# Patient Record
Sex: Male | Born: 1950 | Race: White | Hispanic: No | Marital: Single | Smoking: Former smoker
Health system: Southern US, Community
[De-identification: ages and names within clinical notes are randomized; demographics above are authoritative.]

## PROBLEM LIST (undated history)

## (undated) DIAGNOSIS — I96 Gangrene, not elsewhere classified: Secondary | ICD-10-CM

## (undated) DIAGNOSIS — E119 Type 2 diabetes mellitus without complications: Secondary | ICD-10-CM

## (undated) HISTORY — PX: KNEE SURGERY: SHX244

---

## 2000-03-08 ENCOUNTER — Emergency Department (HOSPITAL_COMMUNITY): Admission: EM | Admit: 2000-03-08 | Discharge: 2000-03-08 | Payer: Self-pay | Admitting: *Deleted

## 2015-01-23 ENCOUNTER — Emergency Department (HOSPITAL_COMMUNITY): Payer: PPO

## 2015-01-23 ENCOUNTER — Inpatient Hospital Stay (HOSPITAL_COMMUNITY): Payer: PPO

## 2015-01-23 ENCOUNTER — Inpatient Hospital Stay (HOSPITAL_COMMUNITY)
Admission: EM | Admit: 2015-01-23 | Discharge: 2015-02-15 | DRG: 216 | Disposition: A | Discharge status: Skilled Nursing Facility | Payer: PPO | Attending: Cardiothoracic Surgery | Admitting: Cardiothoracic Surgery

## 2015-01-23 ENCOUNTER — Encounter (HOSPITAL_COMMUNITY): Payer: Self-pay | Admitting: Pulmonary Disease

## 2015-01-23 DIAGNOSIS — J9589 Other postprocedural complications and disorders of respiratory system, not elsewhere classified: Secondary | ICD-10-CM | POA: Diagnosis not present

## 2015-01-23 DIAGNOSIS — E1165 Type 2 diabetes mellitus with hyperglycemia: Secondary | ICD-10-CM | POA: Diagnosis present

## 2015-01-23 DIAGNOSIS — I110 Hypertensive heart disease with heart failure: Secondary | ICD-10-CM | POA: Diagnosis present

## 2015-01-23 DIAGNOSIS — E119 Type 2 diabetes mellitus without complications: Secondary | ICD-10-CM | POA: Diagnosis not present

## 2015-01-23 DIAGNOSIS — R0602 Shortness of breath: Secondary | ICD-10-CM | POA: Diagnosis not present

## 2015-01-23 DIAGNOSIS — J9601 Acute respiratory failure with hypoxia: Secondary | ICD-10-CM | POA: Diagnosis present

## 2015-01-23 DIAGNOSIS — I255 Ischemic cardiomyopathy: Secondary | ICD-10-CM | POA: Diagnosis present

## 2015-01-23 DIAGNOSIS — Z951 Presence of aortocoronary bypass graft: Secondary | ICD-10-CM

## 2015-01-23 DIAGNOSIS — Z9119 Patient's noncompliance with other medical treatment and regimen: Secondary | ICD-10-CM | POA: Diagnosis not present

## 2015-01-23 DIAGNOSIS — Z87891 Personal history of nicotine dependence: Secondary | ICD-10-CM

## 2015-01-23 DIAGNOSIS — I509 Heart failure, unspecified: Secondary | ICD-10-CM

## 2015-01-23 DIAGNOSIS — I2510 Atherosclerotic heart disease of native coronary artery without angina pectoris: Secondary | ICD-10-CM | POA: Diagnosis present

## 2015-01-23 DIAGNOSIS — E872 Acidosis: Secondary | ICD-10-CM | POA: Diagnosis present

## 2015-01-23 DIAGNOSIS — R278 Other lack of coordination: Secondary | ICD-10-CM | POA: Diagnosis not present

## 2015-01-23 DIAGNOSIS — I130 Hypertensive heart and chronic kidney disease with heart failure and stage 1 through stage 4 chronic kidney disease, or unspecified chronic kidney disease: Principal | ICD-10-CM | POA: Diagnosis present

## 2015-01-23 DIAGNOSIS — E876 Hypokalemia: Secondary | ICD-10-CM | POA: Diagnosis present

## 2015-01-23 DIAGNOSIS — R339 Retention of urine, unspecified: Secondary | ICD-10-CM | POA: Diagnosis present

## 2015-01-23 DIAGNOSIS — R2681 Unsteadiness on feet: Secondary | ICD-10-CM | POA: Diagnosis not present

## 2015-01-23 DIAGNOSIS — J9602 Acute respiratory failure with hypercapnia: Secondary | ICD-10-CM | POA: Diagnosis not present

## 2015-01-23 DIAGNOSIS — J9811 Atelectasis: Secondary | ICD-10-CM | POA: Diagnosis not present

## 2015-01-23 DIAGNOSIS — I213 ST elevation (STEMI) myocardial infarction of unspecified site: Secondary | ICD-10-CM | POA: Diagnosis not present

## 2015-01-23 DIAGNOSIS — L97519 Non-pressure chronic ulcer of other part of right foot with unspecified severity: Secondary | ICD-10-CM | POA: Diagnosis present

## 2015-01-23 DIAGNOSIS — I5023 Acute on chronic systolic (congestive) heart failure: Secondary | ICD-10-CM | POA: Diagnosis present

## 2015-01-23 DIAGNOSIS — E1121 Type 2 diabetes mellitus with diabetic nephropathy: Secondary | ICD-10-CM | POA: Diagnosis present

## 2015-01-23 DIAGNOSIS — I214 Non-ST elevation (NSTEMI) myocardial infarction: Secondary | ICD-10-CM | POA: Diagnosis present

## 2015-01-23 DIAGNOSIS — E785 Hyperlipidemia, unspecified: Secondary | ICD-10-CM | POA: Diagnosis present

## 2015-01-23 DIAGNOSIS — E1122 Type 2 diabetes mellitus with diabetic chronic kidney disease: Secondary | ICD-10-CM | POA: Diagnosis present

## 2015-01-23 DIAGNOSIS — I10 Essential (primary) hypertension: Secondary | ICD-10-CM

## 2015-01-23 DIAGNOSIS — J810 Acute pulmonary edema: Secondary | ICD-10-CM

## 2015-01-23 DIAGNOSIS — R52 Pain, unspecified: Secondary | ICD-10-CM

## 2015-01-23 DIAGNOSIS — J9600 Acute respiratory failure, unspecified whether with hypoxia or hypercapnia: Secondary | ICD-10-CM | POA: Diagnosis not present

## 2015-01-23 DIAGNOSIS — I513 Intracardiac thrombosis, not elsewhere classified: Secondary | ICD-10-CM | POA: Diagnosis present

## 2015-01-23 DIAGNOSIS — M6281 Muscle weakness (generalized): Secondary | ICD-10-CM | POA: Diagnosis not present

## 2015-01-23 DIAGNOSIS — E11621 Type 2 diabetes mellitus with foot ulcer: Secondary | ICD-10-CM | POA: Diagnosis present

## 2015-01-23 DIAGNOSIS — E0821 Diabetes mellitus due to underlying condition with diabetic nephropathy: Secondary | ICD-10-CM | POA: Diagnosis not present

## 2015-01-23 DIAGNOSIS — I739 Peripheral vascular disease, unspecified: Secondary | ICD-10-CM

## 2015-01-23 DIAGNOSIS — N181 Chronic kidney disease, stage 1: Secondary | ICD-10-CM

## 2015-01-23 DIAGNOSIS — D62 Acute posthemorrhagic anemia: Secondary | ICD-10-CM | POA: Diagnosis not present

## 2015-01-23 DIAGNOSIS — N189 Chronic kidney disease, unspecified: Secondary | ICD-10-CM | POA: Diagnosis present

## 2015-01-23 DIAGNOSIS — I080 Rheumatic disorders of both mitral and aortic valves: Secondary | ICD-10-CM | POA: Diagnosis not present

## 2015-01-23 DIAGNOSIS — E1142 Type 2 diabetes mellitus with diabetic polyneuropathy: Secondary | ICD-10-CM | POA: Diagnosis present

## 2015-01-23 DIAGNOSIS — I472 Ventricular tachycardia: Secondary | ICD-10-CM | POA: Diagnosis not present

## 2015-01-23 DIAGNOSIS — I25110 Atherosclerotic heart disease of native coronary artery with unstable angina pectoris: Secondary | ICD-10-CM | POA: Diagnosis not present

## 2015-01-23 DIAGNOSIS — M79661 Pain in right lower leg: Secondary | ICD-10-CM | POA: Diagnosis not present

## 2015-01-23 DIAGNOSIS — I4901 Ventricular fibrillation: Secondary | ICD-10-CM | POA: Diagnosis not present

## 2015-01-23 DIAGNOSIS — E11622 Type 2 diabetes mellitus with other skin ulcer: Secondary | ICD-10-CM | POA: Diagnosis present

## 2015-01-23 DIAGNOSIS — I5030 Unspecified diastolic (congestive) heart failure: Secondary | ICD-10-CM | POA: Diagnosis not present

## 2015-01-23 DIAGNOSIS — L97529 Non-pressure chronic ulcer of other part of left foot with unspecified severity: Secondary | ICD-10-CM | POA: Diagnosis present

## 2015-01-23 DIAGNOSIS — Z48812 Encounter for surgical aftercare following surgery on the circulatory system: Secondary | ICD-10-CM | POA: Diagnosis not present

## 2015-01-23 DIAGNOSIS — I209 Angina pectoris, unspecified: Secondary | ICD-10-CM | POA: Diagnosis not present

## 2015-01-23 DIAGNOSIS — I5021 Acute systolic (congestive) heart failure: Secondary | ICD-10-CM | POA: Diagnosis present

## 2015-01-23 DIAGNOSIS — R079 Chest pain, unspecified: Secondary | ICD-10-CM

## 2015-01-23 DIAGNOSIS — I519 Heart disease, unspecified: Secondary | ICD-10-CM | POA: Diagnosis not present

## 2015-01-23 DIAGNOSIS — L0390 Cellulitis, unspecified: Secondary | ICD-10-CM | POA: Diagnosis not present

## 2015-01-23 HISTORY — DX: Type 2 diabetes mellitus without complications: E11.9

## 2015-01-23 HISTORY — DX: Gangrene, not elsewhere classified: I96

## 2015-01-23 LAB — URINALYSIS, ROUTINE W REFLEX MICROSCOPIC
BILIRUBIN URINE: NEGATIVE
HGB URINE DIPSTICK: NEGATIVE
KETONES UR: 15 mg/dL — AB
Leukocytes, UA: NEGATIVE
Nitrite: NEGATIVE
PROTEIN: NEGATIVE mg/dL
Specific Gravity, Urine: 1.03 (ref 1.005–1.030)
pH: 5 (ref 5.0–8.0)

## 2015-01-23 LAB — BASIC METABOLIC PANEL
Anion gap: 14 (ref 5–15)
BUN: 14 mg/dL (ref 6–20)
CO2: 20 mmol/L — ABNORMAL LOW (ref 22–32)
CREATININE: 1.35 mg/dL — AB (ref 0.61–1.24)
Calcium: 8.9 mg/dL (ref 8.9–10.3)
Chloride: 106 mmol/L (ref 101–111)
GFR calc Af Amer: >60 mL/min (ref >60)
GFR, EST NON AFRICAN AMERICAN: 53 mL/min — AB (ref >60)
GLUCOSE: 349 mg/dL — AB (ref 65–99)
POTASSIUM: 4.1 mmol/L (ref 3.5–5.1)
SODIUM: 140 mmol/L (ref 135–145)

## 2015-01-23 LAB — CBC WITH DIFFERENTIAL/PLATELET
BASOS PCT: 1 %
Basophils Absolute: 0.1 10*3/uL (ref 0.0–0.1)
EOS ABS: 0.2 10*3/uL (ref 0.0–0.7)
EOS PCT: 1 %
HCT: 45.1 % (ref 39.0–52.0)
HEMOGLOBIN: 15.4 g/dL (ref 13.0–17.0)
Lymphocytes Relative: 15 %
Lymphs Abs: 2 10*3/uL (ref 0.7–4.0)
MCH: 28.2 pg (ref 26.0–34.0)
MCHC: 34.1 g/dL (ref 30.0–36.0)
MCV: 82.4 fL (ref 78.0–100.0)
Monocytes Absolute: 0.5 10*3/uL (ref 0.1–1.0)
Monocytes Relative: 4 %
NEUTROS PCT: 79 %
Neutro Abs: 10.7 10*3/uL — ABNORMAL HIGH (ref 1.7–7.7)
PLATELETS: 254 10*3/uL (ref 150–400)
RBC: 5.47 MIL/uL (ref 4.22–5.81)
RDW: 13.4 % (ref 11.5–15.5)
WBC: 13.4 10*3/uL — AB (ref 4.0–10.5)

## 2015-01-23 LAB — URINE MICROSCOPIC-ADD ON

## 2015-01-23 LAB — HEPARIN LEVEL (UNFRACTIONATED)
HEPARIN UNFRACTIONATED: 0.79 [IU]/mL — AB (ref 0.30–0.70)
Heparin Unfractionated: 0.62 IU/mL (ref 0.30–0.70)

## 2015-01-23 LAB — MRSA PCR SCREENING: MRSA BY PCR: NEGATIVE

## 2015-01-23 LAB — COMPREHENSIVE METABOLIC PANEL WITH GFR
ALT: 18 U/L (ref 17–63)
AST: 21 U/L (ref 15–41)
Albumin: 3.9 g/dL (ref 3.5–5.0)
Alkaline Phosphatase: 72 U/L (ref 38–126)
Anion gap: 16 — ABNORMAL HIGH (ref 5–15)
BUN: 12 mg/dL (ref 6–20)
CO2: 19 mmol/L — ABNORMAL LOW (ref 22–32)
Calcium: 9.4 mg/dL (ref 8.9–10.3)
Chloride: 105 mmol/L (ref 101–111)
Creatinine, Ser: 1.38 mg/dL — ABNORMAL HIGH (ref 0.61–1.24)
GFR calc Af Amer: 60 mL/min — ABNORMAL LOW
GFR calc non Af Amer: 52 mL/min — ABNORMAL LOW
Glucose, Bld: 428 mg/dL — ABNORMAL HIGH (ref 65–99)
Potassium: 3.5 mmol/L (ref 3.5–5.1)
Sodium: 140 mmol/L (ref 135–145)
Total Bilirubin: 0.8 mg/dL (ref 0.3–1.2)
Total Protein: 7 g/dL (ref 6.5–8.1)

## 2015-01-23 LAB — CBC
HCT: 37.9 % — ABNORMAL LOW (ref 39.0–52.0)
HEMOGLOBIN: 13.3 g/dL (ref 13.0–17.0)
MCH: 28.3 pg (ref 26.0–34.0)
MCHC: 35.1 g/dL (ref 30.0–36.0)
MCV: 80.6 fL (ref 78.0–100.0)
PLATELETS: 234 10*3/uL (ref 150–400)
RBC: 4.7 MIL/uL (ref 4.22–5.81)
RDW: 13.2 % (ref 11.5–15.5)
WBC: 12.5 10*3/uL — AB (ref 4.0–10.5)

## 2015-01-23 LAB — TROPONIN I
Troponin I: 10.85 ng/mL (ref <0.031)
Troponin I: 28.64 ng/mL (ref <0.031)

## 2015-01-23 LAB — I-STAT CG4 LACTIC ACID, ED: LACTIC ACID, VENOUS: 5.34 mmol/L — AB (ref 0.5–2.0)

## 2015-01-23 LAB — I-STAT ARTERIAL BLOOD GAS, ED
ACID-BASE DEFICIT: 5 mmol/L — AB (ref 0.0–2.0)
Bicarbonate: 19.7 mEq/L — ABNORMAL LOW (ref 20.0–24.0)
O2 SAT: 100 %
PCO2 ART: 35.9 mmHg (ref 35.0–45.0)
PH ART: 7.348 — AB (ref 7.350–7.450)
PO2 ART: 304 mmHg — AB (ref 80.0–100.0)
Patient temperature: 98.6
TCO2: 21 mmol/L (ref 0–100)

## 2015-01-23 LAB — CREATININE, SERUM
CREATININE: 1.38 mg/dL — AB (ref 0.61–1.24)
GFR, EST AFRICAN AMERICAN: 60 mL/min — AB (ref >60)
GFR, EST NON AFRICAN AMERICAN: 52 mL/min — AB (ref >60)

## 2015-01-23 LAB — LACTIC ACID, PLASMA: LACTIC ACID, VENOUS: 3.1 mmol/L — AB (ref 0.5–2.0)

## 2015-01-23 LAB — GLUCOSE, CAPILLARY
GLUCOSE-CAPILLARY: 309 mg/dL — AB (ref 65–99)
GLUCOSE-CAPILLARY: 203 mg/dL — AB (ref 65–99)

## 2015-01-23 LAB — CBG MONITORING, ED
GLUCOSE-CAPILLARY: 393 mg/dL — AB (ref 65–99)
GLUCOSE-CAPILLARY: 347 mg/dL — AB (ref 65–99)

## 2015-01-23 LAB — I-STAT TROPONIN, ED: TROPONIN I, POC: 0.25 ng/mL — AB (ref 0.00–0.08)

## 2015-01-23 LAB — BRAIN NATRIURETIC PEPTIDE: B NATRIURETIC PEPTIDE 5: 395.7 pg/mL — AB (ref 0.0–100.0)

## 2015-01-23 LAB — TSH: TSH: 1.311 u[IU]/mL (ref 0.350–4.500)

## 2015-01-23 MED ORDER — SODIUM CHLORIDE 0.9 % IV SOLN: 250.0000 mL | INTRAVENOUS | Status: DC | PRN

## 2015-01-23 MED ORDER — ONDANSETRON HCL 4 MG/2ML IJ SOLN
4.0000 mg | Freq: Four times a day (QID) | INTRAMUSCULAR | Status: DC | PRN
  Filled 2015-01-23: qty 2

## 2015-01-23 MED ORDER — SODIUM CHLORIDE 0.9 % IV BOLUS (SEPSIS)
1000.0000 mL | Freq: Once | INTRAVENOUS | Status: AC
  Administered 2015-01-23: 1000 mL via INTRAVENOUS

## 2015-01-23 MED ORDER — NITROGLYCERIN IN D5W 200-5 MCG/ML-% IV SOLN
0.0000 ug/min | Freq: Once | INTRAVENOUS | Status: DC
Start: 2015-01-23 — End: 2015-01-23

## 2015-01-23 MED ORDER — IOHEXOL 350 MG/ML SOLN: 80.0000 mL | Freq: Once | INTRAVENOUS | Status: DC | PRN

## 2015-01-23 MED ORDER — DEXTROSE-NACL 5-0.45 % IV SOLN: INTRAVENOUS | Status: DC

## 2015-01-23 MED ORDER — ALBUTEROL SULFATE (2.5 MG/3ML) 0.083% IN NEBU
INHALATION_SOLUTION | RESPIRATORY_TRACT | Status: AC
  Administered 2015-01-23: 09:00:00
  Filled 2015-01-23: qty 6

## 2015-01-23 MED ORDER — ASPIRIN 81 MG PO CHEW: 324.0000 mg | CHEWABLE_TABLET | Freq: Once | ORAL | Status: DC

## 2015-01-23 MED ORDER — ASPIRIN 300 MG RE SUPP
300.0000 mg | Freq: Once | RECTAL | Status: DC
  Filled 2015-01-23: qty 1

## 2015-01-23 MED ORDER — METOPROLOL TARTRATE 25 MG PO TABS
25.0000 mg | ORAL_TABLET | Freq: Two times a day (BID) | ORAL | Status: DC
  Administered 2015-01-23 – 2015-01-27 (×9): 25 mg via ORAL
  Filled 2015-01-23 (×9): qty 1

## 2015-01-23 MED ORDER — HYDRALAZINE HCL 20 MG/ML IJ SOLN: 10.0000 mg | INTRAMUSCULAR | Status: DC | PRN

## 2015-01-23 MED ORDER — ASPIRIN 81 MG PO CHEW
324.0000 mg | CHEWABLE_TABLET | Freq: Every day | ORAL | Status: DC
  Administered 2015-01-24: 324 mg via ORAL
  Filled 2015-01-23: qty 4

## 2015-01-23 MED ORDER — FUROSEMIDE 10 MG/ML IJ SOLN
60.0000 mg | Freq: Four times a day (QID) | INTRAMUSCULAR | Status: AC
  Administered 2015-01-23 – 2015-01-24 (×2): 60 mg via INTRAVENOUS
  Filled 2015-01-23 (×2): qty 6

## 2015-01-23 MED ORDER — SODIUM CHLORIDE 0.9 % IV BOLUS (SEPSIS): 1000.0000 mL | Freq: Once | INTRAVENOUS | Status: DC

## 2015-01-23 MED ORDER — INSULIN ASPART 100 UNIT/ML ~~LOC~~ SOLN
10.0000 [IU] | Freq: Once | SUBCUTANEOUS | Status: AC
  Administered 2015-01-23: 10 [IU] via INTRAVENOUS
  Filled 2015-01-23: qty 1

## 2015-01-23 MED ORDER — NITROGLYCERIN 2 % TD OINT
0.5000 [in_us] | TOPICAL_OINTMENT | Freq: Once | TRANSDERMAL | Status: AC
  Administered 2015-01-23: 0.5 [in_us] via TOPICAL
  Filled 2015-01-23: qty 1

## 2015-01-23 MED ORDER — ACETAMINOPHEN 325 MG PO TABS: 650.0000 mg | ORAL_TABLET | ORAL | Status: DC | PRN

## 2015-01-23 MED ORDER — SODIUM CHLORIDE 0.9 % IV SOLN
INTRAVENOUS | Status: DC
  Filled 2015-01-23: qty 2.5

## 2015-01-23 MED ORDER — SODIUM CHLORIDE 0.9 % IV BOLUS (SEPSIS)
500.0000 mL | Freq: Once | INTRAVENOUS | Status: AC
  Administered 2015-01-23: 500 mL via INTRAVENOUS

## 2015-01-23 MED ORDER — HEPARIN BOLUS VIA INFUSION
5000.0000 [IU] | Freq: Once | INTRAVENOUS | Status: AC
  Administered 2015-01-23: 5000 [IU] via INTRAVENOUS
  Filled 2015-01-23: qty 5000

## 2015-01-23 MED ORDER — CETYLPYRIDINIUM CHLORIDE 0.05 % MT LIQD
7.0000 mL | Freq: Two times a day (BID) | OROMUCOSAL | Status: DC
  Administered 2015-01-24 – 2015-01-31 (×11): 7 mL via OROMUCOSAL

## 2015-01-23 MED ORDER — VANCOMYCIN HCL 10 G IV SOLR
2000.0000 mg | Freq: Once | INTRAVENOUS | Status: DC
  Administered 2015-01-23: 2000 mg via INTRAVENOUS
  Filled 2015-01-23: qty 2000

## 2015-01-23 MED ORDER — HEPARIN (PORCINE) IN NACL 100-0.45 UNIT/ML-% IJ SOLN
1550.0000 [IU]/h | INTRAMUSCULAR | Status: DC
  Administered 2015-01-23: 1500 [IU]/h via INTRAVENOUS
  Administered 2015-01-23 – 2015-01-24 (×2): 1350 [IU]/h via INTRAVENOUS
  Filled 2015-01-23 (×3): qty 250

## 2015-01-23 MED ORDER — ASPIRIN 300 MG RE SUPP: 300.0000 mg | Freq: Every day | RECTAL | Status: DC

## 2015-01-23 MED ORDER — FUROSEMIDE 10 MG/ML IJ SOLN
60.0000 mg | Freq: Once | INTRAMUSCULAR | Status: AC
  Administered 2015-01-23: 60 mg via INTRAVENOUS
  Filled 2015-01-23: qty 6

## 2015-01-23 MED ORDER — PIPERACILLIN-TAZOBACTAM 3.375 G IVPB 30 MIN
3.3750 g | Freq: Once | INTRAVENOUS | Status: AC
  Administered 2015-01-23: 3.375 g via INTRAVENOUS
  Filled 2015-01-23: qty 50

## 2015-01-23 MED ORDER — INSULIN ASPART 100 UNIT/ML ~~LOC~~ SOLN
0.0000 [IU] | SUBCUTANEOUS | Status: DC
  Administered 2015-01-23: 5 [IU] via SUBCUTANEOUS
  Administered 2015-01-23: 11 [IU] via SUBCUTANEOUS
  Administered 2015-01-24: 2 [IU] via SUBCUTANEOUS
  Administered 2015-01-24 (×4): 3 [IU] via SUBCUTANEOUS
  Administered 2015-01-24: 8 [IU] via SUBCUTANEOUS
  Administered 2015-01-25: 3 [IU] via SUBCUTANEOUS
  Administered 2015-01-25: 5 [IU] via SUBCUTANEOUS
  Administered 2015-01-25 – 2015-01-26 (×4): 2 [IU] via SUBCUTANEOUS
  Administered 2015-01-26: 5 [IU] via SUBCUTANEOUS
  Administered 2015-01-26 (×2): 3 [IU] via SUBCUTANEOUS
  Administered 2015-01-26: 5 [IU] via SUBCUTANEOUS
  Administered 2015-01-27 – 2015-01-28 (×6): 3 [IU] via SUBCUTANEOUS
  Administered 2015-01-29: 8 [IU] via SUBCUTANEOUS
  Administered 2015-01-29 (×3): 3 [IU] via SUBCUTANEOUS
  Administered 2015-01-30: 5 [IU] via SUBCUTANEOUS
  Administered 2015-01-30: 2 [IU] via SUBCUTANEOUS
  Administered 2015-01-30: 3 [IU] via SUBCUTANEOUS
  Administered 2015-01-30: 2 [IU] via SUBCUTANEOUS
  Administered 2015-01-30 – 2015-01-31 (×2): 3 [IU] via SUBCUTANEOUS
  Administered 2015-01-31: 5 [IU] via SUBCUTANEOUS
  Administered 2015-01-31 (×2): 2 [IU] via SUBCUTANEOUS
  Administered 2015-01-31: 3 [IU] via SUBCUTANEOUS

## 2015-01-23 MED ORDER — ENOXAPARIN SODIUM 40 MG/0.4ML ~~LOC~~ SOLN
40.0000 mg | SUBCUTANEOUS | Status: DC
Start: 2015-01-23 — End: 2015-01-23

## 2015-01-23 MED ORDER — ATORVASTATIN CALCIUM 80 MG PO TABS
80.0000 mg | ORAL_TABLET | Freq: Every day | ORAL | Status: DC
  Administered 2015-01-24 – 2015-02-14 (×20): 80 mg via ORAL
  Filled 2015-01-23 (×21): qty 1

## 2015-01-23 NOTE — Progress Notes (Signed)
  Echocardiogram 2D Echocardiogram has been performed.  Bradley Benitez, Bradley Benitez R 01/23/2015, 2:00 PM

## 2015-01-23 NOTE — Progress Notes (Addendum)
ANTICOAGULATION CONSULT NOTE - Follow Up Consult  Pharmacy Consult for Heparin Indication: rule out PE  Allergies  Allergen Reactions  . Codeine Other (See Comments)    intolerance    Patient Measurements: Height: 6\' 1"  (185.4 cm) Weight: 227 lb 11.8 oz (103.3 kg) IBW/kg (Calculated) : 79.9 Heparin Dosing Weight: ~100kg  Vital Signs: Temp: 97.4 F (36.3 C) (12/28 1300) Temp Source: Oral (12/28 1300) BP: 123/76 mmHg (12/28 1500) Pulse Rate: 113 (12/28 1500)  Labs:  Recent Labs  01/23/15 0842 01/23/15 1410 01/23/15 1545  HGB 15.4 13.3  --   HCT 45.1 37.9*  --   PLT 254 234  --   HEPARINUNFRC  --   --  0.62  CREATININE 1.38* 1.38*  --   TROPONINI  --  10.85*  --     Estimated Creatinine Clearance: 66.5 mL/min (by C-G formula based on Cr of 1.38).   Medications:  Heparin @ 1500 units/hr  Assessment: 64yom started on IV heparin earlier today for possible PE. Initial heparin level is therapeutic at 0.62. No bleeding reported.  Goal of Therapy:  Heparin level 0.3-0.7 units/ml Monitor platelets by anticoagulation protocol: Yes   Plan:  1) Continue heparin at 1500 units/hr 2) Check 6 hour heparin level to confirm  [name], Arria Naim Sue 01/23/2015,4:43 PM   Addendum: Confirmatory heparin level is above goal at 0.79. Decrease heparin to 1350 units/hr and follow up heparin level in 6 hours.  [name], Leelah Hanna Sue 01/23/2015, 10:30 PM

## 2015-01-23 NOTE — Progress Notes (Signed)
RT transported pt to 2H04 from ED. No complications. Vital signs stable. Patient tolerated well. Report to RT given. RN at bedside. RT will continue to monitor.

## 2015-01-23 NOTE — ED Notes (Signed)
Resp distress- x2 two weeks worsening this morning 4am. EMS reports pt lungs diminished. O2 90% on 4L [place]. Pt profusely diaphoretic. Pt resp status worseded- CPAP= did not tolerate. Placed pt on nonrebreather. Denies CP. HR 130, unable to obtain BP, resp 30. 85% on nonrebreather

## 2015-01-23 NOTE — Progress Notes (Signed)
Utilization Review Completed.Kamalani Mastro T12/28/2016  

## 2015-01-23 NOTE — ED Provider Notes (Signed)
CSN: [id number]     Arrival date & time 01/23/15  0831 History   First MD Initiated Contact with Patient 01/23/15 [phone]     Chief Complaint  Patient presents with  . Shortness of Breath     (Consider location/radiation/quality/duration/timing/severity/associated sxs/prior Treatment) Patient is a 64 y.o. male presenting with shortness of breath. The history is provided by the patient.  Shortness of Breath Severity:  Severe Onset quality:  Gradual Duration:  2 weeks Timing:  Constant Progression:  Worsening Chronicity:  New Context: activity   Relieved by:  Nothing Worsened by:  Nothing tried Ineffective treatments:  None tried Associated symptoms: cough   Associated symptoms: no chest pain and no fever   Associated symptoms comment:  Leg swelling Risk factors comment:  No prior healthcare recently   Past Medical History  Diagnosis Date  . DM2 (diabetes mellitus, type 2) (HCC)    No past surgical history on file. No family history on file. Social History  Substance Use Topics  . Smoking status: Not on file  . Smokeless tobacco: Not on file  . Alcohol Use: Not on file    Review of Systems  Constitutional: Negative for fever.  Respiratory: Positive for cough and shortness of breath.   Cardiovascular: Negative for chest pain.  All other systems reviewed and are negative.     Allergies  Codeine  Home Medications   Prior to Admission medications   Medication Sig Start Date End Date Taking? Authorizing Provider  aspirin 325 MG tablet Take 325 mg by mouth every 6 (six) hours as needed for mild pain.   Yes Historical Provider, MD   BP 158/98 mmHg  Pulse 121  Temp(Src) 97.7 F (36.5 C) (Rectal)  Resp 22  Wt 234 lb 6.4 oz (106.323 kg)  SpO2 100% Physical Exam  Constitutional: He is oriented to person, place, and time. He appears well-developed. He appears toxic. He appears distressed. Face mask in place.  HENT:  Head: Normocephalic and atraumatic.  Eyes:  Conjunctivae are normal.  Neck: Neck supple. No tracheal deviation present.  Cardiovascular: Regular rhythm and normal heart sounds.  Tachycardia present.   4+ b/l LE pitting edema  Pulmonary/Chest: Accessory muscle usage present. Tachypnea noted. He is in respiratory distress. He has rales.  Abdominal: Soft. He exhibits no distension.  Neurological: He is alert and oriented to person, place, and time.  Skin: Skin is warm. He is diaphoretic.  Psychiatric: He has a normal mood and affect.    ED Course  Procedures (including critical care time)  CRITICAL CARE Performed by: Lyndal Pulley Total critical care time: 30 minutes Critical care time was exclusive of separately billable procedures and treating other patients. Critical care was necessary to treat or prevent imminent or life-threatening deterioration. Critical care was time spent personally by me on the following activities: development of treatment plan with patient and/or surrogate as well as nursing, discussions with consultants, evaluation of patient's response to treatment, examination of patient, obtaining history from patient or surrogate, ordering and performing treatments and interventions, ordering and review of laboratory studies, ordering and review of radiographic studies, pulse oximetry and re-evaluation of patient's condition.  Emergency Focused Ultrasound Exam Limited Ultrasound of Lower Extremity for DVT  Performed and interpreted by Dr. Clydene Pugh Indication: Leg swelling Transverse views of bilaterl lower extremities are obtained in real time for the purposes of evaluation for deep venous thrombosis.  Findings: collapsible proximal femoral vein,  collapsible popliteal vein bilaterally Interpretation: no deep venous thrombosis Images  archived electronically.  CPT Code:   [id number]  Emergency Focused Ultrasound Exam Limited Ultrasound of the Heart and Pericardium  Performed and interpreted by Dr. Clydene PughKnott Indication:  shortness of breath Multiple views of the heart, pericardium, and IVC are obtained with a multi frequency probe.  Findings: decreased contractility, small anechoic fluid, no IVC collapse Interpretation: depressed ejection fraction, small pericardial effusion, elevated CVP Images archived electronically.  CPT Code: [id number]  Labs Review Labs Reviewed  CBC WITH DIFFERENTIAL/PLATELET - Abnormal; Notable for the following:    WBC 13.4 (*)    Neutro Abs 10.7 (*)    All other components within normal limits  BRAIN NATRIURETIC PEPTIDE - Abnormal; Notable for the following:    B Natriuretic Peptide 395.7 (*)    All other components within normal limits  COMPREHENSIVE METABOLIC PANEL - Abnormal; Notable for the following:    CO2 19 (*)    Glucose, Bld 428 (*)    Creatinine, Ser 1.38 (*)    GFR calc non Af Amer 52 (*)    GFR calc Af Amer 60 (*)    Anion gap 16 (*)    All other components within normal limits  URINALYSIS, ROUTINE W REFLEX MICROSCOPIC (NOT AT [facility]) - Abnormal; Notable for the following:    Glucose, UA >1000 (*)    Ketones, ur 15 (*)    All other components within normal limits  URINE MICROSCOPIC-ADD ON - Abnormal; Notable for the following:    Squamous Epithelial / LPF 0-5 (*)    Bacteria, UA RARE (*)    Casts HYALINE CASTS (*)    All other components within normal limits  I-STAT TROPOININ, ED - Abnormal; Notable for the following:    Troponin i, poc 0.25 (*)    All other components within normal limits  I-STAT ARTERIAL BLOOD GAS, ED - Abnormal; Notable for the following:    pH, Arterial 7.348 (*)    pO2, Arterial 304.0 (*)    Bicarbonate 19.7 (*)    Acid-base deficit 5.0 (*)    All other components within normal limits  I-STAT CG4 LACTIC ACID, ED - Abnormal; Notable for the following:    Lactic Acid, Venous 5.34 (*)    All other components within normal limits  CBG MONITORING, ED - Abnormal; Notable for the following:    Glucose-Capillary 393 (*)    All other  components within normal limits  URINE CULTURE  CULTURE, BLOOD (ROUTINE X 2)  CULTURE, BLOOD (ROUTINE X 2)  HEPARIN LEVEL (UNFRACTIONATED)    Imaging Review Dg Chest Port 1 View  01/23/2015  CLINICAL DATA:  Congestive heart failure EXAM: PORTABLE CHEST 1 VIEW COMPARISON:  None. FINDINGS: There is generalized interstitial edema with patchy alveolar edema in the bases. Heart is enlarged with pulmonary venous hypertension. No adenopathy. IMPRESSION: Evidence of congestive heart failure with edema most pronounced in the lung bases. Electronically Signed   By: Bretta BangWilliam  Woodruff III M.D.   On: 01/23/2015 09:34   I have personally reviewed and evaluated these images and lab results as part of my medical decision-making.   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 He was hypoxemic on arrival of EMS. Would not tolerate BiPAP, came in profusely diaphoretic and laboring to breathe on nonrebreather mask. Placed on BiPAP here with improvement. Patient did not have refractory hypotension after being placed on BiPAP, persistent tachycardia and shortness of breath with improvement of his work of breathing with mechanical assistance. Patient is acidotic, has elevated lactic acid, elevated BNP and troponin. No acute ischemic findings on EKG. Placed on aspirin and heparin for possible ACS. Patient does  have bilateral lower extremity swelling and 4+ edema concerning for congestive heart failure versus thrombotic event. Considered pulmonary embolus and CT scan ordered for pulmonary embolus evaluation. Patient does have hyperglycemia, ketones in urine, anion gap, metabolic acidosis, cannot rule out mild DKA so started on an insulin infusion to help slowly correct glucose.  BNP is elevated and chest x-ray demonstrates bibasilar interstitial edema that is concerning for atypical infection versus congestive heart failure. Provided empiric antibiotics with elevation of white blood cell count and tachycardia. Discussed case with hospitalist for stepdown admission with patient requiring ongoing BiPAP and they requested critical care consultation. Critical care saw patient in the emergency department and will admit for further workup and monitoring.   Lyndal Pulley, MD 01/23/15 [phone]

## 2015-01-23 NOTE — ED Notes (Signed)
CTA canceled per verbal order CCMD
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
## 2015-01-23 NOTE — Progress Notes (Signed)
RT contacted MD concerning Bipap leak due to pt's beard. MD requested to try high flow nasal cannula.

## 2015-01-23 NOTE — H&P (Addendum)
PULMONARY / CRITICAL CARE MEDICINE   Name: Bradley PilaDavid M Benitez MRN: [id number] DOB: November 23, 1950    ADMISSION DATE:  01/23/2015 CONSULTATION DATE:  01/23/2015  REFERRING MD:  Clydene PughKnott  CHIEF COMPLAINT:  Dyspnea  HISTORY OF PRESENT ILLNESS:   64 y/o male with diabetes but currently does not go to the doctor or take any medications [facility] on 01/23/2015 complaining of shortness of breath. He states that for the last 2 weeks she's been having increasing dyspnea on exertion and orthopnea. He notes chronic leg swelling which she does not think has changed significantly in the last few weeks. 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 He currently takes no medications. He said that he had a cough the morning of 01/23/2015 which was productive of a milky appearing mucus. He denies fevers, chills, or sick contacts. He does note having some chest tightness in the last 2 weeks as well which was associated with his shortness of breath. In the emergency department he was noted to be profoundly hypoxemic, requiring BiPAP for respiratory assistance, he had an elevated lactic acid. He was given antibiotics for presumed sepsis.  PAST MEDICAL HISTORY :  He  has a past medical history of DM2 (diabetes mellitus, type 2) (HCC).  PAST SURGICAL HISTORY: He  has no past surgical history on file.  Allergies  Allergen Reactions  . Codeine Other (See Comments)    intolerance    No current facility-administered medications on file prior to encounter.   No current outpatient prescriptions on file prior to encounter.    FAMILY HISTORY:  His has no family status information on file.   SOCIAL HISTORY: He  reports that he quit smoking about 21 years ago. His smoking use included Cigarettes. He has a 60 pack-year smoking history. He does not have any smokeless tobacco history on file. He reports that he uses  illicit drugs. He reports that he does not drink alcohol.  REVIEW OF SYSTEMS:   Gen: Denies fever, chills, weight change, fatigue, night sweats HEENT: Denies blurred vision, double vision, hearing loss, tinnitus, sinus congestion, rhinorrhea, sore throat, neck stiffness, dysphagia PULM: per HPI CV: per HPI GI: Denies abdominal pain, nausea, vomiting, diarrhea, hematochezia, melena, constipation, change in bowel habits GU: Denies dysuria, hematuria, polyuria, oliguria, urethral discharge Endocrine: Denies hot or cold intolerance, polyuria, polyphagia or appetite change Derm: Denies rash, dry skin, scaling or peeling skin change Heme: Denies easy bruising, bleeding, bleeding gums Neuro: Denies headache, numbness, weakness, slurred speech, loss of memory or consciousness   SUBJECTIVE:  As above  VITAL SIGNS: BP 158/98 mmHg  Pulse 121  Temp(Src) 97.7 F (36.5 C) (Rectal)  Resp 22  Wt 106.323 kg (234 lb 6.4 oz)  SpO2 100%  HEMODYNAMICS:    VENTILATOR SETTINGS: Vent Mode:  [-] BIPAP;PCV FiO2 (%):  [40 %] 40 % Set Rate:  [10 bmp] 10 bmp PEEP:  [5 cmH20] 5 cmH20  INTAKE / OUTPUT:    PHYSICAL EXAMINATION: General:  Chronically ill-appearing, mild respiratory distress Neuro:  Awake alert, oriented 4, moves all 4 extremities HEENT:  Normocephalic atraumatic, oropharynx clear, extraocular movements intact Cardiovascular:  Tachycardic, regular rate and rhythm, S3 noted, JVD noted Lungs:  Rales in bases, increased respiratory effort, no wheezing Abdomen:  Bowel sounds positive, soft, nontender Musculoskeletal:  Normal bulk and tone Skin:  Pitting edema to the knees, cool to touch, mildly cyanotic in the  toes, chronic ulcerations noted in both feet  LABS:  BMET  Recent Labs Lab 01/23/15 0842  NA 140  K 3.5  CL 105  CO2 19*  BUN 12  CREATININE 1.38*  GLUCOSE 428*    Electrolytes  Recent Labs Lab 01/23/15 0842  CALCIUM 9.4    CBC  Recent Labs Lab  01/23/15 0842  WBC 13.4*  HGB 15.4  HCT 45.1  PLT 254    Coag's No results for input(s): APTT, INR in the last 168 hours.  Sepsis Markers  Recent Labs Lab 01/23/15 0919  LATICACIDVEN 5.34*    ABG  Recent Labs Lab 01/23/15 0905  PHART 7.348*  PCO2ART 35.9  PO2ART 304.0*    Liver Enzymes  Recent Labs Lab 01/23/15 0842  AST 21  ALT 18  ALKPHOS 72  BILITOT 0.8  ALBUMIN 3.9    Cardiac Enzymes No results for input(s): TROPONINI, PROBNP in the last 168 hours.  Glucose  Recent Labs Lab 01/23/15 1106  GLUCAP 393*    Imaging Dg Chest Port 1 View  01/23/2015  CLINICAL DATA:  Congestive heart failure EXAM: PORTABLE CHEST 1 VIEW COMPARISON:  None. FINDINGS: There is generalized interstitial edema with patchy alveolar edema in the bases. Heart is enlarged with pulmonary venous hypertension. No adenopathy. IMPRESSION: Evidence of congestive heart failure with edema most pronounced in the lung bases. Electronically Signed   By: Bretta Bang III M.D.   On: 01/23/2015 09:34     STUDIES:  01/23/2015 echocardiogram  CULTURES: 01/23/2015 blood culture 01/23/2015 urine culture   ANTIBIOTICS: 01/23/2015 vancomycin 1 01/23/2015 Zosyn 1  SIGNIFICANT EVENTS: 01/23/2015 admission to [facility], BiPAP, cardiology consult  LINES/TUBES: 01/23/2015 BiPAP  DISCUSSION: This is a 64 year old male with a past medical history significant for diabetes mellitus he takes no medications and does not go to the doctor's office who presents with acute hypoxemic respiratory failure in the setting of significant leg swelling, progressive dyspnea, and orthopnea. He had an elevated lactic acid in the emergency department which is likely representative of poor perfusion in the setting of congestive heart failure. The most likely explanation for this picture is congestive heart failure, less likely would be the consideration of an occult infection. At this time I see no  indication for antibiotics. He also has profound hyperglycemia which is uncontrolled secondary to noncompliance with medical therapy.  ASSESSMENT / PLAN:  PULMONARY A: Acute hypoxemic respiratory failure secondary to acute pulmonary edema in the setting of very likely congestive heart failure P:   BiPAP as needed Monitor O2 saturations, keep above 90% Diuresis now  CARDIOVASCULAR A:  Acute congestive heart failure, systolic function is unknown Hypertension, currently untreated Sinus tachycardia Chest pain > non-specific T wave changes on EKG, he is high risk for ischemia given smoking history, age, and diabetes P:  Aspirin now Continue heparin gtt for now Serial troponin, stat echocardiogram Telemetry monitoring Serial EKG Cardiology consult nail Lasix now When necessary hydralazine to maintain systolic blood pressure less than 160 Nitropaste to chest now, consider nitroglycerin drip  RENAL A:   Presumed chronic kidney disease, uncertain baseline P:   Monitor BMET and UOP Replace electrolytes as needed   GASTROINTESTINAL A:   No acute issues P:   Nothing by mouth while on BiPAP  HEMATOLOGIC A:   No acute issues  P:  Monitor for bleeding  INFECTIOUS A:   Given antibiotics in the emergency department, no clear infection based on my history and physical exam P:  Hold further antibiotics Monitor for fever  ENDOCRINE A:   Hyperglycemia, poorly controlled   P:   Check hemoglobin A1c 10 units IV insulin now Sliding scale insulin for now Check TSH  NEUROLOGIC A:   Neuropathic pain secondary to poorly controlled diabetes P:   Consider wound care consult for foot ulcerations    FAMILY  - Updates: Family (niece, sister in law) updated in ER   My cc time 45 minutes   Heber Odessa, MD [facility] Pager: [phone] Cell: [phone] After 3pm or if no response, call [phone]   01/23/2015, 12:02 PM

## 2015-01-23 NOTE — ED Notes (Signed)
Pt on Bipap- tolerating well

## 2015-01-23 NOTE — Progress Notes (Signed)
ANTICOAGULATION CONSULT NOTE - Initial Consult  Pharmacy Consult for heparin Indication: rule out PE  Allergies  Allergen Reactions  . Codeine     Patient Measurements: Patient unable to report weight, estimated to be ~100 kg Height estimated to be ~6'4"  Vital Signs: BP: 152/100 mmHg (12/28 0903) Pulse Rate: 126 (12/28 0903)  Labs:  Recent Labs  01/23/15 0842  HGB 15.4  HCT 45.1  PLT 254  CREATININE 1.38*    CrCl cannot be calculated (Unknown ideal weight.).   Medical History: No past medical history on file.  Assessment: [name] presents to ED in respiratory distress. Patient unable to report weight, estimated to be ~ 100 kg. Pharmacy consulted to begin heparin for possible pulmonary embolism. Patient reports no medications prior to admission.  CT pending. Patient currently on BiPAP. Will use lower end of dosing range due to patient's unconfirmed weight. Will follow heparin levels closely.  Goal of Therapy:  Heparin level 0.3-0.7 units/ml Monitor platelets by anticoagulation protocol: Yes   Plan:  Give 5000 units bolus x 1 Start heparin infusion at 1500 units/hr Check anti-Xa level in 6 hours and daily while on heparin Continue to monitor H&H and platelets  Bradley Benitez, PharmD. PGY-1 Pharmacy Resident Pager: [phone] 01/23/2015,10:11 AM

## 2015-01-24 DIAGNOSIS — J9601 Acute respiratory failure with hypoxia: Secondary | ICD-10-CM

## 2015-01-24 DIAGNOSIS — I214 Non-ST elevation (NSTEMI) myocardial infarction: Secondary | ICD-10-CM

## 2015-01-24 DIAGNOSIS — I110 Hypertensive heart disease with heart failure: Secondary | ICD-10-CM

## 2015-01-24 DIAGNOSIS — I5021 Acute systolic (congestive) heart failure: Secondary | ICD-10-CM

## 2015-01-24 DIAGNOSIS — I213 ST elevation (STEMI) myocardial infarction of unspecified site: Secondary | ICD-10-CM

## 2015-01-24 LAB — CBC
HEMATOCRIT: 36.6 % — AB (ref 39.0–52.0)
HEMOGLOBIN: 13.1 g/dL (ref 13.0–17.0)
MCH: 29 pg (ref 26.0–34.0)
MCHC: 35.8 g/dL (ref 30.0–36.0)
MCV: 81.2 fL (ref 78.0–100.0)
Platelets: 240 10*3/uL (ref 150–400)
RBC: 4.51 MIL/uL (ref 4.22–5.81)
RDW: 13.4 % (ref 11.5–15.5)
WBC: 13.9 10*3/uL — AB (ref 4.0–10.5)

## 2015-01-24 LAB — BASIC METABOLIC PANEL
ANION GAP: 10 (ref 5–15)
BUN: 15 mg/dL (ref 6–20)
CO2: 24 mmol/L (ref 22–32)
Calcium: 8.5 mg/dL — ABNORMAL LOW (ref 8.9–10.3)
Chloride: 107 mmol/L (ref 101–111)
Creatinine, Ser: 1.3 mg/dL — ABNORMAL HIGH (ref 0.61–1.24)
GFR calc Af Amer: >60 mL/min (ref >60)
GFR calc non Af Amer: 56 mL/min — ABNORMAL LOW (ref >60)
GLUCOSE: 159 mg/dL — AB (ref 65–99)
POTASSIUM: 3.3 mmol/L — AB (ref 3.5–5.1)
Sodium: 141 mmol/L (ref 135–145)

## 2015-01-24 LAB — GLUCOSE, CAPILLARY
GLUCOSE-CAPILLARY: 131 mg/dL — AB (ref 65–99)
GLUCOSE-CAPILLARY: 162 mg/dL — AB (ref 65–99)
GLUCOSE-CAPILLARY: 186 mg/dL — AB (ref 65–99)
GLUCOSE-CAPILLARY: 263 mg/dL — AB (ref 65–99)
Glucose-Capillary: 171 mg/dL — ABNORMAL HIGH (ref 65–99)
Glucose-Capillary: 160 mg/dL — ABNORMAL HIGH (ref 65–99)

## 2015-01-24 LAB — URINE CULTURE: CULTURE: NO GROWTH

## 2015-01-24 LAB — PHOSPHORUS: Phosphorus: 3.9 mg/dL (ref 2.5–4.6)

## 2015-01-24 LAB — HEPARIN LEVEL (UNFRACTIONATED): HEPARIN UNFRACTIONATED: 0.56 [IU]/mL (ref 0.30–0.70)

## 2015-01-24 LAB — LIPID PANEL
CHOLESTEROL: 157 mg/dL (ref 0–200)
HDL: 34 mg/dL — AB (ref >40)
LDL CALC: 110 mg/dL — AB (ref 0–99)
TRIGLYCERIDES: 66 mg/dL (ref <150)
Total CHOL/HDL Ratio: 4.6 RATIO
VLDL: 13 mg/dL (ref 0–40)

## 2015-01-24 LAB — HEMOGLOBIN A1C
Hgb A1c MFr Bld: 11.7 % — ABNORMAL HIGH (ref 4.8–5.6)
MEAN PLASMA GLUCOSE: 289 mg/dL

## 2015-01-24 LAB — TROPONIN I: Troponin I: 26.2 ng/mL (ref <0.031)

## 2015-01-24 LAB — BRAIN NATRIURETIC PEPTIDE: B Natriuretic Peptide: 909.6 pg/mL — ABNORMAL HIGH (ref 0.0–100.0)

## 2015-01-24 LAB — MAGNESIUM: Magnesium: 1.7 mg/dL (ref 1.7–2.4)

## 2015-01-24 MED ORDER — NITROGLYCERIN IN D5W 200-5 MCG/ML-% IV SOLN
2.0000 ug/min | INTRAVENOUS | Status: DC
  Administered 2015-01-24 – 2015-01-28 (×2): 5 ug/min via INTRAVENOUS
  Filled 2015-01-24 (×2): qty 250

## 2015-01-24 MED ORDER — TRAZODONE HCL 50 MG PO TABS
100.0000 mg | ORAL_TABLET | Freq: Every evening | ORAL | Status: DC | PRN
  Administered 2015-01-28 – 2015-01-31 (×2): 100 mg via ORAL
  Filled 2015-01-24 (×2): qty 2

## 2015-01-24 MED ORDER — LIVING WELL WITH DIABETES BOOK
Freq: Once | Status: AC
  Administered 2015-01-24: 15:00:00
  Filled 2015-01-24: qty 1

## 2015-01-24 MED ORDER — INSULIN GLARGINE 100 UNIT/ML ~~LOC~~ SOLN
10.0000 [IU] | Freq: Every day | SUBCUTANEOUS | Status: DC
  Administered 2015-01-24: 10 [IU] via SUBCUTANEOUS
  Filled 2015-01-24 (×2): qty 0.1

## 2015-01-24 MED ORDER — FUROSEMIDE 10 MG/ML IJ SOLN
40.0000 mg | Freq: Four times a day (QID) | INTRAMUSCULAR | Status: AC
  Administered 2015-01-24 (×2): 40 mg via INTRAVENOUS
  Filled 2015-01-24 (×2): qty 4

## 2015-01-24 MED ORDER — POTASSIUM CHLORIDE CRYS ER 20 MEQ PO TBCR
40.0000 meq | EXTENDED_RELEASE_TABLET | Freq: Once | ORAL | Status: AC
  Administered 2015-01-24: 40 meq via ORAL
  Filled 2015-01-24: qty 2

## 2015-01-24 NOTE — Progress Notes (Addendum)
Inpatient Diabetes Program Recommendations  AACE/ADA: New Consensus Statement on Inpatient Glycemic Control (2015)  Target Ranges:  Prepandial:   less than 140 mg/dL      Peak postprandial:   less than 180 mg/dL (1-2 hours)      Critically ill patients:  140 - 180 mg/dL   Review of Glycemic Control  Diabetes history: DM2 Outpatient Diabetes medications: None Current orders for Inpatient glycemic control: Lantus 10 units QD, Novolog moderate Q4H  Results for Oliver PilaOLDHAM, Cayleb M (MRN [id number]) as of 01/24/2015 13:20  Ref. Range 01/23/2015 14:10  Hemoglobin A1C Latest Ref Range: 4.8-5.6 % 11.7 (H)   Results for Oliver PilaOLDHAM, Abdou M (MRN [id number]) as of 01/24/2015 13:20  Ref. Range 01/23/2015 17:17 01/23/2015 20:04 01/23/2015 23:44 01/24/2015 08:32 01/24/2015 12:36  Glucose-Capillary Latest Ref Range: 65-99 mg/dL 295309 (H) 284203 (H) 132131 (H) 162 (H) 186 (H)   Inpatient Diabetes Program Recommendations:  Insulin - Basal: Increase Lantus 15 units QD Correction (SSI): Novolog moderate Q4H HgbA1C: 11.7% - uncontrolled   Will order Living Well With Diabetes book and diabetes videos on pt ed channel.  Will follow. Thank you. Ailene Ardshonda Daisey Caloca, RD, LDN, CDE Inpatient Diabetes Coordinator [phone]  Addendum: Spoke with pt regarding his diabetes diagnosis. Has not been to a doctor in 30 years. States "I figured I had diabetes since everyone in my family has it." Discussed HgbA1C results and importance of keeping blood sugars in control. Discussed insulin at home and diet. Encouraged pt to view diabetes videos on pt ed channel. Will f/u in am.

## 2015-01-24 NOTE — Progress Notes (Signed)
MD paged regarding trend of Troponins. Patient reports no chest pain. Will continue to monitor.

## 2015-01-24 NOTE — Progress Notes (Signed)
ANTICOAGULATION CONSULT NOTE - Follow Up Consult  Pharmacy Consult for heparin Indication: rule out PE  Allergies  Allergen Reactions  . Codeine Other (See Comments)    intolerance    Patient Measurements: Height: 6\' 1"  (185.4 cm) Weight: 221 lb 1.9 oz (100.3 kg) IBW/kg (Calculated) : 79.9 Heparin Dosing Weight: 100 kg  Vital Signs: Temp: 97.8 F (36.6 C) (12/29 0409) Temp Source: Oral (12/29 0409) BP: 108/70 mmHg (12/29 0759) Pulse Rate: 96 (12/29 0759)  Labs:  Recent Labs  01/23/15 0842 01/23/15 1410 01/23/15 1545 01/23/15 1720 01/23/15 1917 01/23/15 2200 01/24/15 0027  HGB 15.4 13.3  --   --   --   --  13.1  HCT 45.1 37.9*  --   --   --   --  36.6*  PLT 254 234  --   --   --   --  240  HEPARINUNFRC  --   --  0.62  --   --  0.79* 0.56  CREATININE 1.38* 1.38*  --  1.35*  --   --  1.30*  TROPONINI  --  10.85*  --   --  28.64*  --  26.20*    Estimated Creatinine Clearance: 69.7 mL/min (by C-G formula based on Cr of 1.3).   Medications:  Heparin @ 1350 units/hr  Assessment:  64 y/o w/ PMH of DM on 01/23/2015 started on IV heparin for possible PE. Initially therapeutic on 1500 units/hr but became supra-therapeutic. Dose has been decreased to 1350 units/hr -HL 0.56, hgb 13.1, plts 240  Goal of Therapy:  Heparin level 0.3-0.7 units/ml Monitor platelets by anticoagulation protocol: Yes   Plan:  Continue heparin gtt at 1350 units/hr Daily HL, CBC Monitor for S&S of bleed  Sandi CarneNick Chrishawn Kring, PharmD Pharmacy Resident Pager: [phone] 01/24/2015,8:20 AM

## 2015-01-24 NOTE — Progress Notes (Signed)
DAILY PROGRESS NOTE  Subjective:  Didn't sleep well overnight. No chest pain. Troponin increased to 28.6 overnight and is trending down. BNP is elevated at 909. Creatinine has improved with diuresis to 1.3. A1C is elevated at 11.7. Diuresed almost 3L overnight.  Objective:  Temp:  [97.4 F (36.3 C)-98.6 F (37 C)] 98.6 F (37 C) (12/29 0800) Pulse Rate:  [92-125] 103 (12/29 0800) Resp:  [12-36] 36 (12/29 0800) BP: (83-158)/(59-116) 115/99 mmHg (12/29 0800) SpO2:  [93 %-100 %] 100 % (12/29 0800) FiO2 (%):  [40 %] 40 % (12/29 0800) Weight:  [221 lb 1.9 oz (100.3 kg)-234 lb 6.4 oz (106.323 kg)] 221 lb 1.9 oz (100.3 kg) (12/29 0409) Weight change:   Intake/Output from previous day: 12/28 0701 - 12/29 0700 In: 1845.7 [I.V.:1845.7] Out: 4775 [Urine:4775]  Intake/Output from this shift: Total I/O In: 13.5 [I.V.:13.5] Out: -   Medications: Current Facility-Administered Medications  Medication Dose Route Frequency Provider Last Rate Last Dose  . 0.9 %  sodium chloride infusion  250 mL Intravenous PRN Juanito Doom, MD      . acetaminophen (TYLENOL) tablet 650 mg  650 mg Oral Q4H PRN Juanito Doom, MD      . antiseptic oral rinse (CPC / CETYLPYRIDINIUM CHLORIDE 0.05%) solution 7 mL  7 mL Mouth Rinse BID Juanito Doom, MD   7 mL at 01/23/15 2238  . aspirin chewable tablet 324 mg  324 mg Oral Daily Juanito Doom, MD   Stopped at 01/23/15 1315   Or  . aspirin suppository 300 mg  300 mg Rectal Daily Juanito Doom, MD      . atorvastatin (LIPITOR) tablet 80 mg  80 mg Oral q1800 Erma Heritage, [place]   80 mg at 01/23/15 1710  . furosemide (LASIX) injection 40 mg  40 mg Intravenous Q6H Juanito Doom, MD      . heparin ADULT infusion 100 units/mL (25000 units/250 mL)  1,350 Units/hr Intravenous Continuous Otilio Miu, RPH 13.5 mL/hr at 01/24/15 0800 1,350 Units/hr at 01/24/15 0800  . hydrALAZINE (APRESOLINE) injection 10-40 mg  10-40 mg Intravenous Q4H PRN  Juanito Doom, MD      . insulin aspart (novoLOG) injection 0-15 Units  0-15 Units Subcutaneous 6 times per day Juanito Doom, MD   3 Units at 01/24/15 0447  . insulin glargine (LANTUS) injection 10 Units  10 Units Subcutaneous Daily Juanito Doom, MD      . iohexol (OMNIPAQUE) 350 MG/ML injection 80 mL  80 mL Intravenous Once PRN Leo Grosser, MD      . metoprolol tartrate (LOPRESSOR) tablet 25 mg  25 mg Oral BID Erma Heritage, [place]   25 mg at 01/23/15 2229  . ondansetron (ZOFRAN) injection 4 mg  4 mg Intravenous Q6H PRN Juanito Doom, MD      . traZODone (DESYREL) tablet 100 mg  100 mg Oral QHS PRN Juanito Doom, MD        Physical Exam: General appearance: alert and no distress Lungs: rales RLL Heart: regular rate and rhythm, S1, S2 normal, no murmur, click, rub or gallop Abdomen: soft, non-tender; bowel sounds normal; no masses,  no organomegaly Extremities: edema 1+ pitting edema Pulses: 2+ and symmetric Neurologic: Mental status: Alert, oriented, thought content appropriate  Lab Results: Results for orders placed or performed during the hospital encounter of 01/23/15 (from the past 48 hour(s))  CBC with Differential     Status: Abnormal  Collection Time: 01/23/15  8:42 AM  Result Value Ref Range   WBC 13.4 (H) 4.0 - 10.5 K/uL   RBC 5.47 4.22 - 5.81 MIL/uL   Hemoglobin 15.4 13.0 - 17.0 g/dL   HCT 45.1 39.0 - 52.0 %   MCV 82.4 78.0 - 100.0 fL   MCH 28.2 26.0 - 34.0 pg   MCHC 34.1 30.0 - 36.0 g/dL   RDW 13.4 11.5 - 15.5 %   Platelets 254 150 - 400 K/uL   Neutrophils Relative % 79 %   Neutro Abs 10.7 (H) 1.7 - 7.7 K/uL   Lymphocytes Relative 15 %   Lymphs Abs 2.0 0.7 - 4.0 K/uL   Monocytes Relative 4 %   Monocytes Absolute 0.5 0.1 - 1.0 K/uL   Eosinophils Relative 1 %   Eosinophils Absolute 0.2 0.0 - 0.7 K/uL   Basophils Relative 1 %   Basophils Absolute 0.1 0.0 - 0.1 K/uL  Brain natriuretic peptide     Status: Abnormal   Collection Time: 01/23/15   8:42 AM  Result Value Ref Range   B Natriuretic Peptide 395.7 (H) 0.0 - 100.0 pg/mL  Comprehensive metabolic panel     Status: Abnormal   Collection Time: 01/23/15  8:42 AM  Result Value Ref Range   Sodium 140 135 - 145 mmol/L   Potassium 3.5 3.5 - 5.1 mmol/L   Chloride 105 101 - 111 mmol/L   CO2 19 (L) 22 - 32 mmol/L   Glucose, Bld 428 (H) 65 - 99 mg/dL   BUN 12 6 - 20 mg/dL   Creatinine, Ser 1.38 (H) 0.61 - 1.24 mg/dL   Calcium 9.4 8.9 - 10.3 mg/dL   Total Protein 7.0 6.5 - 8.1 g/dL   Albumin 3.9 3.5 - 5.0 g/dL   AST 21 15 - 41 U/L   ALT 18 17 - 63 U/L   Alkaline Phosphatase 72 38 - 126 U/L   Total Bilirubin 0.8 0.3 - 1.2 mg/dL   GFR calc non Af Amer 52 (L) >60 mL/min   GFR calc Af Amer 60 (L) >60 mL/min    Comment: (NOTE) The eGFR has been calculated using the CKD EPI equation. This calculation has not been validated in all clinical situations. eGFR's persistently <60 mL/min signify possible Chronic Kidney Disease.    Anion gap 16 (H) 5 - 15  I-Stat arterial blood gas, ED     Status: Abnormal   Collection Time: 01/23/15  9:05 AM  Result Value Ref Range   pH, Arterial 7.348 (L) 7.350 - 7.450   pCO2 arterial 35.9 35.0 - 45.0 mmHg   pO2, Arterial 304.0 (H) 80.0 - 100.0 mmHg   Bicarbonate 19.7 (L) 20.0 - 24.0 mEq/L   TCO2 21 0 - 100 mmol/L   O2 Saturation 100.0 %   Acid-base deficit 5.0 (H) 0.0 - 2.0 mmol/L   Patient temperature 98.6 F    Collection site RADIAL, ALLEN'S TEST ACCEPTABLE    Drawn by RT    Sample type ARTERIAL   I-stat troponin, ED     Status: Abnormal   Collection Time: 01/23/15  9:17 AM  Result Value Ref Range   Troponin i, poc 0.25 (HH) 0.00 - 0.08 ng/mL   Comment NOTIFIED PHYSICIAN    Comment 3            Comment: Due to the release kinetics of cTnI, a negative result within the first hours of the onset of symptoms does not rule out myocardial infarction with  certainty. If myocardial infarction is still suspected, repeat the test at appropriate  intervals.   I-Stat CG4 Lactic Acid, ED     Status: Abnormal   Collection Time: 01/23/15  9:19 AM  Result Value Ref Range   Lactic Acid, Venous 5.34 (HH) 0.5 - 2.0 mmol/L   Comment NOTIFIED PHYSICIAN   Urinalysis, Routine w reflex microscopic (not at [facility])     Status: Abnormal   Collection Time: 01/23/15 10:14 AM  Result Value Ref Range   Color, Urine YELLOW YELLOW   APPearance CLEAR CLEAR   Specific Gravity, Urine 1.030 1.005 - 1.030   pH 5.0 5.0 - 8.0   Glucose, UA >1000 (A) NEGATIVE mg/dL   Hgb urine dipstick NEGATIVE NEGATIVE   Bilirubin Urine NEGATIVE NEGATIVE   Ketones, ur 15 (A) NEGATIVE mg/dL   Protein, ur NEGATIVE NEGATIVE mg/dL   Nitrite NEGATIVE NEGATIVE   Leukocytes, UA NEGATIVE NEGATIVE  Urine culture     Status: None   Collection Time: 01/23/15 10:14 AM  Result Value Ref Range   Specimen Description URINE, CATHETERIZED    Special Requests NONE    Culture NO GROWTH 1 DAY    Report Status 01/24/2015 FINAL   Urine microscopic-add on     Status: Abnormal   Collection Time: 01/23/15 10:14 AM  Result Value Ref Range   Squamous Epithelial / LPF 0-5 (A) NONE SEEN   WBC, UA 0-5 0 - 5 WBC/hpf   RBC / HPF 0-5 0 - 5 RBC/hpf   Bacteria, UA RARE (A) NONE SEEN   Casts HYALINE CASTS (A) NEGATIVE   Urine-Other AMORPHOUS URATES/PHOSPHATES   CBG monitoring, ED     Status: Abnormal   Collection Time: 01/23/15 11:06 AM  Result Value Ref Range   Glucose-Capillary 393 (H) 65 - 99 mg/dL  CBG monitoring, ED     Status: Abnormal   Collection Time: 01/23/15 12:16 PM  Result Value Ref Range   Glucose-Capillary 347 (H) 65 - 99 mg/dL  MRSA PCR Screening     Status: None   Collection Time: 01/23/15 12:55 PM  Result Value Ref Range   MRSA by PCR NEGATIVE NEGATIVE    Comment:        The GeneXpert MRSA Assay (FDA approved for NASAL specimens only), is one component of a comprehensive MRSA colonization surveillance program. It is not intended to diagnose MRSA infection nor to guide  or monitor treatment for MRSA infections.   CBC     Status: Abnormal   Collection Time: 01/23/15  2:10 PM  Result Value Ref Range   WBC 12.5 (H) 4.0 - 10.5 K/uL   RBC 4.70 4.22 - 5.81 MIL/uL   Hemoglobin 13.3 13.0 - 17.0 g/dL   HCT 37.9 (L) 39.0 - 52.0 %   MCV 80.6 78.0 - 100.0 fL   MCH 28.3 26.0 - 34.0 pg   MCHC 35.1 30.0 - 36.0 g/dL   RDW 13.2 11.5 - 15.5 %   Platelets 234 150 - 400 K/uL  Creatinine, serum     Status: Abnormal   Collection Time: 01/23/15  2:10 PM  Result Value Ref Range   Creatinine, Ser 1.38 (H) 0.61 - 1.24 mg/dL   GFR calc non Af Amer 52 (L) >60 mL/min   GFR calc Af Amer 60 (L) >60 mL/min    Comment: (NOTE) The eGFR has been calculated using the CKD EPI equation. This calculation has not been validated in all clinical situations. eGFR's persistently <60 mL/min signify possible Chronic  Kidney Disease.   Lactic acid, plasma     Status: Abnormal   Collection Time: 01/23/15  2:10 PM  Result Value Ref Range   Lactic Acid, Venous 3.1 (HH) 0.5 - 2.0 mmol/L    Comment: CRITICAL RESULT CALLED TO, READ BACK BY AND VERIFIED WITH: ALEXANDER,K RN @ 1503 01/23/15 LEONARD,A   Troponin I     Status: Abnormal   Collection Time: 01/23/15  2:10 PM  Result Value Ref Range   Troponin I 10.85 (HH) <0.031 ng/mL    Comment:        POSSIBLE MYOCARDIAL ISCHEMIA. SERIAL TESTING RECOMMENDED. CRITICAL RESULT CALLED TO, READ BACK BY AND VERIFIED WITH: ALEXANDER,K RN @ 5732 01/23/15 LEONARD,A   Hemoglobin A1c     Status: Abnormal   Collection Time: 01/23/15  2:10 PM  Result Value Ref Range   Hgb A1c MFr Bld 11.7 (H) 4.8 - 5.6 %    Comment: (NOTE)         Pre-diabetes: 5.7 - 6.4         Diabetes: >6.4         Glycemic control for adults with diabetes: <7.0    Mean Plasma Glucose 289 mg/dL    Comment: (NOTE) Performed At: [facility] [address] [place], [place] [id number] Lindon Romp MD CB:[id number]   TSH     Status: None   Collection Time:  01/23/15  2:10 PM  Result Value Ref Range   TSH 1.311 0.350 - 4.500 uIU/mL  Heparin level (unfractionated)     Status: None   Collection Time: 01/23/15  3:45 PM  Result Value Ref Range   Heparin Unfractionated 0.62 0.30 - 0.70 IU/mL    Comment:        IF HEPARIN RESULTS ARE BELOW EXPECTED VALUES, AND PATIENT DOSAGE HAS BEEN CONFIRMED, SUGGEST FOLLOW UP TESTING OF ANTITHROMBIN III LEVELS.   Glucose, capillary     Status: Abnormal   Collection Time: 01/23/15  5:17 PM  Result Value Ref Range   Glucose-Capillary 309 (H) 65 - 99 mg/dL  Basic metabolic panel     Status: Abnormal   Collection Time: 01/23/15  5:20 PM  Result Value Ref Range   Sodium 140 135 - 145 mmol/L   Potassium 4.1 3.5 - 5.1 mmol/L   Chloride 106 101 - 111 mmol/L   CO2 20 (L) 22 - 32 mmol/L   Glucose, Bld 349 (H) 65 - 99 mg/dL   BUN 14 6 - 20 mg/dL   Creatinine, Ser 1.35 (H) 0.61 - 1.24 mg/dL   Calcium 8.9 8.9 - 10.3 mg/dL   GFR calc non Af Amer 53 (L) >60 mL/min   GFR calc Af Amer >60 >60 mL/min    Comment: (NOTE) The eGFR has been calculated using the CKD EPI equation. This calculation has not been validated in all clinical situations. eGFR's persistently <60 mL/min signify possible Chronic Kidney Disease.    Anion gap 14 5 - 15  Troponin I     Status: Abnormal   Collection Time: 01/23/15  7:17 PM  Result Value Ref Range   Troponin I 28.64 (HH) <0.031 ng/mL    Comment:        POSSIBLE MYOCARDIAL ISCHEMIA. SERIAL TESTING RECOMMENDED. CRITICAL VALUE NOTED.  VALUE IS CONSISTENT WITH PREVIOUSLY REPORTED AND CALLED VALUE.   Glucose, capillary     Status: Abnormal   Collection Time: 01/23/15  8:04 PM  Result Value Ref Range   Glucose-Capillary 203 (H) 65 - 99  mg/dL   Comment 1 Capillary Specimen   Heparin level (unfractionated)     Status: Abnormal   Collection Time: 01/23/15 10:00 PM  Result Value Ref Range   Heparin Unfractionated 0.79 (H) 0.30 - 0.70 IU/mL    Comment:        IF HEPARIN RESULTS ARE  BELOW EXPECTED VALUES, AND PATIENT DOSAGE HAS BEEN CONFIRMED, SUGGEST FOLLOW UP TESTING OF ANTITHROMBIN III LEVELS.   Glucose, capillary     Status: Abnormal   Collection Time: 01/23/15 11:44 PM  Result Value Ref Range   Glucose-Capillary 131 (H) 65 - 99 mg/dL   Comment 1 Capillary Specimen   Troponin I     Status: Abnormal   Collection Time: 01/24/15 12:27 AM  Result Value Ref Range   Troponin I 26.20 (HH) <0.031 ng/mL    Comment:        POSSIBLE MYOCARDIAL ISCHEMIA. SERIAL TESTING RECOMMENDED. CRITICAL VALUE NOTED.  VALUE IS CONSISTENT WITH PREVIOUSLY REPORTED AND CALLED VALUE.   Heparin level (unfractionated)     Status: None   Collection Time: 01/24/15 12:27 AM  Result Value Ref Range   Heparin Unfractionated 0.56 0.30 - 0.70 IU/mL    Comment:        IF HEPARIN RESULTS ARE BELOW EXPECTED VALUES, AND PATIENT DOSAGE HAS BEEN CONFIRMED, SUGGEST FOLLOW UP TESTING OF ANTITHROMBIN III LEVELS.   CBC     Status: Abnormal   Collection Time: 01/24/15 12:27 AM  Result Value Ref Range   WBC 13.9 (H) 4.0 - 10.5 K/uL   RBC 4.51 4.22 - 5.81 MIL/uL   Hemoglobin 13.1 13.0 - 17.0 g/dL   HCT 36.6 (L) 39.0 - 52.0 %   MCV 81.2 78.0 - 100.0 fL   MCH 29.0 26.0 - 34.0 pg   MCHC 35.8 30.0 - 36.0 g/dL   RDW 13.4 11.5 - 15.5 %   Platelets 240 150 - 400 K/uL  Brain natriuretic peptide     Status: Abnormal   Collection Time: 01/24/15 12:27 AM  Result Value Ref Range   B Natriuretic Peptide 909.6 (H) 0.0 - 100.0 pg/mL  Basic metabolic panel     Status: Abnormal   Collection Time: 01/24/15 12:27 AM  Result Value Ref Range   Sodium 141 135 - 145 mmol/L   Potassium 3.3 (L) 3.5 - 5.1 mmol/L    Comment: DELTA CHECK NOTED   Chloride 107 101 - 111 mmol/L   CO2 24 22 - 32 mmol/L   Glucose, Bld 159 (H) 65 - 99 mg/dL   BUN 15 6 - 20 mg/dL   Creatinine, Ser 1.30 (H) 0.61 - 1.24 mg/dL   Calcium 8.5 (L) 8.9 - 10.3 mg/dL   GFR calc non Af Amer 56 (L) >60 mL/min   GFR calc Af Amer >60 >60 mL/min      Comment: (NOTE) The eGFR has been calculated using the CKD EPI equation. This calculation has not been validated in all clinical situations. eGFR's persistently <60 mL/min signify possible Chronic Kidney Disease.    Anion gap 10 5 - 15  Magnesium     Status: None   Collection Time: 01/24/15 12:27 AM  Result Value Ref Range   Magnesium 1.7 1.7 - 2.4 mg/dL  Phosphorus     Status: None   Collection Time: 01/24/15 12:27 AM  Result Value Ref Range   Phosphorus 3.9 2.5 - 4.6 mg/dL  Lipid panel     Status: Abnormal   Collection Time: 01/24/15 12:27 AM  Result  Value Ref Range   Cholesterol 157 0 - 200 mg/dL   Triglycerides 66 <150 mg/dL   HDL 34 (L) >40 mg/dL   Total CHOL/HDL Ratio 4.6 RATIO   VLDL 13 0 - 40 mg/dL   LDL Cholesterol 110 (H) 0 - 99 mg/dL    Comment:        Total Cholesterol/HDL:CHD Risk Coronary Heart Disease Risk Table                     Men   Women  1/2 Average Risk   3.4   3.3  Average Risk       5.0   4.4  2 X Average Risk   9.6   7.1  3 X Average Risk  23.4   11.0        Use the calculated Patient Ratio above and the CHD Risk Table to determine the patient's CHD Risk.        ATP III CLASSIFICATION (LDL):  <100     mg/dL   Optimal  100-129  mg/dL   Near or Above                    Optimal  130-159  mg/dL   Borderline  160-189  mg/dL   High  >190     mg/dL   Very High   Glucose, capillary     Status: Abnormal   Collection Time: 01/24/15  8:32 AM  Result Value Ref Range   Glucose-Capillary 162 (H) 65 - 99 mg/dL   Comment 1 Capillary Specimen     Imaging: Dg Chest Port 1 View  01/23/2015  CLINICAL DATA:  Congestive heart failure EXAM: PORTABLE CHEST 1 VIEW COMPARISON:  None. FINDINGS: There is generalized interstitial edema with patchy alveolar edema in the bases. Heart is enlarged with pulmonary venous hypertension. No adenopathy. IMPRESSION: Evidence of congestive heart failure with edema most pronounced in the lung bases. Electronically Signed    By: Lowella Grip III M.D.   On: 01/23/2015 09:34    Assessment:  1. Principal Problem: 2.   Acute systolic congestive heart failure, NYHA class 4 ([place]) 3. Active Problems: 4.   Acute respiratory failure with hypoxemia (HCC) 5.   Hypertensive heart disease with congestive heart failure ([place]) 6.   Poorly controlled type 2 diabetes mellitus with peripheral neuropathy ([place]) 7.   Diabetic nephropathy associated with diabetes mellitus due to underlying condition ([place]) 8.   NSTEMI (non-ST elevated myocardial infarction) ([place]) 9.   Mural thrombus of cardiac apex (HCC) 10.   Dyslipidemia 11.   Plan:  1. Troponin rose additionally overnight, but is trending down. Despite symptoms for 2 weeks prior to admission, it appears he had acute ischemia just prior to admission. He currently denies chest pain. Will replace nitro patch with a nitro gtts. Continue IV heparin. Diurese further today. Plan for [facility] tomorrow with possible PCI, however, concern for multivessel CAD.  Time Spent Directly with Patient:  30 minutes  Length of Stay:  LOS: 1 day   Pixie Casino, MD, [facility] Attending Cardiologist [place] 01/24/2015, 9:06 AM

## 2015-01-24 NOTE — Progress Notes (Signed)
PULMONARY / CRITICAL CARE MEDICINE   Name: Bradley Benitez MRN: [id number] DOB: 02/26/1950    ADMISSION DATE:  01/23/2015 CONSULTATION DATE:  01/23/2015  REFERRING MD:  Bradley Benitez  CHIEF COMPLAINT:  Dyspnea  Brief description:  Bradley Benitez was admitted on 12/28 with a CHF exacerbation after not going to the doctor for years.  He smoked 3 ppd for 20 years, quit 20 years ago, has 2 brothers who died of CHF.   SUBJECTIVE:  Didn't sleep Troponin went up yesterday  VITAL SIGNS: BP 115/99 mmHg  Pulse 103  Temp(Src) 98.6 F (37 C) (Oral)  Resp 36  Ht 6\' 1"  (1.854 m)  Wt 100.3 kg (221 lb 1.9 oz)  BMI 29.18 kg/m2  SpO2 100%  HEMODYNAMICS:    VENTILATOR SETTINGS: Vent Mode:  [-] BIPAP;PCV FiO2 (%):  [40 %] 40 % Set Rate:  [10 bmp] 10 bmp PEEP:  [5 cmH20] 5 cmH20  INTAKE / OUTPUT: I/O last 3 completed shifts: In: 1845.7 [I.V.:1845.7] Out: 4775 [Urine:4775]  PHYSICAL EXAMINATION: General:  Chronically ill-appearing, no distress Neuro:  Awake alert, oriented 4, moves all 4 extremities HEENT:  Normocephalic atraumatic, oropharynx clear, extraocular movements intact Cardiovascular:  Tachycardic, regular rate and rhythm, S3 noted, JVD noted Lungs:  Improved respiratory effort, some rales Abdomen:  Bowel sounds positive, soft, nontender Musculoskeletal:  Normal bulk and tone Skin:  Pitting edema to the knees, normal cap refill today  LABS:  BMET  Recent Labs Lab 01/23/15 0842 01/23/15 1410 01/23/15 1720 01/24/15 0027  NA 140  --  140 141  K 3.5  --  4.1 3.3*  CL 105  --  106 107  CO2 19*  --  20* 24  BUN 12  --  14 15  CREATININE 1.38* 1.38* 1.35* 1.30*  GLUCOSE 428*  --  349* 159*    Electrolytes  Recent Labs Lab 01/23/15 0842 01/23/15 1720 01/24/15 0027  CALCIUM 9.4 8.9 8.5*  MG  --   --  1.7  PHOS  --   --  3.9    CBC  Recent Labs Lab 01/23/15 0842 01/23/15 1410 01/24/15 0027  WBC 13.4* 12.5* 13.9*  HGB 15.4 13.3 13.1  HCT 45.1 37.9* 36.6*   PLT 254 234 240    Coag's No results for input(s): APTT, INR in the last 168 hours.  Sepsis Markers  Recent Labs Lab 01/23/15 0919 01/23/15 1410  LATICACIDVEN 5.34* 3.1*    ABG  Recent Labs Lab 01/23/15 0905  PHART 7.348*  PCO2ART 35.9  PO2ART 304.0*    Liver Enzymes  Recent Labs Lab 01/23/15 0842  AST 21  ALT 18  ALKPHOS 72  BILITOT 0.8  ALBUMIN 3.9    Cardiac Enzymes  Recent Labs Lab 01/23/15 1410 01/23/15 1917 01/24/15 0027  TROPONINI 10.85* 28.64* 26.20*    Glucose  Recent Labs Lab 01/23/15 1106 01/23/15 1216 01/23/15 1717 01/23/15 2004 01/23/15 2344 01/24/15 0832  GLUCAP 393* 347* 309* 203* 131* 162*    Imaging Dg Chest Port 1 View  01/23/2015  CLINICAL DATA:  Congestive heart failure EXAM: PORTABLE CHEST 1 VIEW COMPARISON:  None. FINDINGS: There is generalized interstitial edema with patchy alveolar edema in the bases. Heart is enlarged with pulmonary venous hypertension. No adenopathy. IMPRESSION: Evidence of congestive heart failure with edema most pronounced in the lung bases. Electronically Signed   By: Bretta BangWilliam  Woodruff III M.D.   On: 01/23/2015 09:34     STUDIES:  01/23/2015 echocardiogram > LV 20-25%, anterior wall motion  abnormality with clot  CULTURES: 01/23/2015 blood culture > 01/23/2015 urine culture  >  ANTIBIOTICS: 01/23/2015 vancomycin 1 01/23/2015 Zosyn 1  SIGNIFICANT EVENTS: 01/23/2015 admission to [facility], BiPAP, cardiology consult  LINES/TUBES: 01/23/2015 BiPAP   DISCUSSION: 64 y/o male admitted 12/28 with acute systolic CHF in the setting of likely severe coronary artery disease.  Improved respiratory effort after diuresis last night, suspect he has at least LAD disease based on EKG.  Troponin elevation shows signs of myocardial infarct.  ASSESSMENT / PLAN:  PULMONARY A: Acute hypoxemic respiratory failure secondary to acute pulmonary edema in the setting of very likely congestive  heart failure > improving oxygenation with diuresis P:   Change high flow to regular nasal cannula Monitor O2 saturations, keep above 90% Diuresis again today  CARDIOVASCULAR A:  Acute systolic congestive heart failure Coronary artery disease Age indeterminate myocardial infarct > troponin climbing overnight Hypertension, improved, diastolic still elevated Sinus tachycardia > improved with metoprolol P:  Aspirin Continue heparin gtt for now LHC per cardiology Continue Metoprolol Consider adding hydralazine as can't do ACE right now with Cr 1.3 Telemetry monitoring  RENAL A:   Presumed chronic kidney disease, uncertain baseline P:   Monitor BMET and UOP Replace electrolytes as needed  GASTROINTESTINAL A:   No acute issues P:   NPO for potential heart cath  HEMATOLOGIC A:   No acute issues  P:  Monitor for bleeding  INFECTIOUS A:   No evidence of infection P:   Monitor for fever  ENDOCRINE A:   Hyperglycemia, poorly controlled   P:   F/U hemoglobin A1c SSI Add glargine 10 units today, will likely need more   NEUROLOGIC A:   Neuropathic pain secondary to poorly controlled diabetes P:   Consider wound care consult for foot ulcerations    FAMILY  - Updates: Family (niece, sister in law) updated at bedside today 12/29   This patient remains critically ill due to acute pulmonary edema and what appears to be active ischemia overnight and requires continued ICU monitoring.  My cc time today is 37 minutes  Cardiology to assume care, PCCM available as needed   Heber Ferrelview, MD [place] P[facility]M Pager: [phone] Cell: [phone] After 3pm or if no response, call [phone]   01/24/2015, 8:42 AM

## 2015-01-24 NOTE — Progress Notes (Signed)
eLink Physician-Brief Progress Note Patient Name: Bradley PilaDavid M Benitez DOB: Sep 10, 1950 MRN: [id number]   Date of Service  01/24/2015  HPI/Events of Note  Hypokalemia  eICU Interventions  Potassium replaced     Intervention Category Intermediate Interventions: Electrolyte abnormality - evaluation and management  Bradley Benitez 01/24/2015, 2:47 AM

## 2015-01-25 ENCOUNTER — Other Ambulatory Visit: Payer: Self-pay | Admitting: *Deleted

## 2015-01-25 ENCOUNTER — Encounter (HOSPITAL_COMMUNITY): Payer: Self-pay | Admitting: Cardiovascular Disease

## 2015-01-25 ENCOUNTER — Encounter (HOSPITAL_COMMUNITY): Payer: Self-pay

## 2015-01-25 ENCOUNTER — Encounter (HOSPITAL_COMMUNITY)
Admission: EM | Disposition: A | Discharge status: Skilled Nursing Facility | Payer: PPO | Source: Home / Self Care | Attending: Cardiothoracic Surgery

## 2015-01-25 ENCOUNTER — Inpatient Hospital Stay (HOSPITAL_COMMUNITY): Payer: PPO

## 2015-01-25 DIAGNOSIS — I2510 Atherosclerotic heart disease of native coronary artery without angina pectoris: Secondary | ICD-10-CM

## 2015-01-25 DIAGNOSIS — I5021 Acute systolic (congestive) heart failure: Secondary | ICD-10-CM

## 2015-01-25 HISTORY — PX: CARDIAC CATHETERIZATION: SHX172

## 2015-01-25 LAB — POCT I-STAT 3, ART BLOOD GAS (G3+)
Bicarbonate: 24 mEq/L (ref 20.0–24.0)
O2 SAT: 95 %
PO2 ART: 77 mmHg — AB (ref 80.0–100.0)
TCO2: 25 mmol/L (ref 0–100)
pCO2 arterial: 37.4 mmHg (ref 35.0–45.0)
pH, Arterial: 7.415 (ref 7.350–7.450)

## 2015-01-25 LAB — CBC WITH DIFFERENTIAL/PLATELET
BASOS ABS: 0 10*3/uL (ref 0.0–0.1)
BASOS PCT: 1 %
EOS PCT: 2 %
Eosinophils Absolute: 0.2 10*3/uL (ref 0.0–0.7)
HEMATOCRIT: 37.2 % — AB (ref 39.0–52.0)
Hemoglobin: 12.6 g/dL — ABNORMAL LOW (ref 13.0–17.0)
Lymphocytes Relative: 18 %
Lymphs Abs: 1.5 10*3/uL (ref 0.7–4.0)
MCH: 28.2 pg (ref 26.0–34.0)
MCHC: 33.9 g/dL (ref 30.0–36.0)
MCV: 83.2 fL (ref 78.0–100.0)
MONO ABS: 0.8 10*3/uL (ref 0.1–1.0)
Monocytes Relative: 10 %
NEUTROS ABS: 6 10*3/uL (ref 1.7–7.7)
Neutrophils Relative %: 69 %
PLATELETS: 204 10*3/uL (ref 150–400)
RBC: 4.47 MIL/uL (ref 4.22–5.81)
RDW: 13.6 % (ref 11.5–15.5)
WBC: 8.5 10*3/uL (ref 4.0–10.5)

## 2015-01-25 LAB — GLUCOSE, CAPILLARY
GLUCOSE-CAPILLARY: 134 mg/dL — AB (ref 65–99)
GLUCOSE-CAPILLARY: 136 mg/dL — AB (ref 65–99)
Glucose-Capillary: 111 mg/dL — ABNORMAL HIGH (ref 65–99)
Glucose-Capillary: 136 mg/dL — ABNORMAL HIGH (ref 65–99)
Glucose-Capillary: 200 mg/dL — ABNORMAL HIGH (ref 65–99)
Glucose-Capillary: 211 mg/dL — ABNORMAL HIGH (ref 65–99)

## 2015-01-25 LAB — POCT I-STAT 3, VENOUS BLOOD GAS (G3P V)
ACID-BASE EXCESS: 1 mmol/L (ref 0.0–2.0)
BICARBONATE: 25.6 meq/L — AB (ref 20.0–24.0)
O2 SAT: 65 %
TCO2: 27 mmol/L (ref 0–100)
pCO2, Ven: 40.8 mmHg — ABNORMAL LOW (ref 45.0–50.0)
pH, Ven: 7.405 — ABNORMAL HIGH (ref 7.250–7.300)
pO2, Ven: 34 mmHg (ref 30.0–45.0)

## 2015-01-25 LAB — BASIC METABOLIC PANEL
ANION GAP: 12 (ref 5–15)
BUN: 15 mg/dL (ref 6–20)
CHLORIDE: 107 mmol/L (ref 101–111)
CO2: 24 mmol/L (ref 22–32)
Calcium: 8.5 mg/dL — ABNORMAL LOW (ref 8.9–10.3)
Creatinine, Ser: 1.21 mg/dL (ref 0.61–1.24)
GFR calc Af Amer: >60 mL/min (ref >60)
GFR calc non Af Amer: >60 mL/min (ref >60)
GLUCOSE: 135 mg/dL — AB (ref 65–99)
POTASSIUM: 3.7 mmol/L (ref 3.5–5.1)
Sodium: 143 mmol/L (ref 135–145)

## 2015-01-25 LAB — HEPARIN LEVEL (UNFRACTIONATED)
Heparin Unfractionated: 0.2 IU/mL — ABNORMAL LOW (ref 0.30–0.70)
Heparin Unfractionated: 0.23 IU/mL — ABNORMAL LOW (ref 0.30–0.70)

## 2015-01-25 LAB — SURGICAL PCR SCREEN
MRSA, PCR: NEGATIVE
Staphylococcus aureus: NEGATIVE

## 2015-01-25 LAB — PROTIME-INR
INR: 1.1 (ref 0.00–1.49)
PROTHROMBIN TIME: 14.4 s (ref 11.6–15.2)

## 2015-01-25 SURGERY — RIGHT/LEFT HEART CATH AND CORONARY ANGIOGRAPHY

## 2015-01-25 MED ORDER — LIDOCAINE HCL (PF) 1 % IJ SOLN
INTRAMUSCULAR | Status: AC
  Filled 2015-01-25: qty 30

## 2015-01-25 MED ORDER — HEPARIN SODIUM (PORCINE) 1000 UNIT/ML IJ SOLN
INTRAMUSCULAR | Status: AC
  Filled 2015-01-25: qty 1

## 2015-01-25 MED ORDER — SODIUM CHLORIDE 0.9 % IJ SOLN: 3.0000 mL | INTRAMUSCULAR | Status: DC | PRN

## 2015-01-25 MED ORDER — SODIUM CHLORIDE 0.9 % IJ SOLN
3.0000 mL | Freq: Two times a day (BID) | INTRAMUSCULAR | Status: DC
  Administered 2015-01-25 – 2015-01-31 (×12): 3 mL via INTRAVENOUS

## 2015-01-25 MED ORDER — SODIUM CHLORIDE 0.9 % IJ SOLN
3.0000 mL | Freq: Two times a day (BID) | INTRAMUSCULAR | Status: DC
  Administered 2015-01-25: 3 mL via INTRAVENOUS

## 2015-01-25 MED ORDER — OXYMETAZOLINE HCL 0.05 % NA SOLN
1.0000 | Freq: Two times a day (BID) | NASAL | Status: DC
  Filled 2015-01-25: qty 15

## 2015-01-25 MED ORDER — MIDAZOLAM HCL 2 MG/2ML IJ SOLN
INTRAMUSCULAR | Status: DC | PRN
  Administered 2015-01-25 (×2): 1 mg via INTRAVENOUS

## 2015-01-25 MED ORDER — SODIUM CHLORIDE 0.9 % IV SOLN: 250.0000 mL | INTRAVENOUS | Status: DC | PRN

## 2015-01-25 MED ORDER — ASPIRIN 300 MG RE SUPP: 300.0000 mg | Freq: Every day | RECTAL | Status: DC

## 2015-01-25 MED ORDER — SODIUM CHLORIDE 0.9 % IV SOLN: INTRAVENOUS | Status: DC

## 2015-01-25 MED ORDER — HEPARIN SODIUM (PORCINE) 1000 UNIT/ML IJ SOLN
INTRAMUSCULAR | Status: DC | PRN
  Administered 2015-01-25: 5000 [IU] via INTRAVENOUS

## 2015-01-25 MED ORDER — ASPIRIN 81 MG PO CHEW
324.0000 mg | CHEWABLE_TABLET | Freq: Every day | ORAL | Status: DC
  Administered 2015-01-26 – 2015-01-29 (×4): 324 mg via ORAL
  Filled 2015-01-25 (×4): qty 4

## 2015-01-25 MED ORDER — MIDAZOLAM HCL 2 MG/2ML IJ SOLN
INTRAMUSCULAR | Status: AC
  Filled 2015-01-25: qty 2

## 2015-01-25 MED ORDER — FENTANYL CITRATE (PF) 100 MCG/2ML IJ SOLN
INTRAMUSCULAR | Status: DC | PRN
  Administered 2015-01-25 (×2): 25 ug via INTRAVENOUS

## 2015-01-25 MED ORDER — HEPARIN (PORCINE) IN NACL 100-0.45 UNIT/ML-% IJ SOLN
1550.0000 [IU]/h | INTRAMUSCULAR | Status: DC
  Administered 2015-01-25: 1550 [IU]/h via INTRAVENOUS
  Filled 2015-01-25: qty 250

## 2015-01-25 MED ORDER — BUDESONIDE-FORMOTEROL FUMARATE 160-4.5 MCG/ACT IN AERO
2.0000 | INHALATION_SPRAY | Freq: Two times a day (BID) | RESPIRATORY_TRACT | Status: DC
  Administered 2015-01-25: 2 via RESPIRATORY_TRACT
  Filled 2015-01-25: qty 6

## 2015-01-25 MED ORDER — VERAPAMIL HCL 2.5 MG/ML IV SOLN
INTRAVENOUS | Status: AC
  Filled 2015-01-25: qty 2

## 2015-01-25 MED ORDER — SODIUM CHLORIDE 0.9 % IJ SOLN
3.0000 mL | Freq: Two times a day (BID) | INTRAMUSCULAR | Status: DC
Start: 2015-01-25 — End: 2015-02-01
  Administered 2015-01-25 – 2015-01-31 (×6): 3 mL via INTRAVENOUS

## 2015-01-25 MED ORDER — HEPARIN (PORCINE) IN NACL 2-0.9 UNIT/ML-% IJ SOLN
INTRAMUSCULAR | Status: AC
  Filled 2015-01-25: qty 1000

## 2015-01-25 MED ORDER — FENTANYL CITRATE (PF) 100 MCG/2ML IJ SOLN
INTRAMUSCULAR | Status: AC
  Filled 2015-01-25: qty 2

## 2015-01-25 MED ORDER — SODIUM CHLORIDE 0.9 % IV SOLN
INTRAVENOUS | Status: DC
  Administered 2015-01-25: 09:00:00 via INTRAVENOUS

## 2015-01-25 MED ORDER — FUROSEMIDE 10 MG/ML IJ SOLN
40.0000 mg | Freq: Two times a day (BID) | INTRAMUSCULAR | Status: AC
  Administered 2015-01-26 (×2): 40 mg via INTRAVENOUS
  Filled 2015-01-25 (×2): qty 4

## 2015-01-25 MED ORDER — INSULIN GLARGINE 100 UNIT/ML ~~LOC~~ SOLN
15.0000 [IU] | Freq: Every day | SUBCUTANEOUS | Status: DC
  Administered 2015-01-25 – 2015-01-31 (×7): 15 [IU] via SUBCUTANEOUS
  Filled 2015-01-25 (×8): qty 0.15

## 2015-01-25 MED ORDER — VERAPAMIL HCL 2.5 MG/ML IV SOLN
INTRAVENOUS | Status: DC | PRN
  Administered 2015-01-25: 10:00:00 via INTRA_ARTERIAL

## 2015-01-25 MED ORDER — ASPIRIN 81 MG PO CHEW: 324.0000 mg | CHEWABLE_TABLET | Freq: Once | ORAL | Status: DC

## 2015-01-25 MED ORDER — HEPARIN (PORCINE) IN NACL 100-0.45 UNIT/ML-% IJ SOLN
1750.0000 [IU]/h | INTRAMUSCULAR | Status: DC
  Administered 2015-01-25: 1550 [IU]/h via INTRAVENOUS
  Administered 2015-01-26: 1750 [IU]/h via INTRAVENOUS
  Filled 2015-01-25: qty 250

## 2015-01-25 SURGICAL SUPPLY — 14 items
CATH BALLN WEDGE 5F 110CM (CATHETERS) ×2 IMPLANT
CATH INFINITI 5 FR JL3.5 (CATHETERS) ×3 IMPLANT
CATH INFINITI 5FR ANG PIGTAIL (CATHETERS) ×3 IMPLANT
CATH INFINITI JR4 5F (CATHETERS) ×3 IMPLANT
DEVICE RAD COMP TR BAND LRG (VASCULAR PRODUCTS) ×3 IMPLANT
GLIDESHEATH SLEND SS 6F .021 (SHEATH) ×3 IMPLANT
GUIDEWIRE .025 260CM (WIRE) ×2 IMPLANT
KIT HEART LEFT (KITS) ×3 IMPLANT
KIT HEART RIGHT NAMIC (KITS) ×3 IMPLANT
PACK CARDIAC CATHETERIZATION (CUSTOM PROCEDURE TRAY) ×3 IMPLANT
SHEATH FAST CATH BRACH 5F 5CM (SHEATH) ×2 IMPLANT
TRANSDUCER W/STOPCOCK (MISCELLANEOUS) ×4 IMPLANT
TUBING CIL FLEX 10 FLL-RA (TUBING) ×3 IMPLANT
WIRE SAFE-T 1.5MM-J .035X260CM (WIRE) ×3 IMPLANT

## 2015-01-25 NOTE — H&P (View-Only) (Signed)
DAILY PROGRESS NOTE  Subjective:  No events overnight. BG's remain mildly elevated. Diuresed an additional 1.6L negative - now out a total of 4.6L. Plan for [facility] today. Creatinine further improved to 1.21.  Objective:  Temp:  [97.5 F (36.4 C)-98.7 F (37.1 C)] 97.7 F (36.5 C) (12/30 0400) Pulse Rate:  [83-110] 95 (12/30 0753) Resp:  [16-28] 18 (12/30 0753) BP: (102-150)/(54-119) 150/90 mmHg (12/30 0700) SpO2:  [91 %-100 %] 99 % (12/30 0753) FiO2 (%):  [30 %-55 %] 55 % (12/30 0753) Weight:  [226 lb 3.2 oz (102.604 kg)] 226 lb 3.2 oz (102.604 kg) (12/30 0600) Weight change: -8 lb 3.2 oz (-3.719 kg)  Intake/Output from previous day: 12/29 0701 - 12/30 0700 In: 716.5 [P.O.:360; I.V.:356.5] Out: 2400 [Urine:2400]  Intake/Output from this shift:    Medications: Current Facility-Administered Medications  Medication Dose Route Frequency Provider Last Rate Last Dose  . 0.9 %  sodium chloride infusion  250 mL Intravenous PRN Juanito Doom, MD      . 0.9 %  sodium chloride infusion  250 mL Intravenous PRN Pixie Casino, MD      . 0.9 %  sodium chloride infusion   Intravenous Continuous Leonie Man, MD      . acetaminophen (TYLENOL) tablet 650 mg  650 mg Oral Q4H PRN Juanito Doom, MD      . antiseptic oral rinse (CPC / CETYLPYRIDINIUM CHLORIDE 0.05%) solution 7 mL  7 mL Mouth Rinse BID Juanito Doom, MD   7 mL at 01/24/15 1000  . aspirin chewable tablet 324 mg  324 mg Oral Once Leonie Man, MD      . Derrill Memo ON 01/26/2015] aspirin chewable tablet 324 mg  324 mg Oral Daily Juanito Doom, MD       Or  . Derrill Memo ON 01/26/2015] aspirin suppository 300 mg  300 mg Rectal Daily Juanito Doom, MD      . atorvastatin (LIPITOR) tablet 80 mg  80 mg Oral q1800 Erma Heritage, PA   80 mg at 01/24/15 1752  . heparin ADULT infusion 100 units/mL (25000 units/250 mL)  1,550 Units/hr Intravenous Continuous Franky Macho, RPH 15.5 mL/hr at 01/25/15 9794 1,550  Units/hr at 01/25/15 8016  . hydrALAZINE (APRESOLINE) injection 10-40 mg  10-40 mg Intravenous Q4H PRN Juanito Doom, MD      . insulin aspart (novoLOG) injection 0-15 Units  0-15 Units Subcutaneous 6 times per day Juanito Doom, MD   2 Units at 01/25/15 [phone]  . insulin glargine (LANTUS) injection 10 Units  10 Units Subcutaneous Daily Juanito Doom, MD   10 Units at 01/24/15 [phone]  . iohexol (OMNIPAQUE) 350 MG/ML injection 80 mL  80 mL Intravenous Once PRN Leo Grosser, MD      . metoprolol tartrate (LOPRESSOR) tablet 25 mg  25 mg Oral BID Erma Heritage, [place]   25 mg at 01/24/15 2226  . nitroGLYCERIN 50 mg in dextrose 5 % 250 mL (0.2 mg/mL) infusion  2-200 mcg/min Intravenous Titrated Pixie Casino, MD 1.5 mL/hr at 01/24/15 1900 5 mcg/min at 01/24/15 1900  . ondansetron (ZOFRAN) injection 4 mg  4 mg Intravenous Q6H PRN Juanito Doom, MD      . sodium chloride 0.9 % injection 3 mL  3 mL Intravenous Q12H Pixie Casino, MD      . sodium chloride 0.9 % injection 3 mL  3 mL Intravenous PRN Pixie Casino,  MD      . traZODone (DESYREL) tablet 100 mg  100 mg Oral QHS PRN Juanito Doom, MD        Physical Exam: General appearance: alert and no distress Lungs: rales RLL Heart: regular rate and rhythm, S1, S2 normal, no murmur, click, rub or gallop Abdomen: soft, non-tender; bowel sounds normal; no masses,  no organomegaly Extremities: edema 1+ pitting edema Pulses: 2+ and symmetric Neurologic: Mental status: Alert, oriented, thought content appropriate  Lab Results: Results for orders placed or performed during the hospital encounter of 01/23/15 (from the past 48 hour(s))  CBC with Differential     Status: Abnormal   Collection Time: 01/23/15  8:42 AM  Result Value Ref Range   WBC 13.4 (H) 4.0 - 10.5 K/uL   RBC 5.47 4.22 - 5.81 MIL/uL   Hemoglobin 15.4 13.0 - 17.0 g/dL   HCT 45.1 39.0 - 52.0 %   MCV 82.4 78.0 - 100.0 fL   MCH 28.2 26.0 - 34.0 pg   MCHC 34.1 30.0 -  36.0 g/dL   RDW 13.4 11.5 - 15.5 %   Platelets 254 150 - 400 K/uL   Neutrophils Relative % 79 %   Neutro Abs 10.7 (H) 1.7 - 7.7 K/uL   Lymphocytes Relative 15 %   Lymphs Abs 2.0 0.7 - 4.0 K/uL   Monocytes Relative 4 %   Monocytes Absolute 0.5 0.1 - 1.0 K/uL   Eosinophils Relative 1 %   Eosinophils Absolute 0.2 0.0 - 0.7 K/uL   Basophils Relative 1 %   Basophils Absolute 0.1 0.0 - 0.1 K/uL  Brain natriuretic peptide     Status: Abnormal   Collection Time: 01/23/15  8:42 AM  Result Value Ref Range   B Natriuretic Peptide 395.7 (H) 0.0 - 100.0 pg/mL  Comprehensive metabolic panel     Status: Abnormal   Collection Time: 01/23/15  8:42 AM  Result Value Ref Range   Sodium 140 135 - 145 mmol/L   Potassium 3.5 3.5 - 5.1 mmol/L   Chloride 105 101 - 111 mmol/L   CO2 19 (L) 22 - 32 mmol/L   Glucose, Bld 428 (H) 65 - 99 mg/dL   BUN 12 6 - 20 mg/dL   Creatinine, Ser 1.38 (H) 0.61 - 1.24 mg/dL   Calcium 9.4 8.9 - 10.3 mg/dL   Total Protein 7.0 6.5 - 8.1 g/dL   Albumin 3.9 3.5 - 5.0 g/dL   AST 21 15 - 41 U/L   ALT 18 17 - 63 U/L   Alkaline Phosphatase 72 38 - 126 U/L   Total Bilirubin 0.8 0.3 - 1.2 mg/dL   GFR calc non Af Amer 52 (L) >60 mL/min   GFR calc Af Amer 60 (L) >60 mL/min    Comment: (NOTE) The eGFR has been calculated using the CKD EPI equation. This calculation has not been validated in all clinical situations. eGFR's persistently <60 mL/min signify possible Chronic Kidney Disease.    Anion gap 16 (H) 5 - 15  I-Stat arterial blood gas, ED     Status: Abnormal   Collection Time: 01/23/15  9:05 AM  Result Value Ref Range   pH, Arterial 7.348 (L) 7.350 - 7.450   pCO2 arterial 35.9 35.0 - 45.0 mmHg   pO2, Arterial 304.0 (H) 80.0 - 100.0 mmHg   Bicarbonate 19.7 (L) 20.0 - 24.0 mEq/L   TCO2 21 0 - 100 mmol/L   O2 Saturation 100.0 %   Acid-base deficit 5.0 (H)  0.0 - 2.0 mmol/L   Patient temperature 98.6 F    Collection site RADIAL, ALLEN'S TEST ACCEPTABLE    Drawn by RT      Sample type ARTERIAL   I-stat troponin, ED     Status: Abnormal   Collection Time: 01/23/15  9:17 AM  Result Value Ref Range   Troponin i, poc 0.25 (HH) 0.00 - 0.08 ng/mL   Comment NOTIFIED PHYSICIAN    Comment 3            Comment: Due to the release kinetics of cTnI, a negative result within the first hours of the onset of symptoms does not rule out myocardial infarction with certainty. If myocardial infarction is still suspected, repeat the test at appropriate intervals.   I-Stat CG4 Lactic Acid, ED     Status: Abnormal   Collection Time: 01/23/15  9:19 AM  Result Value Ref Range   Lactic Acid, Venous 5.34 (HH) 0.5 - 2.0 mmol/L   Comment NOTIFIED PHYSICIAN   Blood culture (routine x 2)     Status: None (Preliminary result)   Collection Time: 01/23/15  9:40 AM  Result Value Ref Range   Specimen Description BLOOD LEFT ANTECUBITAL    Special Requests      BOTTLES DRAWN AEROBIC AND ANAEROBIC 10CCS BLUE 5CC RED   Culture NO GROWTH 1 DAY    Report Status PENDING   Blood culture (routine x 2)     Status: None (Preliminary result)   Collection Time: 01/23/15  9:48 AM  Result Value Ref Range   Specimen Description BLOOD LEFT HAND    Special Requests BOTTLES DRAWN AEROBIC ONLY 10CCS    Culture NO GROWTH 1 DAY    Report Status PENDING   Urinalysis, Routine w reflex microscopic (not at [facility])     Status: Abnormal   Collection Time: 01/23/15 10:14 AM  Result Value Ref Range   Color, Urine YELLOW YELLOW   APPearance CLEAR CLEAR   Specific Gravity, Urine 1.030 1.005 - 1.030   pH 5.0 5.0 - 8.0   Glucose, UA >1000 (A) NEGATIVE mg/dL   Hgb urine dipstick NEGATIVE NEGATIVE   Bilirubin Urine NEGATIVE NEGATIVE   Ketones, ur 15 (A) NEGATIVE mg/dL   Protein, ur NEGATIVE NEGATIVE mg/dL   Nitrite NEGATIVE NEGATIVE   Leukocytes, UA NEGATIVE NEGATIVE  Urine culture     Status: None   Collection Time: 01/23/15 10:14 AM  Result Value Ref Range   Specimen Description URINE, CATHETERIZED     Special Requests NONE    Culture NO GROWTH 1 DAY    Report Status 01/24/2015 FINAL   Urine microscopic-add on     Status: Abnormal   Collection Time: 01/23/15 10:14 AM  Result Value Ref Range   Squamous Epithelial / LPF 0-5 (A) NONE SEEN   WBC, UA 0-5 0 - 5 WBC/hpf   RBC / HPF 0-5 0 - 5 RBC/hpf   Bacteria, UA RARE (A) NONE SEEN   Casts HYALINE CASTS (A) NEGATIVE   Urine-Other AMORPHOUS URATES/PHOSPHATES   CBG monitoring, ED     Status: Abnormal   Collection Time: 01/23/15 11:06 AM  Result Value Ref Range   Glucose-Capillary 393 (H) 65 - 99 mg/dL  CBG monitoring, ED     Status: Abnormal   Collection Time: 01/23/15 12:16 PM  Result Value Ref Range   Glucose-Capillary 347 (H) 65 - 99 mg/dL  MRSA PCR Screening     Status: None   Collection Time: 01/23/15 12:55 PM  Result Value Ref Range   MRSA by PCR NEGATIVE NEGATIVE    Comment:        The GeneXpert MRSA Assay (FDA approved for NASAL specimens only), is one component of a comprehensive MRSA colonization surveillance program. It is not intended to diagnose MRSA infection nor to guide or monitor treatment for MRSA infections.   CBC     Status: Abnormal   Collection Time: 01/23/15  2:10 PM  Result Value Ref Range   WBC 12.5 (H) 4.0 - 10.5 K/uL   RBC 4.70 4.22 - 5.81 MIL/uL   Hemoglobin 13.3 13.0 - 17.0 g/dL   HCT 37.9 (L) 39.0 - 52.0 %   MCV 80.6 78.0 - 100.0 fL   MCH 28.3 26.0 - 34.0 pg   MCHC 35.1 30.0 - 36.0 g/dL   RDW 13.2 11.5 - 15.5 %   Platelets 234 150 - 400 K/uL  Creatinine, serum     Status: Abnormal   Collection Time: 01/23/15  2:10 PM  Result Value Ref Range   Creatinine, Ser 1.38 (H) 0.61 - 1.24 mg/dL   GFR calc non Af Amer 52 (L) >60 mL/min   GFR calc Af Amer 60 (L) >60 mL/min    Comment: (NOTE) The eGFR has been calculated using the CKD EPI equation. This calculation has not been validated in all clinical situations. eGFR's persistently <60 mL/min signify possible Chronic Kidney Disease.   Lactic  acid, plasma     Status: Abnormal   Collection Time: 01/23/15  2:10 PM  Result Value Ref Range   Lactic Acid, Venous 3.1 (HH) 0.5 - 2.0 mmol/L    Comment: CRITICAL RESULT CALLED TO, READ BACK BY AND VERIFIED WITH: ALEXANDER,K RN @ 1503 01/23/15 LEONARD,A   Troponin I     Status: Abnormal   Collection Time: 01/23/15  2:10 PM  Result Value Ref Range   Troponin I 10.85 (HH) <0.031 ng/mL    Comment:        POSSIBLE MYOCARDIAL ISCHEMIA. SERIAL TESTING RECOMMENDED. CRITICAL RESULT CALLED TO, READ BACK BY AND VERIFIED WITH: ALEXANDER,K RN @ 1093 01/23/15 LEONARD,A   Hemoglobin A1c     Status: Abnormal   Collection Time: 01/23/15  2:10 PM  Result Value Ref Range   Hgb A1c MFr Bld 11.7 (H) 4.8 - 5.6 %    Comment: (NOTE)         Pre-diabetes: 5.7 - 6.4         Diabetes: >6.4         Glycemic control for adults with diabetes: <7.0    Mean Plasma Glucose 289 mg/dL    Comment: (NOTE) Performed At: [facility] [address] [place], [place] [id number] Lindon Romp MD UR:[id number]   TSH     Status: None   Collection Time: 01/23/15  2:10 PM  Result Value Ref Range   TSH 1.311 0.350 - 4.500 uIU/mL  Heparin level (unfractionated)     Status: None   Collection Time: 01/23/15  3:45 PM  Result Value Ref Range   Heparin Unfractionated 0.62 0.30 - 0.70 IU/mL    Comment:        IF HEPARIN RESULTS ARE BELOW EXPECTED VALUES, AND PATIENT DOSAGE HAS BEEN CONFIRMED, SUGGEST FOLLOW UP TESTING OF ANTITHROMBIN III LEVELS.   Glucose, capillary     Status: Abnormal   Collection Time: 01/23/15  5:17 PM  Result Value Ref Range   Glucose-Capillary 309 (H) 65 - 99 mg/dL  Basic metabolic panel  Status: Abnormal   Collection Time: 01/23/15  5:20 PM  Result Value Ref Range   Sodium 140 135 - 145 mmol/L   Potassium 4.1 3.5 - 5.1 mmol/L   Chloride 106 101 - 111 mmol/L   CO2 20 (L) 22 - 32 mmol/L   Glucose, Bld 349 (H) 65 - 99 mg/dL   BUN 14 6 - 20 mg/dL   Creatinine, Ser 1.35 (H)  0.61 - 1.24 mg/dL   Calcium 8.9 8.9 - 10.3 mg/dL   GFR calc non Af Amer 53 (L) >60 mL/min   GFR calc Af Amer >60 >60 mL/min    Comment: (NOTE) The eGFR has been calculated using the CKD EPI equation. This calculation has not been validated in all clinical situations. eGFR's persistently <60 mL/min signify possible Chronic Kidney Disease.    Anion gap 14 5 - 15  Troponin I     Status: Abnormal   Collection Time: 01/23/15  7:17 PM  Result Value Ref Range   Troponin I 28.64 (HH) <0.031 ng/mL    Comment:        POSSIBLE MYOCARDIAL ISCHEMIA. SERIAL TESTING RECOMMENDED. CRITICAL VALUE NOTED.  VALUE IS CONSISTENT WITH PREVIOUSLY REPORTED AND CALLED VALUE.   Glucose, capillary     Status: Abnormal   Collection Time: 01/23/15  8:04 PM  Result Value Ref Range   Glucose-Capillary 203 (H) 65 - 99 mg/dL   Comment 1 Capillary Specimen   Heparin level (unfractionated)     Status: Abnormal   Collection Time: 01/23/15 10:00 PM  Result Value Ref Range   Heparin Unfractionated 0.79 (H) 0.30 - 0.70 IU/mL    Comment:        IF HEPARIN RESULTS ARE BELOW EXPECTED VALUES, AND PATIENT DOSAGE HAS BEEN CONFIRMED, SUGGEST FOLLOW UP TESTING OF ANTITHROMBIN III LEVELS.   Glucose, capillary     Status: Abnormal   Collection Time: 01/23/15 11:44 PM  Result Value Ref Range   Glucose-Capillary 131 (H) 65 - 99 mg/dL   Comment 1 Capillary Specimen   Troponin I     Status: Abnormal   Collection Time: 01/24/15 12:27 AM  Result Value Ref Range   Troponin I 26.20 (HH) <0.031 ng/mL    Comment:        POSSIBLE MYOCARDIAL ISCHEMIA. SERIAL TESTING RECOMMENDED. CRITICAL VALUE NOTED.  VALUE IS CONSISTENT WITH PREVIOUSLY REPORTED AND CALLED VALUE.   Heparin level (unfractionated)     Status: None   Collection Time: 01/24/15 12:27 AM  Result Value Ref Range   Heparin Unfractionated 0.56 0.30 - 0.70 IU/mL    Comment:        IF HEPARIN RESULTS ARE BELOW EXPECTED VALUES, AND PATIENT DOSAGE HAS BEEN  CONFIRMED, SUGGEST FOLLOW UP TESTING OF ANTITHROMBIN III LEVELS.   CBC     Status: Abnormal   Collection Time: 01/24/15 12:27 AM  Result Value Ref Range   WBC 13.9 (H) 4.0 - 10.5 K/uL   RBC 4.51 4.22 - 5.81 MIL/uL   Hemoglobin 13.1 13.0 - 17.0 g/dL   HCT 36.6 (L) 39.0 - 52.0 %   MCV 81.2 78.0 - 100.0 fL   MCH 29.0 26.0 - 34.0 pg   MCHC 35.8 30.0 - 36.0 g/dL   RDW 13.4 11.5 - 15.5 %   Platelets 240 150 - 400 K/uL  Brain natriuretic peptide     Status: Abnormal   Collection Time: 01/24/15 12:27 AM  Result Value Ref Range   B Natriuretic Peptide 909.6 (H) 0.0 - 100.0  pg/mL  Basic metabolic panel     Status: Abnormal   Collection Time: 01/24/15 12:27 AM  Result Value Ref Range   Sodium 141 135 - 145 mmol/L   Potassium 3.3 (L) 3.5 - 5.1 mmol/L    Comment: DELTA CHECK NOTED   Chloride 107 101 - 111 mmol/L   CO2 24 22 - 32 mmol/L   Glucose, Bld 159 (H) 65 - 99 mg/dL   BUN 15 6 - 20 mg/dL   Creatinine, Ser 1.30 (H) 0.61 - 1.24 mg/dL   Calcium 8.5 (L) 8.9 - 10.3 mg/dL   GFR calc non Af Amer 56 (L) >60 mL/min   GFR calc Af Amer >60 >60 mL/min    Comment: (NOTE) The eGFR has been calculated using the CKD EPI equation. This calculation has not been validated in all clinical situations. eGFR's persistently <60 mL/min signify possible Chronic Kidney Disease.    Anion gap 10 5 - 15  Magnesium     Status: None   Collection Time: 01/24/15 12:27 AM  Result Value Ref Range   Magnesium 1.7 1.7 - 2.4 mg/dL  Phosphorus     Status: None   Collection Time: 01/24/15 12:27 AM  Result Value Ref Range   Phosphorus 3.9 2.5 - 4.6 mg/dL  Lipid panel     Status: Abnormal   Collection Time: 01/24/15 12:27 AM  Result Value Ref Range   Cholesterol 157 0 - 200 mg/dL   Triglycerides 66 <150 mg/dL   HDL 34 (L) >40 mg/dL   Total CHOL/HDL Ratio 4.6 RATIO   VLDL 13 0 - 40 mg/dL   LDL Cholesterol 110 (H) 0 - 99 mg/dL    Comment:        Total Cholesterol/HDL:CHD Risk Coronary Heart Disease Risk  Table                     Men   Women  1/2 Average Risk   3.4   3.3  Average Risk       5.0   4.4  2 X Average Risk   9.6   7.1  3 X Average Risk  23.4   11.0        Use the calculated Patient Ratio above and the CHD Risk Table to determine the patient's CHD Risk.        ATP III CLASSIFICATION (LDL):  <100     mg/dL   Optimal  100-129  mg/dL   Near or Above                    Optimal  130-159  mg/dL   Borderline  160-189  mg/dL   High  >190     mg/dL   Very High   Glucose, capillary     Status: Abnormal   Collection Time: 01/24/15  4:44 AM  Result Value Ref Range   Glucose-Capillary 171 (H) 65 - 99 mg/dL   Comment 1 Capillary Specimen   Glucose, capillary     Status: Abnormal   Collection Time: 01/24/15  8:32 AM  Result Value Ref Range   Glucose-Capillary 162 (H) 65 - 99 mg/dL   Comment 1 Capillary Specimen   Glucose, capillary     Status: Abnormal   Collection Time: 01/24/15 12:36 PM  Result Value Ref Range   Glucose-Capillary 186 (H) 65 - 99 mg/dL   Comment 1 Capillary Specimen   Glucose, capillary     Status: Abnormal   Collection Time:  01/24/15  4:40 PM  Result Value Ref Range   Glucose-Capillary 263 (H) 65 - 99 mg/dL   Comment 1 Capillary Specimen   Glucose, capillary     Status: Abnormal   Collection Time: 01/24/15  7:28 PM  Result Value Ref Range   Glucose-Capillary 160 (H) 65 - 99 mg/dL   Comment 1 Capillary Specimen   Glucose, capillary     Status: Abnormal   Collection Time: 01/25/15 12:19 AM  Result Value Ref Range   Glucose-Capillary 136 (H) 65 - 99 mg/dL   Comment 1 Capillary Specimen   Glucose, capillary     Status: Abnormal   Collection Time: 01/25/15  3:30 AM  Result Value Ref Range   Glucose-Capillary 134 (H) 65 - 99 mg/dL   Comment 1 Capillary Specimen   Heparin level (unfractionated)     Status: Abnormal   Collection Time: 01/25/15  4:05 AM  Result Value Ref Range   Heparin Unfractionated 0.20 (L) 0.30 - 0.70 IU/mL    Comment:        IF  HEPARIN RESULTS ARE BELOW EXPECTED VALUES, AND PATIENT DOSAGE HAS BEEN CONFIRMED, SUGGEST FOLLOW UP TESTING OF ANTITHROMBIN III LEVELS.   Basic metabolic panel     Status: Abnormal   Collection Time: 01/25/15  4:05 AM  Result Value Ref Range   Sodium 143 135 - 145 mmol/L   Potassium 3.7 3.5 - 5.1 mmol/L   Chloride 107 101 - 111 mmol/L   CO2 24 22 - 32 mmol/L   Glucose, Bld 135 (H) 65 - 99 mg/dL   BUN 15 6 - 20 mg/dL   Creatinine, Ser 1.21 0.61 - 1.24 mg/dL   Calcium 8.5 (L) 8.9 - 10.3 mg/dL   GFR calc non Af Amer >60 >60 mL/min   GFR calc Af Amer >60 >60 mL/min    Comment: (NOTE) The eGFR has been calculated using the CKD EPI equation. This calculation has not been validated in all clinical situations. eGFR's persistently <60 mL/min signify possible Chronic Kidney Disease.    Anion gap 12 5 - 15  CBC with Differential/Platelet     Status: Abnormal   Collection Time: 01/25/15  4:05 AM  Result Value Ref Range   WBC 8.5 4.0 - 10.5 K/uL   RBC 4.47 4.22 - 5.81 MIL/uL   Hemoglobin 12.6 (L) 13.0 - 17.0 g/dL   HCT 37.2 (L) 39.0 - 52.0 %   MCV 83.2 78.0 - 100.0 fL   MCH 28.2 26.0 - 34.0 pg   MCHC 33.9 30.0 - 36.0 g/dL   RDW 13.6 11.5 - 15.5 %   Platelets 204 150 - 400 K/uL   Neutrophils Relative % 69 %   Neutro Abs 6.0 1.7 - 7.7 K/uL   Lymphocytes Relative 18 %   Lymphs Abs 1.5 0.7 - 4.0 K/uL   Monocytes Relative 10 %   Monocytes Absolute 0.8 0.1 - 1.0 K/uL   Eosinophils Relative 2 %   Eosinophils Absolute 0.2 0.0 - 0.7 K/uL   Basophils Relative 1 %   Basophils Absolute 0.0 0.0 - 0.1 K/uL  Protime-INR     Status: None   Collection Time: 01/25/15  4:05 AM  Result Value Ref Range   Prothrombin Time 14.4 11.6 - 15.2 seconds   INR 1.10 0.00 - 1.49    Imaging: Dg Chest Port 1 View  01/23/2015  CLINICAL DATA:  Congestive heart failure EXAM: PORTABLE CHEST 1 VIEW COMPARISON:  None. FINDINGS: There is generalized interstitial edema  with patchy alveolar edema in the bases.  Heart is enlarged with pulmonary venous hypertension. No adenopathy. IMPRESSION: Evidence of congestive heart failure with edema most pronounced in the lung bases. Electronically Signed   By: Lowella Grip III M.D.   On: 01/23/2015 09:34    Assessment:  Principal Problem:   Acute systolic congestive heart failure, NYHA class 4 (HCC) Active Problems:   Acute respiratory failure with hypoxemia (HCC)   Hypertensive heart disease with congestive heart failure ([place])   Poorly controlled type 2 diabetes mellitus with peripheral neuropathy ([place])   Diabetic nephropathy associated with diabetes mellitus due to underlying condition (HCC)   NSTEMI (non-ST elevated myocardial infarction) ([place])   Mural thrombus of cardiac apex (HCC)   Dyslipidemia   Plan:  1. Plan for cardiac catheterization today. Increase lantus to 15U QHS per diabetic team recommendations - fasting BG was 135 today, which is much improved. Consider adding metformin prior to discharge. Lasix now discontinued. Additional diuresis based on RHC findings. He has an apical mural thrombus - would advise against crossing the aortic valve with LHC today.  Time Spent Directly with Patient:  15 minutes  Length of Stay:  LOS: 2 days   Pixie Casino, MD, [facility] Attending Cardiologist [place] 01/25/2015, 8:30 AM

## 2015-01-25 NOTE — Progress Notes (Signed)
PULMONARY / CRITICAL CARE MEDICINE   Name: Bradley Benitez MRN: [id number] DOB: [birth year]    ADMISSION DATE:  01/23/2015 CONSULTATION DATE:  01/23/2015  REFERRING MD:  Clydene PughKnott  CHIEF COMPLAINT:  Dyspnea  Brief description:  [name] was admitted on 12/28 with a CHF exacerbation after not going to the doctor for years.  He smoked 3 ppd for 20 years, quit 20 years ago, has 2 brothers who died of CHF.   SUBJECTIVE:  Diuresing  Oxygenation improving Going to cath lab now  VITAL SIGNS: BP 150/90 mmHg  Pulse 95  Temp(Src) 97.7 F (36.5 C) (Oral)  Resp 18  Ht 6\' 1"  (1.854 m)  Wt 102.604 kg (226 lb 3.2 oz)  BMI 29.85 kg/m2  SpO2 99%  HEMODYNAMICS:    VENTILATOR SETTINGS: Vent Mode:  [-]  FiO2 (%):  [30 %-55 %] 55 %  INTAKE / OUTPUT: I/O last 3 completed shifts: In: 883.9 [P.O.:360; I.V.:523.9] Out: 4675 [Urine:4675]  PHYSICAL EXAMINATION: General:  Chronically ill-appearing, no distress Neuro:  Awake alert, oriented 4, moves all 4 extremities HEENT:  Normocephalic atraumatic, oropharynx clear, extraocular movements intact Cardiovascular:  Tachycardic, regular rate and rhythm Lungs:  CTA on my exam, normal effort Abdomen:  Bowel sounds positive, soft, nontender Musculoskeletal:  Normal bulk and tone Skin:  Pitting edema improved, normal cap refill today, chronic shallow wounds feet bilaterally  LABS:  BMET  Recent Labs Lab 01/23/15 1720 01/24/15 0027 01/25/15 0405  NA 140 141 143  K 4.1 3.3* 3.7  CL 106 107 107  CO2 20* 24 24  BUN 14 15 15   CREATININE 1.35* 1.30* 1.21  GLUCOSE 349* 159* 135*    Electrolytes  Recent Labs Lab 01/23/15 1720 01/24/15 0027 01/25/15 0405  CALCIUM 8.9 8.5* 8.5*  MG  --  1.7  --   PHOS  --  3.9  --     CBC  Recent Labs Lab 01/23/15 1410 01/24/15 0027 01/25/15 0405  WBC 12.5* 13.9* 8.5  HGB 13.3 13.1 12.6*  HCT 37.9* 36.6* 37.2*  PLT 234 240 204    Coag's  Recent Labs Lab 01/25/15 0405  INR 1.10     Sepsis Markers  Recent Labs Lab 01/23/15 0919 01/23/15 1410  LATICACIDVEN 5.34* 3.1*    ABG  Recent Labs Lab 01/23/15 0905  PHART 7.348*  PCO2ART 35.9  PO2ART 304.0*    Liver Enzymes  Recent Labs Lab 01/23/15 0842  AST 21  ALT 18  ALKPHOS 72  BILITOT 0.8  ALBUMIN 3.9    Cardiac Enzymes  Recent Labs Lab 01/23/15 1410 01/23/15 1917 01/24/15 0027  TROPONINI 10.85* 28.64* 26.20*    Glucose  Recent Labs Lab 01/24/15 0832 01/24/15 1236 01/24/15 1640 01/24/15 1928 01/25/15 0019 01/25/15 0330  GLUCAP 162* 186* 263* 160* 136* 134*    Imaging No results found.   STUDIES:  01/23/2015 echocardiogram > LV 20-25%, anterior wall motion abnormality with clot  CULTURES: 01/23/2015 blood culture > 01/23/2015 urine culture  >  ANTIBIOTICS: 01/23/2015 vancomycin 1 01/23/2015 Zosyn 1  SIGNIFICANT EVENTS: [facility], cardiology consult  LINES/TUBES: 01/23/2015 BiPAP   DISCUSSION: 64 y/o male admitted 12/28 with acute systolic CHF in the setting of likely severe coronary artery disease.  Improved respiratory effort after diuresis last night, suspect he has at least LAD disease based on EKG.  Troponin elevation shows signs of myocardial infarct.  Oxygenation improving.  ASSESSMENT / PLAN:  PULMONARY A: Acute hypoxemic respiratory failure secondary to  acute pulmonary edema > improving P:   Maintain on venturi through cath, then back to nasal cannula Monitor O2 saturations, keep above 90%   CARDIOVASCULAR A:  Acute systolic congestive heart failure Coronary artery disease Age indeterminate myocardial infarct > troponin climbing overnight Hypertension, improved, diastolic still elevated Sinus tachycardia > improved with metoprolol P:  Aspirin Continue heparin gtt for now RHC/LHC per cardiology today Continue Metoprolol Telemetry monitoring  RENAL A:   Presumed chronic kidney disease, uncertain  baseline P:   Monitor BMET and UOP Replace electrolytes as needed  GASTROINTESTINAL A:   No acute issues P:   NPO for potential heart cath  HEMATOLOGIC A:   No acute issues  P:  Monitor for bleeding  INFECTIOUS A:   No evidence of infection P:   Monitor for fever  ENDOCRINE A:   Hyperglycemia, poorly controlled   P:   F/U hemoglobin A1c SSI Increase glargine today   NEUROLOGIC A:   Neuropathic pain secondary to poorly controlled diabetes P:   Consider wound care consult for foot ulcerations    FAMILY  - Updates: Family (niece, sister in law) updated at bedside 12/29; cardiology updated family 12/30   Cardiology to assume care, PCCM available as needed   Heber Dowling, MD [facility] Pager: [phone] Cell: [phone] After 3pm or if no response, call [phone]   01/25/2015, 9:04 AM

## 2015-01-25 NOTE — Progress Notes (Signed)
Reviewed cath films with Dr. Excell Seltzerooper. TCTS consulted for possible CABG. Ok to transfer to stepdown bed post-cath. I have placed orders.  Chrystie NoseKenneth C. Dorissa Stinnette, MD, [facility] CenterFACC Attending Cardiologist [facility]

## 2015-01-25 NOTE — Progress Notes (Signed)
ANTICOAGULATION CONSULT NOTE - Follow Up Consult  Pharmacy Consult for heparin Indication: NSTEMI  Allergies  Allergen Reactions  . Codeine Other (See Comments)    intolerance    Patient Measurements: Height: 6\' 1"  (185.4 cm) Weight: 226 lb 3.2 oz (102.604 kg) IBW/kg (Calculated) : 79.9 Heparin Dosing Weight: 100 kg  Vital Signs: Temp: 97.7 F (36.5 C) (12/30 0400) Temp Source: Oral (12/30 0400) BP: 121/85 mmHg (12/30 1020) Pulse Rate: 94 (12/30 1126)  Labs:  Recent Labs  01/23/15 1410  01/23/15 1720 01/23/15 1917 01/23/15 2200 01/24/15 0027 01/25/15 0405  HGB 13.3  --   --   --   --  13.1 12.6*  HCT 37.9*  --   --   --   --  36.6* 37.2*  PLT 234  --   --   --   --  240 204  LABPROT  --   --   --   --   --   --  14.4  INR  --   --   --   --   --   --  1.10  HEPARINUNFRC  --   < >  --   --  0.79* 0.56 0.20*  CREATININE 1.38*  --  1.35*  --   --  1.30* 1.21  TROPONINI 10.85*  --   --  28.64*  --  26.20*  --   < > = values in this interval not displayed.  Estimated Creatinine Clearance: 75.6 mL/min (by C-G formula based on Cr of 1.21).  Assessment: 64 y/o on heparin for NSTEMI. Heparin level now down to 0.2 (subtherapeutic) on 1350 units/hr. No issues with line or bleeding reported per RN.  Cath today shows multivessel CAD. CVTS has been consulted to determine best option, PCI vs CABG.  Heparin to resume this afternoon.  Goal of Therapy:  Heparin level 0.3-0.7 units/ml Monitor platelets by anticoagulation protocol: Yes   Plan:  Restart heparin gtt at 1550 units/hr this afternoon F/up 6 hour HL then daily Follow up cardiac plan  Sheppard CoilFrank Sariah Henkin PharmD., BCPS Clinical Pharmacist Pager [phone] 01/25/2015 12:01 PM

## 2015-01-25 NOTE — Progress Notes (Addendum)
DAILY PROGRESS NOTE  Subjective:  No events overnight. BG's remain mildly elevated. Diuresed an additional 1.6L negative - now out a total of 4.6L. Plan for [facility] today. Creatinine further improved to 1.21.  Objective:  Temp:  [97.5 F (36.4 C)-98.7 F (37.1 C)] 97.7 F (36.5 C) (12/30 0400) Pulse Rate:  [83-110] 95 (12/30 0753) Resp:  [16-28] 18 (12/30 0753) BP: (102-150)/(54-119) 150/90 mmHg (12/30 0700) SpO2:  [91 %-100 %] 99 % (12/30 0753) FiO2 (%):  [30 %-55 %] 55 % (12/30 0753) Weight:  [226 lb 3.2 oz (102.604 kg)] 226 lb 3.2 oz (102.604 kg) (12/30 0600) Weight change: -8 lb 3.2 oz (-3.719 kg)  Intake/Output from previous day: 12/29 0701 - 12/30 0700 In: 716.5 [P.O.:360; I.V.:356.5] Out: 2400 [Urine:2400]  Intake/Output from this shift:    Medications: Current Facility-Administered Medications  Medication Dose Route Frequency Provider Last Rate Last Dose  . 0.9 %  sodium chloride infusion  250 mL Intravenous PRN Juanito Doom, MD      . 0.9 %  sodium chloride infusion  250 mL Intravenous PRN Pixie Casino, MD      . 0.9 %  sodium chloride infusion   Intravenous Continuous Leonie Man, MD      . acetaminophen (TYLENOL) tablet 650 mg  650 mg Oral Q4H PRN Juanito Doom, MD      . antiseptic oral rinse (CPC / CETYLPYRIDINIUM CHLORIDE 0.05%) solution 7 mL  7 mL Mouth Rinse BID Juanito Doom, MD   7 mL at 01/24/15 1000  . aspirin chewable tablet 324 mg  324 mg Oral Once Leonie Man, MD      . Derrill Memo ON 01/26/2015] aspirin chewable tablet 324 mg  324 mg Oral Daily Juanito Doom, MD       Or  . Derrill Memo ON 01/26/2015] aspirin suppository 300 mg  300 mg Rectal Daily Juanito Doom, MD      . atorvastatin (LIPITOR) tablet 80 mg  80 mg Oral q1800 Erma Heritage, PA   80 mg at 01/24/15 1752  . heparin ADULT infusion 100 units/mL (25000 units/250 mL)  1,550 Units/hr Intravenous Continuous Franky Macho, RPH 15.5 mL/hr at 01/25/15 9794 1,550  Units/hr at 01/25/15 8016  . hydrALAZINE (APRESOLINE) injection 10-40 mg  10-40 mg Intravenous Q4H PRN Juanito Doom, MD      . insulin aspart (novoLOG) injection 0-15 Units  0-15 Units Subcutaneous 6 times per day Juanito Doom, MD   2 Units at 01/25/15 [phone]  . insulin glargine (LANTUS) injection 10 Units  10 Units Subcutaneous Daily Juanito Doom, MD   10 Units at 01/24/15 [phone]  . iohexol (OMNIPAQUE) 350 MG/ML injection 80 mL  80 mL Intravenous Once PRN Leo Grosser, MD      . metoprolol tartrate (LOPRESSOR) tablet 25 mg  25 mg Oral BID Erma Heritage, [place]   25 mg at 01/24/15 2226  . nitroGLYCERIN 50 mg in dextrose 5 % 250 mL (0.2 mg/mL) infusion  2-200 mcg/min Intravenous Titrated Pixie Casino, MD 1.5 mL/hr at 01/24/15 1900 5 mcg/min at 01/24/15 1900  . ondansetron (ZOFRAN) injection 4 mg  4 mg Intravenous Q6H PRN Juanito Doom, MD      . sodium chloride 0.9 % injection 3 mL  3 mL Intravenous Q12H Pixie Casino, MD      . sodium chloride 0.9 % injection 3 mL  3 mL Intravenous PRN Pixie Casino,  MD      . traZODone (DESYREL) tablet 100 mg  100 mg Oral QHS PRN Juanito Doom, MD        Physical Exam: General appearance: alert and no distress Lungs: rales RLL Heart: regular rate and rhythm, S1, S2 normal, no murmur, click, rub or gallop Abdomen: soft, non-tender; bowel sounds normal; no masses,  no organomegaly Extremities: edema 1+ pitting edema Pulses: 2+ and symmetric Neurologic: Mental status: Alert, oriented, thought content appropriate  Lab Results: Results for orders placed or performed during the hospital encounter of 01/23/15 (from the past 48 hour(s))  CBC with Differential     Status: Abnormal   Collection Time: 01/23/15  8:42 AM  Result Value Ref Range   WBC 13.4 (H) 4.0 - 10.5 K/uL   RBC 5.47 4.22 - 5.81 MIL/uL   Hemoglobin 15.4 13.0 - 17.0 g/dL   HCT 45.1 39.0 - 52.0 %   MCV 82.4 78.0 - 100.0 fL   MCH 28.2 26.0 - 34.0 pg   MCHC 34.1 30.0 -  36.0 g/dL   RDW 13.4 11.5 - 15.5 %   Platelets 254 150 - 400 K/uL   Neutrophils Relative % 79 %   Neutro Abs 10.7 (H) 1.7 - 7.7 K/uL   Lymphocytes Relative 15 %   Lymphs Abs 2.0 0.7 - 4.0 K/uL   Monocytes Relative 4 %   Monocytes Absolute 0.5 0.1 - 1.0 K/uL   Eosinophils Relative 1 %   Eosinophils Absolute 0.2 0.0 - 0.7 K/uL   Basophils Relative 1 %   Basophils Absolute 0.1 0.0 - 0.1 K/uL  Brain natriuretic peptide     Status: Abnormal   Collection Time: 01/23/15  8:42 AM  Result Value Ref Range   B Natriuretic Peptide 395.7 (H) 0.0 - 100.0 pg/mL  Comprehensive metabolic panel     Status: Abnormal   Collection Time: 01/23/15  8:42 AM  Result Value Ref Range   Sodium 140 135 - 145 mmol/L   Potassium 3.5 3.5 - 5.1 mmol/L   Chloride 105 101 - 111 mmol/L   CO2 19 (L) 22 - 32 mmol/L   Glucose, Bld 428 (H) 65 - 99 mg/dL   BUN 12 6 - 20 mg/dL   Creatinine, Ser 1.38 (H) 0.61 - 1.24 mg/dL   Calcium 9.4 8.9 - 10.3 mg/dL   Total Protein 7.0 6.5 - 8.1 g/dL   Albumin 3.9 3.5 - 5.0 g/dL   AST 21 15 - 41 U/L   ALT 18 17 - 63 U/L   Alkaline Phosphatase 72 38 - 126 U/L   Total Bilirubin 0.8 0.3 - 1.2 mg/dL   GFR calc non Af Amer 52 (L) >60 mL/min   GFR calc Af Amer 60 (L) >60 mL/min    Comment: (NOTE) The eGFR has been calculated using the CKD EPI equation. This calculation has not been validated in all clinical situations. eGFR's persistently <60 mL/min signify possible Chronic Kidney Disease.    Anion gap 16 (H) 5 - 15  I-Stat arterial blood gas, ED     Status: Abnormal   Collection Time: 01/23/15  9:05 AM  Result Value Ref Range   pH, Arterial 7.348 (L) 7.350 - 7.450   pCO2 arterial 35.9 35.0 - 45.0 mmHg   pO2, Arterial 304.0 (H) 80.0 - 100.0 mmHg   Bicarbonate 19.7 (L) 20.0 - 24.0 mEq/L   TCO2 21 0 - 100 mmol/L   O2 Saturation 100.0 %   Acid-base deficit 5.0 (H)  0.0 - 2.0 mmol/L   Patient temperature 98.6 F    Collection site RADIAL, ALLEN'S TEST ACCEPTABLE    Drawn by RT      Sample type ARTERIAL   I-stat troponin, ED     Status: Abnormal   Collection Time: 01/23/15  9:17 AM  Result Value Ref Range   Troponin i, poc 0.25 (HH) 0.00 - 0.08 ng/mL   Comment NOTIFIED PHYSICIAN    Comment 3            Comment: Due to the release kinetics of cTnI, a negative result within the first hours of the onset of symptoms does not rule out myocardial infarction with certainty. If myocardial infarction is still suspected, repeat the test at appropriate intervals.   I-Stat CG4 Lactic Acid, ED     Status: Abnormal   Collection Time: 01/23/15  9:19 AM  Result Value Ref Range   Lactic Acid, Venous 5.34 (HH) 0.5 - 2.0 mmol/L   Comment NOTIFIED PHYSICIAN   Blood culture (routine x 2)     Status: None (Preliminary result)   Collection Time: 01/23/15  9:40 AM  Result Value Ref Range   Specimen Description BLOOD LEFT ANTECUBITAL    Special Requests      BOTTLES DRAWN AEROBIC AND ANAEROBIC 10CCS BLUE 5CC RED   Culture NO GROWTH 1 DAY    Report Status PENDING   Blood culture (routine x 2)     Status: None (Preliminary result)   Collection Time: 01/23/15  9:48 AM  Result Value Ref Range   Specimen Description BLOOD LEFT HAND    Special Requests BOTTLES DRAWN AEROBIC ONLY 10CCS    Culture NO GROWTH 1 DAY    Report Status PENDING   Urinalysis, Routine w reflex microscopic (not at [facility])     Status: Abnormal   Collection Time: 01/23/15 10:14 AM  Result Value Ref Range   Color, Urine YELLOW YELLOW   APPearance CLEAR CLEAR   Specific Gravity, Urine 1.030 1.005 - 1.030   pH 5.0 5.0 - 8.0   Glucose, UA >1000 (A) NEGATIVE mg/dL   Hgb urine dipstick NEGATIVE NEGATIVE   Bilirubin Urine NEGATIVE NEGATIVE   Ketones, ur 15 (A) NEGATIVE mg/dL   Protein, ur NEGATIVE NEGATIVE mg/dL   Nitrite NEGATIVE NEGATIVE   Leukocytes, UA NEGATIVE NEGATIVE  Urine culture     Status: None   Collection Time: 01/23/15 10:14 AM  Result Value Ref Range   Specimen Description URINE, CATHETERIZED     Special Requests NONE    Culture NO GROWTH 1 DAY    Report Status 01/24/2015 FINAL   Urine microscopic-add on     Status: Abnormal   Collection Time: 01/23/15 10:14 AM  Result Value Ref Range   Squamous Epithelial / LPF 0-5 (A) NONE SEEN   WBC, UA 0-5 0 - 5 WBC/hpf   RBC / HPF 0-5 0 - 5 RBC/hpf   Bacteria, UA RARE (A) NONE SEEN   Casts HYALINE CASTS (A) NEGATIVE   Urine-Other AMORPHOUS URATES/PHOSPHATES   CBG monitoring, ED     Status: Abnormal   Collection Time: 01/23/15 11:06 AM  Result Value Ref Range   Glucose-Capillary 393 (H) 65 - 99 mg/dL  CBG monitoring, ED     Status: Abnormal   Collection Time: 01/23/15 12:16 PM  Result Value Ref Range   Glucose-Capillary 347 (H) 65 - 99 mg/dL  MRSA PCR Screening     Status: None   Collection Time: 01/23/15 12:55 PM  Result Value Ref Range   MRSA by PCR NEGATIVE NEGATIVE    Comment:        The GeneXpert MRSA Assay (FDA approved for NASAL specimens only), is one component of a comprehensive MRSA colonization surveillance program. It is not intended to diagnose MRSA infection nor to guide or monitor treatment for MRSA infections.   CBC     Status: Abnormal   Collection Time: 01/23/15  2:10 PM  Result Value Ref Range   WBC 12.5 (H) 4.0 - 10.5 K/uL   RBC 4.70 4.22 - 5.81 MIL/uL   Hemoglobin 13.3 13.0 - 17.0 g/dL   HCT 37.9 (L) 39.0 - 52.0 %   MCV 80.6 78.0 - 100.0 fL   MCH 28.3 26.0 - 34.0 pg   MCHC 35.1 30.0 - 36.0 g/dL   RDW 13.2 11.5 - 15.5 %   Platelets 234 150 - 400 K/uL  Creatinine, serum     Status: Abnormal   Collection Time: 01/23/15  2:10 PM  Result Value Ref Range   Creatinine, Ser 1.38 (H) 0.61 - 1.24 mg/dL   GFR calc non Af Amer 52 (L) >60 mL/min   GFR calc Af Amer 60 (L) >60 mL/min    Comment: (NOTE) The eGFR has been calculated using the CKD EPI equation. This calculation has not been validated in all clinical situations. eGFR's persistently <60 mL/min signify possible Chronic Kidney Disease.   Lactic  acid, plasma     Status: Abnormal   Collection Time: 01/23/15  2:10 PM  Result Value Ref Range   Lactic Acid, Venous 3.1 (HH) 0.5 - 2.0 mmol/L    Comment: CRITICAL RESULT CALLED TO, READ BACK BY AND VERIFIED WITH: ALEXANDER,K RN @ 1503 01/23/15 LEONARD,A   Troponin I     Status: Abnormal   Collection Time: 01/23/15  2:10 PM  Result Value Ref Range   Troponin I 10.85 (HH) <0.031 ng/mL    Comment:        POSSIBLE MYOCARDIAL ISCHEMIA. SERIAL TESTING RECOMMENDED. CRITICAL RESULT CALLED TO, READ BACK BY AND VERIFIED WITH: ALEXANDER,K RN @ 1093 01/23/15 LEONARD,A   Hemoglobin A1c     Status: Abnormal   Collection Time: 01/23/15  2:10 PM  Result Value Ref Range   Hgb A1c MFr Bld 11.7 (H) 4.8 - 5.6 %    Comment: (NOTE)         Pre-diabetes: 5.7 - 6.4         Diabetes: >6.4         Glycemic control for adults with diabetes: <7.0    Mean Plasma Glucose 289 mg/dL    Comment: (NOTE) Performed At: [facility] [address] [place], [place] [id number] Lindon Romp MD UR:[id number]   TSH     Status: None   Collection Time: 01/23/15  2:10 PM  Result Value Ref Range   TSH 1.311 0.350 - 4.500 uIU/mL  Heparin level (unfractionated)     Status: None   Collection Time: 01/23/15  3:45 PM  Result Value Ref Range   Heparin Unfractionated 0.62 0.30 - 0.70 IU/mL    Comment:        IF HEPARIN RESULTS ARE BELOW EXPECTED VALUES, AND PATIENT DOSAGE HAS BEEN CONFIRMED, SUGGEST FOLLOW UP TESTING OF ANTITHROMBIN III LEVELS.   Glucose, capillary     Status: Abnormal   Collection Time: 01/23/15  5:17 PM  Result Value Ref Range   Glucose-Capillary 309 (H) 65 - 99 mg/dL  Basic metabolic panel  Status: Abnormal   Collection Time: 01/23/15  5:20 PM  Result Value Ref Range   Sodium 140 135 - 145 mmol/L   Potassium 4.1 3.5 - 5.1 mmol/L   Chloride 106 101 - 111 mmol/L   CO2 20 (L) 22 - 32 mmol/L   Glucose, Bld 349 (H) 65 - 99 mg/dL   BUN 14 6 - 20 mg/dL   Creatinine, Ser 1.35 (H)  0.61 - 1.24 mg/dL   Calcium 8.9 8.9 - 10.3 mg/dL   GFR calc non Af Amer 53 (L) >60 mL/min   GFR calc Af Amer >60 >60 mL/min    Comment: (NOTE) The eGFR has been calculated using the CKD EPI equation. This calculation has not been validated in all clinical situations. eGFR's persistently <60 mL/min signify possible Chronic Kidney Disease.    Anion gap 14 5 - 15  Troponin I     Status: Abnormal   Collection Time: 01/23/15  7:17 PM  Result Value Ref Range   Troponin I 28.64 (HH) <0.031 ng/mL    Comment:        POSSIBLE MYOCARDIAL ISCHEMIA. SERIAL TESTING RECOMMENDED. CRITICAL VALUE NOTED.  VALUE IS CONSISTENT WITH PREVIOUSLY REPORTED AND CALLED VALUE.   Glucose, capillary     Status: Abnormal   Collection Time: 01/23/15  8:04 PM  Result Value Ref Range   Glucose-Capillary 203 (H) 65 - 99 mg/dL   Comment 1 Capillary Specimen   Heparin level (unfractionated)     Status: Abnormal   Collection Time: 01/23/15 10:00 PM  Result Value Ref Range   Heparin Unfractionated 0.79 (H) 0.30 - 0.70 IU/mL    Comment:        IF HEPARIN RESULTS ARE BELOW EXPECTED VALUES, AND PATIENT DOSAGE HAS BEEN CONFIRMED, SUGGEST FOLLOW UP TESTING OF ANTITHROMBIN III LEVELS.   Glucose, capillary     Status: Abnormal   Collection Time: 01/23/15 11:44 PM  Result Value Ref Range   Glucose-Capillary 131 (H) 65 - 99 mg/dL   Comment 1 Capillary Specimen   Troponin I     Status: Abnormal   Collection Time: 01/24/15 12:27 AM  Result Value Ref Range   Troponin I 26.20 (HH) <0.031 ng/mL    Comment:        POSSIBLE MYOCARDIAL ISCHEMIA. SERIAL TESTING RECOMMENDED. CRITICAL VALUE NOTED.  VALUE IS CONSISTENT WITH PREVIOUSLY REPORTED AND CALLED VALUE.   Heparin level (unfractionated)     Status: None   Collection Time: 01/24/15 12:27 AM  Result Value Ref Range   Heparin Unfractionated 0.56 0.30 - 0.70 IU/mL    Comment:        IF HEPARIN RESULTS ARE BELOW EXPECTED VALUES, AND PATIENT DOSAGE HAS BEEN  CONFIRMED, SUGGEST FOLLOW UP TESTING OF ANTITHROMBIN III LEVELS.   CBC     Status: Abnormal   Collection Time: 01/24/15 12:27 AM  Result Value Ref Range   WBC 13.9 (H) 4.0 - 10.5 K/uL   RBC 4.51 4.22 - 5.81 MIL/uL   Hemoglobin 13.1 13.0 - 17.0 g/dL   HCT 36.6 (L) 39.0 - 52.0 %   MCV 81.2 78.0 - 100.0 fL   MCH 29.0 26.0 - 34.0 pg   MCHC 35.8 30.0 - 36.0 g/dL   RDW 13.4 11.5 - 15.5 %   Platelets 240 150 - 400 K/uL  Brain natriuretic peptide     Status: Abnormal   Collection Time: 01/24/15 12:27 AM  Result Value Ref Range   B Natriuretic Peptide 909.6 (H) 0.0 - 100.0  pg/mL  Basic metabolic panel     Status: Abnormal   Collection Time: 01/24/15 12:27 AM  Result Value Ref Range   Sodium 141 135 - 145 mmol/L   Potassium 3.3 (L) 3.5 - 5.1 mmol/L    Comment: DELTA CHECK NOTED   Chloride 107 101 - 111 mmol/L   CO2 24 22 - 32 mmol/L   Glucose, Bld 159 (H) 65 - 99 mg/dL   BUN 15 6 - 20 mg/dL   Creatinine, Ser 1.30 (H) 0.61 - 1.24 mg/dL   Calcium 8.5 (L) 8.9 - 10.3 mg/dL   GFR calc non Af Amer 56 (L) >60 mL/min   GFR calc Af Amer >60 >60 mL/min    Comment: (NOTE) The eGFR has been calculated using the CKD EPI equation. This calculation has not been validated in all clinical situations. eGFR's persistently <60 mL/min signify possible Chronic Kidney Disease.    Anion gap 10 5 - 15  Magnesium     Status: None   Collection Time: 01/24/15 12:27 AM  Result Value Ref Range   Magnesium 1.7 1.7 - 2.4 mg/dL  Phosphorus     Status: None   Collection Time: 01/24/15 12:27 AM  Result Value Ref Range   Phosphorus 3.9 2.5 - 4.6 mg/dL  Lipid panel     Status: Abnormal   Collection Time: 01/24/15 12:27 AM  Result Value Ref Range   Cholesterol 157 0 - 200 mg/dL   Triglycerides 66 <150 mg/dL   HDL 34 (L) >40 mg/dL   Total CHOL/HDL Ratio 4.6 RATIO   VLDL 13 0 - 40 mg/dL   LDL Cholesterol 110 (H) 0 - 99 mg/dL    Comment:        Total Cholesterol/HDL:CHD Risk Coronary Heart Disease Risk  Table                     Men   Women  1/2 Average Risk   3.4   3.3  Average Risk       5.0   4.4  2 X Average Risk   9.6   7.1  3 X Average Risk  23.4   11.0        Use the calculated Patient Ratio above and the CHD Risk Table to determine the patient's CHD Risk.        ATP III CLASSIFICATION (LDL):  <100     mg/dL   Optimal  100-129  mg/dL   Near or Above                    Optimal  130-159  mg/dL   Borderline  160-189  mg/dL   High  >190     mg/dL   Very High   Glucose, capillary     Status: Abnormal   Collection Time: 01/24/15  4:44 AM  Result Value Ref Range   Glucose-Capillary 171 (H) 65 - 99 mg/dL   Comment 1 Capillary Specimen   Glucose, capillary     Status: Abnormal   Collection Time: 01/24/15  8:32 AM  Result Value Ref Range   Glucose-Capillary 162 (H) 65 - 99 mg/dL   Comment 1 Capillary Specimen   Glucose, capillary     Status: Abnormal   Collection Time: 01/24/15 12:36 PM  Result Value Ref Range   Glucose-Capillary 186 (H) 65 - 99 mg/dL   Comment 1 Capillary Specimen   Glucose, capillary     Status: Abnormal   Collection Time:  01/24/15  4:40 PM  Result Value Ref Range   Glucose-Capillary 263 (H) 65 - 99 mg/dL   Comment 1 Capillary Specimen   Glucose, capillary     Status: Abnormal   Collection Time: 01/24/15  7:28 PM  Result Value Ref Range   Glucose-Capillary 160 (H) 65 - 99 mg/dL   Comment 1 Capillary Specimen   Glucose, capillary     Status: Abnormal   Collection Time: 01/25/15 12:19 AM  Result Value Ref Range   Glucose-Capillary 136 (H) 65 - 99 mg/dL   Comment 1 Capillary Specimen   Glucose, capillary     Status: Abnormal   Collection Time: 01/25/15  3:30 AM  Result Value Ref Range   Glucose-Capillary 134 (H) 65 - 99 mg/dL   Comment 1 Capillary Specimen   Heparin level (unfractionated)     Status: Abnormal   Collection Time: 01/25/15  4:05 AM  Result Value Ref Range   Heparin Unfractionated 0.20 (L) 0.30 - 0.70 IU/mL    Comment:        IF  HEPARIN RESULTS ARE BELOW EXPECTED VALUES, AND PATIENT DOSAGE HAS BEEN CONFIRMED, SUGGEST FOLLOW UP TESTING OF ANTITHROMBIN III LEVELS.   Basic metabolic panel     Status: Abnormal   Collection Time: 01/25/15  4:05 AM  Result Value Ref Range   Sodium 143 135 - 145 mmol/L   Potassium 3.7 3.5 - 5.1 mmol/L   Chloride 107 101 - 111 mmol/L   CO2 24 22 - 32 mmol/L   Glucose, Bld 135 (H) 65 - 99 mg/dL   BUN 15 6 - 20 mg/dL   Creatinine, Ser 1.21 0.61 - 1.24 mg/dL   Calcium 8.5 (L) 8.9 - 10.3 mg/dL   GFR calc non Af Amer >60 >60 mL/min   GFR calc Af Amer >60 >60 mL/min    Comment: (NOTE) The eGFR has been calculated using the CKD EPI equation. This calculation has not been validated in all clinical situations. eGFR's persistently <60 mL/min signify possible Chronic Kidney Disease.    Anion gap 12 5 - 15  CBC with Differential/Platelet     Status: Abnormal   Collection Time: 01/25/15  4:05 AM  Result Value Ref Range   WBC 8.5 4.0 - 10.5 K/uL   RBC 4.47 4.22 - 5.81 MIL/uL   Hemoglobin 12.6 (L) 13.0 - 17.0 g/dL   HCT 37.2 (L) 39.0 - 52.0 %   MCV 83.2 78.0 - 100.0 fL   MCH 28.2 26.0 - 34.0 pg   MCHC 33.9 30.0 - 36.0 g/dL   RDW 13.6 11.5 - 15.5 %   Platelets 204 150 - 400 K/uL   Neutrophils Relative % 69 %   Neutro Abs 6.0 1.7 - 7.7 K/uL   Lymphocytes Relative 18 %   Lymphs Abs 1.5 0.7 - 4.0 K/uL   Monocytes Relative 10 %   Monocytes Absolute 0.8 0.1 - 1.0 K/uL   Eosinophils Relative 2 %   Eosinophils Absolute 0.2 0.0 - 0.7 K/uL   Basophils Relative 1 %   Basophils Absolute 0.0 0.0 - 0.1 K/uL  Protime-INR     Status: None   Collection Time: 01/25/15  4:05 AM  Result Value Ref Range   Prothrombin Time 14.4 11.6 - 15.2 seconds   INR 1.10 0.00 - 1.49    Imaging: Dg Chest Port 1 View  01/23/2015  CLINICAL DATA:  Congestive heart failure EXAM: PORTABLE CHEST 1 VIEW COMPARISON:  None. FINDINGS: There is generalized interstitial edema  with patchy alveolar edema in the bases.  Heart is enlarged with pulmonary venous hypertension. No adenopathy. IMPRESSION: Evidence of congestive heart failure with edema most pronounced in the lung bases. Electronically Signed   By: Lowella Grip III M.D.   On: 01/23/2015 09:34    Assessment:  Principal Problem:   Acute systolic congestive heart failure, NYHA class 4 (HCC) Active Problems:   Acute respiratory failure with hypoxemia (HCC)   Hypertensive heart disease with congestive heart failure ([place])   Poorly controlled type 2 diabetes mellitus with peripheral neuropathy ([place])   Diabetic nephropathy associated with diabetes mellitus due to underlying condition (HCC)   NSTEMI (non-ST elevated myocardial infarction) ([place])   Mural thrombus of cardiac apex (HCC)   Dyslipidemia   Plan:  1. Plan for cardiac catheterization today. Increase lantus to 15U QHS per diabetic team recommendations - fasting BG was 135 today, which is much improved. Consider adding metformin prior to discharge. Lasix now discontinued. Additional diuresis based on RHC findings. He has an apical mural thrombus - would advise against crossing the aortic valve with LHC today.  Time Spent Directly with Patient:  15 minutes  Length of Stay:  LOS: 2 days   Pixie Casino, MD, [facility] Attending Cardiologist [place] 01/25/2015, 8:30 AM

## 2015-01-25 NOTE — Progress Notes (Signed)
ANTICOAGULATION CONSULT NOTE - Follow Up Consult  Pharmacy Consult for Heparin  Indication: Multi-vessel CAD, awaiting CVTS consult  Allergies  Allergen Reactions  . Codeine Other (See Comments)    intolerance   Patient Measurements: Height: 6\' 1"  (185.4 cm) Weight: 226 lb 3.2 oz (102.604 kg) IBW/kg (Calculated) : 79.9  Vital Signs: Temp: 97.9 F (36.6 C) (12/30 2311) Temp Source: Oral (12/30 2311) BP: 97/61 mmHg (12/30 2311) Pulse Rate: 91 (12/30 2311)  Labs:  Recent Labs  01/23/15 1410  01/23/15 1720 01/23/15 1917  01/24/15 0027 01/25/15 0405 01/25/15 2241  HGB 13.3  --   --   --   --  13.1 12.6*  --   HCT 37.9*  --   --   --   --  36.6* 37.2*  --   PLT 234  --   --   --   --  240 204  --   LABPROT  --   --   --   --   --   --  14.4  --   INR  --   --   --   --   --   --  1.10  --   HEPARINUNFRC  --   < >  --   --   < > 0.56 0.20* 0.23*  CREATININE 1.38*  --  1.35*  --   --  1.30* 1.21  --   TROPONINI 10.85*  --   --  28.64*  --  26.20*  --   --   < > = values in this interval not displayed.  Estimated Creatinine Clearance: 75.6 mL/min (by C-G formula based on Cr of 1.21).  Assessment: Heparin level is sub-therapeutic after re-start s/p cath, no issues per RN.   Goal of Therapy:  Heparin level 0.3-0.7 units/ml Monitor platelets by anticoagulation protocol: Yes   Plan:  -Increase heparin drip to 1750 units/hr -0800 HL  Abran DukeLedford, Rosalina Dingwall 01/25/2015,11:49 PM

## 2015-01-25 NOTE — Interval H&P Note (Signed)
History and Physical Interval Note:  01/25/2015 9:27 AM  Bradley Benitez  has presented today for surgery, with the diagnosis of cp  The various methods of treatment have been discussed with the patient and family. After consideration of risks, benefits and other options for treatment, the patient has consented to  Procedure(s): Right/Left Heart Cath and Coronary Angiography (N/A) as a surgical intervention .  The patient's history has been reviewed, patient examined, no change in status, stable for surgery.  I have reviewed the patient's chart and labs.  Questions were answered to the patient's satisfaction.    Cath Lab Visit (complete for each Cath Lab visit)  Clinical Evaluation Leading to the Procedure:   ACS: Yes.    Non-ACS:    Anginal Classification: No Symptoms  Anti-ischemic medical therapy: Minimal Therapy (1 class of medications)  Non-Invasive Test Results: No non-invasive testing performed  Prior CABG: No previous CABG       Bradley Benitez, Bradley Benitez

## 2015-01-25 NOTE — Progress Notes (Signed)
ANTICOAGULATION CONSULT NOTE - Follow Up Consult  Pharmacy Consult for heparin Indication: NSTEMI  Allergies  Allergen Reactions  . Codeine Other (See Comments)    intolerance    Patient Measurements: Height: 6\' 1"  (185.4 cm) Weight: 221 lb 1.9 oz (100.3 kg) IBW/kg (Calculated) : 79.9 Heparin Dosing Weight: 100 kg  Vital Signs: Temp: 97.7 F (36.5 C) (12/30 0400) Temp Source: Oral (12/30 0400) BP: 117/80 mmHg (12/30 0500) Pulse Rate: 86 (12/30 0500)  Labs:  Recent Labs  01/23/15 1410  01/23/15 1720 01/23/15 1917 01/23/15 2200 01/24/15 0027 01/25/15 0405  HGB 13.3  --   --   --   --  13.1 12.6*  HCT 37.9*  --   --   --   --  36.6* 37.2*  PLT 234  --   --   --   --  240 204  LABPROT  --   --   --   --   --   --  14.4  INR  --   --   --   --   --   --  1.10  HEPARINUNFRC  --   < >  --   --  0.79* 0.56 0.20*  CREATININE 1.38*  --  1.35*  --   --  1.30*  --   TROPONINI 10.85*  --   --  28.64*  --  26.20*  --   < > = values in this interval not displayed.  Estimated Creatinine Clearance: 69.7 mL/min (by C-G formula based on Cr of 1.3).  Assessment: 64 y/o on heparin for NSTEMI. Heparin level now down to 0.2 (subtherapeutic) on 1350 units/hr. No issues with line or bleeding reported per RN. Plan for cath today - scheduled for 1030. CBC stable.   Goal of Therapy:  Heparin level 0.3-0.7 units/ml Monitor platelets by anticoagulation protocol: Yes   Plan:  Increase heparin gtt to 1550 units/hr F/u post cath  Christoper Fabianaron Rishabh Rinkenberger, PharmD, BCPS Clinical pharmacist, pager [phone] 01/25/2015,6:01 AM

## 2015-01-25 NOTE — Care Management Important Message (Signed)
Important Message  Patient Details  Name: Bradley PilaDavid M Trulock MRN: [id number] Date of Birth: 1950/04/09   Medicare Important Message Given:  Yes    Jadira Nierman P Cassey Bacigalupo 01/25/2015, 1:58 PM

## 2015-01-26 ENCOUNTER — Inpatient Hospital Stay (HOSPITAL_COMMUNITY): Payer: PPO

## 2015-01-26 DIAGNOSIS — R0602 Shortness of breath: Secondary | ICD-10-CM

## 2015-01-26 DIAGNOSIS — E785 Hyperlipidemia, unspecified: Secondary | ICD-10-CM

## 2015-01-26 DIAGNOSIS — I5021 Acute systolic (congestive) heart failure: Secondary | ICD-10-CM

## 2015-01-26 DIAGNOSIS — I2510 Atherosclerotic heart disease of native coronary artery without angina pectoris: Secondary | ICD-10-CM

## 2015-01-26 DIAGNOSIS — E0821 Diabetes mellitus due to underlying condition with diabetic nephropathy: Secondary | ICD-10-CM

## 2015-01-26 LAB — URINALYSIS, ROUTINE W REFLEX MICROSCOPIC
Bilirubin Urine: NEGATIVE
Glucose, UA: NEGATIVE mg/dL
Ketones, ur: 15 mg/dL — AB
Leukocytes, UA: NEGATIVE
Nitrite: NEGATIVE
Protein, ur: NEGATIVE mg/dL
Specific Gravity, Urine: 1.018 (ref 1.005–1.030)
pH: 5 (ref 5.0–8.0)

## 2015-01-26 LAB — GLUCOSE, CAPILLARY
GLUCOSE-CAPILLARY: 140 mg/dL — AB (ref 65–99)
GLUCOSE-CAPILLARY: 203 mg/dL — AB (ref 65–99)
Glucose-Capillary: 114 mg/dL — ABNORMAL HIGH (ref 65–99)
Glucose-Capillary: 163 mg/dL — ABNORMAL HIGH (ref 65–99)
Glucose-Capillary: 155 mg/dL — ABNORMAL HIGH (ref 65–99)

## 2015-01-26 LAB — CBC
HCT: 36.7 % — ABNORMAL LOW (ref 39.0–52.0)
Hemoglobin: 12.5 g/dL — ABNORMAL LOW (ref 13.0–17.0)
MCH: 28.3 pg (ref 26.0–34.0)
MCHC: 34.1 g/dL (ref 30.0–36.0)
MCV: 83 fL (ref 78.0–100.0)
PLATELETS: 202 10*3/uL (ref 150–400)
RBC: 4.42 MIL/uL (ref 4.22–5.81)
RDW: 13.2 % (ref 11.5–15.5)
WBC: 7.1 10*3/uL (ref 4.0–10.5)

## 2015-01-26 LAB — URINE MICROSCOPIC-ADD ON: Bacteria, UA: NONE SEEN

## 2015-01-26 LAB — POCT I-STAT 3, ART BLOOD GAS (G3+)
BICARBONATE: 24.8 meq/L — AB (ref 20.0–24.0)
O2 SAT: 97 %
PH ART: 7.414 (ref 7.350–7.450)
TCO2: 26 mmol/L (ref 0–100)
pCO2 arterial: 38.7 mmHg (ref 35.0–45.0)
pO2, Arterial: 87 mmHg (ref 80.0–100.0)

## 2015-01-26 LAB — COMPREHENSIVE METABOLIC PANEL
ALT: 20 U/L (ref 17–63)
AST: 28 U/L (ref 15–41)
Albumin: 3.3 g/dL — ABNORMAL LOW (ref 3.5–5.0)
Alkaline Phosphatase: 64 U/L (ref 38–126)
Anion gap: 10 (ref 5–15)
BUN: 15 mg/dL (ref 6–20)
CO2: 24 mmol/L (ref 22–32)
Calcium: 9 mg/dL (ref 8.9–10.3)
Chloride: 107 mmol/L (ref 101–111)
Creatinine, Ser: 1.1 mg/dL (ref 0.61–1.24)
GFR calc Af Amer: >60 mL/min (ref >60)
GFR calc non Af Amer: >60 mL/min (ref >60)
Glucose, Bld: 209 mg/dL — ABNORMAL HIGH (ref 65–99)
Potassium: 3.6 mmol/L (ref 3.5–5.1)
Sodium: 141 mmol/L (ref 135–145)
Total Bilirubin: 0.5 mg/dL (ref 0.3–1.2)
Total Protein: 6.6 g/dL (ref 6.5–8.1)

## 2015-01-26 LAB — TROPONIN I: Troponin I: 5.3 ng/mL (ref <0.031)

## 2015-01-26 LAB — HEPARIN LEVEL (UNFRACTIONATED)
HEPARIN UNFRACTIONATED: 0.24 [IU]/mL — AB (ref 0.30–0.70)
Heparin Unfractionated: 0.44 IU/mL (ref 0.30–0.70)

## 2015-01-26 MED ORDER — HEPARIN (PORCINE) IN NACL 100-0.45 UNIT/ML-% IJ SOLN
1950.0000 [IU]/h | INTRAMUSCULAR | Status: DC
  Administered 2015-01-27 – 2015-01-28 (×4): 1950 [IU]/h via INTRAVENOUS
  Filled 2015-01-26 (×4): qty 250

## 2015-01-26 NOTE — Progress Notes (Signed)
Patient Name: Bradley Benitez Date of Encounter: 01/26/2015  Principal Problem:   Acute systolic congestive heart failure, NYHA class 4 (HCC) Active Problems:   Acute respiratory failure with hypoxemia (HCC)   Hypertensive heart disease with congestive heart failure (HCC)   Poorly controlled type 2 diabetes mellitus with peripheral neuropathy (HCC)   Diabetic nephropathy associated with diabetes mellitus due to underlying condition (HCC)   NSTEMI (non-ST elevated myocardial infarction) (HCC)   Mural thrombus of cardiac apex (HCC)   Dyslipidemia   Length of Stay: 3  SUBJECTIVE  No events overnight. diuresed -200, total of 4.8L. Plan for CT surgery consult this weekend for consideration of CABG. Denies CP oe SOB.   CURRENT MEDS . antiseptic oral rinse  7 mL Mouth Rinse BID  . aspirin  324 mg Oral Daily   Or  . aspirin  300 mg Rectal Daily  . atorvastatin  80 mg Oral q1800  . budesonide-formoterol  2 puff Inhalation BID  . furosemide  40 mg Intravenous BID  . insulin aspart  0-15 Units Subcutaneous 6 times per day  . insulin glargine  15 Units Subcutaneous Daily  . metoprolol tartrate  25 mg Oral BID  . sodium chloride  3 mL Intravenous Q12H  . sodium chloride  3 mL Intravenous Q12H   . heparin 1,750 Units/hr (01/25/15 2359)  . nitroGLYCERIN 5 mcg/min (01/25/15 1900)    OBJECTIVE  Filed Vitals:   01/26/15 0500 01/26/15 0600 01/26/15 0700 01/26/15 0740  BP: 95/56 103/68 117/79   Pulse: 78 77 83   Temp:    98.2 F (36.8 C)  TempSrc:    Oral  Resp: Height:      Weight:      SpO2: 100% 100% 99%     Intake/Output Summary (Last 24 hours) at 01/26/15 0948 Last data filed at 01/26/15 0700  Gross per 24 hour  Intake 422.04 ml  Output    675 ml  Net -252.96 ml   Filed Weights   01/24/15 0409 01/25/15 0600 01/26/15 0326  Weight: 221 lb 1.9 oz (100.3 kg) 226 lb 3.2 oz (102.604 kg) 226 lb 3.1 oz (102.6 kg)    PHYSICAL EXAM  Physical  Exam: General appearance: alert and no distress Lungs: rales RLL Heart: regular rate and rhythm, S1, S2 normal, no murmur, click, rub or gallop Abdomen: soft, non-tender; bowel sounds normal; no masses,  no organomegaly Extremities: edema 1+ pitting edema in his feet, R>L Pulses: 2+ and symmetric Neurologic: Mental status: Alert, oriented, thought content appropriate   Accessory Clinical Findings  CBC  Recent Labs  01/25/15 0405 01/26/15 0315  WBC 8.5 7.1  NEUTROABS 6.0  --   HGB 12.6* 12.5*  HCT 37.2* 36.7*  MCV 83.2 83.0  PLT 204 202   Basic Metabolic Panel  Recent Labs  01/24/15 0027 01/25/15 0405  NA 141 143  K 3.3* 3.7  CL 107 107  CO2 24 24  GLUCOSE 159* 135*  BUN 15 15  CREATININE 1.30* 1.21  CALCIUM 8.5* 8.5*  MG 1.7  --   PHOS 3.9  --     Recent Labs  01/23/15 1917 01/24/15 0027 01/26/15 0315  TROPONINI 28.64* 26.20* 5.30*   Hemoglobin A1C  Recent Labs  01/23/15 1410  HGBA1C 11.7*   Fasting Lipid Panel  Recent Labs  01/24/15 0027  CHOL 157  HDL 34*  LDLCALC 110*  TRIG 66  CHOLHDL 4.6  Thyroid Function Tests  Recent Labs  01/23/15 1410  TSH 1.311    Radiology/Studies  Dg Chest Port 1 View  01/25/2015  CLINICAL DATA:  Acute respiratory failure with hypoxemia.  Fever. EXAM: PORTABLE CHEST 1 VIEW COMPARISON:  01/23/2015. FINDINGS: Grossly stable enlarged cardiac silhouette. Prominent pulmonary vasculature and interstitial markings with improvement. Interval small amount of linear density in the right mid lung zone. Minimal right pleural fluid. Central peribronchial thickening. Unremarkable bones. IMPRESSION: 1. Improving changes of congestive heart failure. 2. Small amount of linear atelectasis in the right mid lung zone. 3. Moderate bronchitic changes. Electronically Signed   By: Beckie SaltsSteven  Reid M.D.   On: 01/25/2015 16:34   Dg Chest Port 1 View  01/23/2015  CLINICAL DATA:  Congestive heart failure EXAM: PORTABLE CHEST 1 VIEW  COMPARISON:  None. FINDINGS: There is generalized interstitial edema with patchy alveolar edema in the bases. Heart is enlarged with pulmonary venous hypertension. No adenopathy. IMPRESSION: Evidence of congestive heart failure with edema most pronounced in the lung bases. Electronically Signed   By: Bretta BangWilliam  Woodruff III M.D.   On: 01/23/2015 09:34     ASSESSMENT AND PLAN  1. CAD, NSTEMI - LHC yesterday showed Severe 3 vessel CAD, suspect LAD is culprit for NSTEMI, but severe multivessel disease is present, known severe LV dysfunction, after reviewing his films, it was determined that he has surgical anatomy (3 vessel disease, severe LV dysfunction with heart failure, and diabetes), but not sure that he's a good candidate based on multiple comorbidities. Will ask for a TCTS consultation. - continue ASA, atorvastatin, metoprolol  2. Acute systolic congestive heart failure, NYHA class 4 (HCC) - improving, negative 5 L this hospitalization  3. Hypertensive heart disease with congestive heart failure (HCC) - controlled  4. ARI - improving  5. Ischemic cardiomyopathy - ACEI/ARB on hold for ARI  6.  Mural thrombus of cardiac apex (HCC) on iv Heparin  7. Dyslipidemia   Signed, Lars MassonNELSON, Cecilia Nishikawa H MD, [facility] IncFACC 01/26/2015

## 2015-01-26 NOTE — Progress Notes (Signed)
ANTICOAGULATION CONSULT NOTE - Follow Up Consult  Pharmacy Consult for heparin Indication: Multi-vessel, CAD, awaiting CVTS consult  Allergies  Allergen Reactions  . Codeine Other (See Comments)    intolerance    Patient Measurements: Height: 6\' 1"  (185.4 cm) Weight: 226 lb 3.1 oz (102.6 kg) IBW/kg (Calculated) : 79.9 Heparin Dosing Weight: 100 kg  Vital Signs: Temp: 98.9 F (37.2 C) (12/31 1937) Temp Source: Oral (12/31 1937) BP: 134/90 mmHg (12/31 2000) Pulse Rate: 94 (12/31 2000)  Labs:  Recent Labs  01/24/15 0027 01/25/15 0405 01/25/15 2241 01/26/15 0315 01/26/15 1115 01/26/15 2012  HGB 13.1 12.6*  --  12.5*  --   --   HCT 36.6* 37.2*  --  36.7*  --   --   PLT 240 204  --  202  --   --   LABPROT  --  14.4  --   --   --   --   INR  --  1.10  --   --   --   --   HEPARINUNFRC 0.56 0.20* 0.23*  --  0.24* 0.44  CREATININE 1.30* 1.21  --   --  1.10  --   TROPONINI 26.20*  --   --  5.30*  --   --     Estimated Creatinine Clearance: 83.2 mL/min (by C-G formula based on Cr of 1.1).   Assessment: 64 y/o on heparin for NSTEMI. Cath today shows multivessel CAD. CVTS has declared he is not adequate for CABG at this point. Plan to optimize HF therapies and reassess later. Now HL therapeutic at 0.44 after rate increase. Hgb stable at 12.5, plts wnl. No s/s of bleed.  Goal of Therapy:  Heparin level 0.3-0.7 units/ml Monitor platelets by anticoagulation protocol: Yes   Plan:  Continue heparin gtt at 1950 units/hr Check 6 hr confirmatory HL Monitor daily HL, CBC, s/s of bleed  Enzo BiNathan Enrico Eaddy, PharmD, [facility] HospitalBCPS Clinical Pharmacist Pager [phone] 01/26/2015 9:01 PM

## 2015-01-26 NOTE — Progress Notes (Signed)
Entered in error
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
## 2015-01-26 NOTE — Progress Notes (Signed)
ANTICOAGULATION CONSULT NOTE - Follow Up Consult  Pharmacy Consult for Heparin  Indication: Multi-vessel CAD, awaiting CVTS consult  Allergies  Allergen Reactions  . Codeine Other (See Comments)    intolerance   Patient Measurements: Height: 6\' 1"  (185.4 cm) Weight: 226 lb 3.1 oz (102.6 kg) IBW/kg (Calculated) : 79.9  Vital Signs: Temp: 98.3 F (36.8 C) (12/31 1136) Temp Source: Oral (12/31 1136) BP: 135/94 mmHg (12/31 1200) Pulse Rate: 99 (12/31 1200)  Labs:  Recent Labs  01/23/15 1917  01/24/15 0027 01/25/15 0405 01/25/15 2241 01/26/15 0315 01/26/15 1115  HGB  --   --  13.1 12.6*  --  12.5*  --   HCT  --   --  36.6* 37.2*  --  36.7*  --   PLT  --   --  240 204  --  202  --   LABPROT  --   --   --  14.4  --   --   --   INR  --   --   --  1.10  --   --   --   HEPARINUNFRC  --   < > 0.56 0.20* 0.23*  --  0.24*  CREATININE  --   --  1.30* 1.21  --   --  1.10  TROPONINI 28.64*  --  26.20*  --   --  5.30*  --   < > = values in this interval not displayed.  Estimated Creatinine Clearance: 83.2 mL/min (by C-G formula based on Cr of 1.1).  Assessment: 64 y/o on heparin for NSTEMI.  Heparin level still low at 0.24 (subtherapeutic) on 1750 units/hr. No issues with line or bleeding reported per RN.  Cath today shows multivessel CAD. CVTS has declared he is not adequate for CABG at this point. Plan to optimize HF therapies and reassess later.   Goal of Therapy:  Heparin level 0.3-0.7 units/ml Monitor platelets by anticoagulation protocol: Yes   Plan:  -Increase heparin drip to 1950 units/hr -2000 HL  Sheppard CoilFrank Jullisa Grigoryan PharmD., BCPS Clinical Pharmacist Pager [phone] 01/26/2015 12:59 PM

## 2015-01-26 NOTE — Progress Notes (Signed)
*  Preliminary Results* Bilateral lower extremity venous duplex completed. Bilateral lower extremities are negative for deep vein thrombosis. There is no evidence of Baker's cyst bilaterally.  01/26/2015  [name], RVT, RDCS, RDMS  

## 2015-01-27 LAB — CBC
HEMATOCRIT: 36.4 % — AB (ref 39.0–52.0)
Hemoglobin: 12.4 g/dL — ABNORMAL LOW (ref 13.0–17.0)
MCH: 27.9 pg (ref 26.0–34.0)
MCHC: 34.1 g/dL (ref 30.0–36.0)
MCV: 82 fL (ref 78.0–100.0)
Platelets: 197 10*3/uL (ref 150–400)
RBC: 4.44 MIL/uL (ref 4.22–5.81)
RDW: 13 % (ref 11.5–15.5)
WBC: 7.1 10*3/uL (ref 4.0–10.5)

## 2015-01-27 LAB — GLUCOSE, CAPILLARY
GLUCOSE-CAPILLARY: 117 mg/dL — AB (ref 65–99)
GLUCOSE-CAPILLARY: 117 mg/dL — AB (ref 65–99)
GLUCOSE-CAPILLARY: 121 mg/dL — AB (ref 65–99)
GLUCOSE-CAPILLARY: 195 mg/dL — AB (ref 65–99)
Glucose-Capillary: 236 mg/dL — ABNORMAL HIGH (ref 65–99)
Glucose-Capillary: 195 mg/dL — ABNORMAL HIGH (ref 65–99)
Glucose-Capillary: 196 mg/dL — ABNORMAL HIGH (ref 65–99)

## 2015-01-27 LAB — HEPARIN LEVEL (UNFRACTIONATED): Heparin Unfractionated: 0.42 IU/mL (ref 0.30–0.70)

## 2015-01-27 MED ORDER — FUROSEMIDE 20 MG PO TABS
20.0000 mg | ORAL_TABLET | Freq: Every day | ORAL | Status: DC
  Administered 2015-01-27: 20 mg via ORAL
  Filled 2015-01-27: qty 1

## 2015-01-27 NOTE — Progress Notes (Addendum)
ANTICOAGULATION CONSULT NOTE   Pharmacy Consult for heparin Indication: Multi-vessel, CAD, awaiting CVTS consult  Allergies  Allergen Reactions  . Codeine Other (See Comments)    intolerance    Patient Measurements: Height: 6\' 1"  (185.4 cm) Weight: 224 lb 3.3 oz (101.7 kg) IBW/kg (Calculated) : 79.9 Heparin Dosing Weight: 100 kg  Vital Signs: Temp: 98.3 F (36.8 C) (01/01 0700) Temp Source: Oral (01/01 0700) BP: 139/81 mmHg (01/01 0700) Pulse Rate: 85 (01/01 0700)  Labs:  Recent Labs  01/25/15 0405  01/26/15 0315 01/26/15 1115 01/26/15 2012 01/27/15 0158  HGB 12.6*  --  12.5*  --   --  12.4*  HCT 37.2*  --  36.7*  --   --  36.4*  PLT 204  --  202  --   --  197  LABPROT 14.4  --   --   --   --   --   INR 1.10  --   --   --   --   --   HEPARINUNFRC 0.20*  < >  --  0.24* 0.44 0.42  CREATININE 1.21  --   --  1.10  --   --   TROPONINI  --   --  5.30*  --   --   --   < > = values in this interval not displayed.  Estimated Creatinine Clearance: 82.8 mL/min (by C-G formula based on Cr of 1.1).  Assessment: 65 y/o on heparin for NSTEMI. Cath showed multivessel CAD. CVTS has declared he is not adequate for CABG at this point. Plan to optimize HF therapies and reassess later.   HL continues to be therapeutic at 0.4 this morning. Hgb stable at 12.4, plts wnl. No s/s of bleeding.  Goal of Therapy:  Heparin level 0.3-0.7 units/ml Monitor platelets by anticoagulation protocol: Yes   Plan:  Continue heparin gtt at 1950 units/hr Monitor daily HL, CBC, s/s of bleed ?Start oral anticoagulation for apical thrombus  Sheppard CoilFrank Wilson PharmD., BCPS Clinical Pharmacist Pager [phone] 01/27/2015 8:43 AM

## 2015-01-27 NOTE — Progress Notes (Addendum)
Patient Name: Bradley Benitez Date of Encounter: 01/27/2015  Principal Problem:   Acute systolic congestive heart failure, NYHA class 4 (HCC) Active Problems:   Acute respiratory failure with hypoxemia (HCC)   Hypertensive heart disease with congestive heart failure (HCC)   Poorly controlled type 2 diabetes mellitus with peripheral neuropathy (HCC)   Diabetic nephropathy associated with diabetes mellitus due to underlying condition (HCC)   NSTEMI (non-ST elevated myocardial infarction) (HCC)   Mural thrombus of cardiac apex (HCC)   Dyslipidemia   Length of Stay: 4  SUBJECTIVE  No events overnight. diuresed -300, total of 5.1L. Plan for evaluation of viability and optimization of CHF prior to possible CABG.  Denies CP oe SOB.   CURRENT MEDS . antiseptic oral rinse  7 mL Mouth Rinse BID  . aspirin  324 mg Oral Daily   Or  . aspirin  300 mg Rectal Daily  . atorvastatin  80 mg Oral q1800  . budesonide-formoterol  2 puff Inhalation BID  . insulin aspart  0-15 Units Subcutaneous 6 times per day  . insulin glargine  15 Units Subcutaneous Daily  . metoprolol tartrate  25 mg Oral BID  . sodium chloride  3 mL Intravenous Q12H  . sodium chloride  3 mL Intravenous Q12H   . heparin 1,950 Units/hr (01/27/15 0218)  . nitroGLYCERIN 5 mcg/min (01/26/15 1900)   OBJECTIVE  Filed Vitals:   01/27/15 0403 01/27/15 0500 01/27/15 0600 01/27/15 0700  BP:  125/81 118/73 139/81  Pulse:  76 70 85  Temp: 98.2 F (36.8 C)   98.3 F (36.8 C)  TempSrc: Oral   Oral  Resp:  22 19 25   Height:      Weight:  224 lb 3.3 oz (101.7 kg)    SpO2:  98% 100% 98%    Intake/Output Summary (Last 24 hours) at 01/27/15 0921 Last data filed at 01/27/15 0600  Gross per 24 hour  Intake 970.64 ml  Output   1250 ml  Net -279.36 ml   Filed Weights   01/25/15 0600 01/26/15 0326 01/27/15 0500  Weight: 226 lb 3.2 oz (102.604 kg) 226 lb 3.1 oz (102.6 kg) 224 lb 3.3 oz (101.7 kg)    PHYSICAL  EXAM  Physical Exam: General appearance: alert and no distress Lungs: rales RLL Heart: regular rate and rhythm, S1, S2 normal, no murmur, click, rub or gallop Abdomen: soft, non-tender; bowel sounds normal; no masses,  no organomegaly Extremities: edema 1+ pitting edema in his feet, R>L Pulses: 2+ and symmetric Neurologic: Mental status: Alert, oriented, thought content appropriate   Accessory Clinical Findings  CBC  Recent Labs  01/25/15 0405 01/26/15 0315 01/27/15 0158  WBC 8.5 7.1 7.1  NEUTROABS 6.0  --   --   HGB 12.6* 12.5* 12.4*  HCT 37.2* 36.7* 36.4*  MCV 83.2 83.0 82.0  PLT 204 202 197   Basic Metabolic Panel  Recent Labs  01/25/15 0405 01/26/15 1115  NA 143 141  K 3.7 3.6  CL 107 107  CO2 24 24  GLUCOSE 135* 209*  BUN 15 15  CREATININE 1.21 1.10  CALCIUM 8.5* 9.0    Recent Labs  01/26/15 0315  TROPONINI 5.30*   Radiology/Studies  Dg Chest 2 View  01/26/2015  CLINICAL DATA:  Shortness of breath this AM; h/o diabetes; former smoker EXAM: CHEST  2 VIEW COMPARISON:  01/25/2015 FINDINGS: Heart size is normal. There is prominence of interstitial markings, increased. Perihilar peribronchial thickening and  noted. There are no focal consolidations. There are small bilateral pleural effusions. IMPRESSION: 1. Increased bronchitic changes. 2. Increased bilateral pleural effusions. Electronically Signed   By: Norva PavlovElizabeth  Brown M.D.   On: 01/26/2015 10:34   Dg Chest Port 1 View  01/25/2015  CLINICAL DATA:  Acute respiratory failure with hypoxemia.  Fever. EXAM: PORTABLE CHEST 1 VIEW COMPARISON:  01/23/2015. FINDINGS: Grossly stable enlarged cardiac silhouette. Prominent pulmonary vasculature and interstitial markings with improvement. Interval small amount of linear density in the right mid lung zone. Minimal right pleural fluid. Central peribronchial thickening. Unremarkable bones. IMPRESSION: 1. Improving changes of congestive heart failure. 2. Small amount of  linear atelectasis in the right mid lung zone. 3. Moderate bronchitic changes. Electronically Signed   By: Beckie SaltsSteven  Reid M.D.   On: 01/25/2015 16:34   Dg Chest Port 1 View  01/23/2015  CLINICAL DATA:  Congestive heart failure EXAM: PORTABLE CHEST 1 VIEW COMPARISON:  None. FINDINGS: There is generalized interstitial edema with patchy alveolar edema in the bases. Heart is enlarged with pulmonary venous hypertension. No adenopathy. IMPRESSION: Evidence of congestive heart failure with edema most pronounced in the lung bases. Electronically Signed   By: Bretta BangWilliam  Woodruff III M.D.   On: 01/23/2015 09:34     ASSESSMENT AND PLAN  1. CAD, NSTEMI - LHC yesterday showed Severe 3 vessel CAD, suspect LAD is culprit for NSTEMI, but severe multivessel disease is present, known severe LV dysfunction, after reviewing his films, it was determined that he has surgical anatomy (3 vessel disease, severe LV dysfunction with heart failure, and diabetes), but not sure that he's a good candidate based on multiple comorbidities. Evaluated by Dr Morton PetersVan Tright yesterday who would operate if there is viability, and improvement of LVEF, we will schedule a cardiac MRI. Consult CHF services. - continue ASA, atorvastatin, metoprolol  The patient seems to have multiple psychiatric problems, often appears psychotic with delusional thought that might be complicating his post op course. I would have a psychiatric evaluation prior to the planned surgery. (Talking about UFOs, killing someone, feeling his organs and being able to treat them with the power of his hands).   2. Acute systolic congestive heart failure, NYHA class 4 (HCC) - improving, negative 5 L this hospitalization, improved Crea, I will add lasix 20 mg po daily and monitor Crea. The patient is refusing lasix as he believes that it makes him feel jittery.   3. Hypertensive heart disease with congestive heart failure (HCC) - controlled  4. ARI - improving, crea now 1.1  5.  Ischemic cardiomyopathy - ACEI/ARB on hold for ARI  6.  Mural thrombus of cardiac apex (HCC) on iv Heparin  7. Dyslipidemia   Signed, Lars MassonNELSON, Deldrick Linch H MD, [facility] HospitalFACC 01/27/2015

## 2015-01-28 ENCOUNTER — Inpatient Hospital Stay (HOSPITAL_COMMUNITY): Payer: PPO

## 2015-01-28 DIAGNOSIS — E1165 Type 2 diabetes mellitus with hyperglycemia: Secondary | ICD-10-CM

## 2015-01-28 DIAGNOSIS — E1142 Type 2 diabetes mellitus with diabetic polyneuropathy: Secondary | ICD-10-CM

## 2015-01-28 LAB — CULTURE, BLOOD (ROUTINE X 2)
Culture: NO GROWTH
Culture: NO GROWTH

## 2015-01-28 LAB — BASIC METABOLIC PANEL
ANION GAP: 13 (ref 5–15)
BUN: 15 mg/dL (ref 6–20)
CALCIUM: 8.8 mg/dL — AB (ref 8.9–10.3)
CO2: 22 mmol/L (ref 22–32)
Chloride: 107 mmol/L (ref 101–111)
Creatinine, Ser: 0.97 mg/dL (ref 0.61–1.24)
GFR calc Af Amer: >60 mL/min (ref >60)
GLUCOSE: 118 mg/dL — AB (ref 65–99)
POTASSIUM: 3.4 mmol/L — AB (ref 3.5–5.1)
Sodium: 142 mmol/L (ref 135–145)

## 2015-01-28 LAB — CBC
HCT: 35.1 % — ABNORMAL LOW (ref 39.0–52.0)
HEMOGLOBIN: 12.1 g/dL — AB (ref 13.0–17.0)
MCH: 29.2 pg (ref 26.0–34.0)
MCHC: 34.5 g/dL (ref 30.0–36.0)
MCV: 84.8 fL (ref 78.0–100.0)
PLATELETS: 196 10*3/uL (ref 150–400)
RBC: 4.14 MIL/uL — ABNORMAL LOW (ref 4.22–5.81)
RDW: 13.4 % (ref 11.5–15.5)
WBC: 7.9 10*3/uL (ref 4.0–10.5)

## 2015-01-28 LAB — GLUCOSE, CAPILLARY
GLUCOSE-CAPILLARY: 107 mg/dL — AB (ref 65–99)
GLUCOSE-CAPILLARY: 163 mg/dL — AB (ref 65–99)
GLUCOSE-CAPILLARY: 170 mg/dL — AB (ref 65–99)
GLUCOSE-CAPILLARY: 173 mg/dL — AB (ref 65–99)
Glucose-Capillary: 89 mg/dL (ref 65–99)
Glucose-Capillary: 167 mg/dL — ABNORMAL HIGH (ref 65–99)

## 2015-01-28 LAB — HEPARIN LEVEL (UNFRACTIONATED): HEPARIN UNFRACTIONATED: 0.46 [IU]/mL (ref 0.30–0.70)

## 2015-01-28 MED ORDER — GABAPENTIN 600 MG PO TABS
300.0000 mg | ORAL_TABLET | Freq: Two times a day (BID) | ORAL | Status: DC
  Administered 2015-01-28 – 2015-01-31 (×8): 300 mg via ORAL
  Filled 2015-01-28 (×8): qty 1

## 2015-01-28 MED ORDER — HEPARIN BOLUS VIA INFUSION
2000.0000 [IU] | Freq: Once | INTRAVENOUS | Status: AC
  Administered 2015-01-28: 2000 [IU] via INTRAVENOUS
  Filled 2015-01-28: qty 2000

## 2015-01-28 MED ORDER — ALBUTEROL SULFATE (2.5 MG/3ML) 0.083% IN NEBU
2.5000 mg | INHALATION_SOLUTION | Freq: Once | RESPIRATORY_TRACT | Status: AC
  Administered 2015-01-29: 2.5 mg via RESPIRATORY_TRACT

## 2015-01-28 MED ORDER — PANTOPRAZOLE SODIUM 40 MG PO TBEC
40.0000 mg | DELAYED_RELEASE_TABLET | Freq: Every day | ORAL | Status: DC
  Administered 2015-01-28 – 2015-01-31 (×4): 40 mg via ORAL
  Filled 2015-01-28 (×4): qty 1

## 2015-01-28 MED ORDER — GADOBENATE DIMEGLUMINE 529 MG/ML IV SOLN
34.0000 mL | Freq: Once | INTRAVENOUS | Status: AC | PRN
  Administered 2015-01-28: 34 mL via INTRAVENOUS

## 2015-01-28 MED ORDER — CARVEDILOL 3.125 MG PO TABS
3.1250 mg | ORAL_TABLET | Freq: Two times a day (BID) | ORAL | Status: DC
  Administered 2015-01-28 – 2015-01-31 (×8): 3.125 mg via ORAL
  Filled 2015-01-28 (×9): qty 1

## 2015-01-28 MED ORDER — LISINOPRIL 5 MG PO TABS
5.0000 mg | ORAL_TABLET | Freq: Every day | ORAL | Status: DC
  Administered 2015-01-28 – 2015-01-31 (×4): 5 mg via ORAL
  Filled 2015-01-28 (×4): qty 1

## 2015-01-28 MED ORDER — POTASSIUM CHLORIDE CRYS ER 20 MEQ PO TBCR
40.0000 meq | EXTENDED_RELEASE_TABLET | Freq: Once | ORAL | Status: AC
  Administered 2015-01-28: 40 meq via ORAL
  Filled 2015-01-28: qty 2

## 2015-01-28 MED ORDER — FUROSEMIDE 10 MG/ML IJ SOLN
40.0000 mg | Freq: Three times a day (TID) | INTRAMUSCULAR | Status: DC
  Administered 2015-01-28 – 2015-01-29 (×4): 40 mg via INTRAVENOUS
  Filled 2015-01-28 (×4): qty 4

## 2015-01-28 MED ORDER — SALINE SPRAY 0.65 % NA SOLN
1.0000 | NASAL | Status: DC | PRN
  Filled 2015-01-28: qty 44

## 2015-01-28 NOTE — Progress Notes (Signed)
Respiratory Care Note Unable to do PFT study today.  In with patient and 10:55 am.   Pt c/o indigestion.  He would like to waite until after MRI to do the test.   The nurse was  Informed.  Called the nurse back post MRI procedure. Pt is too tired to do PFT. Will attempt to do study tomorrow.

## 2015-01-28 NOTE — Care Management Important Message (Signed)
Important Message  Patient Details  Name: Bradley PilaDavid M Benitez MRN: [id number] Date of Birth: 03/10/1950   Medicare Important Message Given:  Yes    Rayvon CharSTUTTS, Lania Zawistowski G 01/28/2015, 11:08 AMImportant Message  Patient Details  Name: Bradley PilaDavid M Bradley Benitez MRN: [id number] Date of Birth: 03/10/1950   Medicare Important Message Given:  Yes    Nakiea Metzner G 01/28/2015, 11:08 AM

## 2015-01-28 NOTE — Progress Notes (Signed)
Heparin drip turned off during MRI  -only about 1hr Will give small bolus 2000 utsx1 - d/t apical thrombus Restart heparin 1950 uts/hr Check AML  Leota SauersLisa Ursala Cressy Pharm.D. CPP, BCPS Clinical Pharmacist [phone] 01/28/2015 2:43 PM

## 2015-01-28 NOTE — Progress Notes (Signed)
Patient ID: Bradley Benitez, male   DOB: [birth year], 65 y.o.   MRN: [id number]   65 yo with history of DM and smoking but has not had medical care in decades presented with acute systolic CHF.  Echo showed EF 25-30% with apical thrombus and LHC showed 3VD.    He is on heparin gtt.  He was on IV Lasix but now is on low dose po Lasix.  Has not been out of bed.  Still feels short of breath at times and has occasional chest heaviness (?if this is related to meals).   LHC/RHC (12/16):  RA mean 11 PA 35/11 PCWP mean 23 CI 2.59   Ost RPDA lesion, 70% stenosed.  1st RPLB lesion, 80% stenosed.  Mid Cx lesion, 80% stenosed.  Prox LAD lesion, 60% stenosed.  Mid LAD lesion, 95% stenosed.  Ost 2nd Diag lesion, 70% stenosed.  Ost Ramus to Ramus lesion, 90% stenosed   Filed Vitals:   01/28/15 0214 01/28/15 0303 01/28/15 0304 01/28/15 0743  BP: 123/77 107/75    Pulse: 83 78  62  Temp:  97.6 F (36.4 C)  97.3 F (36.3 C)  TempSrc:  Oral  Oral  Resp: 18 20  15   Height:      Weight:   221 lb 5.5 oz (100.4 kg)   SpO2: 99% 91%  99%    Intake/Output Summary (Last 24 hours) at 01/28/15 0806 Last data filed at 01/28/15 0500  Gross per 24 hour  Intake 790.03 ml  Output    750 ml  Net  40.03 ml    LABS: Basic Metabolic Panel:  Recent Labs  40/98/1112/31/16 1115  NA 141  K 3.6  CL 107  CO2 24  GLUCOSE 209*  BUN 15  CREATININE 1.10  CALCIUM 9.0   Liver Function Tests:  Recent Labs  01/26/15 1115  AST 28  ALT 20  ALKPHOS 64  BILITOT 0.5  PROT 6.6  ALBUMIN 3.3*   No results for input(s): LIPASE, AMYLASE in the last 72 hours. CBC:  Recent Labs  01/27/15 0158 01/28/15 0249  WBC 7.1 7.9  HGB 12.4* 12.1*  HCT 36.4* 35.1*  MCV 82.0 84.8  PLT 197 196   Cardiac Enzymes:  Recent Labs  01/26/15 0315  TROPONINI 5.30*   BNP: Invalid input(s): POCBNP D-Dimer: No results for input(s): DDIMER in the last 72 hours. Hemoglobin A1C: No results for input(s): HGBA1C in the  last 72 hours. Fasting Lipid Panel: No results for input(s): CHOL, HDL, LDLCALC, TRIG, CHOLHDL, LDLDIRECT in the last 72 hours. Thyroid Function Tests: No results for input(s): TSH, T4TOTAL, T3FREE, THYROIDAB in the last 72 hours.  Invalid input(s): FREET3 Anemia Panel: No results for input(s): VITAMINB12, FOLATE, FERRITIN, TIBC, IRON, RETICCTPCT in the last 72 hours.  RADIOLOGY: Dg Chest 2 View  01/26/2015  CLINICAL DATA:  Shortness of breath this AM; h/o diabetes; former smoker EXAM: CHEST  2 VIEW COMPARISON:  01/25/2015 FINDINGS: Heart size is normal. There is prominence of interstitial markings, increased. Perihilar peribronchial thickening and noted. There are no focal consolidations. There are small bilateral pleural effusions. IMPRESSION: 1. Increased bronchitic changes. 2. Increased bilateral pleural effusions. Electronically Signed   By: Norva PavlovElizabeth  Brown M.D.   On: 01/26/2015 10:34   Dg Chest Port 1 View  01/25/2015  CLINICAL DATA:  Acute respiratory failure with hypoxemia.  Fever. EXAM: PORTABLE CHEST 1 VIEW COMPARISON:  01/23/2015. FINDINGS: Grossly stable enlarged cardiac silhouette. Prominent pulmonary vasculature and interstitial markings with improvement.  Interval small amount of linear density in the right mid lung zone. Minimal right pleural fluid. Central peribronchial thickening. Unremarkable bones. IMPRESSION: 1. Improving changes of congestive heart failure. 2. Small amount of linear atelectasis in the right mid lung zone. 3. Moderate bronchitic changes. Electronically Signed   By: Beckie Salts M.D.   On: 01/25/2015 16:34   Dg Chest Port 1 View  01/23/2015  CLINICAL DATA:  Congestive heart failure EXAM: PORTABLE CHEST 1 VIEW COMPARISON:  None. FINDINGS: There is generalized interstitial edema with patchy alveolar edema in the bases. Heart is enlarged with pulmonary venous hypertension. No adenopathy. IMPRESSION: Evidence of congestive heart failure with edema most pronounced  in the lung bases. Electronically Signed   By: Bretta Bang III M.D.   On: 01/23/2015 09:34    PHYSICAL EXAM General: NAD Neck: JVP 10 cm, no thyromegaly or thyroid nodule.  Lungs: Decreased breath sounds at bases bilaterally.  CV: Nondisplaced PMI.  Heart regular S1/S2, no S3/S4, no murmur.  1+ edema 1/2 to knees bilaterally.  No carotid bruit.  Normal pedal pulses.  Abdomen: Soft, nontender, no hepatosplenomegaly, no distention.  Neurologic: Alert and oriented x 3.  Psych: Normal affect. Extremities: No clubbing or cyanosis. Lesions on toes noted.   TELEMETRY: Reviewed telemetry pt in NSR  ASSESSMENT AND PLAN: 65 yo with history of DM and smoking but has not had medical care in decades presented with acute systolic CHF.  Echo showed EF 25-30% with apical thrombus and LHC showed 3VD.  1. Acute systolic CHF: EF 16-10% with regional WMAs, ischemic cardiomyopathy.  He remains volume overloaded on exam.  He had RHC done earlier this admission which showed preserved cardiac output (CI 2.59).  - Lasix 40 mg IV every 8 hrs - Replace K. - Needs BMET stat today.  - Add lisinopril 5 mg daily.  - Stop metoprolol, start Coreg 3.125 mg bid. - Plan for cardiac MRI today to assess for viability => if there is considerable viable tissue, CABG would be his best option.  2. CAD: Diffuse 3 vessel disease, see cath report above.  Would be best served by CABG if there is viable tissue.  Has occasional mild chest heaviness, seems more related to meals than anything else.  - Add Protonix 40 mg daily . - Continue ASA, atorvastatin 80 daily, and heparin gtt.  - As above, plan for MRI to assess for viability prior to possible CABG.  3. LV mural thrombus: Continue heparin gtt.  4. Diabetes: Currently getting insulin.  5. Foot lesions: Will get wound consult and will get peripheral arterial dopplers.  6. Diabetic neuropathy: Symptomatic neuropathy.  Will add gabapentin 300 mg bid.   35 minutes critical  care time.   [name] 01/28/2015 8:17 AM

## 2015-01-28 NOTE — Progress Notes (Signed)
PULMONARY / CRITICAL CARE MEDICINE   Name: Bradley Benitez MRN: [id number] DOB: Nov 17, 1950    ADMISSION DATE:  01/23/2015 CONSULTATION DATE:  01/23/2015  REFERRING MD:  Bradley Benitez  CHIEF COMPLAINT:  Dyspnea  Brief description:  Bradley Benitez was admitted on 12/28 with a CHF exacerbation after not going to the doctor for years.  He smoked 3 ppd for 20 years, quit 20 years ago, has 2 brothers who died of CHF.   SUBJECTIVE:  Diuresing  Oxygenation improving Going to cath lab now  VITAL SIGNS: BP 111/56 mmHg  Pulse 93  Temp(Src) 97.5 F (36.4 C) (Oral)  Resp 18  Ht 6\' 1"  (1.854 m)  Wt 221 lb 5.5 oz (100.4 kg)  BMI 29.21 kg/m2  SpO2 100%  HEMODYNAMICS:    VENTILATOR SETTINGS:    INTAKE / OUTPUT: I/O last 3 completed shifts: In: 1488 [P.O.:630; I.V.:858] Out: 1550 [Urine:1550]  PHYSICAL EXAMINATION: General:  No distress Neuro:  Awake alert, oriented 4, No gross focal deficits.  HEENT:  Moist mucus membranes,  Cardiovascular:  Regular rate and rhythm Lungs:  Clear, no MRG Abdomen:  Bowel sounds positive, soft, nontender Musculoskeletal:  Normal bulk and tone Skin:  Trace edema  LABS:  BMET  Recent Labs Lab 01/25/15 0405 01/26/15 1115 01/28/15 0836  NA 143 141 142  K 3.7 3.6 3.4*  CL 107 107 107  CO2 24 24 22   BUN 15 15 15   CREATININE 1.21 1.10 0.97  GLUCOSE 135* 209* 118*    Electrolytes  Recent Labs Lab 01/24/15 0027 01/25/15 0405 01/26/15 1115 01/28/15 0836  CALCIUM 8.5* 8.5* 9.0 8.8*  MG 1.7  --   --   --   PHOS 3.9  --   --   --     CBC  Recent Labs Lab 01/26/15 0315 01/27/15 0158 01/28/15 0249  WBC 7.1 7.1 7.9  HGB 12.5* 12.4* 12.1*  HCT 36.7* 36.4* 35.1*  PLT 202 197 196    Coag's  Recent Labs Lab 01/25/15 0405  INR 1.10    Sepsis Markers  Recent Labs Lab 01/23/15 0919 01/23/15 1410  LATICACIDVEN 5.34* 3.1*    ABG  Recent Labs Lab 01/23/15 0905 01/25/15 0959 01/26/15 0634  PHART 7.348* 7.415 7.414   PCO2ART 35.9 37.4 38.7  PO2ART 304.0* 77.0* 87.0    Liver Enzymes  Recent Labs Lab 01/23/15 0842 01/26/15 1115  AST 21 28  ALT 18 20  ALKPHOS 72 64  BILITOT 0.8 0.5  ALBUMIN 3.9 3.3*    Cardiac Enzymes  Recent Labs Lab 01/23/15 1917 01/24/15 0027 01/26/15 0315  TROPONINI 28.64* 26.20* 5.30*    Glucose  Recent Labs Lab 01/27/15 1623 01/27/15 1951 01/27/15 2352 01/28/15 0402 01/28/15 0745 01/28/15 1137  GLUCAP 196* 195* 163* 107* 89 167*    Imaging No results found.   STUDIES:  01/23/2015 echocardiogram > LV 20-25%, anterior wall motion abnormality with clot.  LHC/RHC (12/16): RA mean 11 PA 35/11 PCWP mean 23 CI 2.59   Ost RPDA lesion, 70% stenosed.  1st RPLB lesion, 80% stenosed.  Mid Cx lesion, 80% stenosed.  Prox LAD lesion, 60% stenosed.  Mid LAD lesion, 95% stenosed.  Ost 2nd Diag lesion, 70% stenosed.  Ost Ramus to Ramus lesion, 90% stenosed  CULTURES: 01/23/2015 blood culture > 01/23/2015 urine culture  >  ANTIBIOTICS: 01/23/2015 vancomycin 1 01/23/2015 Zosyn 1  SIGNIFICANT EVENTS: 01/23/2015 admission to [facility], BiPAP, cardiology consult  LINES/TUBES: 01/23/2015 BiPAP   DISCUSSION: 65 y/o male admitted  12/28 with acute systolic CHF in the setting of likely severe coronary artery disease.  Improved respiratory effort after diuresis last night, suspect he has at least LAD disease based on EKG.  Troponin elevation shows signs of myocardial infarct.  Oxygenation improving.  ASSESSMENT / PLAN:  PULMONARY A: Acute hypoxemic respiratory failure secondary to acute pulmonary edema > improving P:   On nasal cannula 3 lt. We can probably wean off O2 but he prefers to keep it on for comfort.  Monitor O2 saturations, keep above 90%  CARDIOVASCULAR A:  Acute systolic congestive heart failure Coronary artery disease Age indeterminate myocardial infarct > troponin climbing overnight Hypertension, improved,  diastolic still elevated Sinus tachycardia > improved with metoprolol P:  Aspirin, lisinopril, coreg. Continue heparin gtt for now Telemetry monitoring Due for cardiac MRI. Being considered for CABG.  RENAL A:   Presumed chronic kidney disease, uncertain baseline P:   Monitor BMET and UOP Replace electrolytes as needed  GASTROINTESTINAL A:   No acute issues P:   PO feeds  HEMATOLOGIC A:   No acute issues  P:  Monitor for bleeding  INFECTIOUS A:   No evidence of infection P:   Monitor for fever  ENDOCRINE A:   Hyperglycemia, poorly controlled   P:   SSI Continue glargine.   NEUROLOGIC A:   Neuropathic pain secondary to poorly controlled diabetes P:   Consider wound care consult for foot ulcerations  FAMILY  - Updates: Family updated at bedside 1/2 PCCM will sign off. Please call back as needed.  Bradley GreathousePraveen Katrice Goel MD [facility] Pager [phone] If no answer or after 3pm call: [phone] 01/28/2015, 12:57 PM

## 2015-01-28 NOTE — Consult Note (Addendum)
WOC wound consult note Reason for Consult: Consult requested for bilat feet.  Pt is very particular about what he will allow to be applied and was reluctant to allow assessment of locations. There is dirty tape and bandaids in place. Wound type: Right great toe and 2nd toe have loose toenails which are about to fall off.  They are dark brown and crumbling and loosly attached.  Pt states this occurred several weeks ago and he tapes them in place until they eventually fall off.  There is no odor, drainage, or open wounds at this time.  Reapplied bandaids and paper tape as patient requested. Left plantar inner foot with dry callous; 2.5X2.5cm, no open wound, drainage, or fluctuance when probed.  Area is raised slightly above skin level.  In the middle plantar foot, there is a plantar's wart which has been there several years.  Pt has been applying vinegar and duct tape to this site.  There is no odor, drainage, or fluctuance, he states he trimmed the plantar wart so it is not visible at this time.   Dressing procedure/placement/frequency: Pt re-applied bandaids and paper tape and prefers to leave these dressings in place, this is appropriate since there are no open wounds requiring topical treatment at this time.   Please re-consult if further assistance is needed.  Thank-you,  Cammie Mcgeeawn Jais Demir MSN, RN, CWOCN, [place]CN-AP, CNS [phone]

## 2015-01-28 NOTE — Progress Notes (Signed)
ANTICOAGULATION CONSULT NOTE   Pharmacy Consult for heparin Indication: Multi-vessel, CAD, awaiting CVTS consult  Allergies  Allergen Reactions  . Codeine Other (See Comments)    intolerance    Patient Measurements: Height: 6\' 1"  (185.4 cm) Weight: 221 lb 5.5 oz (100.4 kg) IBW/kg (Calculated) : 79.9 Heparin Dosing Weight: 100 kg  Vital Signs: Temp: 97.6 F (36.4 C) (01/02 0303) Temp Source: Oral (01/02 0303) BP: 107/75 mmHg (01/02 0303) Pulse Rate: 78 (01/02 0303)  Labs:  Recent Labs  01/26/15 0315 01/26/15 1115 01/26/15 2012 01/27/15 0158 01/28/15 0249  HGB 12.5*  --   --  12.4* 12.1*  HCT 36.7*  --   --  36.4* 35.1*  PLT 202  --   --  197 196  HEPARINUNFRC  --  0.24* 0.44 0.42 0.46  CREATININE  --  1.10  --   --   --   TROPONINI 5.30*  --   --   --   --     Estimated Creatinine Clearance: 82.3 mL/min (by C-G formula based on Cr of 1.1).  Assessment: 65 y/o on heparin for NSTEMI with 3 vessel CAD awaiting possible CABG and new apical thrombus. Plan to optimize HF therapies and reassess for CABG later.   HL continues to be therapeutic at 0.46 this morning. CBC wnl and stable. No s/s of bleeding.  Goal of Therapy:  Heparin level 0.3-0.7 units/ml Monitor platelets by anticoagulation protocol: Yes   Plan:  Continue heparin gtt at 1950 units/hr Monitor daily HL, CBC, s/s of bleed F/u start oral anticoagulation for apical thrombus  Aniella Wandrey K. Bonnye FavaNicolsen, PharmD, BCPS, CPP Clinical Pharmacist Pager: [phone] Phone: [phone] 01/28/2015 8:01 AM

## 2015-01-29 ENCOUNTER — Inpatient Hospital Stay (HOSPITAL_COMMUNITY): Payer: PPO

## 2015-01-29 ENCOUNTER — Other Ambulatory Visit: Payer: Self-pay

## 2015-01-29 LAB — SPIROMETRY WITH GRAPH
FEF 25-75 Post: 2.87 L/sec
FEF 25-75 Pre: 2.66 L/sec
FEF2575-%Change-Post: 7 %
FEF2575-%Pred-Post: 104 %
FEF2575-%Pred-Pre: 96 %
FEV1-%Change-Post: 6 %
FEV1-%Pred-Post: 86 %
FEV1-%Pred-Pre: 80 %
FEV1-Post: 3.03 L
FEV1-Pre: 2.84 L
FEV1FVC-%Change-Post: -6 %
FEV1FVC-%Pred-Pre: 109 %
FEV6-%Change-Post: 11 %
FEV6-%Pred-Post: 87 %
FEV6-%Pred-Pre: 78 %
FEV6-Post: 3.91 L
FEV6-Pre: 3.52 L
FEV6FVC-%Change-Post: -2 %
FEV6FVC-%Pred-Post: 102 %
FEV6FVC-%Pred-Pre: 105 %
FVC-%Change-Post: 14 %
FVC-%Pred-Post: 85 %
FVC-%Pred-Pre: 74 %
FVC-Post: 4.01 L
FVC-Pre: 3.52 L
Post FEV1/FVC ratio: 75 %
Post FEV6/FVC ratio: 97 %
Pre FEV1/FVC ratio: 81 %
Pre FEV6/FVC Ratio: 100 %

## 2015-01-29 LAB — HEPARIN LEVEL (UNFRACTIONATED)
HEPARIN UNFRACTIONATED: 1.1 [IU]/mL — AB (ref 0.30–0.70)
Heparin Unfractionated: 0.76 IU/mL — ABNORMAL HIGH (ref 0.30–0.70)
Heparin Unfractionated: 1.16 IU/mL — ABNORMAL HIGH (ref 0.30–0.70)

## 2015-01-29 LAB — GLUCOSE, CAPILLARY
GLUCOSE-CAPILLARY: 175 mg/dL — AB (ref 65–99)
GLUCOSE-CAPILLARY: 186 mg/dL — AB (ref 65–99)
Glucose-Capillary: 136 mg/dL — ABNORMAL HIGH (ref 65–99)
Glucose-Capillary: 97 mg/dL (ref 65–99)
Glucose-Capillary: 268 mg/dL — ABNORMAL HIGH (ref 65–99)

## 2015-01-29 LAB — BASIC METABOLIC PANEL
ANION GAP: 9 (ref 5–15)
BUN: 18 mg/dL (ref 6–20)
CALCIUM: 8.5 mg/dL — AB (ref 8.9–10.3)
CO2: 26 mmol/L (ref 22–32)
CREATININE: 1.18 mg/dL (ref 0.61–1.24)
Chloride: 106 mmol/L (ref 101–111)
Glucose, Bld: 204 mg/dL — ABNORMAL HIGH (ref 65–99)
Potassium: 3.2 mmol/L — ABNORMAL LOW (ref 3.5–5.1)
SODIUM: 141 mmol/L (ref 135–145)

## 2015-01-29 LAB — CBC
HCT: 31.8 % — ABNORMAL LOW (ref 39.0–52.0)
HEMOGLOBIN: 10.9 g/dL — AB (ref 13.0–17.0)
MCH: 27.9 pg (ref 26.0–34.0)
MCHC: 34.3 g/dL (ref 30.0–36.0)
MCV: 81.3 fL (ref 78.0–100.0)
PLATELETS: 174 10*3/uL (ref 150–400)
RBC: 3.91 MIL/uL — AB (ref 4.22–5.81)
RDW: 13.1 % (ref 11.5–15.5)
WBC: 7.1 10*3/uL (ref 4.0–10.5)

## 2015-01-29 MED ORDER — FUROSEMIDE 40 MG PO TABS
40.0000 mg | ORAL_TABLET | Freq: Two times a day (BID) | ORAL | Status: DC
  Administered 2015-01-29: 40 mg via ORAL
  Filled 2015-01-29: qty 1

## 2015-01-29 MED ORDER — WARFARIN VIDEO: Freq: Once | Status: DC

## 2015-01-29 MED ORDER — ASPIRIN EC 81 MG PO TBEC
81.0000 mg | DELAYED_RELEASE_TABLET | Freq: Every day | ORAL | Status: DC
  Administered 2015-01-30 – 2015-01-31 (×2): 81 mg via ORAL
  Filled 2015-01-29 (×2): qty 1

## 2015-01-29 MED ORDER — WARFARIN SODIUM 7.5 MG PO TABS: 7.5000 mg | ORAL_TABLET | Freq: Once | ORAL | Status: DC

## 2015-01-29 MED ORDER — COUMADIN BOOK
Freq: Once | Status: DC
  Filled 2015-01-29: qty 1

## 2015-01-29 MED ORDER — SODIUM CHLORIDE 0.9 % IJ SOLN
10.0000 mL | Freq: Two times a day (BID) | INTRAMUSCULAR | Status: DC
  Administered 2015-01-30 – 2015-01-31 (×5): 10 mL

## 2015-01-29 MED ORDER — SODIUM CHLORIDE 0.9 % IJ SOLN: 10.0000 mL | INTRAMUSCULAR | Status: DC | PRN

## 2015-01-29 MED ORDER — POTASSIUM CHLORIDE CRYS ER 20 MEQ PO TBCR
40.0000 meq | EXTENDED_RELEASE_TABLET | ORAL | Status: AC
  Administered 2015-01-29 (×2): 40 meq via ORAL
  Filled 2015-01-29 (×2): qty 2

## 2015-01-29 MED ORDER — MAGNESIUM SULFATE 2 GM/50ML IV SOLN
2.0000 g | Freq: Once | INTRAVENOUS | Status: AC
  Administered 2015-01-29: 2 g via INTRAVENOUS
  Filled 2015-01-29: qty 50

## 2015-01-29 MED ORDER — SPIRONOLACTONE 25 MG PO TABS
12.5000 mg | ORAL_TABLET | Freq: Every day | ORAL | Status: DC
  Administered 2015-01-29: 12.5 mg via ORAL
  Filled 2015-01-29: qty 1

## 2015-01-29 MED ORDER — POTASSIUM CHLORIDE CRYS ER 20 MEQ PO TBCR: 40.0000 meq | EXTENDED_RELEASE_TABLET | Freq: Two times a day (BID) | ORAL | Status: DC

## 2015-01-29 MED ORDER — ISOSORBIDE MONONITRATE ER 30 MG PO TB24
30.0000 mg | ORAL_TABLET | Freq: Every day | ORAL | Status: DC
  Administered 2015-01-29 – 2015-01-31 (×3): 30 mg via ORAL
  Filled 2015-01-29 (×3): qty 1

## 2015-01-29 MED ORDER — HEPARIN (PORCINE) IN NACL 100-0.45 UNIT/ML-% IJ SOLN
1300.0000 [IU]/h | INTRAMUSCULAR | Status: DC
  Administered 2015-01-30: 1500 [IU]/h via INTRAVENOUS
  Filled 2015-01-29: qty 250

## 2015-01-29 MED ORDER — HEPARIN (PORCINE) IN NACL 100-0.45 UNIT/ML-% IJ SOLN
1750.0000 [IU]/h | INTRAMUSCULAR | Status: DC
  Administered 2015-01-29 (×2): 1750 [IU]/h via INTRAVENOUS
  Filled 2015-01-29: qty 250

## 2015-01-29 MED ORDER — WARFARIN - PHARMACIST DOSING INPATIENT: Freq: Every day | Status: DC

## 2015-01-29 MED FILL — Heparin Sodium (Porcine) 2 Unit/ML in Sodium Chloride 0.9%: INTRAMUSCULAR | Qty: 500 | Status: AC

## 2015-01-29 MED FILL — Lidocaine HCl Local Preservative Free (PF) Inj 1%: INTRAMUSCULAR | Qty: 30 | Status: AC

## 2015-01-29 NOTE — Consult Note (Signed)
   [facility]   01/29/2015  Bradley PilaDavid M Benitez Sep 18, 1950 [id number] Patient evaluated for community based chronic disease management services with [facility] as a benefit of patient's [employer] Medicare Insurance. Spoke with patient at bedside, at length,  to explain [facility] services. Patient has not been to see a physician he states in about "40 years".  Patient is listed with [facility] Pearson GrippeJames Kim [facility].  Admitted with Acute HF with Hypercapnia. Consent form signed. Patient requesting to know more about his in network benefits.  Encouraged him to call customer service on his card for specific questions. Patient verbalized understanding.   Patient will receive post hospital discharge call and will be evaluated for monthly home visits for assessments and disease process education.  Left contact information and [facility] literature at bedside. Made Inpatient Case Manager aware that [facility] following. Of note, [facility] services does not replace or interfere with any services that are arranged by inpatient case management or social work.  For additional questions or referrals please contact:   Charlesetta ShanksVictoria Gillie Crisci, RN BSN CCM [facility] Liaison  [phone] business mobile phone

## 2015-01-29 NOTE — Progress Notes (Signed)
ANTICOAGULATION CONSULT NOTE   Pharmacy Consult for heparin Indication: Multi-vessel CAD, awaiting CVTS consult  Allergies  Allergen Reactions  . Codeine Other (See Comments)    intolerance    Patient Measurements: Height: 6\' 1"  (185.4 cm) Weight: 225 lb 3.2 oz (102.15 kg) IBW/kg (Calculated) : 79.9 Heparin Dosing Weight: 100 kg  Vital Signs: Temp: 97.6 F (36.4 C) (01/03 0332) Temp Source: Oral (01/03 0332) BP: 109/91 mmHg (01/03 0332) Pulse Rate: 67 (01/03 0332)  Labs:  Recent Labs  01/26/15 1115  01/27/15 0158 01/28/15 0249 01/28/15 0836 01/29/15 0304  HGB  --   < > 12.4* 12.1*  --  10.9*  HCT  --   --  36.4* 35.1*  --  31.8*  PLT  --   --  197 196  --  174  HEPARINUNFRC 0.24*  < > 0.42 0.46  --  1.10*  CREATININE 1.10  --   --   --  0.97  --   < > = values in this interval not displayed.  Estimated Creatinine Clearance: 94.1 mL/min (by C-G formula based on Cr of 0.97).  Assessment: 65 y/o on heparin for NSTEMI with 3 vessel CAD awaiting possible CABG and new apical thrombus. Plan to optimize HF therapies and reassess for CABG later.    HL now supratherapeutic (1.1) on 1950 units/hr. RN confirmed that level was drawn from arm opposite where heparin was running so level should be accurate. Unusual as pt was therapeutic x 2 days on this rate. Hgb down to 10.9, plt down to 174. No bleeding noted.  Goal of Therapy:  Heparin level 0.3-0.7 units/ml Monitor platelets by anticoagulation protocol: Yes   Plan:  Hold heparin x 1 hour Decrease heparin gtt to 1750 units/hr F/u 6 hour heparin level F/u start oral anticoagulation for apical thrombus  Christoper Fabianaron Ameris Akamine, PharmD, BCPS Clinical pharmacist, pager [phone] 01/29/2015 4:12 AM

## 2015-01-29 NOTE — Progress Notes (Signed)
IV team called  claiming that there is a delay in inserting th PICC line today, PA made  aware.

## 2015-01-29 NOTE — Progress Notes (Signed)
ANTICOAGULATION CONSULT NOTE - Follow Up Consult  Pharmacy Consult for heparin Indication: Apical thrombus  Allergies  Allergen Reactions  . Codeine Other (See Comments)    intolerance    Patient Measurements: Height: 6\' 1"  (185.4 cm) Weight: 225 lb 3.2 oz (102.15 kg) IBW/kg (Calculated) : 79.9 Heparin Dosing Weight: 100 kg  Vital Signs: Temp: 98.2 F (36.8 C) (01/03 1620) Temp Source: Oral (01/03 1620) BP: 112/71 mmHg (01/03 1620) Pulse Rate: 88 (01/03 1620)  Labs:  Recent Labs  01/27/15 0158 01/28/15 0249 01/28/15 0836 01/29/15 0304 01/29/15 1055 01/29/15 1856  HGB 12.4* 12.1*  --  10.9*  --   --   HCT 36.4* 35.1*  --  31.8*  --   --   PLT 197 196  --  174  --   --   HEPARINUNFRC 0.42 0.46  --  1.10* 0.76* 1.16*  CREATININE  --   --  0.97 1.18  --   --     Estimated Creatinine Clearance: 77.3 mL/min (by C-G formula based on Cr of 1.18).   Assessment: 64yom continues on heparin for multivessel CAD and new apical thrombus. Heparin level remains slightly elevated despite holding x 1 hour and decreasing rate earlier today. Cardiac MRI did not show significant viability in the LAD territory and therefore the patient will not proceed with CABG. Cards would like to hold off on starting coumadin for now. Coumadin score = 6. Baseline INR 1.1. Last HL was elevated at 1.16. No issues per RN.  Goal of Therapy:  Heparin level 0.3-0.7 units/ml Monitor platelets by anticoagulation protocol: Yes   Plan:  Decrease heparin gtt to 1,500 units/hr Check 6 hr HL Monitor daily HL, CBC, s/s of bleed  Enzo BiNathan Hester Forget, PharmD, [facility] LP[facility]PS Clinical Pharmacist Pager [phone] 01/29/2015 8:37 PM

## 2015-01-29 NOTE — Progress Notes (Addendum)
Patient ID: Bradley Benitez, male   DOB: [birth year], 65 y.o.   MRN: [id number]   65 yo with history of DM and smoking but has not had medical care in decades presented with acute systolic CHF.  Echo showed EF 25-30% with apical thrombus and LHC showed 3VD.    He remains on heparin and NTG gtt.  Yesterday started lisinopril and carvedilol started. Weight up 4 pounds but he is negative 1.3 liters. d   Today he is complaining of dizziness. Denies SOB but has ongoing chest heaviness.    LHC/RHC (12/16):  RA mean 11 PA 35/11 PCWP mean 23 CI 2.59   Ost RPDA lesion, 70% stenosed.  1st RPLB lesion, 80% stenosed.  Mid Cx lesion, 80% stenosed.  Prox LAD lesion, 60% stenosed.  Mid LAD lesion, 95% stenosed.  Ost 2nd Diag lesion, 70% stenosed.  Ost Ramus to Ramus lesion, 90% stenosed  Cardiac MRI (1/17): 1. Severe LV systolic dysfunction, EF 26%. Wall motion abnormalities as above. 2. Normal RV size and systolic function . 3. LGE pattern suggestive of infarction in LAD territory. The mid anterior/anteroseptal and apical anterior/septal/inferior wall segments and the true apex do not appear viable (probably not likely to improve function with revascularization). The remainder of the left ventricle appears viable. 4. There is a small LV apical thrombus.  Scheduled Meds: . albuterol  2.5 mg Nebulization Once  . antiseptic oral rinse  7 mL Mouth Rinse BID  . aspirin  324 mg Oral Daily   Or  . aspirin  300 mg Rectal Daily  . atorvastatin  80 mg Oral q1800  . budesonide-formoterol  2 puff Inhalation BID  . carvedilol  3.125 mg Oral BID WC  . furosemide  40 mg Oral BID  . gabapentin  300 mg Oral BID  . insulin aspart  0-15 Units Subcutaneous 6 times per day  . insulin glargine  15 Units Subcutaneous Daily  . lisinopril  5 mg Oral Daily  . pantoprazole  40 mg Oral Daily  . potassium chloride  40 mEq Oral Q4H  . sodium chloride  3 mL Intravenous Q12H  . sodium chloride  3 mL  Intravenous Q12H  . spironolactone  12.5 mg Oral Daily   Continuous Infusions: . heparin 1,750 Units/hr (01/29/15 0529)  . nitroGLYCERIN 5 mcg/min (01/28/15 1900)   PRN Meds:.sodium chloride, sodium chloride, sodium chloride, acetaminophen, hydrALAZINE, iohexol, ondansetron (ZOFRAN) IV, sodium chloride, sodium chloride, sodium chloride, traZODone    Filed Vitals:   01/28/15 2045 01/29/15 0034 01/29/15 0035 01/29/15 0332  BP:   105/63 109/91  Pulse:   69 67  Temp: 97.6 F (36.4 C) 97.7 F (36.5 C)  97.6 F (36.4 C)  TempSrc: Oral Oral  Oral  Resp:    20  Height:      Weight:    225 lb 3.2 oz (102.15 kg)  SpO2:   96% 96%    Intake/Output Summary (Last 24 hours) at 01/29/15 0715 Last data filed at 01/29/15 0600  Gross per 24 hour  Intake 880.29 ml  Output   2250 ml  Net -1369.71 ml    LABS: Basic Metabolic Panel:  Recent Labs  21/30/8599/03/14 0836 01/29/15 0304  NA 142 141  K 3.4* 3.2*  CL 107 106  CO2 22 26  GLUCOSE 118* 204*  BUN 15 18  CREATININE 0.97 1.18  CALCIUM 8.8* 8.5*   Liver Function Tests:  Recent Labs  01/26/15 1115  AST 28  ALT 20  ALKPHOS 64  BILITOT 0.5  PROT 6.6  ALBUMIN 3.3*   No results for input(s): LIPASE, AMYLASE in the last 72 hours. CBC:  Recent Labs  01/28/15 0249 01/29/15 0304  WBC 7.9 7.1  HGB 12.1* 10.9*  HCT 35.1* 31.8*  MCV 84.8 81.3  PLT 196 174   Cardiac Enzymes: No results for input(s): CKTOTAL, CKMB, CKMBINDEX, TROPONINI in the last 72 hours. BNP: Invalid input(s): POCBNP D-Dimer: No results for input(s): DDIMER in the last 72 hours. Hemoglobin A1C: No results for input(s): HGBA1C in the last 72 hours. Fasting Lipid Panel: No results for input(s): CHOL, HDL, LDLCALC, TRIG, CHOLHDL, LDLDIRECT in the last 72 hours. Thyroid Function Tests: No results for input(s): TSH, T4TOTAL, T3FREE, THYROIDAB in the last 72 hours.  Invalid input(s): FREET3 Anemia Panel: No results for input(s): VITAMINB12, FOLATE,  FERRITIN, TIBC, IRON, RETICCTPCT in the last 72 hours.  RADIOLOGY: Dg Chest 2 View  01/26/2015  CLINICAL DATA:  Shortness of breath this AM; h/o diabetes; former smoker EXAM: CHEST  2 VIEW COMPARISON:  01/25/2015 FINDINGS: Heart size is normal. There is prominence of interstitial markings, increased. Perihilar peribronchial thickening and noted. There are no focal consolidations. There are small bilateral pleural effusions. IMPRESSION: 1. Increased bronchitic changes. 2. Increased bilateral pleural effusions. Electronically Signed   By: Norva Pavlov M.D.   On: 01/26/2015 10:34   Dg Chest Port 1 View  01/25/2015  CLINICAL DATA:  Acute respiratory failure with hypoxemia.  Fever. EXAM: PORTABLE CHEST 1 VIEW COMPARISON:  01/23/2015. FINDINGS: Grossly stable enlarged cardiac silhouette. Prominent pulmonary vasculature and interstitial markings with improvement. Interval small amount of linear density in the right mid lung zone. Minimal right pleural fluid. Central peribronchial thickening. Unremarkable bones. IMPRESSION: 1. Improving changes of congestive heart failure. 2. Small amount of linear atelectasis in the right mid lung zone. 3. Moderate bronchitic changes. Electronically Signed   By: Beckie Salts M.D.   On: 01/25/2015 16:34   Dg Chest Port 1 View  01/23/2015  CLINICAL DATA:  Congestive heart failure EXAM: PORTABLE CHEST 1 VIEW COMPARISON:  None. FINDINGS: There is generalized interstitial edema with patchy alveolar edema in the bases. Heart is enlarged with pulmonary venous hypertension. No adenopathy. IMPRESSION: Evidence of congestive heart failure with edema most pronounced in the lung bases. Electronically Signed   By: Bretta Bang III M.D.   On: 01/23/2015 09:34    PHYSICAL EXAM General: NAD Neck: JVP 7-8 cm, no thyromegaly or thyroid nodule.  Lungs: Decreased breath sounds at bases bilaterally.  CV: Nondisplaced PMI.  Heart regular S1/S2, no S3/S4, no murmur.  R and LLE trace  edema. No carotid bruit.  Normal pedal pulses.  Abdomen: Soft, nontender, no hepatosplenomegaly, no distention.  Neurologic: Alert and oriented x 3.  Psych: Normal affect. Extremities: No clubbing or cyanosis. Lesions on toes noted.   TELEMETRY: Reviewed telemetry pt in NSR  ASSESSMENT AND PLAN: 65 yo with history of DM and smoking but has not had medical care in decades presented with acute systolic CHF.  Echo showed EF 25-30% with apical thrombus and LHC showed 3VD.  1. Acute systolic CHF: EF 16-10% with regional WMAs, ischemic cardiomyopathy.  He remains volume overloaded on exam.  He had RHC done earlier this admission which showed preserved cardiac output (CI 2.59).  -Volume status improved despite weight gain. Stop IV lasix and switch to lasix 40 mg twice a day. Add 12.5 mg spiro - Continue lisinopril 5 mg daily.  - Continue Coreg  3.125 mg bid. - Supplement potassium.  - Dr Shirlee Latch to review CMRI today for  viability => if there is considerable viable tissue, CABG would be his best option.  - Add ted hose 2. CAD: Diffuse 3 vessel disease, see cath report above.  Would be best served by CABG if there is viable tissue.  Has occasional mild chest heaviness - Continue protonix 40 mg daily . - Continue ASA, atorvastatin 80 daily and heparin gtt.  - Can stop NTG gtt and start Imdur. - As above, plan for MRI to assess for viability prior to possible CABG.  3. LV mural thrombus: Continue heparin gtt.  4. Diabetes: Currently getting insulin.  5. Foot lesions: WOC consult appreciated. Continue dry dressing. Peripheral arterial dopplers pending.   6. Diabetic neuropathy: Symptomatic neuropathy.  Will add gabapentin 300 mg bid.    Amy Clegg NP-C 01/29/2015 7:15 AM   Patient seen with NP, agree with the above note.  Stable.  Volume status better, agree with transition to po Lasix.  Add spironolactone with cardiomyopathy.   MRI reviewed: LAD territory does not appear to have significant  viability.  Remainder of LV myocardium does appear viable.  Will need to discuss with Dr Donata Clay => He has severe disease in LCx and RCA territory that may benefit from revascularization but suspect LAD territory will not recover.  If no CABG, could potentially do multivessel PCI but question his long-term compliance with antiplatelet regimen.   Still has LV thrombus.  On heparin gtt, eventual transition to warfarin.  Transition to Imdur off NTG gtt.  Ambulate with cardiac rehab.   [name] 01/29/2015 10:01 AM

## 2015-01-29 NOTE — Care Management Note (Addendum)
Case Management Note  Patient Details  Name: Bradley PilaDavid M Benitez MRN: [id number] Date of Birth: 14-Sep-1950  Subjective/Objective:      Pt adm w chf,mi              Action/Plan: lives at home   Expected Discharge Date:                  Expected Discharge Plan:     In-House Referral:     Discharge planning Services  CM Consult, Follow-up appt scheduled  Post Acute Care Choice:    Choice offered to:     DME Arranged:    DME Agency:     HH Arranged:    HH Agency:     Status of Service:     Medicare Important Message Given:  Yes Date Medicare IM Given:    Medicare IM give by:    Date Additional Medicare IM Given:    Additional Medicare Important Message give by:     If discussed at Long Length of Stay Meetings, dates discussed:    Additional Comments: pt chose dr Pearson Grippejames kim  [phone] when he signed up for healthteam adv. Have called to dr  Kim's office to sched appt. nse came by and pt lives alone, home condtions poor per nse. sw for snf-not doing well living alone. Bradley Benitez, Bradley Dorsainvil T, RN 01/29/2015, 10:52 AM

## 2015-01-29 NOTE — Progress Notes (Signed)
Inpatient Diabetes Program Recommendations  AACE/ADA: New Consensus Statement on Inpatient Glycemic Control (2015)  Target Ranges:  Prepandial:   less than 140 mg/dL      Peak postprandial:   less than 180 mg/dL (1-2 hours)      Critically ill patients:  140 - 180 mg/dL   Review of Glycemic Control  Inpatient Diabetes Program Recommendations:  Consult received for poorly controlled DM.  Currently glucose is within target range.   Consider changing Novolog to TID + HS since patient is now eating.  Thank you  Bradley ClimesGina Waynesha Benitez BSN, RN,CDE Inpatient Diabetes Coordinator [phone] (team pager)

## 2015-01-29 NOTE — Progress Notes (Addendum)
ANTICOAGULATION CONSULT NOTE - Follow Up Consult  Pharmacy Consult for Heparin Indication: Apical thrombus  Allergies  Allergen Reactions  . Codeine Other (See Comments)    intolerance    Patient Measurements: Height: 6\' 1"  (185.4 cm) Weight: 225 lb 3.2 oz (102.15 kg) IBW/kg (Calculated) : 79.9 Heparin Dosing Weight: 100kg  Vital Signs: Temp: 97.9 F (36.6 C) (01/03 1123) Temp Source: Oral (01/03 1123) BP: 113/98 mmHg (01/03 0839) Pulse Rate: 97 (01/03 1123)  Labs:  Recent Labs  01/27/15 0158 01/28/15 0249 01/28/15 0836 01/29/15 0304 01/29/15 1055  HGB 12.4* 12.1*  --  10.9*  --   HCT 36.4* 35.1*  --  31.8*  --   PLT 197 196  --  174  --   HEPARINUNFRC 0.42 0.46  --  1.10* 0.76*  CREATININE  --   --  0.97 1.18  --     Estimated Creatinine Clearance: 77.3 mL/min (by C-G formula based on Cr of 1.18).   Medications:  Heparin @ 1300 units/hr  Assessment: 64yom continues on heparin for multivessel CAD and new apical thrombus. Heparin level is slightly above goal despite rate decrease this morning. Coumadin started yesterday but then plans changed and patient will now proceed with CABG on Friday so coumadin subsequently d/c'ed (never received dose). CBC stable. No bleeding.   Goal of Therapy:  Heparin level 0.3-0.7 units/ml Monitor platelets by anticoagulation protocol: Yes   Plan:  1) Decrease heparin to 1200 units/hr 2) Check another 6 hour heparin level  [name], Asya Derryberry Sue 01/29/2015,12:11 PM

## 2015-01-29 NOTE — Progress Notes (Signed)
Peripherally Inserted Central Catheter/Midline Placement  The IV Nurse has discussed with the patient and/or persons authorized to consent for the patient, the purpose of this procedure and the potential benefits and risks involved with this procedure.  The benefits include less needle sticks, lab draws from the catheter and patient may be discharged home with the catheter.  Risks include, but not limited to, infection, bleeding, blood clot (thrombus formation), and puncture of an artery; nerve damage and irregular heat beat.  Alternatives to this procedure were also discussed.  PICC/Midline Placement Documentation        Yang Rack, Lajean ManesKerry Loraine 01/29/2015, 10:26 PM

## 2015-01-30 LAB — GLUCOSE, CAPILLARY
GLUCOSE-CAPILLARY: 128 mg/dL — AB (ref 65–99)
GLUCOSE-CAPILLARY: 120 mg/dL — AB (ref 65–99)
GLUCOSE-CAPILLARY: 132 mg/dL — AB (ref 65–99)
Glucose-Capillary: 165 mg/dL — ABNORMAL HIGH (ref 65–99)
Glucose-Capillary: 120 mg/dL — ABNORMAL HIGH (ref 65–99)

## 2015-01-30 LAB — BASIC METABOLIC PANEL
Anion gap: 7 (ref 5–15)
BUN: 18 mg/dL (ref 6–20)
CALCIUM: 8.5 mg/dL — AB (ref 8.9–10.3)
CHLORIDE: 109 mmol/L (ref 101–111)
CO2: 26 mmol/L (ref 22–32)
CREATININE: 1.29 mg/dL — AB (ref 0.61–1.24)
GFR calc non Af Amer: 56 mL/min — ABNORMAL LOW (ref >60)
Glucose, Bld: 184 mg/dL — ABNORMAL HIGH (ref 65–99)
Potassium: 3.6 mmol/L (ref 3.5–5.1)
SODIUM: 142 mmol/L (ref 135–145)

## 2015-01-30 LAB — PROTIME-INR
INR: 1.15 (ref 0.00–1.49)
PROTHROMBIN TIME: 14.9 s (ref 11.6–15.2)

## 2015-01-30 LAB — CBC
HCT: 31.7 % — ABNORMAL LOW (ref 39.0–52.0)
Hemoglobin: 10.8 g/dL — ABNORMAL LOW (ref 13.0–17.0)
MCH: 27.8 pg (ref 26.0–34.0)
MCHC: 34.1 g/dL (ref 30.0–36.0)
MCV: 81.7 fL (ref 78.0–100.0)
PLATELETS: 176 10*3/uL (ref 150–400)
RBC: 3.88 MIL/uL — AB (ref 4.22–5.81)
RDW: 13.1 % (ref 11.5–15.5)
WBC: 6.1 10*3/uL (ref 4.0–10.5)

## 2015-01-30 LAB — CARBOXYHEMOGLOBIN
Carboxyhemoglobin: 1.5 % (ref 0.5–1.5)
Methemoglobin: 0.8 % (ref 0.0–1.5)
O2 SAT: 64 %
Total hemoglobin: 11.4 g/dL — ABNORMAL LOW (ref 13.5–18.0)

## 2015-01-30 LAB — HEPARIN LEVEL (UNFRACTIONATED)
HEPARIN UNFRACTIONATED: 0.94 [IU]/mL — AB (ref 0.30–0.70)
HEPARIN UNFRACTIONATED: 0.72 [IU]/mL — AB (ref 0.30–0.70)
HEPARIN UNFRACTIONATED: 0.63 [IU]/mL (ref 0.30–0.70)

## 2015-01-30 MED ORDER — POTASSIUM CHLORIDE CRYS ER 20 MEQ PO TBCR
40.0000 meq | EXTENDED_RELEASE_TABLET | Freq: Once | ORAL | Status: AC
  Administered 2015-01-30: 40 meq via ORAL
  Filled 2015-01-30: qty 2

## 2015-01-30 MED ORDER — ENSURE ENLIVE PO LIQD
237.0000 mL | Freq: Two times a day (BID) | ORAL | Status: DC
  Administered 2015-01-30 – 2015-01-31 (×3): 237 mL via ORAL

## 2015-01-30 MED ORDER — HEPARIN (PORCINE) IN NACL 100-0.45 UNIT/ML-% IJ SOLN
1200.0000 [IU]/h | INTRAMUSCULAR | Status: DC
  Administered 2015-01-31: 1200 [IU]/h via INTRAVENOUS
  Filled 2015-01-30 (×2): qty 250

## 2015-01-30 MED ORDER — FUROSEMIDE 10 MG/ML IJ SOLN
40.0000 mg | Freq: Three times a day (TID) | INTRAMUSCULAR | Status: DC
  Administered 2015-01-30 – 2015-01-31 (×4): 40 mg via INTRAVENOUS
  Filled 2015-01-30 (×4): qty 4

## 2015-01-30 MED ORDER — SPIRONOLACTONE 25 MG PO TABS
25.0000 mg | ORAL_TABLET | Freq: Every day | ORAL | Status: DC
  Administered 2015-01-30 – 2015-01-31 (×2): 25 mg via ORAL
  Filled 2015-01-30 (×2): qty 1

## 2015-01-30 NOTE — Progress Notes (Signed)
Patient ID: Bradley Benitez, male   DOB: Mar 22, 1950, 65 y.o.   MRN: [id number]   65 yo with history of DM and smoking but has not had medical care in decades presented with acute systolic CHF.  Echo showed EF 25-30% with apical thrombus and LHC showed 3VD.    He remains on heparin gtt with LV thrombus.  PICC placed yesterday, CVP 12-13 and co-ox 64%. Weighed in bed, ?accuracy.   Has not been out of bed.  Reports orthopnea, feels like "lungs have water in them."   LHC/RHC (12/16):  RA mean 11 PA 35/11 PCWP mean 23 CI 2.59   Ost RPDA lesion, 70% stenosed.  1st RPLB lesion, 80% stenosed.  Mid Cx lesion, 80% stenosed.  Prox LAD lesion, 60% stenosed.  Mid LAD lesion, 95% stenosed.  Ost 2nd Diag lesion, 70% stenosed.  Ost Ramus to Ramus lesion, 90% stenosed  Cardiac MRI (1/17): 1. Severe LV systolic dysfunction, EF 26%. Wall motion abnormalities as above. 2. Normal RV size and systolic function . 3. LGE pattern suggestive of infarction in LAD territory. The mid anterior/anteroseptal and apical anterior/septal/inferior wall segments and the true apex do not appear viable (probably not likely to improve function with revascularization). The remainder of the left ventricle appears viable. 4. There is a small LV apical thrombus.  Scheduled Meds: . antiseptic oral rinse  7 mL Mouth Rinse BID  . aspirin EC  81 mg Oral Daily  . atorvastatin  80 mg Oral q1800  . budesonide-formoterol  2 puff Inhalation BID  . carvedilol  3.125 mg Oral BID WC  . coumadin book   Does not apply Once  . furosemide  40 mg Intravenous Q8H  . gabapentin  300 mg Oral BID  . insulin aspart  0-15 Units Subcutaneous 6 times per day  . insulin glargine  15 Units Subcutaneous Daily  . isosorbide mononitrate  30 mg Oral Daily  . lisinopril  5 mg Oral Daily  . pantoprazole  40 mg Oral Daily  . potassium chloride  40 mEq Oral Once  . sodium chloride  10-40 mL Intracatheter Q12H  . sodium chloride  3 mL  Intravenous Q12H  . sodium chloride  3 mL Intravenous Q12H  . spironolactone  25 mg Oral Daily  . warfarin   Does not apply Once   Continuous Infusions: . heparin 1,300 Units/hr (01/30/15 0410)   PRN Meds:.sodium chloride, sodium chloride, sodium chloride, acetaminophen, hydrALAZINE, iohexol, ondansetron (ZOFRAN) IV, sodium chloride, sodium chloride, sodium chloride, sodium chloride, traZODone    Filed Vitals:   01/30/15 0000 01/30/15 0300 01/30/15 0325 01/30/15 0500  BP:   103/89   Pulse: 81     Temp:    97.9 F (36.6 C)  TempSrc:    Oral  Resp:      Height:      Weight:  229 lb 0.9 oz (103.9 kg)    SpO2: 98%       Intake/Output Summary (Last 24 hours) at 01/30/15 0744 Last data filed at 01/30/15 0700  Gross per 24 hour  Intake 1252.33 ml  Output   1250 ml  Net   2.33 ml    LABS: Basic Metabolic Panel:  Recent Labs  40/98/11 0304 01/30/15 0310  NA 141 142  K 3.2* 3.6  CL 106 109  CO2 26 26  GLUCOSE 204* 184*  BUN 18 18  CREATININE 1.18 1.29*  CALCIUM 8.5* 8.5*   Liver Function Tests: No results for input(s): AST,  ALT, ALKPHOS, BILITOT, PROT, ALBUMIN in the last 72 hours. No results for input(s): LIPASE, AMYLASE in the last 72 hours. CBC:  Recent Labs  01/29/15 0304 01/30/15 0310  WBC 7.1 6.1  HGB 10.9* 10.8*  HCT 31.8* 31.7*  MCV 81.3 81.7  PLT 174 176   Cardiac Enzymes: No results for input(s): CKTOTAL, CKMB, CKMBINDEX, TROPONINI in the last 72 hours. BNP: Invalid input(s): POCBNP D-Dimer: No results for input(s): DDIMER in the last 72 hours. Hemoglobin A1C: No results for input(s): HGBA1C in the last 72 hours. Fasting Lipid Panel: No results for input(s): CHOL, HDL, LDLCALC, TRIG, CHOLHDL, LDLDIRECT in the last 72 hours. Thyroid Function Tests: No results for input(s): TSH, T4TOTAL, T3FREE, THYROIDAB in the last 72 hours.  Invalid input(s): FREET3 Anemia Panel: No results for input(s): VITAMINB12, FOLATE, FERRITIN, TIBC, IRON,  RETICCTPCT in the last 72 hours.  RADIOLOGY: Dg Chest 2 View  01/26/2015  CLINICAL DATA:  Shortness of breath this AM; h/o diabetes; former smoker EXAM: CHEST  2 VIEW COMPARISON:  01/25/2015 FINDINGS: Heart size is normal. There is prominence of interstitial markings, increased. Perihilar peribronchial thickening and noted. There are no focal consolidations. There are small bilateral pleural effusions. IMPRESSION: 1. Increased bronchitic changes. 2. Increased bilateral pleural effusions. Electronically Signed   By: Norva PavlovElizabeth  Brown M.D.   On: 01/26/2015 10:34   Dg Chest Port 1 View  01/25/2015  CLINICAL DATA:  Acute respiratory failure with hypoxemia.  Fever. EXAM: PORTABLE CHEST 1 VIEW COMPARISON:  01/23/2015. FINDINGS: Grossly stable enlarged cardiac silhouette. Prominent pulmonary vasculature and interstitial markings with improvement. Interval small amount of linear density in the right mid lung zone. Minimal right pleural fluid. Central peribronchial thickening. Unremarkable bones. IMPRESSION: 1. Improving changes of congestive heart failure. 2. Small amount of linear atelectasis in the right mid lung zone. 3. Moderate bronchitic changes. Electronically Signed   By: Beckie SaltsSteven  Reid M.D.   On: 01/25/2015 16:34   Dg Chest Port 1 View  01/23/2015  CLINICAL DATA:  Congestive heart failure EXAM: PORTABLE CHEST 1 VIEW COMPARISON:  None. FINDINGS: There is generalized interstitial edema with patchy alveolar edema in the bases. Heart is enlarged with pulmonary venous hypertension. No adenopathy. IMPRESSION: Evidence of congestive heart failure with edema most pronounced in the lung bases. Electronically Signed   By: Bretta BangWilliam  Woodruff III M.D.   On: 01/23/2015 09:34   Mr Card Morphology Wo/w Cm  01/29/2015  CLINICAL DATA:  CAD, assess for viability EXAM: CARDIAC MRI TECHNIQUE: The patient was scanned on a 1.5 Tesla GE magnet. A dedicated cardiac coil was used. Functional imaging was done using Fiesta  sequences. 2,3, and 4 chamber views were done to assess for RWMA's. Modified Simpson's rule using a short axis stack was used to calculate an ejection fraction on a dedicated work Research officer, trade unionstation using Circle software. The patient received 34 cc of Multihance. After 10 minutes inversion recovery sequences were used to assess for infiltration and scar tissue. CONTRAST:  34 cc Multihance FINDINGS: There were bilateral small to moderate pleural effusions. There was a trivial pericardial effusion. Normal left ventricular size. EF 26% with mid anteroseptal and mid anterior akinesis. The mid anterolateral wall was hypokinetic. The apical septal, inferior, and anterior segments were akinetic and the apical lateral segment was hypokinetic. The true apex was akinetic. A small apical thrombus was noted. The atria were normal in size. The right ventricle was normal in size and systolic function. The aortic valve was trileaflet with no stenosis or significant  regurgitation. There did not appear to be significant mitral regurgitation. On delayed enhancement imaging, there was subendocardial 76-99% wall thickness late gadolinium enhancement (LGE) in the mid anteroseptal and mid anterior wall segments, in the apical septal/anterior/inferior wall segments, and in the true apex. Basal segments without LGE, anterolateral wall without significant LGE, and mid inferior wall without LGE. MEASUREMENTS: MEASUREMENTS LV EDV 212 mL LV SV 54 mL LV EF 26% IMPRESSION: 1. Severe LV systolic dysfunction, EF 26%. Wall motion abnormalities as above. 2.  Normal RV size and systolic function . 3. LGE pattern suggestive of infarction in LAD territory. The mid anterior/anteroseptal and apical anterior/septal/inferior wall segments and the true apex do not appear viable (probably not likely to improve function with revascularization). The remainder of the left ventricle appears viable. 4.  There is a small LV apical thrombus. Khyron Garno Electronically  Signed   By: [name] M.D.   On: 01/29/2015 09:10    PHYSICAL EXAM General: NAD Neck: JVP 9-10 cm, no thyromegaly or thyroid nodule.  Lungs: Decreased breath sounds at bases bilaterally.  CV: Nondisplaced PMI.  Heart regular S1/S2, no S3/S4, no murmur.  R and LLE trace edema. No carotid bruit.  Normal pedal pulses.  Abdomen: Soft, nontender, no hepatosplenomegaly, no distention.  Neurologic: Alert and oriented x 3.  Psych: Normal affect. Extremities: No clubbing or cyanosis. Lesions on toes noted.   TELEMETRY: Reviewed telemetry pt in NSR  ASSESSMENT AND PLAN: 65 yo with history of DM and smoking but has not had medical care in decades presented with acute systolic CHF.  Echo showed EF 25-30% with apical thrombus and LHC showed 3VD.  1. Acute systolic CHF: EF 16-10% with regional WMAs, ischemic cardiomyopathy.  He remains volume overloaded on exam.  He had RHC done earlier this admission which showed preserved cardiac output (CI 2.59).  Co-ox good at 64% but CVP remains elevated, 12-13. - Resume IV Lasix today.  - Continue current Coreg and lisinopril. - Increase spironolactone to 25 mg daily.  2. CAD: Diffuse 3 vessel disease, see cath report above. MRI reviewed: LAD territory does not appear to have significant viability.  Remainder of LV myocardium does appear viable.  He has severe disease in LCx and RCA territory that may benefit from revascularization but suspect LAD territory will not recover.  Discussed with Dr Donata Clay, tentative plan for CABG Friday.  - Continue ASA, atorvastatin 80 daily and heparin gtt.  - Continue Imdur. - Hopefully CABG on Friday.   3. LV mural thrombus: Continue heparin gtt.  Will need warfarin long-term.  4. Diabetes: Currently getting insulin.  Will consult diabetes coordinator. Will check hemoglobin A1c.  5. Foot lesions: WOC consult appreciated. Continue dry dressing. Peripheral arterial dopplers still pending.   6. Diabetic neuropathy:  Symptomatic neuropathy.  Added gabapentin 300 mg bid.  7. Out of bed.   [name] 01/30/2015 7:44 AM

## 2015-01-30 NOTE — Evaluation (Signed)
Occupational Therapy Evaluation Patient Details Name: Bradley PilaDavid M Chirco MRN: [id number] DOB: Sep 01, 1950 Today's Date: 01/30/2015    History of Present Illness 65 y/o male with diabetes but currently does not go to the doctor or take any medications [facility]/2015 complaining of shortness of breath. In the emergency department he was noted to be profoundly hypoxemic, requiring BiPAP for respiratory assistance, he had an elevated lactic acid. He was given antibiotics for presumed sepsis.Tentative plan for CABG 02/01/2015   Clinical Impression   This 65 yo male admitted with above presents to acute OT with decreased balance, decreased mobility, generalized weakness, pt's perceived DOE, and decreased safety awareness all affecting his ability to care for himself at home by himself. He will benefit from acute OT with follow up OT at SNF.    Follow Up Recommendations  SNF          Precautions / Restrictions Precautions Precautions: Fall Restrictions Weight Bearing Restrictions: No      Mobility Bed Mobility Overal bed mobility: Needs Assistance Bed Mobility: Rolling;Sidelying to Sit Rolling: Supervision Sidelying to sit: Supervision       General bed mobility comments: VCs for technique to not use his hands/arms due to possible CABG 2 days from now (Friday)  Transfers Overall transfer level: Needs assistance   Transfers: Sit to/from Stand Sit to Stand: Min assist         General transfer comment: VCs for technique to not use his arms to help push up pending possible CABG 2 days from now (Friday)    Balance Overall balance assessment: Needs assistance Sitting-balance support: Feet supported;No upper extremity supported Sitting balance-Leahy Scale: Good     Standing balance support: Bilateral upper extremity supported Standing balance-Leahy Scale: Poor Standing balance comment: Reliiant on RW (without it or HHA he is unsteady on  his feet)                            ADL Overall ADL's : Needs assistance/impaired Eating/Feeding: Independent;Sitting   Grooming: Set up;Sitting   Upper Body Bathing: Set up;Sitting   Lower Body Bathing: Minimal assistance;Sit to/from stand   Upper Body Dressing : Set up;Sitting   Lower Body Dressing: Minimal assistance;Sit to/from stand   Toilet Transfer: Minimal assistance;Ambulation (out door and back into room to recliner)   Toileting- Clothing Manipulation and Hygiene: Minimal assistance;Sit to/from stand                         Pertinent Vitals/Pain Pain Assessment:  (heaviness in chest with some movements)     Hand Dominance Right   Extremity/Trunk Assessment Upper Extremity Assessment Upper Extremity Assessment: Overall WFL for tasks assessed   Lower Extremity Assessment Lower Extremity Assessment: Defer to PT evaluation       Communication Communication Communication: No difficulties   Cognition Arousal/Alertness: Awake/alert Behavior During Therapy: Impulsive Overall Cognitive Status: Impaired/Different from baseline Area of Impairment: Safety/judgement         Safety/Judgement: Decreased awareness of safety (with the mulitple lines he has)                    Home Living Family/patient expects to be discharged to:: Skilled nursing facility Living Arrangements: Alone  Prior Functioning/Environment Level of Independence: Independent             OT Diagnosis: Generalized weakness   OT Problem List: Decreased strength;Impaired balance (sitting and/or standing);Pain;Decreased knowledge of use of DME or AE;Decreased knowledge of precautions   OT Treatment/Interventions: Self-care/ADL training;Patient/family education;Balance training;DME and/or AE instruction;Therapeutic activities;Energy conservation    OT Goals(Current goals can be found in the care plan section)  Acute Rehab OT Goals Patient Stated Goal: to be able to go home OT Goal Formulation: With patient Time For Goal Achievement: 02/13/15 Potential to Achieve Goals: Good  OT Frequency: Min 2X/week              End of Session Nurse Communication: Mobility status (in recliner with chair alarm)  Activity Tolerance:  (Pt reports he felt a little winded just going out door and back to recliner) Patient left: in chair;with call bell/phone within reach;with chair alarm set;with family/visitor present   Time: 1100-1125 OT Time Calculation (min): 25 min Charges:  OT General Charges $OT Visit: 1 Procedure OT Evaluation $OT Eval Moderate Complexity: 1 Procedure OT Treatments $Self Care/Home Management : 8-22 mins  Evette Georges [phone] 01/30/2015, 11:43 AM

## 2015-01-30 NOTE — Progress Notes (Signed)
ANTICOAGULATION CONSULT NOTE - Follow Up Consult  Pharmacy Consult for heparin Indication: Apical thrombus  Allergies  Allergen Reactions  . Codeine Other (See Comments)    intolerance    Patient Measurements: Height: 6\' 1"  (185.4 cm) Weight: 229 lb 0.9 oz (103.9 kg) IBW/kg (Calculated) : 79.9 Heparin Dosing Weight: 100 kg  Vital Signs: Temp: 97.7 F (36.5 C) (01/03 2330) Temp Source: Oral (01/03 2330) BP: 112/71 mmHg (01/03 1620) Pulse Rate: 81 (01/04 0000)  Labs:  Recent Labs  01/28/15 0249 01/28/15 0836 01/29/15 0304 01/29/15 1055 01/29/15 1856 01/30/15 0310  HGB 12.1*  --  10.9*  --   --  10.8*  HCT 35.1*  --  31.8*  --   --  31.7*  PLT 196  --  174  --   --  176  LABPROT  --   --   --   --   --  14.9  INR  --   --   --   --   --  1.15  HEPARINUNFRC 0.46  --  1.10* 0.76* 1.16* 0.94*  CREATININE  --  0.97 1.18  --   --   --     Estimated Creatinine Clearance: 78 mL/min (by C-G formula based on Cr of 1.18).   Assessment: 64yom continues on heparin for multivessel CAD and new apical thrombus. Heparin level remains slightly after decreasing rate earlier today. Cardiac MRI did not show significant viability in the LAD territory and therefore the patient will not proceed with CABG. Cards would like to hold off on starting coumadin for now. Coumadin score = 6. Baseline INR 1.1. Last HL was elevated at 1.16. No issues per RN. H/H low, Plt wnl   Goal of Therapy:  Heparin level 0.3-0.7 units/ml Monitor platelets by anticoagulation protocol: Yes   Plan:  Decrease heparin gtt to 1,200 units/hr Check 6 hr HL Monitor daily HL, CBC, s/s of bleed  Vinnie LevelBenjamin Cortland Crehan, PharmD., BCPS Clinical Pharmacist Pager [phone]

## 2015-01-30 NOTE — Progress Notes (Signed)
ANTICOAGULATION CONSULT NOTE - Follow Up Consult  Pharmacy Consult for heparin Indication: Apical thrombus  Allergies  Allergen Reactions  . Codeine Other (See Comments)    intolerance    Patient Measurements: Height: 6\' 1"  (185.4 cm) Weight: 228 lb 6.3 oz (103.6 kg) IBW/kg (Calculated) : 79.9 Heparin Dosing Weight: 100 kg  Vital Signs: Temp: 98.1 F (36.7 C) (01/04 1634) Temp Source: Oral (01/04 1634) BP: 137/97 mmHg (01/04 1634) Pulse Rate: 73 (01/04 1634)  Labs:  Recent Labs  01/28/15 0249 01/28/15 0836 01/29/15 0304  01/30/15 0310 01/30/15 1130 01/30/15 1830  HGB 12.1*  --  10.9*  --  10.8*  --   --   HCT 35.1*  --  31.8*  --  31.7*  --   --   PLT 196  --  174  --  176  --   --   LABPROT  --   --   --   --  14.9  --   --   INR  --   --   --   --  1.15  --   --   HEPARINUNFRC 0.46  --  1.10*  < > 0.94* 0.72* 0.63  CREATININE  --  0.97 1.18  --  1.29*  --   --   < > = values in this interval not displayed.  Estimated Creatinine Clearance: 71.2 mL/min (by C-G formula based on Cr of 1.29).   Assessment: 64yom continues on heparin for multivessel CAD and new apical thrombus. Heparin level remains slightly after decreasing rate earlier today. Cardiac MRI did not show significant viability in the LAD territory and therefore the patient will not proceed with CABG. Cards would like to hold off on starting coumadin for now. Coumadin score = 6. Baseline INR 1.1. Last HL was slightly elevated at 0.72 and rate decreased. Now HL is therapeutic at 0.63.  Goal of Therapy:  Heparin level 0.3-0.7 units/ml Monitor platelets by anticoagulation protocol: Yes   Plan:  Continue heparin gtt at 1,200 units/hr Check 6 hr HL Monitor daily HL, CBC, s/s of bleed  Enzo BiNathan Cheryll Keisler, PharmD, [facility] JunctionBCPS Clinical Pharmacist Pager [phone] 01/30/2015 7:36 PM

## 2015-01-30 NOTE — Progress Notes (Signed)
Came to do education with pt. Gave him [facility] booklet, guideline, and video to watch. No family present and PT came to eval therefore will discuss ed and ambulate tomorrow. [phone] Ethelda ChickKristan Vernel Donlan CES, ACSM 2:42 PM 01/30/2015

## 2015-01-30 NOTE — Clinical Social Work Note (Signed)
Clinical Social Work Assessment  Patient Details  Name: Bradley Benitez MRN: [id number] Date of Birth: 05-25-1950  Date of referral:  01/30/15               Reason for consult:  Facility Placement, Other (Comment Required) (concerns related to home- ?condemned)                Permission sought to share information with:  Family Supports Permission granted to share information::  Yes, Verbal Permission Granted  Name::      (sisterNena Jordan [phone])  Agency::     Relationship::     Contact Information:     Housing/Transportation Living arrangements for the past 2 months:  Single Family Home Source of Information:  Patient, Other (Comment Required) (sister- Edie) Patient Interpreter Needed:  None Criminal Activity/Legal Involvement Pertinent to Current Situation/Hospitalization:  No - Comment as needed Significant Relationships:  None Lives with:  Self Do you feel safe going back to the place where you live?  Yes Need for family participation in patient care:  Yes (Comment)  Care giving concerns:  CSW consulted due to concerns related to patient's home (?condemened)   Facilities manager / plan:  CSW met with patient and his sister to assess. Per patient, he lives alone and "keep to myself- only interacting with people once a week for over 20 years". He denies any need or interest in going to SNF or ALF- as well denies any concerns related to his home's condition- Stating there is a hole in the floor of his bathroom but denies that its "condemnable".    Employment status:  Retired Forensic scientist:  Medicare PT Recommendations:  Not assessed at this time Information / Referral to community resources:     Patient/Family's Response to care:  Spoke with patient's sister in the hallway while a visiting pastor met with him- she reports that her brother is a difficult individual to deal with and that there is no family or friend willing or able to help him at dc. CSW encouraged  her to continue to reiterate to him this so he can begin to think about his dc plans more realistically. CSW discussed the plans for PT eval to deternine level of care/disposition at dc.   Patient/Family's Understanding of and Emotional Response to Diagnosis, Current Treatment, and Prognosis:  Sister and patient are aware of his condition- patient tearfully shared that he had not taken care of himself and now is realizing and dealing with heart problems.   Emotional Assessment Appearance:  Appears older than stated age Attitude/Demeanor/Rapport:    Affect (typically observed):  Withdrawn Orientation:  Oriented to Self, Oriented to Place, Oriented to  Time, Oriented to Situation Alcohol / Substance use:  Not Applicable Psych involvement (Current and /or in the community):  No (Comment)  Discharge Needs  Concerns to be addressed:  Basic Needs, Home Safety Concerns, Mental Health Concerns, Discharge Planning Concerns Readmission within the last 30 days:  No Current discharge risk:    Barriers to Discharge:  Other (uncertain at this time if he is safe (physically or mentally to be home alone))   Ludwig Clarks, LCSW 01/30/2015, 1:32 PM

## 2015-01-30 NOTE — Progress Notes (Signed)
Inpatient Diabetes Program Recommendations  AACE/ADA: New Consensus Statement on Inpatient Glycemic Control (2015)  Target Ranges:  Prepandial:   less than 140 mg/dL      Peak postprandial:   less than 180 mg/dL (1-2 hours)      Critically ill patients:  140 - 180 mg/dL   Review of Glycemic Control  A1C=11.7 from 12/28 Diabetes Coordinator visited patient 12/29 (see note).  Thank you  Piedad ClimesGina Mckennon Zwart BSN, RN,CDE Inpatient Diabetes Coordinator [phone] (team pager)

## 2015-01-30 NOTE — Evaluation (Signed)
Physical Therapy Evaluation Patient Details Name: Bradley Benitez MRN: [id number] DOB: November 09, 1950 Today's Date: 01/30/2015   History of Present Illness  65 y/o male with diabetes but currently does not go to the doctor or take any medications presented to the [facility] emergency department on 01/23/2015 complaining of shortness of breath. In the emergency department he was noted to be profoundly hypoxemic, requiring BiPAP for respiratory assistance, he had an elevated lactic acid. He was given antibiotics for presumed sepsis.Tentative plan for CABG 02/01/2015  Clinical Impression  Patient presents at [facility] assist level for ambulation without device.  Prior to admission was functioning independently including yard work, Catering manager.  Feel he will benefit from skilled PT in the acute setting to allow return home after SNF level rehab stay once he has his CABG procedure.  Patient will benefit from nursing assist for ambulation at least up until CABG prodedure.      Follow Up Recommendations SNF    Equipment Recommendations  Other (comment) (TBA)    Recommendations for Other Services       Precautions / Restrictions Precautions Precautions: Fall      Mobility  Bed Mobility Overal bed mobility: Needs Assistance Bed Mobility: Rolling;Sidelying to Sit Rolling: Supervision Sidelying to sit: Modified independent (Device/Increase time)       General bed mobility comments: self selected technique due to pt fearful of inability to move after surgery since he "won't be able to use his arms" to keep his chest from cracking open  Transfers Overall transfer level: Needs assistance   Transfers: Sit to/from Stand Sit to Stand: Min guard         General transfer comment: VCs for technique to not use his arms to help push up pending possible CABG 2 days from now (Friday)  Ambulation/Gait Ambulation/Gait assistance: Min guard Ambulation Distance (Feet): 150 Feet   Gait Pattern/deviations:  Step-through pattern;Trunk flexed     General Gait Details: keeps head down and eyes focused on bottom of IV pole and focused on deep breathing to help feel less SOB  Stairs            Wheelchair Mobility    Modified Rankin (Stroke Patients Only)       Balance Overall balance assessment: Needs assistance Sitting-balance support: Feet supported;No upper extremity supported Sitting balance-Leahy Scale: Good     Standing balance support: No upper extremity supported Standing balance-Leahy Scale: Fair Standing balance comment: balances without UE support statically, minguard for safety with ambulation without device, mild LOB turning R                              Pertinent Vitals/Pain Pain Assessment: No/denies pain    Home Living Family/patient expects to be discharged to:: Skilled nursing facility Living Arrangements: Alone                    Prior Function Level of Independence: Independent               Hand Dominance   Dominant Hand: Right    Extremity/Trunk Assessment   Upper Extremity Assessment: Overall WFL for tasks assessed           Lower Extremity Assessment: RLE deficits/detail;LLE deficits/detail RLE Deficits / Details: strength and AROM grossly WFL, limited sensation in feet due to prolonged h/o neuropathy, edema in feet and notes his toe nails will fall off so keeps them on till they are gone  with tape LLE Deficits / Details: strength and AROM grossly WFL, limited sensation in feet due to prolonged h/o neuropathy; noted callous and ulcer under L 1st MT head     Communication   Communication: No difficulties  Cognition Arousal/Alertness: Awake/alert Behavior During Therapy: WFL for tasks assessed/performed Overall Cognitive Status: Impaired/Different from baseline Area of Impairment: Memory;Safety/judgement     Memory: Decreased short-term memory   Safety/Judgement: Decreased awareness of safety     General  Comments: continual reminders about keeping R arm below his head due to PICC, but continues to place hand under his head when lying in bed; odd behaviors including reports 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 and reports he has written about it (terabytes on his computer) and kept track of alien events as a hobby, only interacts with one person per day and only goes out of his home once a week    General Comments      Exercises        Assessment/Plan    PT Assessment Patient needs continued PT services  PT Diagnosis Abnormality of gait   PT Problem List Impaired sensation;Decreased mobility;Decreased balance;Decreased safety awareness;Decreased activity tolerance  PT Treatment Interventions DME instruction;Balance training;Gait training;Stair training;Functional mobility training;Patient/family education;Therapeutic activities;Therapeutic exercise   PT Goals (Current goals can be found in the Care Plan section) Acute Rehab PT Goals Patient Stated Goal: To get home, breathe better PT Goal Formulation: With patient Time For Goal Achievement: 02/13/15 Potential to Achieve Goals: Good    Frequency Min 3X/week   Barriers to discharge        Co-evaluation               End of Session Equipment Utilized During Treatment: Gait belt Activity Tolerance: Patient tolerated treatment well Patient left: in bed;with call bell/phone within reach;with bed alarm set           Time: 1435-1505 PT Time Calculation (min) (ACUTE ONLY): 30 min   Charges:   PT Evaluation $PT Eval Moderate Complexity: 1 Procedure PT Treatments $Gait Training: 8-22 mins   PT G CodesElray Mcgregor:        Cynthia Wynn 01/30/2015, 3:32 PM  Sheran Lawlessyndi Wynn, PT [phone] 01/30/2015

## 2015-01-31 ENCOUNTER — Encounter (HOSPITAL_COMMUNITY): Payer: Self-pay | Admitting: Certified Registered Nurse Anesthetist

## 2015-01-31 ENCOUNTER — Inpatient Hospital Stay (HOSPITAL_COMMUNITY): Payer: PPO

## 2015-01-31 DIAGNOSIS — I2510 Atherosclerotic heart disease of native coronary artery without angina pectoris: Secondary | ICD-10-CM

## 2015-01-31 LAB — CBC
HEMATOCRIT: 32.8 % — AB (ref 39.0–52.0)
HEMOGLOBIN: 11.1 g/dL — AB (ref 13.0–17.0)
MCH: 27.7 pg (ref 26.0–34.0)
MCHC: 33.8 g/dL (ref 30.0–36.0)
MCV: 81.8 fL (ref 78.0–100.0)
Platelets: 169 10*3/uL (ref 150–400)
RBC: 4.01 MIL/uL — AB (ref 4.22–5.81)
RDW: 13 % (ref 11.5–15.5)
WBC: 6.5 10*3/uL (ref 4.0–10.5)

## 2015-01-31 LAB — ABO/RH: ABO/RH(D): B POS

## 2015-01-31 LAB — URINALYSIS, ROUTINE W REFLEX MICROSCOPIC
Bilirubin Urine: NEGATIVE
Glucose, UA: NEGATIVE mg/dL
Hgb urine dipstick: NEGATIVE
Ketones, ur: NEGATIVE mg/dL
Leukocytes, UA: NEGATIVE
Nitrite: NEGATIVE
Protein, ur: NEGATIVE mg/dL
Specific Gravity, Urine: 1.015 (ref 1.005–1.030)
pH: 5 (ref 5.0–8.0)

## 2015-01-31 LAB — BASIC METABOLIC PANEL
ANION GAP: 7 (ref 5–15)
BUN: 20 mg/dL (ref 6–20)
CALCIUM: 8.7 mg/dL — AB (ref 8.9–10.3)
CO2: 27 mmol/L (ref 22–32)
Chloride: 107 mmol/L (ref 101–111)
Creatinine, Ser: 1.31 mg/dL — ABNORMAL HIGH (ref 0.61–1.24)
GFR, EST NON AFRICAN AMERICAN: 55 mL/min — AB (ref >60)
Glucose, Bld: 181 mg/dL — ABNORMAL HIGH (ref 65–99)
POTASSIUM: 3.7 mmol/L (ref 3.5–5.1)
Sodium: 141 mmol/L (ref 135–145)

## 2015-01-31 LAB — HEMOGLOBIN A1C
Hgb A1c MFr Bld: 9.9 % — ABNORMAL HIGH (ref 4.8–5.6)
Mean Plasma Glucose: 237 mg/dL

## 2015-01-31 LAB — CARBOXYHEMOGLOBIN
CARBOXYHEMOGLOBIN: 1 % (ref 0.5–1.5)
Carboxyhemoglobin: 1.1 % (ref 0.5–1.5)
Methemoglobin: 0.9 % (ref 0.0–1.5)
Methemoglobin: 0.6 % (ref 0.0–1.5)
O2 SAT: 52.5 %
O2 SAT: 50.5 %
TOTAL HEMOGLOBIN: 12.4 g/dL — AB (ref 13.5–18.0)
Total hemoglobin: 18.5 g/dL — ABNORMAL HIGH (ref 13.5–18.0)

## 2015-01-31 LAB — GLUCOSE, CAPILLARY
GLUCOSE-CAPILLARY: 109 mg/dL — AB (ref 65–99)
GLUCOSE-CAPILLARY: 137 mg/dL — AB (ref 65–99)
Glucose-Capillary: 199 mg/dL — ABNORMAL HIGH (ref 65–99)
Glucose-Capillary: 245 mg/dL — ABNORMAL HIGH (ref 65–99)
Glucose-Capillary: 163 mg/dL — ABNORMAL HIGH (ref 65–99)
Glucose-Capillary: 198 mg/dL — ABNORMAL HIGH (ref 65–99)
Glucose-Capillary: 233 mg/dL — ABNORMAL HIGH (ref 65–99)
Glucose-Capillary: 133 mg/dL — ABNORMAL HIGH (ref 65–99)

## 2015-01-31 LAB — BLOOD GAS, ARTERIAL
Acid-base deficit: 0.6 mmol/L (ref 0.0–2.0)
Bicarbonate: 22.6 mEq/L (ref 20.0–24.0)
Drawn by: 398661
FIO2: 0.21
O2 Saturation: 98.1 %
Patient temperature: 98.6
TCO2: 23.6 mmol/L (ref 0–100)
pCO2 arterial: 31.6 mmHg — ABNORMAL LOW (ref 35.0–45.0)
pH, Arterial: 7.469 — ABNORMAL HIGH (ref 7.350–7.450)
pO2, Arterial: 100 mmHg (ref 80.0–100.0)

## 2015-01-31 LAB — PREPARE RBC (CROSSMATCH)

## 2015-01-31 LAB — HEPARIN LEVEL (UNFRACTIONATED): Heparin Unfractionated: 0.37 IU/mL (ref 0.30–0.70)

## 2015-01-31 MED ORDER — METOPROLOL TARTRATE 12.5 MG HALF TABLET
12.5000 mg | ORAL_TABLET | Freq: Once | ORAL | Status: AC
  Administered 2015-02-01: 12.5 mg via ORAL
  Filled 2015-01-31: qty 1

## 2015-01-31 MED ORDER — DOPAMINE-DEXTROSE 3.2-5 MG/ML-% IV SOLN
0.0000 ug/kg/min | INTRAVENOUS | Status: DC
  Filled 2015-01-31: qty 250

## 2015-01-31 MED ORDER — VANCOMYCIN HCL 10 G IV SOLR
1250.0000 mg | INTRAVENOUS | Status: AC
  Administered 2015-02-01: 1250 mg via INTRAVENOUS
  Filled 2015-01-31: qty 1250

## 2015-01-31 MED ORDER — CHLORHEXIDINE GLUCONATE 0.12 % MT SOLN
15.0000 mL | Freq: Once | OROMUCOSAL | Status: AC
  Administered 2015-02-01: 15 mL via OROMUCOSAL
  Filled 2015-01-31: qty 15

## 2015-01-31 MED ORDER — MAGNESIUM SULFATE 50 % IJ SOLN
40.0000 meq | INTRAMUSCULAR | Status: DC
  Filled 2015-01-31: qty 10

## 2015-01-31 MED ORDER — TEMAZEPAM 15 MG PO CAPS
15.0000 mg | ORAL_CAPSULE | Freq: Once | ORAL | Status: DC | PRN
Start: 2015-01-31 — End: 2015-02-01

## 2015-01-31 MED ORDER — DEXTROSE 5 % IV SOLN
750.0000 mg | INTRAVENOUS | Status: DC
  Filled 2015-01-31: qty 750

## 2015-01-31 MED ORDER — SODIUM CHLORIDE 0.9 % IV SOLN
INTRAVENOUS | Status: DC
  Filled 2015-01-31: qty 30

## 2015-01-31 MED ORDER — EPINEPHRINE HCL 1 MG/ML IJ SOLN
0.0000 ug/min | INTRAVENOUS | Status: AC
  Administered 2015-02-01: 1 ug/min via INTRAVENOUS
  Filled 2015-01-31: qty 4

## 2015-01-31 MED ORDER — NITROGLYCERIN IN D5W 200-5 MCG/ML-% IV SOLN
2.0000 ug/min | INTRAVENOUS | Status: DC
  Filled 2015-01-31: qty 250

## 2015-01-31 MED ORDER — PHENYLEPHRINE HCL 10 MG/ML IJ SOLN
30.0000 ug/min | INTRAVENOUS | Status: AC
  Administered 2015-02-01: 25 ug/min via INTRAVENOUS
  Filled 2015-01-31: qty 2

## 2015-01-31 MED ORDER — SODIUM CHLORIDE 0.9 % IV SOLN
INTRAVENOUS | Status: AC
Start: 2015-02-01 — End: 2015-02-02
  Administered 2015-02-01: 1.3 [IU]/h via INTRAVENOUS
  Filled 2015-01-31: qty 2.5

## 2015-01-31 MED ORDER — BISACODYL 5 MG PO TBEC: 5.0000 mg | DELAYED_RELEASE_TABLET | Freq: Once | ORAL | Status: DC

## 2015-01-31 MED ORDER — POTASSIUM CHLORIDE 2 MEQ/ML IV SOLN
80.0000 meq | INTRAVENOUS | Status: DC
Start: 2015-02-01 — End: 2015-02-01
  Filled 2015-01-31: qty 40

## 2015-01-31 MED ORDER — CHLORHEXIDINE GLUCONATE 4 % EX LIQD
60.0000 mL | Freq: Once | CUTANEOUS | Status: AC
  Administered 2015-02-01: 4 via TOPICAL
  Filled 2015-01-31: qty 60

## 2015-01-31 MED ORDER — DEXMEDETOMIDINE HCL IN NACL 400 MCG/100ML IV SOLN
0.1000 ug/kg/h | INTRAVENOUS | Status: AC
  Administered 2015-02-01: .3 ug/kg/h via INTRAVENOUS
  Filled 2015-01-31: qty 100

## 2015-01-31 MED ORDER — CHLORHEXIDINE GLUCONATE 4 % EX LIQD
60.0000 mL | Freq: Once | CUTANEOUS | Status: AC
  Administered 2015-01-31: 4 via TOPICAL
  Filled 2015-01-31: qty 60

## 2015-01-31 MED ORDER — ALPRAZOLAM 0.25 MG PO TABS: 0.2500 mg | ORAL_TABLET | ORAL | Status: DC | PRN

## 2015-01-31 MED ORDER — PLASMA-LYTE 148 IV SOLN
INTRAVENOUS | Status: AC
  Administered 2015-02-01: 500 mL
  Filled 2015-01-31: qty 2.5

## 2015-01-31 MED ORDER — MILRINONE IN DEXTROSE 20 MG/100ML IV SOLN
0.2500 ug/kg/min | INTRAVENOUS | Status: DC
  Administered 2015-01-31: 0.25 ug/kg/min via INTRAVENOUS
  Filled 2015-01-31 (×2): qty 100

## 2015-01-31 MED ORDER — MILRINONE IN DEXTROSE 20 MG/100ML IV SOLN
0.2500 ug/kg/min | INTRAVENOUS | Status: DC
  Administered 2015-01-31 (×2): 0.25 ug/kg/min via INTRAVENOUS
  Filled 2015-01-31: qty 100

## 2015-01-31 MED ORDER — DEXTROSE 5 % IV SOLN
1.5000 g | INTRAVENOUS | Status: AC
  Administered 2015-02-01: 1.5 g via INTRAVENOUS
  Administered 2015-02-01: .75 g via INTRAVENOUS
  Filled 2015-01-31: qty 1.5

## 2015-01-31 MED ORDER — SODIUM CHLORIDE 0.9 % IV SOLN
INTRAVENOUS | Status: AC
  Administered 2015-02-01: 69.8 mL/h via INTRAVENOUS
  Filled 2015-01-31: qty 40

## 2015-01-31 NOTE — Progress Notes (Signed)
CARDIAC REHAB PHASE I   PRE:  Rate/Rhythm: 85 SR  BP:  Sitting: 134/79        SaO2: 99 RA  MODE:  Ambulation: 150 ft   POST:  Rate/Rhythm: 98 SR  BP:  Sitting: 115/73         SaO2: 100 RA  Pt ambulated 150 ft on RA, IV, assist x1, fairly steady gait, tolerated well. Pt performed "deep breathing meditation" during ambulation, did c/o of mild chest heaviness, resolved upon returning to room, pt states "I can use reiki touch to heal it." VSS. Denies CP, dizziness. Completed cardiac surgery pre-op education with pt and pt's sister-in-law at bedside. Reviewed sternal precautions, IS, activity progression, cardiac surgery booklet and cardiac surgery guidelines. Gave pt instructions to view cardiac surgery videos, pt verbalized understanding. Discussed the possible need for rehab upon discharge, pt states "I would not be opposed to rehab. I understand I'm going to need help." Pt states he is "very scared of surgery." Emotional support given to patient. Pt to recliner after walk, call bell within reach. Will follow.  [phone] Joylene GrapesMonge, Aireonna Bauer C, RN, BSN 01/31/2015 11:32 AM

## 2015-01-31 NOTE — Progress Notes (Addendum)
Pre-op Cardiac Surgery  Carotid Findings:  1-39% ICA plaquing.  Vertebral artery flow is antegrade.   Upper Extremity Right Left  Brachial Pressures restricted 103T  Radial Waveforms T T  Ulnar Waveforms T T  Palmar Arch (Allen's Test) Doppler signal remains normal with radial compression and obliterates with ulnar compression WNL   Findings:      Lower  Extremity Right Left  Dorsalis Pedis    Anterior Tibial 132B 148B  Posterior Tibial 137B 144B  Ankle/Brachial Indices >1 >1    Findings:  WNL

## 2015-01-31 NOTE — Clinical Social Work Note (Signed)
Patient is currently refusing SNF at time of discharge.  Lives alone.  RNCM made aware.    Vickii PennaGina Cerinity Zynda, LCSW [phone]  [facility]H 15-28  Licensed Clinical Social Worker

## 2015-01-31 NOTE — Progress Notes (Signed)
Patient ID: Bradley Benitez, male   DOB: September 02, 1950, 65 y.o.   MRN: [id number]   65 yo with history of DM and smoking but has not had medical care in decades presented with acute systolic CHF.  Echo showed EF 25-30% with apical thrombus and LHC showed 3VD.    He remains on heparin gtt with LV thrombus.  CVP 6 today but co-ox down, 52% and repeat 50%.   Walked with cardiac rehab.  No dyspnea but chest heavy at the end of the walk.   LHC/RHC (12/16):  RA mean 11 PA 35/11 PCWP mean 23 CI 2.59   Ost RPDA lesion, 70% stenosed.  1st RPLB lesion, 80% stenosed.  Mid Cx lesion, 80% stenosed.  Prox LAD lesion, 60% stenosed.  Mid LAD lesion, 95% stenosed.  Ost 2nd Diag lesion, 70% stenosed.  Ost Ramus to Ramus lesion, 90% stenosed  Cardiac MRI (1/17): 1. Severe LV systolic dysfunction, EF 26%. Wall motion abnormalities as above. 2. Normal RV size and systolic function . 3. LGE pattern suggestive of infarction in LAD territory. The mid anterior/anteroseptal and apical anterior/septal/inferior wall segments and the true apex do not appear viable (probably not likely to improve function with revascularization). The remainder of the left ventricle appears viable. 4. There is a small LV apical thrombus.  Scheduled Meds: . antiseptic oral rinse  7 mL Mouth Rinse BID  . aspirin EC  81 mg Oral Daily  . atorvastatin  80 mg Oral q1800  . budesonide-formoterol  2 puff Inhalation BID  . carvedilol  3.125 mg Oral BID WC  . coumadin book   Does not apply Once  . feeding supplement (ENSURE ENLIVE)  237 mL Oral BID BM  . gabapentin  300 mg Oral BID  . insulin aspart  0-15 Units Subcutaneous 6 times per day  . insulin glargine  15 Units Subcutaneous Daily  . isosorbide mononitrate  30 mg Oral Daily  . lisinopril  5 mg Oral Daily  . pantoprazole  40 mg Oral Daily  . sodium chloride  10-40 mL Intracatheter Q12H  . sodium chloride  3 mL Intravenous Q12H  . sodium chloride  3 mL  Intravenous Q12H  . spironolactone  25 mg Oral Daily   Continuous Infusions: . heparin 1,200 Units/hr (01/31/15 0800)  . milrinone 0.25 mcg/kg/min (01/31/15 0939)  . milrinone     PRN Meds:.sodium chloride, sodium chloride, sodium chloride, acetaminophen, hydrALAZINE, iohexol, ondansetron (ZOFRAN) IV, sodium chloride, sodium chloride, sodium chloride, sodium chloride, traZODone    Filed Vitals:   01/31/15 0300 01/31/15 0341 01/31/15 0734 01/31/15 1100  BP:  95/66 85/55 115/73  Pulse:  75 70 87  Temp:  97.5 F (36.4 C) 97.9 F (36.6 C)   TempSrc:  Oral Oral   Resp:  18 18   Height:      Weight: 227 lb 4.7 oz (103.1 kg) 227 lb 4.7 oz (103.1 kg)    SpO2:  94% 98% 100%    Intake/Output Summary (Last 24 hours) at 01/31/15 1201 Last data filed at 01/31/15 0800  Gross per 24 hour  Intake   1040 ml  Output   2000 ml  Net   -960 ml    LABS: Basic Metabolic Panel:  Recent Labs  40/98/11 0310 01/31/15 0330  NA 142 141  K 3.6 3.7  CL 109 107  CO2 26 27  GLUCOSE 184* 181*  BUN 18 20  CREATININE 1.29* 1.31*  CALCIUM 8.5* 8.7*   Liver Function  Tests: No results for input(s): AST, ALT, ALKPHOS, BILITOT, PROT, ALBUMIN in the last 72 hours. No results for input(s): LIPASE, AMYLASE in the last 72 hours. CBC:  Recent Labs  01/30/15 0310 01/31/15 0330  WBC 6.1 6.5  HGB 10.8* 11.1*  HCT 31.7* 32.8*  MCV 81.7 81.8  PLT 176 169   Cardiac Enzymes: No results for input(s): CKTOTAL, CKMB, CKMBINDEX, TROPONINI in the last 72 hours. BNP: Invalid input(s): POCBNP D-Dimer: No results for input(s): DDIMER in the last 72 hours. Hemoglobin A1C:  Recent Labs  01/30/15 0310  HGBA1C 9.9*   Fasting Lipid Panel: No results for input(s): CHOL, HDL, LDLCALC, TRIG, CHOLHDL, LDLDIRECT in the last 72 hours. Thyroid Function Tests: No results for input(s): TSH, T4TOTAL, T3FREE, THYROIDAB in the last 72 hours.  Invalid input(s): FREET3 Anemia Panel: No results for input(s):  VITAMINB12, FOLATE, FERRITIN, TIBC, IRON, RETICCTPCT in the last 72 hours.  RADIOLOGY: Dg Chest 2 View  01/26/2015  CLINICAL DATA:  Shortness of breath this AM; h/o diabetes; former smoker EXAM: CHEST  2 VIEW COMPARISON:  01/25/2015 FINDINGS: Heart size is normal. There is prominence of interstitial markings, increased. Perihilar peribronchial thickening and noted. There are no focal consolidations. There are small bilateral pleural effusions. IMPRESSION: 1. Increased bronchitic changes. 2. Increased bilateral pleural effusions. Electronically Signed   By: Norva Pavlov M.D.   On: 01/26/2015 10:34   Dg Chest Port 1 View  01/25/2015  CLINICAL DATA:  Acute respiratory failure with hypoxemia.  Fever. EXAM: PORTABLE CHEST 1 VIEW COMPARISON:  01/23/2015. FINDINGS: Grossly stable enlarged cardiac silhouette. Prominent pulmonary vasculature and interstitial markings with improvement. Interval small amount of linear density in the right mid lung zone. Minimal right pleural fluid. Central peribronchial thickening. Unremarkable bones. IMPRESSION: 1. Improving changes of congestive heart failure. 2. Small amount of linear atelectasis in the right mid lung zone. 3. Moderate bronchitic changes. Electronically Signed   By: Beckie Salts M.D.   On: 01/25/2015 16:34   Dg Chest Port 1 View  01/23/2015  CLINICAL DATA:  Congestive heart failure EXAM: PORTABLE CHEST 1 VIEW COMPARISON:  None. FINDINGS: There is generalized interstitial edema with patchy alveolar edema in the bases. Heart is enlarged with pulmonary venous hypertension. No adenopathy. IMPRESSION: Evidence of congestive heart failure with edema most pronounced in the lung bases. Electronically Signed   By: Bretta Bang III M.D.   On: 01/23/2015 09:34   Mr Card Morphology Wo/w Cm  01/29/2015  CLINICAL DATA:  CAD, assess for viability EXAM: CARDIAC MRI TECHNIQUE: The patient was scanned on a 1.5 Tesla GE magnet. A dedicated cardiac coil was used.  Functional imaging was done using Fiesta sequences. 2,3, and 4 chamber views were done to assess for RWMA's. Modified Simpson's rule using a short axis stack was used to calculate an ejection fraction on a dedicated work [employer]. The patient received 34 cc of Multihance. After 10 minutes inversion recovery sequences were used to assess for infiltration and scar tissue. CONTRAST:  34 cc Multihance FINDINGS: There were bilateral small to moderate pleural effusions. There was a trivial pericardial effusion. Normal left ventricular size. EF 26% with mid anteroseptal and mid anterior akinesis. The mid anterolateral wall was hypokinetic. The apical septal, inferior, and anterior segments were akinetic and the apical lateral segment was hypokinetic. The true apex was akinetic. A small apical thrombus was noted. The atria were normal in size. The right ventricle was normal in size and systolic function. The aortic valve  was trileaflet with no stenosis or significant regurgitation. There did not appear to be significant mitral regurgitation. On delayed enhancement imaging, there was subendocardial 76-99% wall thickness late gadolinium enhancement (LGE) in the mid anteroseptal and mid anterior wall segments, in the apical septal/anterior/inferior wall segments, and in the true apex. Basal segments without LGE, anterolateral wall without significant LGE, and mid inferior wall without LGE. MEASUREMENTS: MEASUREMENTS LV EDV 212 mL LV SV 54 mL LV EF 26% IMPRESSION: 1. Severe LV systolic dysfunction, EF 26%. Wall motion abnormalities as above. 2.  Normal RV size and systolic function . 3. LGE pattern suggestive of infarction in LAD territory. The mid anterior/anteroseptal and apical anterior/septal/inferior wall segments and the true apex do not appear viable (probably not likely to improve function with revascularization). The remainder of the left ventricle appears viable. 4.  There is a small LV apical  thrombus. Sharika Mosquera Electronically Signed   By: Marca Anconaalton  Serafino Burciaga M.D.   On: 01/29/2015 09:10    PHYSICAL EXAM General: NAD Neck: JVP 7 cm, no thyromegaly or thyroid nodule.  Lungs: Decreased breath sounds at bases bilaterally.  CV: Nondisplaced PMI.  Heart regular S1/S2, no S3/S4, no murmur.  R and LLE trace edema. No carotid bruit.  Normal pedal pulses.  Abdomen: Soft, nontender, no hepatosplenomegaly, no distention.  Neurologic: Alert and oriented x 3.  Psych: Normal affect. Extremities: No clubbing or cyanosis. Lesions on toes noted.   TELEMETRY: Reviewed telemetry pt in NSR  ASSESSMENT AND PLAN: 65 yo with history of DM and smoking but has not had medical care in decades presented with acute systolic CHF.  Echo showed EF 25-30% with apical thrombus and LHC showed 3VD.  1. Acute systolic CHF: EF 86-57%25-30% with regional WMAs, ischemic cardiomyopathy.  Volume status better with diuresis, CVP 6.  Co-ox low today.  - Stop IV Lasix today.  - Will start milrinone 0.25 mcg/kg/min with evidence for low output.  - Continue current Coreg, spironolactone, and lisinopril.  No BP room to titrate.  2. CAD: Diffuse 3 vessel disease, see cath report above. MRI reviewed: LAD territory does not appear to have significant viability.  Remainder of LV myocardium does appear viable.  He has severe disease in LCx and RCA territory that may benefit from revascularization but suspect LAD territory will not recover.  Discussed with Dr Donata ClayVan Trigt, tentative plan for CABG Friday.  - Continue ASA, atorvastatin 80 daily and heparin gtt.  - Continue Imdur. - CABG on Friday.   3. LV mural thrombus: Continue heparin gtt.  Will need warfarin long-term.  4. Diabetes: Continue insulin.  5. Foot lesions: WOC consult appreciated. Continue dry dressing. Peripheral arterial dopplers still pending.   6. Diabetic neuropathy: Symptomatic neuropathy.  Added gabapentin 300 mg bid.   Marca AnconaDalton Allessandra Bernardi 01/31/2015 12:01 PM

## 2015-01-31 NOTE — Care Management Important Message (Signed)
Important Message  Patient Details  Name: Bradley PilaDavid M Poffenberger MRN: [id number] Date of Birth: [birth year]   Medicare Important Message Given:  Yes    Chesnie Capell P Naryah Clenney 01/31/2015, 1:37 PM

## 2015-01-31 NOTE — Progress Notes (Signed)
ANTICOAGULATION CONSULT NOTE - Follow Up Consult  Pharmacy Consult for Heparin  Indication: Apical thrombus  Allergies  Allergen Reactions  . Codeine Other (See Comments)    intolerance   Patient Measurements: Height: 6\' 1"  (185.4 cm) Weight: 228 lb 6.3 oz (103.6 kg) IBW/kg (Calculated) : 79.9  Vital Signs: Temp: 97.6 F (36.4 C) (01/04 2317) Temp Source: Oral (01/04 2317) BP: 91/71 mmHg (01/04 1925) Pulse Rate: 70 (01/04 2317)  Labs:  Recent Labs  01/28/15 0249 01/28/15 0836 01/29/15 0304  01/30/15 0310 01/30/15 1130 01/30/15 1830 01/30/15 2345  HGB 12.1*  --  10.9*  --  10.8*  --   --   --   HCT 35.1*  --  31.8*  --  31.7*  --   --   --   PLT 196  --  174  --  176  --   --   --   LABPROT  --   --   --   --  14.9  --   --   --   INR  --   --   --   --  1.15  --   --   --   HEPARINUNFRC 0.46  --  1.10*  < > 0.94* 0.72* 0.63 0.37  CREATININE  --  0.97 1.18  --  1.29*  --   --   --   < > = values in this interval not displayed.  Estimated Creatinine Clearance: 71.2 mL/min (by C-G formula based on Cr of 1.29).  Assessment: Heparin level is now therapeutic x 2 after rate decrease  Goal of Therapy:  Heparin level 0.3-0.7 units/ml Monitor platelets by anticoagulation protocol: Yes   Plan:  -Continue heparin at 1200 units/hr -Daily CBC/HL -Monitor for bleeding  Bradley Benitez, Bradley Benitez 01/31/2015,12:54 AM

## 2015-02-01 ENCOUNTER — Encounter (HOSPITAL_COMMUNITY): Payer: Self-pay | Admitting: Certified Registered Nurse Anesthetist

## 2015-02-01 ENCOUNTER — Inpatient Hospital Stay (HOSPITAL_COMMUNITY): Payer: PPO | Admitting: Certified Registered Nurse Anesthetist

## 2015-02-01 ENCOUNTER — Inpatient Hospital Stay (HOSPITAL_COMMUNITY): Payer: PPO

## 2015-02-01 ENCOUNTER — Encounter (HOSPITAL_COMMUNITY)
Admission: EM | Disposition: A | Discharge status: Skilled Nursing Facility | Payer: PPO | Source: Home / Self Care | Attending: Cardiothoracic Surgery

## 2015-02-01 DIAGNOSIS — Z951 Presence of aortocoronary bypass graft: Secondary | ICD-10-CM

## 2015-02-01 DIAGNOSIS — I25110 Atherosclerotic heart disease of native coronary artery with unstable angina pectoris: Secondary | ICD-10-CM

## 2015-02-01 HISTORY — PX: TEE WITHOUT CARDIOVERSION: SHX5443

## 2015-02-01 HISTORY — PX: PLACEMENT OF CENTRIMAG VENTRICULAR ASSIST DEVICE: SHX6226

## 2015-02-01 HISTORY — PX: CORONARY ARTERY BYPASS GRAFT: SHX141

## 2015-02-01 LAB — POCT I-STAT, CHEM 8
BUN: 21 mg/dL — AB (ref 6–20)
BUN: 18 mg/dL (ref 6–20)
BUN: 20 mg/dL (ref 6–20)
BUN: 19 mg/dL (ref 6–20)
BUN: 20 mg/dL (ref 6–20)
BUN: 18 mg/dL (ref 6–20)
BUN: 17 mg/dL (ref 6–20)
BUN: 16 mg/dL (ref 6–20)
CALCIUM ION: 1.13 mmol/L (ref 1.13–1.30)
CALCIUM ION: 1.15 mmol/L (ref 1.13–1.30)
CALCIUM ION: 1.13 mmol/L (ref 1.13–1.30)
CALCIUM ION: 1.22 mmol/L (ref 1.13–1.30)
CALCIUM ION: 1.19 mmol/L (ref 1.13–1.30)
CHLORIDE: 101 mmol/L (ref 101–111)
CHLORIDE: 100 mmol/L — AB (ref 101–111)
CHLORIDE: 101 mmol/L (ref 101–111)
CHLORIDE: 100 mmol/L — AB (ref 101–111)
CHLORIDE: 99 mmol/L — AB (ref 101–111)
CREATININE: 1 mg/dL (ref 0.61–1.24)
CREATININE: 1.1 mg/dL (ref 0.61–1.24)
CREATININE: 0.8 mg/dL (ref 0.61–1.24)
CREATININE: 0.8 mg/dL (ref 0.61–1.24)
CREATININE: 0.8 mg/dL (ref 0.61–1.24)
Calcium, Ion: 1.23 mmol/L (ref 1.13–1.30)
Calcium, Ion: 1.13 mmol/L (ref 1.13–1.30)
Calcium, Ion: 1.24 mmol/L (ref 1.13–1.30)
Chloride: 101 mmol/L (ref 101–111)
Chloride: 102 mmol/L (ref 101–111)
Chloride: 104 mmol/L (ref 101–111)
Creatinine, Ser: 0.8 mg/dL (ref 0.61–1.24)
Creatinine, Ser: 0.9 mg/dL (ref 0.61–1.24)
Creatinine, Ser: 1 mg/dL (ref 0.61–1.24)
GLUCOSE: 162 mg/dL — AB (ref 65–99)
GLUCOSE: 162 mg/dL — AB (ref 65–99)
GLUCOSE: 175 mg/dL — AB (ref 65–99)
GLUCOSE: 179 mg/dL — AB (ref 65–99)
GLUCOSE: 162 mg/dL — AB (ref 65–99)
Glucose, Bld: 123 mg/dL — ABNORMAL HIGH (ref 65–99)
Glucose, Bld: 171 mg/dL — ABNORMAL HIGH (ref 65–99)
Glucose, Bld: 109 mg/dL — ABNORMAL HIGH (ref 65–99)
HCT: 26 % — ABNORMAL LOW (ref 39.0–52.0)
HCT: 23 % — ABNORMAL LOW (ref 39.0–52.0)
HCT: 23 % — ABNORMAL LOW (ref 39.0–52.0)
HCT: 22 % — ABNORMAL LOW (ref 39.0–52.0)
HCT: 23 % — ABNORMAL LOW (ref 39.0–52.0)
HEMATOCRIT: 30 % — AB (ref 39.0–52.0)
HEMATOCRIT: 27 % — AB (ref 39.0–52.0)
HEMATOCRIT: 22 % — AB (ref 39.0–52.0)
HEMOGLOBIN: 9.2 g/dL — AB (ref 13.0–17.0)
HEMOGLOBIN: 7.8 g/dL — AB (ref 13.0–17.0)
HEMOGLOBIN: 7.8 g/dL — AB (ref 13.0–17.0)
HEMOGLOBIN: 7.5 g/dL — AB (ref 13.0–17.0)
Hemoglobin: 10.2 g/dL — ABNORMAL LOW (ref 13.0–17.0)
Hemoglobin: 8.8 g/dL — ABNORMAL LOW (ref 13.0–17.0)
Hemoglobin: 7.8 g/dL — ABNORMAL LOW (ref 13.0–17.0)
Hemoglobin: 7.5 g/dL — ABNORMAL LOW (ref 13.0–17.0)
POTASSIUM: 3.6 mmol/L (ref 3.5–5.1)
POTASSIUM: 3.6 mmol/L (ref 3.5–5.1)
POTASSIUM: 3.8 mmol/L (ref 3.5–5.1)
Potassium: 4 mmol/L (ref 3.5–5.1)
Potassium: 4.6 mmol/L (ref 3.5–5.1)
Potassium: 4.6 mmol/L (ref 3.5–5.1)
Potassium: 4 mmol/L (ref 3.5–5.1)
Potassium: 4.3 mmol/L (ref 3.5–5.1)
SODIUM: 140 mmol/L (ref 135–145)
SODIUM: 138 mmol/L (ref 135–145)
Sodium: 139 mmol/L (ref 135–145)
Sodium: 140 mmol/L (ref 135–145)
Sodium: 138 mmol/L (ref 135–145)
Sodium: 138 mmol/L (ref 135–145)
Sodium: 138 mmol/L (ref 135–145)
Sodium: 140 mmol/L (ref 135–145)
TCO2: 27 mmol/L (ref 0–100)
TCO2: 26 mmol/L (ref 0–100)
TCO2: 29 mmol/L (ref 0–100)
TCO2: 29 mmol/L (ref 0–100)
TCO2: 27 mmol/L (ref 0–100)
TCO2: 28 mmol/L (ref 0–100)
TCO2: 27 mmol/L (ref 0–100)
TCO2: 23 mmol/L (ref 0–100)

## 2015-02-01 LAB — POCT I-STAT 3, ART BLOOD GAS (G3+)
Acid-Base Excess: 2 mmol/L (ref 0.0–2.0)
BICARBONATE: 28 meq/L — AB (ref 20.0–24.0)
BICARBONATE: 25.2 meq/L — AB (ref 20.0–24.0)
Bicarbonate: 23.9 mEq/L (ref 20.0–24.0)
Bicarbonate: 24.3 mEq/L — ABNORMAL HIGH (ref 20.0–24.0)
O2 SAT: 100 %
O2 SAT: 99 %
O2 Saturation: 100 %
O2 Saturation: 96 %
PCO2 ART: 40.7 mmHg (ref 35.0–45.0)
PCO2 ART: 37.9 mmHg (ref 35.0–45.0)
PH ART: 7.339 — AB (ref 7.350–7.450)
PH ART: 7.4 (ref 7.350–7.450)
PH ART: 7.443 (ref 7.350–7.450)
Patient temperature: 37.4
TCO2: 30 mmol/L (ref 0–100)
TCO2: 26 mmol/L (ref 0–100)
TCO2: 25 mmol/L (ref 0–100)
TCO2: 25 mmol/L (ref 0–100)
pCO2 arterial: 52.1 mmHg — ABNORMAL HIGH (ref 35.0–45.0)
pCO2 arterial: 35 mmHg (ref 35.0–45.0)
pH, Arterial: 7.417 (ref 7.350–7.450)
pO2, Arterial: 387 mmHg — ABNORMAL HIGH (ref 80.0–100.0)
pO2, Arterial: 394 mmHg — ABNORMAL HIGH (ref 80.0–100.0)
pO2, Arterial: 132 mmHg — ABNORMAL HIGH (ref 80.0–100.0)
pO2, Arterial: 84 mmHg (ref 80.0–100.0)

## 2015-02-01 LAB — CBC
HCT: 31.4 % — ABNORMAL LOW (ref 39.0–52.0)
HCT: 23.8 % — ABNORMAL LOW (ref 39.0–52.0)
HEMATOCRIT: 25.6 % — AB (ref 39.0–52.0)
Hemoglobin: 10.9 g/dL — ABNORMAL LOW (ref 13.0–17.0)
Hemoglobin: 8.9 g/dL — ABNORMAL LOW (ref 13.0–17.0)
Hemoglobin: 8.6 g/dL — ABNORMAL LOW (ref 13.0–17.0)
MCH: 28.1 pg (ref 26.0–34.0)
MCH: 27.8 pg (ref 26.0–34.0)
MCH: 28.5 pg (ref 26.0–34.0)
MCHC: 34.7 g/dL (ref 30.0–36.0)
MCHC: 34.8 g/dL (ref 30.0–36.0)
MCHC: 36.1 g/dL — ABNORMAL HIGH (ref 30.0–36.0)
MCV: 80.9 fL (ref 78.0–100.0)
MCV: 80 fL (ref 78.0–100.0)
MCV: 78.8 fL (ref 78.0–100.0)
Platelets: 174 10*3/uL (ref 150–400)
Platelets: 145 10*3/uL — ABNORMAL LOW (ref 150–400)
Platelets: 150 10*3/uL (ref 150–400)
RBC: 3.88 MIL/uL — ABNORMAL LOW (ref 4.22–5.81)
RBC: 3.2 MIL/uL — ABNORMAL LOW (ref 4.22–5.81)
RBC: 3.02 MIL/uL — ABNORMAL LOW (ref 4.22–5.81)
RDW: 12.8 % (ref 11.5–15.5)
RDW: 13 % (ref 11.5–15.5)
RDW: 13.3 % (ref 11.5–15.5)
WBC: 5.3 10*3/uL (ref 4.0–10.5)
WBC: 12.6 10*3/uL — AB (ref 4.0–10.5)
WBC: 10.2 10*3/uL (ref 4.0–10.5)

## 2015-02-01 LAB — GLUCOSE, CAPILLARY
GLUCOSE-CAPILLARY: 107 mg/dL — AB (ref 65–99)
GLUCOSE-CAPILLARY: 111 mg/dL — AB (ref 65–99)
Glucose-Capillary: 102 mg/dL — ABNORMAL HIGH (ref 65–99)
Glucose-Capillary: 111 mg/dL — ABNORMAL HIGH (ref 65–99)
Glucose-Capillary: 107 mg/dL — ABNORMAL HIGH (ref 65–99)

## 2015-02-01 LAB — CARBOXYHEMOGLOBIN
CARBOXYHEMOGLOBIN: 1 % (ref 0.5–1.5)
METHEMOGLOBIN: 0.9 % (ref 0.0–1.5)
O2 Saturation: 51.3 %
TOTAL HEMOGLOBIN: 11.1 g/dL — AB (ref 13.5–18.0)

## 2015-02-01 LAB — BASIC METABOLIC PANEL
Anion gap: 10 (ref 5–15)
BUN: 21 mg/dL — ABNORMAL HIGH (ref 6–20)
CO2: 26 mmol/L (ref 22–32)
Calcium: 8.6 mg/dL — ABNORMAL LOW (ref 8.9–10.3)
Chloride: 103 mmol/L (ref 101–111)
Creatinine, Ser: 1.27 mg/dL — ABNORMAL HIGH (ref 0.61–1.24)
GFR calc Af Amer: >60 mL/min (ref >60)
GFR calc non Af Amer: 57 mL/min — ABNORMAL LOW (ref >60)
Glucose, Bld: 139 mg/dL — ABNORMAL HIGH (ref 65–99)
Potassium: 3.7 mmol/L (ref 3.5–5.1)
Sodium: 139 mmol/L (ref 135–145)

## 2015-02-01 LAB — PROTIME-INR
INR: 1.44 (ref 0.00–1.49)
Prothrombin Time: 17.6 seconds — ABNORMAL HIGH (ref 11.6–15.2)

## 2015-02-01 LAB — CREATININE, SERUM
Creatinine, Ser: 1.1 mg/dL (ref 0.61–1.24)
GFR calc Af Amer: >60 mL/min (ref >60)
GFR calc non Af Amer: >60 mL/min (ref >60)

## 2015-02-01 LAB — MAGNESIUM: Magnesium: 2.7 mg/dL — ABNORMAL HIGH (ref 1.7–2.4)

## 2015-02-01 LAB — HEMOGLOBIN AND HEMATOCRIT, BLOOD
HCT: 23.2 % — ABNORMAL LOW (ref 39.0–52.0)
Hemoglobin: 8.3 g/dL — ABNORMAL LOW (ref 13.0–17.0)

## 2015-02-01 LAB — PLATELET COUNT: Platelets: 154 10*3/uL (ref 150–400)

## 2015-02-01 LAB — APTT: APTT: 32 s (ref 24–37)

## 2015-02-01 LAB — PREPARE RBC (CROSSMATCH)

## 2015-02-01 SURGERY — CORONARY ARTERY BYPASS GRAFTING (CABG)
Anesthesia: General | Site: Chest

## 2015-02-01 MED ORDER — SODIUM CHLORIDE 0.9 % IJ SOLN
OROMUCOSAL | Status: DC | PRN
  Administered 2015-02-01: 4 mL via TOPICAL

## 2015-02-01 MED ORDER — PROTAMINE SULFATE 10 MG/ML IV SOLN
INTRAVENOUS | Status: AC
  Filled 2015-02-01: qty 10

## 2015-02-01 MED ORDER — MAGNESIUM SULFATE 4 GM/100ML IV SOLN
4.0000 g | Freq: Once | INTRAVENOUS | Status: AC
  Administered 2015-02-01: 4 g via INTRAVENOUS
  Filled 2015-02-01: qty 100

## 2015-02-01 MED ORDER — ALBUMIN HUMAN 5 % IV SOLN
INTRAVENOUS | Status: DC | PRN
  Administered 2015-02-01 (×2): via INTRAVENOUS

## 2015-02-01 MED ORDER — VECURONIUM BROMIDE 10 MG IV SOLR
INTRAVENOUS | Status: AC
  Filled 2015-02-01: qty 20

## 2015-02-01 MED ORDER — PHENYLEPHRINE HCL 10 MG/ML IJ SOLN: 0.0000 ug/min | INTRAMUSCULAR | Status: DC

## 2015-02-01 MED ORDER — PANTOPRAZOLE SODIUM 40 MG PO TBEC
40.0000 mg | DELAYED_RELEASE_TABLET | Freq: Every day | ORAL | Status: DC
  Administered 2015-02-03 – 2015-02-15 (×12): 40 mg via ORAL
  Filled 2015-02-01 (×13): qty 1

## 2015-02-01 MED ORDER — LACTATED RINGERS IV SOLN: INTRAVENOUS | Status: DC

## 2015-02-01 MED ORDER — VANCOMYCIN HCL IN DEXTROSE 1-5 GM/200ML-% IV SOLN
1000.0000 mg | Freq: Once | INTRAVENOUS | Status: AC
  Administered 2015-02-01: 1000 mg via INTRAVENOUS
  Filled 2015-02-01 (×2): qty 200

## 2015-02-01 MED ORDER — CHLORHEXIDINE GLUCONATE 0.12% ORAL RINSE (MEDLINE KIT)
15.0000 mL | Freq: Two times a day (BID) | OROMUCOSAL | Status: DC
  Administered 2015-02-01 – 2015-02-02 (×2): 15 mL via OROMUCOSAL

## 2015-02-01 MED ORDER — INSULIN REGULAR BOLUS VIA INFUSION
0.0000 [IU] | Freq: Three times a day (TID) | INTRAVENOUS | Status: DC
  Filled 2015-02-01: qty 10

## 2015-02-01 MED ORDER — METOPROLOL TARTRATE 1 MG/ML IV SOLN
2.5000 mg | INTRAVENOUS | Status: DC | PRN
  Administered 2015-02-02: 10 mg via INTRAVENOUS
  Filled 2015-02-01 (×2): qty 5

## 2015-02-01 MED ORDER — LACTATED RINGERS IV SOLN: 500.0000 mL | Freq: Once | INTRAVENOUS | Status: DC | PRN

## 2015-02-01 MED ORDER — ACETAMINOPHEN 160 MG/5ML PO SOLN: 650.0000 mg | Freq: Once | ORAL | Status: AC

## 2015-02-01 MED ORDER — DEXMEDETOMIDINE HCL IN NACL 200 MCG/50ML IV SOLN
0.0000 ug/kg/h | INTRAVENOUS | Status: DC
  Administered 2015-02-01: 0.7 ug/kg/h via INTRAVENOUS
  Filled 2015-02-01: qty 50

## 2015-02-01 MED ORDER — MILRINONE IN DEXTROSE 20 MG/100ML IV SOLN
0.0000 ug/kg/min | INTRAVENOUS | Status: DC
  Administered 2015-02-02: 0.2 ug/kg/min via INTRAVENOUS
  Filled 2015-02-01 (×2): qty 100

## 2015-02-01 MED ORDER — STERILE WATER FOR INJECTION IJ SOLN
INTRAMUSCULAR | Status: AC
  Filled 2015-02-01: qty 20

## 2015-02-01 MED ORDER — CALCIUM CHLORIDE 10 % IV SOLN
INTRAVENOUS | Status: DC | PRN
  Administered 2015-02-01 (×3): 200 mg via INTRAVENOUS

## 2015-02-01 MED ORDER — TRAMADOL HCL 50 MG PO TABS
50.0000 mg | ORAL_TABLET | ORAL | Status: DC | PRN
  Administered 2015-02-05 (×2): 100 mg via ORAL
  Administered 2015-02-07 (×3): 50 mg via ORAL
  Administered 2015-02-08: 100 mg via ORAL
  Administered 2015-02-08 – 2015-02-10 (×4): 50 mg via ORAL
  Administered 2015-02-11: 100 mg via ORAL
  Administered 2015-02-11: 50 mg via ORAL
  Administered 2015-02-12: 100 mg via ORAL
  Filled 2015-02-01 (×2): qty 1
  Filled 2015-02-01 (×6): qty 2
  Filled 2015-02-01 (×2): qty 1
  Filled 2015-02-01: qty 2
  Filled 2015-02-01: qty 1
  Filled 2015-02-01: qty 2

## 2015-02-01 MED ORDER — PHENYLEPHRINE 40 MCG/ML (10ML) SYRINGE FOR IV PUSH (FOR BLOOD PRESSURE SUPPORT)
PREFILLED_SYRINGE | INTRAVENOUS | Status: AC
  Filled 2015-02-01: qty 10

## 2015-02-01 MED ORDER — ETOMIDATE 2 MG/ML IV SOLN
INTRAVENOUS | Status: AC
  Filled 2015-02-01: qty 10

## 2015-02-01 MED ORDER — ALBUMIN HUMAN 5 % IV SOLN
250.0000 mL | INTRAVENOUS | Status: DC | PRN
  Administered 2015-02-01 (×2): 250 mL via INTRAVENOUS
  Filled 2015-02-01: qty 250

## 2015-02-01 MED ORDER — ROCURONIUM BROMIDE 100 MG/10ML IV SOLN
INTRAVENOUS | Status: DC | PRN
  Administered 2015-02-01 (×2): 50 mg via INTRAVENOUS

## 2015-02-01 MED ORDER — HEPARIN SODIUM (PORCINE) 1000 UNIT/ML IJ SOLN
INTRAMUSCULAR | Status: AC
  Filled 2015-02-01: qty 1

## 2015-02-01 MED ORDER — NITROGLYCERIN IN D5W 200-5 MCG/ML-% IV SOLN: 0.0000 ug/min | INTRAVENOUS | Status: DC

## 2015-02-01 MED ORDER — LACTATED RINGERS IV SOLN
INTRAVENOUS | Status: DC | PRN
  Administered 2015-02-01: 07:00:00 via INTRAVENOUS

## 2015-02-01 MED ORDER — FAMOTIDINE IN NACL 20-0.9 MG/50ML-% IV SOLN
20.0000 mg | Freq: Two times a day (BID) | INTRAVENOUS | Status: AC
  Administered 2015-02-01 (×2): 20 mg via INTRAVENOUS
  Filled 2015-02-01: qty 50

## 2015-02-01 MED ORDER — PLASMA-LYTE 148 IV SOLN
INTRAVENOUS | Status: AC
  Administered 2015-02-01: 500 mL
  Filled 2015-02-01: qty 2.5

## 2015-02-01 MED ORDER — EPHEDRINE SULFATE 50 MG/ML IJ SOLN
INTRAMUSCULAR | Status: AC
Start: 2015-02-01 — End: 2015-02-01
  Filled 2015-02-01: qty 2

## 2015-02-01 MED ORDER — BISACODYL 5 MG PO TBEC
10.0000 mg | DELAYED_RELEASE_TABLET | Freq: Every day | ORAL | Status: DC
  Administered 2015-02-02 – 2015-02-15 (×13): 10 mg via ORAL
  Filled 2015-02-01 (×13): qty 2

## 2015-02-01 MED ORDER — SODIUM CHLORIDE 0.9 % IJ SOLN: 3.0000 mL | INTRAMUSCULAR | Status: DC | PRN

## 2015-02-01 MED ORDER — DOPAMINE-DEXTROSE 3.2-5 MG/ML-% IV SOLN: 0.0000 ug/kg/min | INTRAVENOUS | Status: DC

## 2015-02-01 MED ORDER — DOCUSATE SODIUM 100 MG PO CAPS
200.0000 mg | ORAL_CAPSULE | Freq: Every day | ORAL | Status: DC
  Administered 2015-02-02 – 2015-02-15 (×12): 200 mg via ORAL
  Filled 2015-02-01 (×13): qty 2

## 2015-02-01 MED ORDER — FENTANYL CITRATE (PF) 100 MCG/2ML IJ SOLN
INTRAMUSCULAR | Status: DC | PRN
  Administered 2015-02-01 (×2): 100 ug via INTRAVENOUS
  Administered 2015-02-01: 300 ug via INTRAVENOUS
  Administered 2015-02-01: 50 ug via INTRAVENOUS
  Administered 2015-02-01: 150 ug via INTRAVENOUS

## 2015-02-01 MED ORDER — OXYCODONE HCL 5 MG PO TABS
5.0000 mg | ORAL_TABLET | ORAL | Status: DC | PRN
  Administered 2015-02-02 – 2015-02-04 (×9): 10 mg via ORAL
  Administered 2015-02-06: 5 mg via ORAL
  Filled 2015-02-01 (×3): qty 2
  Filled 2015-02-01: qty 1
  Filled 2015-02-01 (×8): qty 2

## 2015-02-01 MED ORDER — VECURONIUM BROMIDE 10 MG IV SOLR
INTRAVENOUS | Status: DC | PRN
  Administered 2015-02-01: 2 mg via INTRAVENOUS
  Administered 2015-02-01: 1 mg via INTRAVENOUS
  Administered 2015-02-01: 2 mg via INTRAVENOUS
  Administered 2015-02-01 (×3): 5 mg via INTRAVENOUS

## 2015-02-01 MED ORDER — METOPROLOL TARTRATE 12.5 MG HALF TABLET
12.5000 mg | ORAL_TABLET | Freq: Two times a day (BID) | ORAL | Status: DC
  Administered 2015-02-03 – 2015-02-04 (×2): 12.5 mg via ORAL
  Filled 2015-02-01 (×3): qty 1

## 2015-02-01 MED ORDER — MIDAZOLAM HCL 10 MG/2ML IJ SOLN
INTRAMUSCULAR | Status: AC
  Filled 2015-02-01: qty 2

## 2015-02-01 MED ORDER — NOREPINEPHRINE BITARTRATE 1 MG/ML IV SOLN
0.0000 ug/min | INTRAVENOUS | Status: AC
  Administered 2015-02-01: 2 ug/min via INTRAVENOUS
  Filled 2015-02-01: qty 4

## 2015-02-01 MED ORDER — HEPARIN SODIUM (PORCINE) 1000 UNIT/ML IJ SOLN
INTRAMUSCULAR | Status: DC | PRN
  Administered 2015-02-01: 37000 [IU] via INTRAVENOUS
  Administered 2015-02-01: 3000 [IU] via INTRAVENOUS

## 2015-02-01 MED ORDER — 0.9 % SODIUM CHLORIDE (POUR BTL) OPTIME
TOPICAL | Status: DC | PRN
  Administered 2015-02-01: 1000 mL

## 2015-02-01 MED ORDER — EPINEPHRINE HCL 1 MG/ML IJ SOLN
1.0000 ug/min | INTRAVENOUS | Status: DC
  Filled 2015-02-01: qty 4

## 2015-02-01 MED ORDER — MORPHINE SULFATE (PF) 2 MG/ML IV SOLN
2.0000 mg | INTRAVENOUS | Status: DC | PRN
  Administered 2015-02-01 – 2015-02-02 (×2): 2 mg via INTRAVENOUS
  Administered 2015-02-02: 4 mg via INTRAVENOUS
  Filled 2015-02-01: qty 2
  Filled 2015-02-01 (×2): qty 1
  Filled 2015-02-01: qty 2

## 2015-02-01 MED ORDER — SODIUM CHLORIDE 0.9 % IJ SOLN
3.0000 mL | Freq: Two times a day (BID) | INTRAMUSCULAR | Status: DC
  Administered 2015-02-02 – 2015-02-03 (×3): 3 mL via INTRAVENOUS
  Administered 2015-02-04: 10 mL via INTRAVENOUS
  Administered 2015-02-08 – 2015-02-11 (×4): 3 mL via INTRAVENOUS

## 2015-02-01 MED ORDER — ASPIRIN EC 325 MG PO TBEC
325.0000 mg | DELAYED_RELEASE_TABLET | Freq: Every day | ORAL | Status: DC
  Administered 2015-02-03 – 2015-02-14 (×12): 325 mg via ORAL
  Filled 2015-02-01 (×12): qty 1

## 2015-02-01 MED ORDER — ANTISEPTIC ORAL RINSE SOLUTION (CORINZ)
7.0000 mL | Freq: Four times a day (QID) | OROMUCOSAL | Status: DC
  Administered 2015-02-02 (×2): 7 mL via OROMUCOSAL

## 2015-02-01 MED ORDER — ACETAMINOPHEN 160 MG/5ML PO SOLN: 1000.0000 mg | Freq: Four times a day (QID) | ORAL | Status: DC

## 2015-02-01 MED ORDER — SODIUM CHLORIDE 0.45 % IV SOLN
INTRAVENOUS | Status: DC | PRN
Start: 2015-02-01 — End: 2015-02-02

## 2015-02-01 MED ORDER — BISACODYL 10 MG RE SUPP: 10.0000 mg | Freq: Every day | RECTAL | Status: DC

## 2015-02-01 MED ORDER — ARTIFICIAL TEARS OP OINT
TOPICAL_OINTMENT | OPHTHALMIC | Status: AC
  Filled 2015-02-01: qty 3.5

## 2015-02-01 MED ORDER — ASPIRIN 81 MG PO CHEW: 324.0000 mg | CHEWABLE_TABLET | Freq: Every day | ORAL | Status: DC

## 2015-02-01 MED ORDER — SODIUM CHLORIDE 0.9 % IV SOLN: INTRAVENOUS | Status: DC

## 2015-02-01 MED ORDER — FENTANYL CITRATE (PF) 250 MCG/5ML IJ SOLN
INTRAMUSCULAR | Status: AC
  Filled 2015-02-01: qty 30

## 2015-02-01 MED ORDER — NOREPINEPHRINE BITARTRATE 1 MG/ML IV SOLN
1.0000 ug/min | INTRAVENOUS | Status: DC
  Filled 2015-02-01: qty 4

## 2015-02-01 MED ORDER — CHLORHEXIDINE GLUCONATE 0.12 % MT SOLN
15.0000 mL | OROMUCOSAL | Status: AC
  Administered 2015-02-01: 15 mL via OROMUCOSAL

## 2015-02-01 MED ORDER — MIDAZOLAM HCL 2 MG/2ML IJ SOLN
2.0000 mg | INTRAMUSCULAR | Status: DC | PRN
  Filled 2015-02-01: qty 2

## 2015-02-01 MED ORDER — PROTAMINE SULFATE 10 MG/ML IV SOLN
INTRAVENOUS | Status: AC
  Filled 2015-02-01: qty 25

## 2015-02-01 MED ORDER — ACETAMINOPHEN 500 MG PO TABS
1000.0000 mg | ORAL_TABLET | Freq: Four times a day (QID) | ORAL | Status: AC
  Administered 2015-02-02 – 2015-02-06 (×10): 1000 mg via ORAL
  Filled 2015-02-01 (×10): qty 2

## 2015-02-01 MED ORDER — LACTATED RINGERS IV SOLN
INTRAVENOUS | Status: DC | PRN
  Administered 2015-02-01 (×2): via INTRAVENOUS

## 2015-02-01 MED ORDER — DEXTROSE 5 % IV SOLN
1.5000 g | Freq: Two times a day (BID) | INTRAVENOUS | Status: AC
  Administered 2015-02-01 – 2015-02-03 (×4): 1.5 g via INTRAVENOUS
  Filled 2015-02-01 (×5): qty 1.5

## 2015-02-01 MED ORDER — ARTIFICIAL TEARS OP OINT
TOPICAL_OINTMENT | OPHTHALMIC | Status: DC | PRN
  Administered 2015-02-01: 1 via OPHTHALMIC

## 2015-02-01 MED ORDER — POTASSIUM CHLORIDE 10 MEQ/50ML IV SOLN: 10.0000 meq | INTRAVENOUS | Status: AC

## 2015-02-01 MED ORDER — MORPHINE SULFATE (PF) 2 MG/ML IV SOLN: 1.0000 mg | INTRAVENOUS | Status: DC | PRN

## 2015-02-01 MED ORDER — MIDAZOLAM HCL 5 MG/5ML IJ SOLN
INTRAMUSCULAR | Status: DC | PRN
  Administered 2015-02-01: 5 mg via INTRAVENOUS
  Administered 2015-02-01: 1 mg via INTRAVENOUS
  Administered 2015-02-01 (×2): 2 mg via INTRAVENOUS
  Administered 2015-02-01: 7 mg via INTRAVENOUS
  Administered 2015-02-01: 3 mg via INTRAVENOUS

## 2015-02-01 MED ORDER — ROCURONIUM BROMIDE 50 MG/5ML IV SOLN
INTRAVENOUS | Status: AC
  Filled 2015-02-01: qty 1

## 2015-02-01 MED ORDER — SODIUM CHLORIDE 0.9 % IV SOLN
INTRAVENOUS | Status: DC
  Administered 2015-02-01: 19:00:00 via INTRAVENOUS
  Filled 2015-02-01: qty 2.5

## 2015-02-01 MED ORDER — METOPROLOL TARTRATE 25 MG/10 ML ORAL SUSPENSION: 12.5000 mg | Freq: Two times a day (BID) | ORAL | Status: DC

## 2015-02-01 MED ORDER — PROTAMINE SULFATE 10 MG/ML IV SOLN
INTRAVENOUS | Status: DC | PRN
  Administered 2015-02-01: 350 mg via INTRAVENOUS

## 2015-02-01 MED ORDER — ETOMIDATE 2 MG/ML IV SOLN
INTRAVENOUS | Status: DC | PRN
  Administered 2015-02-01: 8 mg via INTRAVENOUS

## 2015-02-01 MED ORDER — ONDANSETRON HCL 4 MG/2ML IJ SOLN
4.0000 mg | Freq: Four times a day (QID) | INTRAMUSCULAR | Status: DC | PRN
  Administered 2015-02-03 – 2015-02-12 (×3): 4 mg via INTRAVENOUS
  Filled 2015-02-01 (×3): qty 2

## 2015-02-01 MED ORDER — SODIUM CHLORIDE 0.9 % IJ SOLN
INTRAMUSCULAR | Status: AC
  Filled 2015-02-01: qty 10

## 2015-02-01 MED ORDER — SODIUM CHLORIDE 0.9 % IV SOLN
250.0000 mL | INTRAVENOUS | Status: DC
  Administered 2015-02-02: 250 mL via INTRAVENOUS

## 2015-02-01 MED ORDER — ACETAMINOPHEN 650 MG RE SUPP
650.0000 mg | Freq: Once | RECTAL | Status: AC
  Administered 2015-02-01: 650 mg via RECTAL

## 2015-02-01 SURGICAL SUPPLY — 101 items
ADAPTER CARDIO PERF ANTE/RETRO (ADAPTER) ×4 IMPLANT
ADPR PRFSN 84XANTGRD RTRGD (ADAPTER) ×2
BAG DECANTER FOR FLEXI CONT (MISCELLANEOUS) ×4 IMPLANT
BANDAGE ACE 4X5 VEL STRL LF (GAUZE/BANDAGES/DRESSINGS) ×2 IMPLANT
BANDAGE ACE 6X5 VEL STRL LF (GAUZE/BANDAGES/DRESSINGS) ×2 IMPLANT
BANDAGE ELASTIC 4 VELCRO ST LF (GAUZE/BANDAGES/DRESSINGS) ×2 IMPLANT
BANDAGE ELASTIC 6 VELCRO ST LF (GAUZE/BANDAGES/DRESSINGS) ×2 IMPLANT
BASKET HEART  (ORDER IN 25'S) (MISCELLANEOUS) ×1
BASKET HEART (ORDER IN 25'S) (MISCELLANEOUS) ×1
BASKET HEART (ORDER IN 25S) (MISCELLANEOUS) ×2 IMPLANT
BLADE STERNUM SYSTEM 6 (BLADE) ×4 IMPLANT
BLADE SURG 12 STRL SS (BLADE) ×4 IMPLANT
BLADE SURG ROTATE 9660 (MISCELLANEOUS) ×2 IMPLANT
BNDG GAUZE ELAST 4 BULKY (GAUZE/BANDAGES/DRESSINGS) ×4 IMPLANT
CANISTER SUCTION 2500CC (MISCELLANEOUS) ×4 IMPLANT
CANNULA ARTERIAL NVNT 3/8 22FR (MISCELLANEOUS) ×2 IMPLANT
CANNULA GUNDRY RCSP 15FR (MISCELLANEOUS) ×4 IMPLANT
CATH CPB KIT VANTRIGT (MISCELLANEOUS) ×4 IMPLANT
CATH ROBINSON RED A/P 18FR (CATHETERS) ×12 IMPLANT
CATH THORACIC 36FR RT ANG (CATHETERS) ×4 IMPLANT
CLIP RETRACTION 3.0MM CORONARY (MISCELLANEOUS) ×2 IMPLANT
COVER SURGICAL LIGHT HANDLE (MISCELLANEOUS) ×4 IMPLANT
CRADLE DONUT ADULT HEAD (MISCELLANEOUS) ×4 IMPLANT
DRAIN CHANNEL 32F RND 10.7 FF (WOUND CARE) ×4 IMPLANT
DRAPE CARDIOVASCULAR INCISE (DRAPES) ×4
DRAPE SLUSH/WARMER DISC (DRAPES) ×4 IMPLANT
DRAPE SRG 135X102X78XABS (DRAPES) ×2 IMPLANT
DRSG AQUACEL AG ADV 3.5X14 (GAUZE/BANDAGES/DRESSINGS) ×4 IMPLANT
ELECT BLADE 4.0 EZ CLEAN MEGAD (MISCELLANEOUS) ×4
ELECT BLADE 6.5 EXT (BLADE) ×4 IMPLANT
ELECT CAUTERY BLADE 6.4 (BLADE) ×4 IMPLANT
ELECT REM PT RETURN 9FT ADLT (ELECTROSURGICAL) ×8
ELECTRODE BLDE 4.0 EZ CLN MEGD (MISCELLANEOUS) ×2 IMPLANT
ELECTRODE REM PT RTRN 9FT ADLT (ELECTROSURGICAL) ×4 IMPLANT
GAUZE SPONGE 4X4 12PLY STRL (GAUZE/BANDAGES/DRESSINGS) ×8 IMPLANT
GLOVE BIO SURGEON STRL SZ7.5 (GLOVE) ×12 IMPLANT
GOWN STRL REUS W/ TWL LRG LVL3 (GOWN DISPOSABLE) ×8 IMPLANT
GOWN STRL REUS W/TWL LRG LVL3 (GOWN DISPOSABLE) ×24
HEMOSTAT POWDER SURGIFOAM 1G (HEMOSTASIS) ×12 IMPLANT
HEMOSTAT SURGICEL 2X14 (HEMOSTASIS) ×4 IMPLANT
INSERT FOGARTY XLG (MISCELLANEOUS) IMPLANT
KIT BASIN OR (CUSTOM PROCEDURE TRAY) ×4 IMPLANT
KIT ROOM TURNOVER OR (KITS) ×4 IMPLANT
KIT SUCTION CATH 14FR (SUCTIONS) ×4 IMPLANT
KIT VASOVIEW W/TROCAR VH 2000 (KITS) ×4 IMPLANT
LEAD PACING MYOCARDI (MISCELLANEOUS) ×4 IMPLANT
MARKER GRAFT CORONARY BYPASS (MISCELLANEOUS) ×12 IMPLANT
NS IRRIG 1000ML POUR BTL (IV SOLUTION) ×20 IMPLANT
PACK OPEN HEART (CUSTOM PROCEDURE TRAY) ×4 IMPLANT
PAD ARMBOARD 7.5X6 YLW CONV (MISCELLANEOUS) ×10 IMPLANT
PAD ELECT DEFIB RADIOL ZOLL (MISCELLANEOUS) ×4 IMPLANT
PENCIL BUTTON HOLSTER BLD 10FT (ELECTRODE) ×4 IMPLANT
PUNCH AORTIC ROTATE 4.0MM (MISCELLANEOUS) IMPLANT
PUNCH AORTIC ROTATE 4.5MM 8IN (MISCELLANEOUS) IMPLANT
PUNCH AORTIC ROTATE 5MM 8IN (MISCELLANEOUS) IMPLANT
SET CARDIOPLEGIA MPS 5001102 (MISCELLANEOUS) ×2 IMPLANT
SPONGE GAUZE 4X4 12PLY STER LF (GAUZE/BANDAGES/DRESSINGS) ×4 IMPLANT
SPONGE LAP 18X18 X RAY DECT (DISPOSABLE) ×6 IMPLANT
SPONGE LAP 4X18 X RAY DECT (DISPOSABLE) ×2 IMPLANT
STOPCOCK 4 WAY LG BORE MALE ST (IV SETS) ×2 IMPLANT
SURGIFLO W/THROMBIN 8M KIT (HEMOSTASIS) ×4 IMPLANT
SUT BONE WAX W31G (SUTURE) ×4 IMPLANT
SUT MNCRL AB 4-0 PS2 18 (SUTURE) IMPLANT
SUT PROLENE 3 0 SH DA (SUTURE) IMPLANT
SUT PROLENE 3 0 SH1 36 (SUTURE) IMPLANT
SUT PROLENE 4 0 RB 1 (SUTURE) ×4
SUT PROLENE 4 0 SH DA (SUTURE) ×4 IMPLANT
SUT PROLENE 4-0 RB1 .5 CRCL 36 (SUTURE) ×2 IMPLANT
SUT PROLENE 5 0 C 1 36 (SUTURE) ×4 IMPLANT
SUT PROLENE 6 0 C 1 30 (SUTURE) ×4 IMPLANT
SUT PROLENE 6 0 CC (SUTURE) ×12 IMPLANT
SUT PROLENE 8 0 BV175 6 (SUTURE) IMPLANT
SUT PROLENE BLUE 7 0 (SUTURE) ×4 IMPLANT
SUT SILK  1 MH (SUTURE) ×6
SUT SILK 1 MH (SUTURE) IMPLANT
SUT SILK 1 TIES 10X30 (SUTURE) ×2 IMPLANT
SUT SILK 2 0 SH CR/8 (SUTURE) ×6 IMPLANT
SUT SILK 2 0 TIES 10X30 (SUTURE) ×2 IMPLANT
SUT SILK 2 0 TIES 17X18 (SUTURE) ×4
SUT SILK 2-0 18XBRD TIE BLK (SUTURE) IMPLANT
SUT SILK 3 0 SH CR/8 (SUTURE) ×2 IMPLANT
SUT SILK 4 0 TIE 10X30 (SUTURE) ×2 IMPLANT
SUT STEEL 6MS V (SUTURE) ×8 IMPLANT
SUT STEEL SZ 6 DBL 3X14 BALL (SUTURE) ×4 IMPLANT
SUT VIC AB 1 CTX 36 (SUTURE) ×8
SUT VIC AB 1 CTX36XBRD ANBCTR (SUTURE) ×4 IMPLANT
SUT VIC AB 2-0 CT1 27 (SUTURE)
SUT VIC AB 2-0 CT1 TAPERPNT 27 (SUTURE) IMPLANT
SUT VIC AB 2-0 CTX 27 (SUTURE) ×4 IMPLANT
SUT VIC AB 3-0 X1 27 (SUTURE) ×4 IMPLANT
SUTURE E-PAK OPEN HEART (SUTURE) ×4 IMPLANT
SYSTEM SAHARA CHEST DRAIN ATS (WOUND CARE) ×4 IMPLANT
TAPE CLOTH SURG 4X10 WHT LF (GAUZE/BANDAGES/DRESSINGS) ×4 IMPLANT
TOWEL OR 17X24 6PK STRL BLUE (TOWEL DISPOSABLE) ×8 IMPLANT
TOWEL OR 17X26 10 PK STRL BLUE (TOWEL DISPOSABLE) ×8 IMPLANT
TRAY CATH LUMEN 1 20CM STRL (SET/KITS/TRAYS/PACK) ×2 IMPLANT
TRAY FOLEY IC TEMP SENS 16FR (CATHETERS) ×4 IMPLANT
TUBING ART PRESS 48 MALE/FEM (TUBING) ×4 IMPLANT
TUBING INSUFFLATION (TUBING) ×4 IMPLANT
UNDERPAD 30X30 INCONTINENT (UNDERPADS AND DIAPERS) ×4 IMPLANT
WATER STERILE IRR 1000ML POUR (IV SOLUTION) ×8 IMPLANT

## 2015-02-01 NOTE — Anesthesia Procedure Notes (Addendum)
Central Venous Catheter Insertion Performed by: anesthesiologist 02/01/2015 7:05 AM Patient location: Pre-op. Preanesthetic checklist: patient identified, IV checked, site marked, risks and benefits discussed, surgical consent, monitors and equipment checked, pre-op evaluation, timeout performed and anesthesia consent Position: Trendelenburg Lidocaine 1% used for infiltration Landmarks identified Catheter size: 9 Fr Central line was placed.Double lumen Procedure performed using ultrasound guided technique. Attempts: 1 Following insertion, dressing applied, line sutured and Biopatch. Post procedure assessment: blood return through all ports. Patient tolerated the procedure well with no immediate complications.   Procedure Name: Intubation Performed by: Everlene BallsHAYES, Mazal Ebey T Pre-anesthesia Checklist: Patient identified, Timeout performed, Emergency Drugs available, Suction available and Patient being monitored Patient Re-evaluated:Patient Re-evaluated prior to inductionOxygen Delivery Method: Circle system utilized Preoxygenation: Pre-oxygenation with 100% oxygen Intubation Type: IV induction Ventilation: Mask ventilation without difficulty and Oral airway inserted - appropriate to patient size Laryngoscope Size: Mac and 3 Grade View: Grade II Tube type: Oral Tube size: 8.0 mm Number of attempts: 1 Airway Equipment and Method: Stylet Placement Confirmation: ETT inserted through vocal cords under direct vision,  breath sounds checked- equal and bilateral and positive ETCO2 Secured at: 23 cm Tube secured with: Tape Dental Injury: Teeth and Oropharynx as per pre-operative assessment

## 2015-02-01 NOTE — Progress Notes (Signed)
  Echocardiogram 2D Echocardiogram has been performed.  Leta JunglingCooper, Eh Sesay M 02/01/2015, 8:48 AM

## 2015-02-01 NOTE — Brief Op Note (Signed)
01/23/2015 - 02/01/2015  11:44 AM      [address]       [place] [id number]             [phone]     01/23/2015 - 02/01/2015  11:44 AM  PATIENT:  Bradley Benitez  65 y.o. male  PRE-OPERATIVE DIAGNOSIS:  CAD  POST-OPERATIVE DIAGNOSIS:  Coronary artery disease  PROCEDURE:  Procedure(s): CORONARY ARTERY BYPASS GRAFTING (CABG)x  4  using left internal mammary artery and right greater saphenous leg vein using endoscope ([facility]). (LIMA-LAD; SVG-RAMUS; SVG-PD; SVG-OM) TRANSESOPHAGEAL ECHOCARDIOGRAM (TEE)  SURGEON:  Surgeon(s): Kerin PernaPeter Van Trigt, MD  PHYSICIAN ASSISTANT: Gaberial Cada PA-C  ANESTHESIA:   general  PATIENT CONDITION:  ICU - intubated and hemodynamically stable.  PRE-OPERATIVE WEIGHT: 103kg  COMPLICATIONS: NO KNOWN  EBL: SEE ANEST/PERFUSION RECORD

## 2015-02-01 NOTE — Progress Notes (Signed)
Beginning Rapid Wean Protocol, patient is tolerating well and RT is monitoring at the bedside.

## 2015-02-01 NOTE — Transfer of Care (Signed)
Immediate Anesthesia Transfer of Care Note  Patient: Bradley Benitez  Procedure(s) Performed: Procedure(s): CORONARY ARTERY BYPASS GRAFTING (CABG)x 4 using left internal mammary artery and right greater saphenous leg vein using endoscope. (N/A) TRANSESOPHAGEAL ECHOCARDIOGRAM (TEE) (N/A)  CENTRIMAG VENTRICULAR ASSIST DEVICE, back up (N/A)  Patient Location: SICU  Anesthesia Type:General  Level of Consciousness: sedated and Patient remains intubated per anesthesia plan  Airway & Oxygen Therapy: Patient remains intubated per anesthesia plan and Patient placed on Ventilator (see vital sign flow sheet for setting)  Post-op Assessment: Report given to RN and Post -op Vital signs reviewed and stable  Post vital signs: Reviewed and stable  Last Vitals:  Filed Vitals:   01/31/15 2319 02/01/15 0441  BP:  99/69  Pulse:  88  Temp: 36.3 C 36.4 C  Resp:      Complications: No apparent anesthesia complications

## 2015-02-01 NOTE — Anesthesia Preprocedure Evaluation (Addendum)
Anesthesia Evaluation  Patient identified by MRN, date of birth, ID band Patient awake    Reviewed: Allergy & Precautions, NPO status , Patient's Chart, lab work & pertinent test results  Airway Mallampati: II  TM Distance: >3 FB Neck ROM: Full    Dental   Pulmonary former smoker,    breath sounds clear to auscultation + decreased breath sounds      Cardiovascular + CAD, + Past MI and +CHF   Rhythm:Regular Rate:Normal     Neuro/Psych  Neuromuscular disease    GI/Hepatic   Endo/Other  diabetes  Renal/GU Renal disease     Musculoskeletal   Abdominal Normal abdominal exam  (+)   Peds  Hematology   Anesthesia Other Findings   Reproductive/Obstetrics                            Anesthesia Physical Anesthesia Plan  ASA: IV  Anesthesia Plan: General   Post-op Pain Management:    Induction: Intravenous  Airway Management Planned: Oral ETT  Additional Equipment: Arterial line, CVP, PA Cath, TEE and Ultrasound Guidance Line Placement  Intra-op Plan:   Post-operative Plan: Post-operative intubation/ventilation  Informed Consent: I have reviewed the patients History and Physical, chart, labs and discussed the procedure including the risks, benefits and alternatives for the proposed anesthesia with the patient or authorized representative who has indicated his/her understanding and acceptance.   Dental advisory given  Plan Discussed with: Anesthesiologist, Surgeon and CRNA  Anesthesia Plan Comments:        Anesthesia Quick Evaluation

## 2015-02-01 NOTE — Procedures (Signed)
Extubation Procedure Note  Patient Details:   Name: Bradley PilaDavid M Preis DOB: 1950/10/11 MRN: [id number]   Airway Documentation:  Airway 8 mm (Active)  Secured at (cm) 23 cm 02/01/2015  8:36 PM  Measured From Lips 02/01/2015  8:36 PM  Secured Location Right 02/01/2015  8:36 PM  Secured By Pink Tape 02/01/2015  8:36 PM  Site Condition Dry 02/01/2015  8:36 PM    Evaluation  O2 sats: stable throughout Complications: No apparent complications Patient did tolerate procedure well. Bilateral Breath Sounds: Clear, Diminished   Yes  Azucena Freedlysha M Renne Platts 02/01/2015, 9:13 PM   Patient performed NIF of -25 and FVC of .7L, demonstrated understanding and showed ability to speak his name. IS placed at bedside and RN at bedside throughout. RT will continue to monitor.

## 2015-02-01 NOTE — Progress Notes (Signed)
TCTS BRIEF SICU PROGRESS NOTE  Day of Surgery  S/P Procedure(s) (LRB): CORONARY ARTERY BYPASS GRAFTING (CABG)x 4 using left internal mammary artery and right greater saphenous leg vein using endoscope. (N/A) TRANSESOPHAGEAL ECHOCARDIOGRAM (TEE) (N/A)  CENTRIMAG VENTRICULAR ASSIST DEVICE, back up (N/A)   Sedated on vent NSR w/ stable hemodynamics on low dose milrinone, Epi and Neo drips O2 sats 100% on 50% FiO2 Chest tube output low UOP adequate Labs okay w/ Hgb 8.9  Plan: Continue routine early postop  Bradley Nailslarence H Mela Perham, MD 02/01/2015 5:28 PM

## 2015-02-01 NOTE — Progress Notes (Signed)
The patient was examined and preop studies reviewed. There has been no change from the prior exam and the patient is ready for surgery.  Plan CABG , possible LVAD ( C-Mag) on D Furnish  This procedure has been fully reviewed with the patient and written informed consent has been oGayleen Orembtained.  In addition to other potential risks and complications from the surgery, I have made the patient aware of the recent [facility] Health Advisory concerning the risk of infection by Myocobacterium chimaera related to the use of Stockert 3T heater-cooler equipment during cardiac surgery. I discussed with the patient the low risk of infection, as well as our compliance with the most current FDA recommendations to minimize infection and testing of all devices for contamination. The patient has been made aware of the limited alternatives to immediately replacing the current equipment. The patient has been informed regarding the risks associated with waiting to proceed with needed surgery and that such risks are greater than the risk of infection related to the use of the heater-cooler device. I did make the patient aware that after careful review of the patients [facility] cardiac [facility] we have no evidence that heater/cooler related infections have occurred at [facility]. We discussed that this is a slow-growing bacterium, such that it can take some period of time for symptoms to develop.

## 2015-02-02 ENCOUNTER — Inpatient Hospital Stay (HOSPITAL_COMMUNITY): Payer: PPO

## 2015-02-02 LAB — CBC
HCT: 25.5 % — ABNORMAL LOW (ref 39.0–52.0)
HEMATOCRIT: 21.6 % — AB (ref 39.0–52.0)
Hemoglobin: 7.5 g/dL — ABNORMAL LOW (ref 13.0–17.0)
Hemoglobin: 8.7 g/dL — ABNORMAL LOW (ref 13.0–17.0)
MCH: 28.1 pg (ref 26.0–34.0)
MCH: 27.9 pg (ref 26.0–34.0)
MCHC: 34.7 g/dL (ref 30.0–36.0)
MCHC: 34.1 g/dL (ref 30.0–36.0)
MCV: 80.9 fL (ref 78.0–100.0)
MCV: 81.7 fL (ref 78.0–100.0)
PLATELETS: 116 10*3/uL — AB (ref 150–400)
Platelets: 118 10*3/uL — ABNORMAL LOW (ref 150–400)
RBC: 2.67 MIL/uL — ABNORMAL LOW (ref 4.22–5.81)
RBC: 3.12 MIL/uL — AB (ref 4.22–5.81)
RDW: 13.6 % (ref 11.5–15.5)
RDW: 13.5 % (ref 11.5–15.5)
WBC: 8.9 10*3/uL (ref 4.0–10.5)
WBC: 9.8 10*3/uL (ref 4.0–10.5)

## 2015-02-02 LAB — GLUCOSE, CAPILLARY
GLUCOSE-CAPILLARY: 101 mg/dL — AB (ref 65–99)
GLUCOSE-CAPILLARY: 103 mg/dL — AB (ref 65–99)
GLUCOSE-CAPILLARY: 105 mg/dL — AB (ref 65–99)
GLUCOSE-CAPILLARY: 108 mg/dL — AB (ref 65–99)
GLUCOSE-CAPILLARY: 123 mg/dL — AB (ref 65–99)
GLUCOSE-CAPILLARY: 162 mg/dL — AB (ref 65–99)
GLUCOSE-CAPILLARY: 95 mg/dL (ref 65–99)
GLUCOSE-CAPILLARY: 95 mg/dL (ref 65–99)
GLUCOSE-CAPILLARY: 95 mg/dL (ref 65–99)
Glucose-Capillary: 109 mg/dL — ABNORMAL HIGH (ref 65–99)
Glucose-Capillary: 102 mg/dL — ABNORMAL HIGH (ref 65–99)
Glucose-Capillary: 89 mg/dL (ref 65–99)
Glucose-Capillary: 101 mg/dL — ABNORMAL HIGH (ref 65–99)
Glucose-Capillary: 102 mg/dL — ABNORMAL HIGH (ref 65–99)
Glucose-Capillary: 85 mg/dL (ref 65–99)
Glucose-Capillary: 217 mg/dL — ABNORMAL HIGH (ref 65–99)
Glucose-Capillary: 130 mg/dL — ABNORMAL HIGH (ref 65–99)
Glucose-Capillary: 111 mg/dL — ABNORMAL HIGH (ref 65–99)

## 2015-02-02 LAB — BASIC METABOLIC PANEL
ANION GAP: 5 (ref 5–15)
BUN: 15 mg/dL (ref 6–20)
CALCIUM: 8.1 mg/dL — AB (ref 8.9–10.3)
CO2: 24 mmol/L (ref 22–32)
Chloride: 110 mmol/L (ref 101–111)
Creatinine, Ser: 1.21 mg/dL (ref 0.61–1.24)
GLUCOSE: 106 mg/dL — AB (ref 65–99)
POTASSIUM: 4.5 mmol/L (ref 3.5–5.1)
SODIUM: 139 mmol/L (ref 135–145)

## 2015-02-02 LAB — POCT I-STAT, CHEM 8
BUN: 19 mg/dL (ref 6–20)
CHLORIDE: 106 mmol/L (ref 101–111)
CREATININE: 1.4 mg/dL — AB (ref 0.61–1.24)
Calcium, Ion: 1.12 mmol/L — ABNORMAL LOW (ref 1.13–1.30)
Glucose, Bld: 111 mg/dL — ABNORMAL HIGH (ref 65–99)
HEMATOCRIT: 24 % — AB (ref 39.0–52.0)
Hemoglobin: 8.2 g/dL — ABNORMAL LOW (ref 13.0–17.0)
Potassium: 4.2 mmol/L (ref 3.5–5.1)
Sodium: 136 mmol/L (ref 135–145)
TCO2: 22 mmol/L (ref 0–100)

## 2015-02-02 LAB — BLOOD PRODUCT ORDER (VERBAL) VERIFICATION

## 2015-02-02 LAB — CREATININE, SERUM
CREATININE: 1.54 mg/dL — AB (ref 0.61–1.24)
GFR calc non Af Amer: 45 mL/min — ABNORMAL LOW (ref >60)
GFR, EST AFRICAN AMERICAN: 53 mL/min — AB (ref >60)

## 2015-02-02 LAB — MAGNESIUM
MAGNESIUM: 2.6 mg/dL — AB (ref 1.7–2.4)
MAGNESIUM: 2.4 mg/dL (ref 1.7–2.4)

## 2015-02-02 LAB — PREPARE FRESH FROZEN PLASMA
Unit division: 0
Unit division: 0

## 2015-02-02 MED ORDER — INSULIN DETEMIR 100 UNIT/ML ~~LOC~~ SOLN
20.0000 [IU] | Freq: Once | SUBCUTANEOUS | Status: AC
  Administered 2015-02-02: 20 [IU] via SUBCUTANEOUS
  Filled 2015-02-02: qty 0.2

## 2015-02-02 MED ORDER — FUROSEMIDE 10 MG/ML IJ SOLN
20.0000 mg | Freq: Four times a day (QID) | INTRAMUSCULAR | Status: AC
  Administered 2015-02-02 (×3): 20 mg via INTRAVENOUS
  Filled 2015-02-02: qty 2

## 2015-02-02 MED ORDER — MORPHINE SULFATE (PF) 2 MG/ML IV SOLN
2.0000 mg | INTRAVENOUS | Status: DC | PRN
  Administered 2015-02-02: 2 mg via INTRAVENOUS
  Filled 2015-02-02: qty 1

## 2015-02-02 MED ORDER — INSULIN ASPART 100 UNIT/ML ~~LOC~~ SOLN
0.0000 [IU] | SUBCUTANEOUS | Status: DC
  Administered 2015-02-03: 2 [IU] via SUBCUTANEOUS

## 2015-02-02 MED ORDER — INSULIN DETEMIR 100 UNIT/ML ~~LOC~~ SOLN
20.0000 [IU] | Freq: Every day | SUBCUTANEOUS | Status: DC
  Administered 2015-02-03 – 2015-02-15 (×13): 20 [IU] via SUBCUTANEOUS
  Filled 2015-02-02 (×14): qty 0.2

## 2015-02-02 NOTE — Anesthesia Postprocedure Evaluation (Signed)
Anesthesia Post Note  Patient: Bradley PilaDavid M Biegel  Procedure(s) Performed: Procedure(s) (LRB): CORONARY ARTERY BYPASS GRAFTING (CABG)x 4 using left internal mammary artery and right greater saphenous leg vein using endoscope. (N/A) TRANSESOPHAGEAL ECHOCARDIOGRAM (TEE) (N/A)  CENTRIMAG VENTRICULAR ASSIST DEVICE, back up (N/A)  Patient location during evaluation: PACU Anesthesia Type: General Level of consciousness: awake Pain management: pain level controlled Vital Signs Assessment: post-procedure vital signs reviewed and stable Respiratory status: spontaneous breathing Cardiovascular status: stable Anesthetic complications: no    Last Vitals:  Filed Vitals:   02/02/15 0900 02/02/15 0912  BP: 66/55 74/58  Pulse: 89 90  Temp: 37.4 C 37.4 C  Resp: 29 24    Last Pain:  Filed Vitals:   02/02/15 0945  PainSc: 4                  EDWARDS,Nikayla Madaris

## 2015-02-02 NOTE — Progress Notes (Addendum)
[address]       [name] [address]             [phone]        CARDIOTHORACIC SURGERY PROGRESS NOTE   R1 Day Post-Op Procedure(s) (LRB): CORONARY ARTERY BYPASS GRAFTING (CABG)x 4 using left internal mammary artery and right greater saphenous leg vein using endoscope. (N/A) TRANSESOPHAGEAL ECHOCARDIOGRAM (TEE) (N/A)  CENTRIMAG VENTRICULAR ASSIST DEVICE, back up (N/A)  Subjective: Sudden onset severe SSCP associated with severe hypertension this morning, resolved w/ IV metoprolol.  Otherwise reportedly had a good night.  Objective: Vital signs: BP Readings from Last 1 Encounters:  02/02/15 88/58   Pulse Readings from Last 1 Encounters:  02/02/15 91   Resp Readings from Last 1 Encounters:  02/02/15 22   Temp Readings from Last 1 Encounters:  02/02/15 98.8 F (37.1 C)     Hemodynamics: PAP: (23-38)/(8-21) 25/11 mmHg CO:  [3.5 L/min-8.8 L/min] 8.8 L/min CI:  [1.5 L/min/m2-3.9 L/min/m2] 3.9 L/min/m2  Physical Exam:  Rhythm:   sinus  Breath sounds: clear  Heart sounds:  RRR  Incisions:  Dressing dry, intact  Abdomen:  Soft, non-distended, non-tender  Extremities:  Warm, well-perfused  Chest tubes:  Low volume thin serosanguinous output, no air leak    Intake/Output from previous day: 01/06 0701 - 01/07 0700 In: 6768.3 [I.V.:4435.3; BJYNW:295; IV Piggyback:1450] Out: 3270 [Urine:1870; Blood:1050; Chest Tube:350] Intake/Output this shift:    Lab Results:  CBC: Recent Labs  02/01/15 2125 02/02/15 0446  WBC 10.2 8.9  HGB 8.6* 7.5*  HCT 23.8* 21.6*  PLT 150 118*    BMET:  Recent Labs  02/01/15 0445  02/01/15 2110 02/01/15 2125 02/02/15 0446  NA 139  < > 138  --  139  K 3.7  < > 4.3  --  4.5  CL 103  < > 104  --  110  CO2 26  --   --   --  24  GLUCOSE 139*  < > 109*  --  106*  BUN 21*  < > 16  --  15  CREATININE 1.27*  < > 1.00 1.10 1.21  CALCIUM 8.6*  --   --   --  8.1*  < > = values in this interval not displayed.     PT/INR:   Recent Labs  02/01/15 1500  LABPROT 17.6*  INR 1.44    CBG (last 3)   Recent Labs  02/02/15 0123 02/02/15 0227 02/02/15 0414  GLUCAP 89 101* 101*    ABG    Component Value Date/Time   PHART 7.417 02/01/2015 2212   PCO2ART 37.9 02/01/2015 2212   PO2ART 84.0 02/01/2015 2212   HCO3 24.3* 02/01/2015 2212   TCO2 25 02/01/2015 2212   ACIDBASEDEF 0.6 01/31/2015 2040   O2SAT 96.0 02/01/2015 2212    CXR: Looks good.  Mild bibasilar atelectasis and pulm vasc congestion  EKG: NSR - sinus tach w/out acute ischemic changes    Assessment/Plan: S/P Procedure(s) (LRB): CORONARY ARTERY BYPASS GRAFTING (CABG)x 4 using left internal mammary artery and right greater saphenous leg vein using endoscope. (N/A) TRANSESOPHAGEAL ECHOCARDIOGRAM (TEE) (N/A)  CENTRIMAG VENTRICULAR ASSIST DEVICE, back up (N/A)  Overall stable POD1 Maintaining NSR w/ stable hemodynamics on low dose milrinone, off Epi and Levophed Transient episode severe SSCP associated w/ severe hypertension, resolved following IV lopressor - EKG w/out acute ischemic changes Expected post op acute blood loss anemia, Hgb down to 7.5 Chronic combined systolic and  diastolic CHF with expected post-op volume excess Expected post op atelectasis, mild Type II diabetes mellitus, excellent glycemic control   Transfuse 1 unit PRBCs  Wean milrinone slowly  D/C chest tubes  Mobilize  Watch BP closely  Add levemir and wean insulin drip  Keep in SICU   Purcell Nailslarence H Penne Rosenstock, MD 02/02/2015 8:51 AM

## 2015-02-02 NOTE — Op Note (Signed)
Bradley Benitez, Bradley Benitez.:  [id number]  MEDICAL RECORD NO.:  [id number]  LOCATION:  2S02C                        FACILITY:  [facility]  PHYSICIAN:  Bradley Benitez, M.D.  DATE OF BIRTH:  1951/01/19  DATE OF PROCEDURE:  02/01/2015 DATE OF DISCHARGE:                              OPERATIVE REPORT   OPERATIONS: 1. Coronary artery bypass grafting x4 (left internal mammary artery to     left anterior descending artery, saphenous vein graft to ramus     intermedius, saphenous vein graft to obtuse marginal, saphenous     vein graft to distal right coronary artery). 2. Endoscopic harvest of right leg greater saphenous vein.  SURGEON:  Bradley Benitez, M.D.  ASSISTANT:  Bradley Benitez, P.A.-C.  PREOPERATIVE DIAGNOSES:  Severe three-vessel coronary artery disease, poorly-controlled diabetes, ischemic cardiomyopathy with non-ST- elevation myocardial infarction and acute pulmonary edema, evidence of viable myocardium in the inferior and lateral walls by cardiac MRI.  POSTOPERATIVE DIAGNOSES:  Severe three-vessel coronary artery disease, poorly-controlled diabetes, ischemic cardiomyopathy with non-ST- elevation myocardial infarction and acute pulmonary edema, evidence of viable myocardium in the inferior and lateral walls by cardiac MRI.  ANESTHESIA:  General by Dr. Judie Petit.  INDICATIONS:  The patient is a 65 year old Caucasian male, noncontrolled diabetic, who presented with acute pulmonary edema, shortness of breath and almost required intubation.  Cardiac enzymes were positive. Echocardiogram showed severe LV dysfunction with EF of 20-25%.  He was stabilized and underwent left and right heart catheterization, which showed severe diabetic pattern coronary artery disease.  There was mild MR.  EF was 20-25% with akinesis of the anterior apical and inferior apical walls.  Surgical consultation was requested for CABG.  I was concerned that his LV function was not  adequate for CABG.  The patient had a cardiac MRI viability study, which showed there was significant remaining viable myocardium, but there was a definite scar in the distal LAD distribution.  I discussed CABG with the patient's best long-term option, although he understood it was at high risk.  I discussed the indications, benefits, alternatives and risks.  I discussed the details of surgery including the use of general anesthesia, cardiopulmonary bypass and location of the surgical incisions.  I discussed the expected postoperative hospital recovery as well as the risks to him of the surgery.  He understood the risk of stroke, bleeding, MI, multisystem failure, postoperative lung problems including pleural effusions, and death.  He understood the risk of infection, both wound infection or systemic infection.  We discussed the issues of non-tuberculous mycobacterium as a small, but reported risk and heart surgery patients as described by the Centers for Disease Control and he understood that our center has not had this problem so far, but we are vigilant about checking it and trying to prevent it. After all these issues were discussed, he demonstrated his understanding and agreed to proceed with surgery under what I felt was an informed consent.  OPERATIVE FINDINGS: 1. Severe coronaries with suboptimal targets. 2. Adequate conduit. 3. 1 unit of packed cells required for the surgery.  OPERATIVE PROCEDURE:  The patient was brought to the operating room, placed supine on the  operating table, general anesthesia was induced under invasive hemodynamic monitoring.  A transesophageal echo probe was placed by the anesthesiologist and this confirmed the preoperative diagnosis of severe ischemic cardiomyopathy without significant MR.  The patient was prepped and draped as a sterile field.  A proper time-out was performed.  A sternal incision was made as the saphenous vein was  harvested endoscopically from the right leg.  The left internal mammary artery was harvested as a pedicle graft from its origin at the subclavian vessels. It was a small vessel with adequate flow.  The sternal retractor was placed and the pericardium was opened and suspended.  Pursestrings were placed in the ascending aorta and right atrium, and heparin was administered.  When the ACT was documented as being therapeutic, the patient was cannulated and placed on cardiopulmonary bypass.  The coronaries were identified for grafting. The mammary artery and vein grafts were prepared for the distal anastomoses.  Cardioplegia cannulas were placed for both antegrade and retrograde cold blood cardioplegia.  The patient was cooled to 32 degrees and aortic crossclamp was applied.  One liter of cold blood cardioplegia was delivered in split doses between the antegrade aortic and retrograde coronary sinus catheters.  There was good cardioplegic arrest.  The distal coronary anastomoses were performed.  The first distal anastomosis was to the distal RCA beyond the bifurcation of the small posterior descending.  There was a 90% stenosis at this bifurcation.  A reverse saphenous vein was sewn end-to-side with running 7-0 Prolene with good flow through the graft.  The RCA coronary was very thickened and calcified.  The second distal anastomosis was to the distal circumflex branch of left coronary.  It had proximal 95% stenosis.  A reverse saphenous vein was sewn end-to-side to this 1.5-mm vessel and there was good flow through the graft.  Cardioplegia was redosed.  The third distal anastomosis was to the ramus intermedius branch of the left coronary.  There was a small 1.4-mm vessel with proximal 90% stenosis.  It was intramyocardial.  A reverse saphenous vein was sewn end-to-side with running 7-0 Prolene with good flow through the graft. Cardioplegia was redosed.  The fourth distal anastomosis was  to the mid-LAD.  It was a 1.5-mm vessel with proximal 99% stenosis.  The anterior wall was thinned with chronic scar, but there was some benefit to be gained to the patient's septum and therefore to the right ventricle by grafting this.  The left IMA pedicle was brought through an opening in the left lateral pericardium, was brought down onto the LAD and sewn end-to-side with running 8-0 Prolene.  There was good flow through the anastomosis after briefly releasing the bulldog on the mammary pedicle.  The bulldog was reapplied and the pedicle was secured to the epicardium.  Cardioplegia was redosed.  While the crossclamp was still in place, the proximal vein anastomoses were performed.  Two 4.5-mm punches were made in the ascending aorta and the vein to the circumflex and vein to the distal right were sewn end-to- side with running 6-0 Prolene.  The proximal anastomosis of the vein to the ramus intermedius was placed to the hood of the vein graft of the circumflex because of length issues of the vein available.  The clamps were removed after the coronaries were reperfused with warm retrograde cardioplegia and the heart resumed a spontaneous rhythm.  The patient was rewarmed.  The distal anastomoses were checked.  The three vein distal anastomoses and proximal anastomoses were hemostatic. The  mammary artery anastomosis was bleeding.  Attempts were made to control the bleeding with sutures while the heart was beating.  However, I was not comfortable with the outcome and it was decided to use a short period of hypothermic cardioplegic arrest to revise-redo the mammary to LAD anastomosis, which was completed with a running 8-0 Prolene.  At the end of this anastomosis, the bulldog was removed and there was good flow in a hemostatic anastomosis.  The crossclamp was then removed and the patient was rewarmed again.  He was reperfused.  He did not require cardioversion.  Temporary pacing wires  were applied.  The lungs were expanded.  When the patient was adequately reperfused, he was placed on low-dose inotropes because of his poor LV function and weaned off cardiopulmonary bypass without difficulty.  Cardiac output, and blood and hemodynamics were stable.  Protamine was administered without adverse reaction.  The cannulas were removed.  A femoral A-line was placed for blood pressure monitoring.  The patient remained stable.  The superior pericardial fat was closed over the aorta.  A mediastinal and left pleural chest tube were placed and brought out through separate incisions.  The sternum was closed with wire.  The pectoralis fascia was closed with running #1 Vicryl.  The subcutaneous and skin layers were closed with running Vicryl and sterile dressings were applied.  Total cardiopulmonary bypass time was 180 minute.     Bradley PernaPeter Van Trigt, M.D.     PV/MEDQ  D:  02/01/2015  T:  02/02/2015  Job:  [id number]  cc:   Marca Anconaalton McLean, MD

## 2015-02-02 NOTE — Progress Notes (Signed)
TCTS BRIEF SICU PROGRESS NOTE  1 Day Post-Op  S/P Procedure(s) (LRB): CORONARY ARTERY BYPASS GRAFTING (CABG)x 4 using left internal mammary artery and right greater saphenous leg vein using endoscope. (N/A) TRANSESOPHAGEAL ECHOCARDIOGRAM (TEE) (N/A)  CENTRIMAG VENTRICULAR ASSIST DEVICE, back up (N/A)   Stable day.  No further episodes of CP or HTN like this morning Maintaining NSR - AAI paced rhythm w/ stable BP O2 sats 95% on 4 L/min UOP adequate Labs okay w/ Hgb up to 8.7   Plan: Continue current plan.  D/C femoral Aline.  Mobilize  Purcell Nailslarence H Owen, MD 02/02/2015 6:14 PM

## 2015-02-03 ENCOUNTER — Inpatient Hospital Stay (HOSPITAL_COMMUNITY): Payer: PPO

## 2015-02-03 LAB — GLUCOSE, CAPILLARY
GLUCOSE-CAPILLARY: 117 mg/dL — AB (ref 65–99)
GLUCOSE-CAPILLARY: 129 mg/dL — AB (ref 65–99)
GLUCOSE-CAPILLARY: 153 mg/dL — AB (ref 65–99)
GLUCOSE-CAPILLARY: 159 mg/dL — AB (ref 65–99)
GLUCOSE-CAPILLARY: 188 mg/dL — AB (ref 65–99)
Glucose-Capillary: 105 mg/dL — ABNORMAL HIGH (ref 65–99)
Glucose-Capillary: 119 mg/dL — ABNORMAL HIGH (ref 65–99)

## 2015-02-03 LAB — BASIC METABOLIC PANEL
ANION GAP: 8 (ref 5–15)
BUN: 21 mg/dL — ABNORMAL HIGH (ref 6–20)
CO2: 24 mmol/L (ref 22–32)
Calcium: 8.1 mg/dL — ABNORMAL LOW (ref 8.9–10.3)
Chloride: 105 mmol/L (ref 101–111)
Creatinine, Ser: 1.45 mg/dL — ABNORMAL HIGH (ref 0.61–1.24)
GFR calc Af Amer: 56 mL/min — ABNORMAL LOW (ref >60)
GFR calc non Af Amer: 49 mL/min — ABNORMAL LOW (ref >60)
GLUCOSE: 124 mg/dL — AB (ref 65–99)
POTASSIUM: 4.4 mmol/L (ref 3.5–5.1)
Sodium: 137 mmol/L (ref 135–145)

## 2015-02-03 LAB — CBC
HEMATOCRIT: 25.6 % — AB (ref 39.0–52.0)
HEMOGLOBIN: 8.6 g/dL — AB (ref 13.0–17.0)
MCH: 27.9 pg (ref 26.0–34.0)
MCHC: 33.6 g/dL (ref 30.0–36.0)
MCV: 83.1 fL (ref 78.0–100.0)
Platelets: 127 10*3/uL — ABNORMAL LOW (ref 150–400)
RBC: 3.08 MIL/uL — AB (ref 4.22–5.81)
RDW: 13.7 % (ref 11.5–15.5)
WBC: 10.6 10*3/uL — ABNORMAL HIGH (ref 4.0–10.5)

## 2015-02-03 LAB — POCT I-STAT 3, ART BLOOD GAS (G3+)
ACID-BASE EXCESS: 1 mmol/L (ref 0.0–2.0)
BICARBONATE: 25.3 meq/L — AB (ref 20.0–24.0)
O2 Saturation: 96 %
PH ART: 7.453 — AB (ref 7.350–7.450)
TCO2: 26 mmol/L (ref 0–100)
pCO2 arterial: 35.8 mmHg (ref 35.0–45.0)
pO2, Arterial: 71 mmHg — ABNORMAL LOW (ref 80.0–100.0)

## 2015-02-03 LAB — POCT I-STAT 4, (NA,K, GLUC, HGB,HCT)
Glucose, Bld: 140 mg/dL — ABNORMAL HIGH (ref 65–99)
HCT: 26 % — ABNORMAL LOW (ref 39.0–52.0)
Hemoglobin: 8.8 g/dL — ABNORMAL LOW (ref 13.0–17.0)
POTASSIUM: 4.1 mmol/L (ref 3.5–5.1)
SODIUM: 140 mmol/L (ref 135–145)

## 2015-02-03 MED ORDER — SODIUM CHLORIDE 0.9 % IJ SOLN: 3.0000 mL | INTRAMUSCULAR | Status: DC | PRN

## 2015-02-03 MED ORDER — INSULIN ASPART 100 UNIT/ML ~~LOC~~ SOLN
0.0000 [IU] | Freq: Three times a day (TID) | SUBCUTANEOUS | Status: DC
  Administered 2015-02-03: 3 [IU] via SUBCUTANEOUS
  Administered 2015-02-04 (×3): 2 [IU] via SUBCUTANEOUS
  Administered 2015-02-05 (×2): 3 [IU] via SUBCUTANEOUS
  Administered 2015-02-05: 5 [IU] via SUBCUTANEOUS
  Administered 2015-02-06: 3 [IU] via SUBCUTANEOUS
  Administered 2015-02-06 – 2015-02-10 (×6): 2 [IU] via SUBCUTANEOUS
  Administered 2015-02-12: 3 [IU] via SUBCUTANEOUS
  Administered 2015-02-12: 2 [IU] via SUBCUTANEOUS
  Administered 2015-02-13: 3 [IU] via SUBCUTANEOUS
  Administered 2015-02-13: 2 [IU] via SUBCUTANEOUS
  Administered 2015-02-14 (×2): 3 [IU] via SUBCUTANEOUS
  Administered 2015-02-15: 2 [IU] via SUBCUTANEOUS

## 2015-02-03 MED ORDER — ENOXAPARIN SODIUM 30 MG/0.3ML ~~LOC~~ SOLN
30.0000 mg | SUBCUTANEOUS | Status: DC
  Administered 2015-02-03 – 2015-02-05 (×3): 30 mg via SUBCUTANEOUS
  Filled 2015-02-03 (×3): qty 0.3

## 2015-02-03 MED ORDER — MOVING RIGHT ALONG BOOK
Freq: Once | Status: AC
  Administered 2015-02-03: 10:00:00
  Filled 2015-02-03: qty 1

## 2015-02-03 MED ORDER — LEVALBUTEROL HCL 0.63 MG/3ML IN NEBU
0.6300 mg | INHALATION_SOLUTION | Freq: Three times a day (TID) | RESPIRATORY_TRACT | Status: DC
  Administered 2015-02-05 – 2015-02-15 (×26): 0.63 mg via RESPIRATORY_TRACT
  Filled 2015-02-03 (×31): qty 3

## 2015-02-03 MED ORDER — SODIUM CHLORIDE 0.9 % IV SOLN: 250.0000 mL | INTRAVENOUS | Status: DC | PRN

## 2015-02-03 MED ORDER — SODIUM CHLORIDE 0.9 % IJ SOLN
3.0000 mL | Freq: Two times a day (BID) | INTRAMUSCULAR | Status: DC
  Administered 2015-02-03 – 2015-02-08 (×3): 3 mL via INTRAVENOUS

## 2015-02-03 NOTE — Progress Notes (Signed)
TCTS BRIEF SICU PROGRESS NOTE  2 Days Post-Op  S/P Procedure(s) (LRB): CORONARY ARTERY BYPASS GRAFTING (CABG)x 4 using left internal mammary artery and right greater saphenous leg vein using endoscope. (N/A) TRANSESOPHAGEAL ECHOCARDIOGRAM (TEE) (N/A)  CENTRIMAG VENTRICULAR ASSIST DEVICE, back up (N/A)   Stable day  Plan: Continue current plan  Purcell Nailslarence H Dominie Benedick, MD 02/03/2015 8:08 PM

## 2015-02-03 NOTE — Progress Notes (Signed)
[address]       [name] [address]             [phone]        CARDIOTHORACIC SURGERY PROGRESS NOTE   R2 Days Post-Op Procedure(s) (LRB): CORONARY ARTERY BYPASS GRAFTING (CABG)x 4 using left internal mammary artery and right greater saphenous leg vein using endoscope. (N/A) TRANSESOPHAGEAL ECHOCARDIOGRAM (TEE) (N/A)  CENTRIMAG VENTRICULAR ASSIST DEVICE, back up (N/A)  Subjective: Looks good today.  Complains of feeling dizzy.  Mild "pressure" in chest.  No SOB  Objective: Vital signs: BP Readings from Last 1 Encounters:  02/03/15 113/69   Pulse Readings from Last 1 Encounters:  02/03/15 90   Resp Readings from Last 1 Encounters:  02/03/15 24   Temp Readings from Last 1 Encounters:  02/03/15 98.1 F (36.7 C) Axillary    Hemodynamics:    Physical Exam:  Rhythm:   NSR  Breath sounds: clear  Heart sounds:  RRR  Incisions:  Dressing dry, intact  Abdomen:  Soft, non-distended, non-tender  Extremities:  Warm, well-perfused   Intake/Output from previous day: 01/07 0701 - 01/08 0700 In: 1392.7 [P.O.:600; I.V.:692.7; IV Piggyback:100] Out: 1355 [Urine:1285; Chest Tube:70] Intake/Output this shift: Total I/O In: 172.4 [P.O.:120; I.V.:52.4] Out: 45 [Urine:45]  Lab Results:  CBC: Recent Labs  02/02/15 1700 02/02/15 1734 02/03/15 0413  WBC 9.8  --  10.6*  HGB 8.7* 8.2* 8.6*  HCT 25.5* 24.0* 25.6*  PLT 116*  --  127*    BMET:  Recent Labs  02/02/15 0446  02/02/15 1734 02/03/15 0413  NA 139  --  136 137  K 4.5  --  4.2 4.4  CL 110  --  106 105  CO2 24  --   --  24  GLUCOSE 106*  --  111* 124*  BUN 15  --  19 21*  CREATININE 1.21  < > 1.40* 1.45*  CALCIUM 8.1*  --   --  8.1*  < > = values in this interval not displayed.   PT/INR:   Recent Labs  02/01/15 1500  LABPROT 17.6*  INR 1.44    CBG (last 3)   Recent Labs  02/02/15 2334 02/03/15 0350 02/03/15 0719  GLUCAP 117* 119* 129*    ABG    Component Value  Date/Time   PHART 7.417 02/01/2015 2212   PCO2ART 37.9 02/01/2015 2212   PO2ART 84.0 02/01/2015 2212   HCO3 24.3* 02/01/2015 2212   TCO2 22 02/02/2015 1734   ACIDBASEDEF 0.6 01/31/2015 2040   O2SAT 96.0 02/01/2015 2212    CXR: PORTABLE CHEST 1 VIEW  COMPARISON: Chest x-rays dated 02/02/2015 and 02/01/2015.  FINDINGS: Left-sided chest tube is no longer seen. Swan-Ganz catheter is also no longer seen. Mediastinal drains have also been removed. Right IJ central line access site remains with tip projected over the upper SVC. Right-sided PICC line appears well positioned with tip projected over the lower SVC.  Cardiomediastinal silhouette is stable in size and configuration. Cardiomegaly is stable. Median sternotomy wires are intact and stable in alignment, presumably for CABG.  There is mild central pulmonary vascular congestion. Ill-defined bibasilar airspace opacities are not not significantly changed, presumably atelectasis and/or small effusions. No new lung findings. No pneumothorax seen.  IMPRESSION: 1. Multiple tubes and lines removed, as above. 2. Persistent mild central pulmonary vascular congestion without evidence of overt pulmonary edema. 3. Persistent ill-defined bibasilar airspace opacities, presumably atelectasis and/or small effusions. 4. No new lung  findings. 5. Stable cardiomegaly.   Electronically Signed  By: Bary RichardStan Maynard M.D.  On: 02/03/2015 08:02  Assessment/Plan: S/P Procedure(s) (LRB): CORONARY ARTERY BYPASS GRAFTING (CABG)x 4 using left internal mammary artery and right greater saphenous leg vein using endoscope. (N/A) TRANSESOPHAGEAL ECHOCARDIOGRAM (TEE) (N/A)  CENTRIMAG VENTRICULAR ASSIST DEVICE, back up (N/A)  Overall stable POD2 Maintaining NSR w/ stable BP on low dose milrinone Expected post op acute blood loss anemia, Hgb stable 8.6 Chronic combined systolic and diastolic CHF with expected post-op volume excess Expected post  op atelectasis, mild Type II diabetes mellitus, excellent glycemic control   Wean milrinone off  Consider adding ACE-I or ARB in 1-2 days  D/C foley  Mobilize  Change CBG's and SSI to ac/hs  lovenox for DVT prophylaxis  Keep in SICU  Bradley Nailslarence H Jadavion Spoelstra, MD 02/03/2015 9:42 AM

## 2015-02-04 ENCOUNTER — Inpatient Hospital Stay (HOSPITAL_COMMUNITY): Payer: PPO

## 2015-02-04 ENCOUNTER — Encounter (HOSPITAL_COMMUNITY): Payer: Self-pay | Admitting: Cardiothoracic Surgery

## 2015-02-04 LAB — BASIC METABOLIC PANEL
Anion gap: 7 (ref 5–15)
BUN: 27 mg/dL — AB (ref 6–20)
CALCIUM: 8.3 mg/dL — AB (ref 8.9–10.3)
CO2: 25 mmol/L (ref 22–32)
CREATININE: 1.32 mg/dL — AB (ref 0.61–1.24)
Chloride: 105 mmol/L (ref 101–111)
GFR calc Af Amer: >60 mL/min (ref >60)
GFR, EST NON AFRICAN AMERICAN: 55 mL/min — AB (ref >60)
Glucose, Bld: 152 mg/dL — ABNORMAL HIGH (ref 65–99)
Potassium: 4.2 mmol/L (ref 3.5–5.1)
SODIUM: 137 mmol/L (ref 135–145)

## 2015-02-04 LAB — TYPE AND SCREEN
ABO/RH(D): B POS
Antibody Screen: NEGATIVE
Unit division: 0
Unit division: 0
Unit division: 0
Unit division: 0

## 2015-02-04 LAB — CBC
HCT: 26.7 % — ABNORMAL LOW (ref 39.0–52.0)
Hemoglobin: 8.9 g/dL — ABNORMAL LOW (ref 13.0–17.0)
MCH: 27.9 pg (ref 26.0–34.0)
MCHC: 33.3 g/dL (ref 30.0–36.0)
MCV: 83.7 fL (ref 78.0–100.0)
PLATELETS: 141 10*3/uL — AB (ref 150–400)
RBC: 3.19 MIL/uL — ABNORMAL LOW (ref 4.22–5.81)
RDW: 13.6 % (ref 11.5–15.5)
WBC: 9.9 10*3/uL (ref 4.0–10.5)

## 2015-02-04 LAB — GLUCOSE, CAPILLARY
GLUCOSE-CAPILLARY: 155 mg/dL — AB (ref 65–99)
GLUCOSE-CAPILLARY: 151 mg/dL — AB (ref 65–99)
GLUCOSE-CAPILLARY: 139 mg/dL — AB (ref 65–99)
Glucose-Capillary: 142 mg/dL — ABNORMAL HIGH (ref 65–99)

## 2015-02-04 MED ORDER — SPIRONOLACTONE 12.5 MG HALF TABLET
12.5000 mg | ORAL_TABLET | Freq: Every day | ORAL | Status: DC
  Administered 2015-02-04: 12.5 mg via ORAL
  Filled 2015-02-04 (×2): qty 1

## 2015-02-04 MED ORDER — CARVEDILOL 3.125 MG PO TABS
3.1250 mg | ORAL_TABLET | Freq: Two times a day (BID) | ORAL | Status: DC
  Administered 2015-02-04 – 2015-02-08 (×7): 3.125 mg via ORAL
  Filled 2015-02-04 (×7): qty 1

## 2015-02-04 MED ORDER — DIGOXIN 125 MCG PO TABS
0.1250 mg | ORAL_TABLET | Freq: Every day | ORAL | Status: DC
  Administered 2015-02-04 – 2015-02-15 (×12): 0.125 mg via ORAL
  Filled 2015-02-04 (×12): qty 1

## 2015-02-04 MED ORDER — LISINOPRIL 2.5 MG PO TABS
2.5000 mg | ORAL_TABLET | Freq: Every day | ORAL | Status: DC
  Administered 2015-02-04: 2.5 mg via ORAL
  Filled 2015-02-04 (×2): qty 1

## 2015-02-04 MED ORDER — FUROSEMIDE 10 MG/ML IJ SOLN
20.0000 mg | Freq: Two times a day (BID) | INTRAMUSCULAR | Status: DC
  Administered 2015-02-04 (×2): 20 mg via INTRAVENOUS
  Filled 2015-02-04 (×2): qty 2

## 2015-02-04 MED ORDER — GLUCERNA SHAKE PO LIQD
237.0000 mL | Freq: Three times a day (TID) | ORAL | Status: DC
  Administered 2015-02-04 – 2015-02-15 (×8): 237 mL via ORAL
  Filled 2015-02-04: qty 237

## 2015-02-04 NOTE — Progress Notes (Signed)
3 Days Post-Op Procedure(s) (LRB): CORONARY ARTERY BYPASS GRAFTING (CABG)x 4 using left internal mammary artery and right greater saphenous leg vein using endoscope. (N/A) TRANSESOPHAGEAL ECHOCARDIOGRAM (TEE) (N/A)  CENTRIMAG VENTRICULAR ASSIST DEVICE, back up (N/A) Subjective: Status post CABG 4--history of ischemic cardiomyopathy with presentation and class IV heart failure Patient very weak postop and will need physical therapy Drips were weaned off maintaining sinus rhythm Mild edema and fluid retention postop now on twice a day IV Lasix Patient with preoperative diagnosis of apical thrombus but not a Coumadin candidate due to very poor compliance with medications-is continue daily Lovenox  Objective: Vital signs in last 24 hours: Temp:  [97.4 F (36.3 C)-98.8 F (37.1 C)] 97.6 F (36.4 C) (01/09 1500) Pulse Rate:  [70-85] 70 (01/09 1800) Cardiac Rhythm:  [-] Normal sinus rhythm (01/09 0800) Resp:  [11-28] 23 (01/09 1800) BP: (102-130)/(65-90) 104/78 mmHg (01/09 1800) SpO2:  [92 %-99 %] 98 % (01/09 1800) Weight:  [233 lb 7.5 oz (105.9 kg)] 233 lb 7.5 oz (105.9 kg) (01/09 0600)  Hemodynamic parameters for last 24 hours: CVP:  [9 mmHg-14 mmHg] 14 mmHg  Intake/Output from previous day: 01/08 0701 - 01/09 0700 In: 401.9 [P.O.:300; I.V.:101.9] Out: 1000 [Urine:1000] Intake/Output this shift: Total I/O In: 600 [P.O.:600] Out: 250 [Urine:250]  Sitting in chair alert and responsive Breath sounds clear Heart rhythm regular Mild-moderate peripheral edema No focal motor deficit Abdomen nontender  Lab Results:  Recent Labs  02/03/15 0413 02/04/15 0346  WBC 10.6* 9.9  HGB 8.6* 8.9*  HCT 25.6* 26.7*  PLT 127* 141*   BMET:  Recent Labs  02/03/15 0413 02/04/15 0346  NA 137 137  K 4.4 4.2  CL 105 105  CO2 24 25  GLUCOSE 124* 152*  BUN 21* 27*  CREATININE 1.45* 1.32*  CALCIUM 8.1* 8.3*    PT/INR: No results for input(s): LABPROT, INR in the last 72 hours. ABG    Component Value Date/Time   PHART 7.417 02/01/2015 2212   HCO3 24.3* 02/01/2015 2212   TCO2 22 02/02/2015 1734   ACIDBASEDEF 0.6 01/31/2015 2040   O2SAT 96.0 02/01/2015 2212   CBG (last 3)   Recent Labs  02/04/15 0757 02/04/15 1211 02/04/15 1539  GLUCAP 142* 155* 151*    Assessment/Plan: S/P Procedure(s) (LRB): CORONARY ARTERY BYPASS GRAFTING (CABG)x 4 using left internal mammary artery and right greater saphenous leg vein using endoscope. (N/A) TRANSESOPHAGEAL ECHOCARDIOGRAM (TEE) (N/A)  CENTRIMAG VENTRICULAR ASSIST DEVICE, back up (N/A) Mobilize Diuresis Diabetes control Continue daily Lovenox   LOS: 12 days    Bradley Benitez 02/04/2015

## 2015-02-04 NOTE — Progress Notes (Signed)
Patient ID: Bradley Benitez, male   DOB: 1950-09-12, 65 y.o.   MRN: [id number]   65 yo with history of DM and smoking but has not had medical care in decades presented with acute systolic CHF.  Echo showed EF 25-30% with apical thrombus and LHC showed 3VD.    LHC/RHC (12/16):  RA mean 11 PA 35/11 PCWP mean 23 CI 2.59   Ost RPDA lesion, 70% stenosed.  1st RPLB lesion, 80% stenosed.  Mid Cx lesion, 80% stenosed.  Prox LAD lesion, 60% stenosed.  Mid LAD lesion, 95% stenosed.  Ost 2nd Diag lesion, 70% stenosed.  Ost Ramus to Ramus lesion, 90% stenosed  Cardiac MRI (1/17): 1. Severe LV systolic dysfunction, EF 26%. Wall motion abnormalities as above. 2. Normal RV size and systolic function . 3. LGE pattern suggestive of infarction in LAD territory. The mid anterior/anteroseptal and apical anterior/septal/inferior wall segments and the true apex do not appear viable (probably not likely to improve function with revascularization). The remainder of the left ventricle appears viable. 4. There is a small LV apical thrombus.  CABG x 4 on 1/6.   He is stable today, having surgical site pain. +Orthopnea.   Scheduled Meds: . acetaminophen  1,000 mg Oral 4 times per day  . aspirin EC  325 mg Oral Daily  . atorvastatin  80 mg Oral q1800  . bisacodyl  10 mg Oral Daily   Or  . bisacodyl  10 mg Rectal Daily  . carvedilol  3.125 mg Oral BID WC  . digoxin  0.125 mg Oral Daily  . docusate sodium  200 mg Oral Daily  . enoxaparin (LOVENOX) injection  30 mg Subcutaneous Q24H  . feeding supplement (GLUCERNA SHAKE)  237 mL Oral TID BM  . furosemide  20 mg Intravenous BID  . insulin aspart  0-15 Units Subcutaneous TID WC  . insulin detemir  20 Units Subcutaneous Daily  . levalbuterol  0.63 mg Nebulization TID  . lisinopril  2.5 mg Oral Daily  . pantoprazole  40 mg Oral Daily  . sodium chloride  3 mL Intravenous Q12H  . sodium chloride  3 mL Intravenous Q12H  . spironolactone   12.5 mg Oral Daily   Continuous Infusions: . sodium chloride Stopped (02/03/15 1100)   PRN Meds:.sodium chloride, metoprolol, ondansetron (ZOFRAN) IV, oxyCODONE, sodium chloride, sodium chloride, traMADol    Filed Vitals:   02/04/15 0900 02/04/15 1000 02/04/15 1100 02/04/15 1200  BP: 114/75 112/83 102/70 130/74  Pulse: 76 72 73 74  Temp:      TempSrc:      Resp: 23 21 18 18   Height:      Weight:      SpO2: 97% 95% 98% 99%    Intake/Output Summary (Last 24 hours) at 02/04/15 1211 Last data filed at 02/04/15 1200  Gross per 24 hour  Intake    540 ml  Output    850 ml  Net   -310 ml    LABS: Basic Metabolic Panel:  Recent Labs  40/98/11 0446 02/02/15 1700  02/03/15 0413 02/04/15 0346  NA 139  --   < > 137 137  K 4.5  --   < > 4.4 4.2  CL 110  --   < > 105 105  CO2 24  --   --  24 25  GLUCOSE 106*  --   < > 124* 152*  BUN 15  --   < > 21* 27*  CREATININE 1.21 1.54*  < >  1.45* 1.32*  CALCIUM 8.1*  --   --  8.1* 8.3*  MG 2.6* 2.4  --   --   --   < > = values in this interval not displayed. Liver Function Tests: No results for input(s): AST, ALT, ALKPHOS, BILITOT, PROT, ALBUMIN in the last 72 hours. No results for input(s): LIPASE, AMYLASE in the last 72 hours. CBC:  Recent Labs  02/03/15 0413 02/04/15 0346  WBC 10.6* 9.9  HGB 8.6* 8.9*  HCT 25.6* 26.7*  MCV 83.1 83.7  PLT 127* 141*   Cardiac Enzymes: No results for input(s): CKTOTAL, CKMB, CKMBINDEX, TROPONINI in the last 72 hours. BNP: Invalid input(s): POCBNP D-Dimer: No results for input(s): DDIMER in the last 72 hours. Hemoglobin A1C: No results for input(s): HGBA1C in the last 72 hours. Fasting Lipid Panel: No results for input(s): CHOL, HDL, LDLCALC, TRIG, CHOLHDL, LDLDIRECT in the last 72 hours. Thyroid Function Tests: No results for input(s): TSH, T4TOTAL, T3FREE, THYROIDAB in the last 72 hours.  Invalid input(s): FREET3 Anemia Panel: No results for input(s): VITAMINB12, FOLATE,  FERRITIN, TIBC, IRON, RETICCTPCT in the last 72 hours.  RADIOLOGY: Dg Chest 2 View  02/04/2015  CLINICAL DATA:  Acute respiratory failure, CHF, previous MI, former smoker. EXAM: CHEST  2 VIEW COMPARISON:  Portable chest x-ray of February 03, 2015 FINDINGS: The lungs are borderline hypoinflated. There is a trace of pleural fluid on the right. Right basilar atelectasis or pneumonia has improved. There remains retrocardiac subsegmental atelectasis but it has improved as well. The cardiac silhouette remains enlarged. The pulmonary vascularity is less engorged. There are post CABG changes. The right-sided PICC line tip projects over the distal third of the SVC. The right internal jugular Cordis sheath has been removed. IMPRESSION: Mild interval improvement in bibasilar atelectasis or pneumonia. Trace right pleural effusion remains. Stable mild cardiomegaly with decreased pulmonary vascular congestion. Electronically Signed   By: Murrel  [place] M.D.   On: 02/04/2015 08:04   Dg Chest 2 View  01/26/2015  CLINICAL DATA:  Shortness of breath this AM; h/o diabetes; former smoker EXAM: CHEST  2 VIEW COMPARISON:  01/25/2015 FINDINGS: Heart size is normal. There is prominence of interstitial markings, increased. Perihilar peribronchial thickening and noted. There are no focal consolidations. There are small bilateral pleural effusions. IMPRESSION: 1. Increased bronchitic changes. 2. Increased bilateral pleural effusions. Electronically Signed   By: Norva Pavlov M.D.   On: 01/26/2015 10:34   Dg Chest Port 1 View  02/03/2015  CLINICAL DATA:  Atelectasis EXAM: PORTABLE CHEST 1 VIEW COMPARISON:  Chest x-rays dated 02/02/2015 and 02/01/2015. FINDINGS: Left-sided chest tube is no longer seen. Swan-Ganz catheter is also no longer seen. Mediastinal drains have also been removed. Right IJ central line access site remains with tip projected over the upper SVC. Right-sided PICC line appears well positioned with tip projected over  the lower SVC. Cardiomediastinal silhouette is stable in size and configuration. Cardiomegaly is stable. Median sternotomy wires are intact and stable in alignment, presumably for CABG. There is mild central pulmonary vascular congestion. Ill-defined bibasilar airspace opacities are not not significantly changed, presumably atelectasis and/or small effusions. No new lung findings. No pneumothorax seen. IMPRESSION: 1. Multiple tubes and lines removed, as above. 2. Persistent mild central pulmonary vascular congestion without evidence of overt pulmonary edema. 3. Persistent ill-defined bibasilar airspace opacities, presumably atelectasis and/or small effusions. 4. No new lung findings. 5. Stable cardiomegaly. Electronically Signed   By: Bary Richard M.D.   On: 02/03/2015 08:02  Dg Chest Port 1 View  02/02/2015  CLINICAL DATA:  65 year old male status post 4 vessel CABG EXAM: PORTABLE CHEST 1 VIEW COMPARISON:  Prior chest x-ray 02/01/2015 FINDINGS: The patient has been extubated and the nasogastric tube removed. A right IJ vascular sheath convey is a Swan-Ganz catheter into the heart. The tip of the Swan overlies the proximal right lower lobe pulmonary artery. A right upper extremity PICC is again noted in good position with the tip overlying the upper right atrium. Mediastinal and left thoracic drains are present via a subxiphoid approach. Patient is status post median sternotomy with evidence of multivessel CABG. Stable cardiomegaly. Mediastinal contours are grossly unchanged given lower inspiratory volumes. Lower inspiratory volumes with increased bibasilar opacities favored to reflect atelectasis. Pulmonary vascular congestion without overt edema. No evidence of pneumothorax or large effusion. No acute osseous abnormality. IMPRESSION: 1. Interval extubation and removal of nasogastric tube. 2. Lower inspiratory volumes with increasing bibasilar atelectasis. 3. Pulmonary vascular congestion without overt edema.  4. Other support apparatus in stable and satisfactory position as above. Electronically Signed   By: Malachy Moan M.D.   On: 02/02/2015 08:54   Dg Chest Port 1 View  02/01/2015  CLINICAL DATA:  Patient status post CABG procedure. EXAM: PORTABLE CHEST 1 VIEW COMPARISON:  Chest radiograph 01/26/2015. FINDINGS: ET tube terminates in the mid trachea. Enteric tube courses inferior to the diaphragm. Right IJ approach pulmonary arterial catheter is present with tip projecting over the expected location of the right main pulmonary artery. Multiple mediastinal drains are present. Right upper extremity PICC line is present with tip projecting at the superior cavoatrial junction. Left chest tube in place. Stable cardiac and mediastinal contours status post median sternotomy and CABG procedure. No large area of pulmonary consolidation. No definite pleural effusion or pneumothorax. No aggressive or acute appearing osseous lesions. IMPRESSION: Support apparatus as above. Electronically Signed   By: Annia Belt M.D.   On: 02/01/2015 16:04   Dg Chest Port 1 View  01/25/2015  CLINICAL DATA:  Acute respiratory failure with hypoxemia.  Fever. EXAM: PORTABLE CHEST 1 VIEW COMPARISON:  01/23/2015. FINDINGS: Grossly stable enlarged cardiac silhouette. Prominent pulmonary vasculature and interstitial markings with improvement. Interval small amount of linear density in the right mid lung zone. Minimal right pleural fluid. Central peribronchial thickening. Unremarkable bones. IMPRESSION: 1. Improving changes of congestive heart failure. 2. Small amount of linear atelectasis in the right mid lung zone. 3. Moderate bronchitic changes. Electronically Signed   By: Beckie Salts M.D.   On: 01/25/2015 16:34   Dg Chest Port 1 View  01/23/2015  CLINICAL DATA:  Congestive heart failure EXAM: PORTABLE CHEST 1 VIEW COMPARISON:  None. FINDINGS: There is generalized interstitial edema with patchy alveolar edema in the bases. Heart is  enlarged with pulmonary venous hypertension. No adenopathy. IMPRESSION: Evidence of congestive heart failure with edema most pronounced in the lung bases. Electronically Signed   By: Bretta Bang III M.D.   On: 01/23/2015 09:34   Mr Card Morphology Wo/w Cm  01/29/2015  CLINICAL DATA:  CAD, assess for viability EXAM: CARDIAC MRI TECHNIQUE: The patient was scanned on a 1.5 Tesla GE magnet. A dedicated cardiac coil was used. Functional imaging was done using Fiesta sequences. 2,3, and 4 chamber views were done to assess for RWMA's. Modified Simpson's rule using a short axis stack was used to calculate an ejection fraction on a dedicated work [employer]. The patient received 34 cc of Multihance. After 10 minutes  inversion recovery sequences were used to assess for infiltration and scar tissue. CONTRAST:  34 cc Multihance FINDINGS: There were bilateral small to moderate pleural effusions. There was a trivial pericardial effusion. Normal left ventricular size. EF 26% with mid anteroseptal and mid anterior akinesis. The mid anterolateral wall was hypokinetic. The apical septal, inferior, and anterior segments were akinetic and the apical lateral segment was hypokinetic. The true apex was akinetic. A small apical thrombus was noted. The atria were normal in size. The right ventricle was normal in size and systolic function. The aortic valve was trileaflet with no stenosis or significant regurgitation. There did not appear to be significant mitral regurgitation. On delayed enhancement imaging, there was subendocardial 76-99% wall thickness late gadolinium enhancement (LGE) in the mid anteroseptal and mid anterior wall segments, in the apical septal/anterior/inferior wall segments, and in the true apex. Basal segments without LGE, anterolateral wall without significant LGE, and mid inferior wall without LGE. MEASUREMENTS: MEASUREMENTS LV EDV 212 mL LV SV 54 mL LV EF 26% IMPRESSION: 1. Severe LV  systolic dysfunction, EF 26%. Wall motion abnormalities as above. 2.  Normal RV size and systolic function . 3. LGE pattern suggestive of infarction in LAD territory. The mid anterior/anteroseptal and apical anterior/septal/inferior wall segments and the true apex do not appear viable (probably not likely to improve function with revascularization). The remainder of the left ventricle appears viable. 4.  There is a small LV apical thrombus. Mikaya Bunner Electronically Signed   By: Marca Anconaalton  Tonianne Fine M.D.   On: 01/29/2015 09:10    PHYSICAL EXAM General: NAD Neck: JVP 8 cm, no thyromegaly or thyroid nodule.  Lungs: Decreased breath sounds at bases bilaterally.  CV: Nondisplaced PMI.  Heart regular S1/S2, no S3/S4, no murmur.  1+ edema to knee on left, trace edema on right. No carotid bruit.  Normal pedal pulses.  Abdomen: Soft, nontender, no hepatosplenomegaly, no distention.  Neurologic: Alert and oriented x 3.  Psych: Normal affect. Extremities: No clubbing or cyanosis. Lesions on toes noted.   TELEMETRY: Reviewed telemetry pt in NSR  ASSESSMENT AND PLAN: 65 yo with history of DM and smoking but has not had medical care in decades presented with acute systolic CHF.  Echo showed EF 25-30% with apical thrombus and LHC showed 3VD.  1. Acute systolic CHF: EF 16-10%25-30% with regional WMAs, ischemic cardiomyopathy.  Now s/p CABG x 4.  Milrinone stopped over weekend.  Suspect mild volume overload. - Continue IV Lasix 20 mg bid today, follow response (may need higher dose).  Will follow CVP. - With pre-op low output, will start digoxin 0.125 daily.  Follow co-ox.  - Continue current Coreg.  Think we can add back low dose lisinopril 2.5 daily today as well as spironolactone 12.5 daily.  2. CAD: Diffuse 3 vessel disease, see cath report above. MRI reviewed: LAD territory did not appear to have significant viability.  Remainder of LV myocardium did appear viable.  He had severe disease in LCx and RCA territory  that may benefit from revascularization but suspect LAD territory will not recover.  He had CABG x 4 on 1/6, stable post-operatively so far.  - Continue ASA, atorvastatin 80 daily 3. LV mural thrombus: Will discuss with Dr Donata ClayVan Trigt but suspect we can resume heparin with overlapping coumadin until INR > 2.  4. Diabetes: Continue insulin.  5. Foot lesions: WOC consult appreciated. Continue dry dressing.  6. Diabetic neuropathy: Symptomatic neuropathy.  Added gabapentin 300 mg bid.   Carman Essick [employer]  02/04/2015 12:11 PM

## 2015-02-04 NOTE — Progress Notes (Signed)
      [address]       [place] [id number]             [phone]      Evening Rounds  Up in chair  BP 116/79 mmHg  Pulse 73  Temp(Src) 97.6 F (36.4 C) (Axillary)  Resp 16  Ht 6\' 1"  (1.854 m)  Wt 233 lb 7.5 oz (105.9 kg)  BMI 30.81 kg/m2  SpO2 98%   Intake/Output Summary (Last 24 hours) at 02/04/15 1711 Last data filed at 02/04/15 1400  Gross per 24 hour  Intake    600 ml  Output    700 ml  Net   -100 ml    No PM labs CBG 140-150 range  Continue current care  Kannen Moxey C. Dorris FetchHendrickson, MD [facility] [phone]

## 2015-02-04 NOTE — Plan of Care (Signed)
Not eating, also refuses nutritional supplement. Refuses to walk. Stood at bedside with PT.  Needs much encouragement, family supportive.

## 2015-02-04 NOTE — Progress Notes (Signed)
Physical Therapy Treatment Patient Details Name: Bradley PilaDavid M Racette MRN: [id number] DOB: 01-Apr-1950 Today's Date: 02/04/2015    History of Present Illness 65 y/o male with diabetes but currently does not go to the doctor or take any medications [facility]/2015 complaining of shortness of breath. In the emergency department he was noted to be profoundly hypoxemic, requiring BiPAP for respiratory assistance, he had an elevated lactic acid. He was given antibiotics for presumed sepsis.Tentative plan for CABG 02/01/2015    PT Comments    Pt participated well today after much encouragement used to bolster his courage.  Treatment focused on LE strengthening with exercise and standing activity with marching in place.  Follow Up Recommendations  SNF     Equipment Recommendations  Other (comment) (TBA)    Recommendations for Other Services       Precautions / Restrictions Precautions Precautions: Fall    Mobility  Bed Mobility Overal bed mobility: Needs Assistance                Transfers Overall transfer level: Needs assistance   Transfers: Sit to/from Stand Sit to Stand: Min assist         General transfer comment: cues for standing under sternal precautions.  Assisted coming forward with a minimal assist to come up.  Ambulation/Gait             General Gait Details: muched in place about 60 steps with minimal stability assist.  pt agreed to work with me if he didn't have to walk.   Stairs            Wheelchair Mobility    Modified Rankin (Stroke Patients Only)       Balance Overall balance assessment: Needs assistance Sitting-balance support: No upper extremity supported Sitting balance-Leahy Scale: Good     Standing balance support: Bilateral upper extremity supported;Single extremity supported Standing balance-Leahy Scale: Poor Standing balance comment: reliant on UE's even if only due to anxiety                     Cognition Arousal/Alertness: Awake/alert Behavior During Therapy: WFL for tasks assessed/performed;Anxious Overall Cognitive Status: Impaired/Different from baseline           Safety/Judgement: Decreased awareness of safety          Exercises General Exercises - Lower Extremity Ankle Circles/Pumps: AROM;15 reps;Seated Heel Slides: AROM;Strengthening;Both;10 reps;Seated (resisted in extention) Hip Flexion/Marching: AROM;Both;10 reps;Seated Toe Raises: AROM;10 reps;Seated Heel Raises: AROM;10 reps;Seated Other Exercises Other Exercises: isolated biceps and triceps bil x 10 reps with resistance    General Comments        Pertinent Vitals/Pain Pain Assessment: Faces Faces Pain Scale: Hurts little more Pain Location: chest, neck Pain Descriptors / Indicators: Aching;Heaviness;Grimacing Pain Intervention(s): Monitored during session    Home Living                      Prior Function            PT Goals (current goals can now be found in the care plan section) Acute Rehab PT Goals Patient Stated Goal: To get home, breathe better PT Goal Formulation: With patient Time For Goal Achievement: 02/13/15 Potential to Achieve Goals: Good Progress towards PT goals: Progressing toward goals    Frequency  Min 3X/week    PT Plan Current plan remains appropriate    Co-evaluation  End of Session   Activity Tolerance: Patient tolerated treatment well Patient left: in chair;with call bell/phone within reach     Time: 1610-9604 PT Time Calculation (min) (ACUTE ONLY): 24 min  Charges:  $Therapeutic Exercise: 8-22 mins $Therapeutic Activity: 8-22 mins                    G Codes:      Jamayia Croker, Eliseo Gum 02/04/2015, 4:58 PM 02/04/2015  Taholah Bing, PT [phone]  (pager)

## 2015-02-05 ENCOUNTER — Inpatient Hospital Stay (HOSPITAL_COMMUNITY): Payer: PPO

## 2015-02-05 ENCOUNTER — Ambulatory Visit (HOSPITAL_COMMUNITY): Payer: PPO

## 2015-02-05 DIAGNOSIS — R52 Pain, unspecified: Secondary | ICD-10-CM

## 2015-02-05 DIAGNOSIS — Z951 Presence of aortocoronary bypass graft: Secondary | ICD-10-CM

## 2015-02-05 DIAGNOSIS — I509 Heart failure, unspecified: Secondary | ICD-10-CM

## 2015-02-05 LAB — CARBOXYHEMOGLOBIN
Carboxyhemoglobin: 1.2 % (ref 0.5–1.5)
Carboxyhemoglobin: 1.2 % (ref 0.5–1.5)
Carboxyhemoglobin: 1.5 % (ref 0.5–1.5)
Carboxyhemoglobin: 1.4 % (ref 0.5–1.5)
Methemoglobin: 1 % (ref 0.0–1.5)
Methemoglobin: 1.1 % (ref 0.0–1.5)
Methemoglobin: 0.8 % (ref 0.0–1.5)
Methemoglobin: 0.7 % (ref 0.0–1.5)
O2 Saturation: 52.5 %
O2 Saturation: 47.3 %
O2 Saturation: 53 %
O2 Saturation: 53.9 %
Total hemoglobin: 9.3 g/dL — ABNORMAL LOW (ref 13.5–18.0)
Total hemoglobin: 9 g/dL — ABNORMAL LOW (ref 13.5–18.0)
Total hemoglobin: 9.2 g/dL — ABNORMAL LOW (ref 13.5–18.0)
Total hemoglobin: 9.2 g/dL — ABNORMAL LOW (ref 13.5–18.0)

## 2015-02-05 LAB — BASIC METABOLIC PANEL
Anion gap: 9 (ref 5–15)
Anion gap: 11 (ref 5–15)
BUN: 32 mg/dL — ABNORMAL HIGH (ref 6–20)
BUN: 32 mg/dL — ABNORMAL HIGH (ref 6–20)
CO2: 24 mmol/L (ref 22–32)
CO2: 23 mmol/L (ref 22–32)
Calcium: 8.5 mg/dL — ABNORMAL LOW (ref 8.9–10.3)
Calcium: 8.4 mg/dL — ABNORMAL LOW (ref 8.9–10.3)
Chloride: 104 mmol/L (ref 101–111)
Chloride: 104 mmol/L (ref 101–111)
Creatinine, Ser: 1.22 mg/dL (ref 0.61–1.24)
Creatinine, Ser: 1.21 mg/dL (ref 0.61–1.24)
GFR calc Af Amer: >60 mL/min (ref >60)
GFR calc Af Amer: >60 mL/min (ref >60)
GFR calc non Af Amer: >60 mL/min (ref >60)
GFR calc non Af Amer: >60 mL/min (ref >60)
Glucose, Bld: 158 mg/dL — ABNORMAL HIGH (ref 65–99)
Glucose, Bld: 199 mg/dL — ABNORMAL HIGH (ref 65–99)
Potassium: 4.3 mmol/L (ref 3.5–5.1)
Potassium: 4 mmol/L (ref 3.5–5.1)
Sodium: 137 mmol/L (ref 135–145)
Sodium: 138 mmol/L (ref 135–145)

## 2015-02-05 LAB — POCT I-STAT 3, ART BLOOD GAS (G3+)
ACID-BASE DEFICIT: 7 mmol/L — AB (ref 0.0–2.0)
ACID-BASE DEFICIT: 1 mmol/L (ref 0.0–2.0)
Acid-base deficit: 2 mmol/L (ref 0.0–2.0)
BICARBONATE: 15.9 meq/L — AB (ref 20.0–24.0)
BICARBONATE: 22.7 meq/L (ref 20.0–24.0)
Bicarbonate: 22.6 mEq/L (ref 20.0–24.0)
Bicarbonate: 22.6 mEq/L (ref 20.0–24.0)
O2 SAT: 98 %
O2 SAT: 97 %
O2 SAT: 92 %
O2 Saturation: 97 %
PCO2 ART: 37.9 mmHg (ref 35.0–45.0)
PO2 ART: 94 mmHg (ref 80.0–100.0)
PO2 ART: 94 mmHg (ref 80.0–100.0)
PO2 ART: 74 mmHg — AB (ref 80.0–100.0)
TCO2: 17 mmol/L (ref 0–100)
TCO2: 24 mmol/L (ref 0–100)
TCO2: 24 mmol/L (ref 0–100)
TCO2: 24 mmol/L (ref 0–100)
pCO2 arterial: 24.6 mmHg — ABNORMAL LOW (ref 35.0–45.0)
pCO2 arterial: 29 mmHg — ABNORMAL LOW (ref 35.0–45.0)
pCO2 arterial: 30.4 mmHg — ABNORMAL LOW (ref 35.0–45.0)
pH, Arterial: 7.419 (ref 7.350–7.450)
pH, Arterial: 7.382 (ref 7.350–7.450)
pH, Arterial: 7.499 — ABNORMAL HIGH (ref 7.350–7.450)
pH, Arterial: 7.477 — ABNORMAL HIGH (ref 7.350–7.450)
pO2, Arterial: 57 mmHg — ABNORMAL LOW (ref 80.0–100.0)

## 2015-02-05 LAB — GLUCOSE, CAPILLARY
GLUCOSE-CAPILLARY: 156 mg/dL — AB (ref 65–99)
Glucose-Capillary: 213 mg/dL — ABNORMAL HIGH (ref 65–99)
Glucose-Capillary: 192 mg/dL — ABNORMAL HIGH (ref 65–99)
Glucose-Capillary: 94 mg/dL (ref 65–99)

## 2015-02-05 LAB — CBC
HCT: 26.3 % — ABNORMAL LOW (ref 39.0–52.0)
HCT: 26.2 % — ABNORMAL LOW (ref 39.0–52.0)
Hemoglobin: 8.9 g/dL — ABNORMAL LOW (ref 13.0–17.0)
Hemoglobin: 8.9 g/dL — ABNORMAL LOW (ref 13.0–17.0)
MCH: 28.4 pg (ref 26.0–34.0)
MCH: 28.2 pg (ref 26.0–34.0)
MCHC: 33.8 g/dL (ref 30.0–36.0)
MCHC: 34 g/dL (ref 30.0–36.0)
MCV: 84 fL (ref 78.0–100.0)
MCV: 82.9 fL (ref 78.0–100.0)
Platelets: 213 10*3/uL (ref 150–400)
Platelets: 220 10*3/uL (ref 150–400)
RBC: 3.13 MIL/uL — ABNORMAL LOW (ref 4.22–5.81)
RBC: 3.16 MIL/uL — ABNORMAL LOW (ref 4.22–5.81)
RDW: 13.9 % (ref 11.5–15.5)
RDW: 13.6 % (ref 11.5–15.5)
WBC: 10.2 10*3/uL (ref 4.0–10.5)
WBC: 11.9 10*3/uL — ABNORMAL HIGH (ref 4.0–10.5)

## 2015-02-05 LAB — HEPARIN LEVEL (UNFRACTIONATED): Heparin Unfractionated: 0.27 IU/mL — ABNORMAL LOW (ref 0.30–0.70)

## 2015-02-05 LAB — MAGNESIUM: Magnesium: 2.5 mg/dL — ABNORMAL HIGH (ref 1.7–2.4)

## 2015-02-05 MED ORDER — LIDOCAINE IN D5W 4-5 MG/ML-% IV SOLN: 1.0000 mg/min | INTRAVENOUS | Status: DC

## 2015-02-05 MED ORDER — ALTEPLASE 2 MG IJ SOLR
2.0000 mg | Freq: Once | INTRAMUSCULAR | Status: AC
  Administered 2015-02-05: 2 mg
  Filled 2015-02-05: qty 2

## 2015-02-05 MED ORDER — NOREPINEPHRINE BITARTRATE 1 MG/ML IV SOLN: 2.0000 ug/min | INTRAVENOUS | Status: DC

## 2015-02-05 MED ORDER — ALBUMIN HUMAN 5 % IV SOLN
INTRAVENOUS | Status: AC
  Administered 2015-02-05: 07:00:00
  Filled 2015-02-05: qty 250

## 2015-02-05 MED ORDER — AMIODARONE HCL IN DEXTROSE 360-4.14 MG/200ML-% IV SOLN
60.0000 mg/h | INTRAVENOUS | Status: AC
  Administered 2015-02-05 (×2): 60 mg/h via INTRAVENOUS

## 2015-02-05 MED ORDER — HEPARIN (PORCINE) IN NACL 100-0.45 UNIT/ML-% IJ SOLN
1800.0000 [IU]/h | INTRAMUSCULAR | Status: DC
  Administered 2015-02-05: 1400 [IU]/h via INTRAVENOUS
  Administered 2015-02-06 – 2015-02-07 (×3): 1800 [IU]/h via INTRAVENOUS
  Filled 2015-02-05 (×9): qty 250

## 2015-02-05 MED ORDER — FUROSEMIDE 10 MG/ML IJ SOLN
20.0000 mg | Freq: Two times a day (BID) | INTRAMUSCULAR | Status: DC
  Administered 2015-02-05 – 2015-02-06 (×3): 20 mg via INTRAVENOUS
  Filled 2015-02-05 (×3): qty 2

## 2015-02-05 MED ORDER — AMIODARONE HCL IN DEXTROSE 360-4.14 MG/200ML-% IV SOLN
30.0000 mg/h | INTRAVENOUS | Status: DC
  Administered 2015-02-05 – 2015-02-07 (×5): 30 mg/h via INTRAVENOUS
  Filled 2015-02-05 (×7): qty 200

## 2015-02-05 MED ORDER — MORPHINE SULFATE (PF) 2 MG/ML IV SOLN
INTRAVENOUS | Status: AC
  Administered 2015-02-05: 1 mg via INTRAMUSCULAR
  Filled 2015-02-05: qty 1

## 2015-02-05 MED ORDER — NOREPINEPHRINE BITARTRATE 1 MG/ML IV SOLN
0.0000 ug/min | INTRAVENOUS | Status: DC
  Administered 2015-02-05: 5 ug/min via INTRAVENOUS
  Filled 2015-02-05: qty 16

## 2015-02-05 MED ORDER — MILRINONE IN DEXTROSE 20 MG/100ML IV SOLN
0.2000 ug/kg/min | INTRAVENOUS | Status: DC
  Administered 2015-02-05: 0.2 ug/kg/min via INTRAVENOUS
  Administered 2015-02-05: 0.375 ug/kg/min via INTRAVENOUS
  Filled 2015-02-05 (×3): qty 100

## 2015-02-05 MED FILL — Mannitol IV Soln 20%: INTRAVENOUS | Qty: 500 | Status: AC

## 2015-02-05 MED FILL — Sodium Bicarbonate IV Soln 8.4%: INTRAVENOUS | Qty: 50 | Status: AC

## 2015-02-05 MED FILL — Lidocaine HCl IV Inj 20 MG/ML: INTRAVENOUS | Qty: 10 | Status: AC

## 2015-02-05 MED FILL — Electrolyte-R (PH 7.4) Solution: INTRAVENOUS | Qty: 4000 | Status: AC

## 2015-02-05 MED FILL — Heparin Sodium (Porcine) Inj 1000 Unit/ML: INTRAMUSCULAR | Qty: 30 | Status: AC

## 2015-02-05 MED FILL — Sodium Chloride IV Soln 0.9%: INTRAVENOUS | Qty: 3000 | Status: AC

## 2015-02-05 NOTE — Progress Notes (Signed)
*  PRELIMINARY RESULTS* Vascular Ultrasound Right Upper Extremity Arterial Duplex has been completed.  All right upper extremity arteries were visualized and found to be patent with triphasic flow.  02/05/2015 5:32 PM Gertie FeyMichelle Zahara Rembert, RVT, RDCS, RDMS

## 2015-02-05 NOTE — Progress Notes (Addendum)
Patient ID: Bradley Benitez, male   DOB: 1950-07-03, 65 y.o.   MRN: [id number]   65 yo with history of DM and smoking but has not had medical care in decades presented with acute systolic CHF.  Echo showed EF 25-30% with apical thrombus and LHC showed 3VD.    LHC/RHC (12/16):  RA mean 11 PA 35/11 PCWP mean 23 CI 2.59   Ost RPDA lesion, 70% stenosed.  1st RPLB lesion, 80% stenosed.  Mid Cx lesion, 80% stenosed.  Prox LAD lesion, 60% stenosed.  Mid LAD lesion, 95% stenosed.  Ost 2nd Diag lesion, 70% stenosed.  Ost Ramus to Ramus lesion, 90% stenosed  Cardiac MRI (1/17): 1. Severe LV systolic dysfunction, EF 26%. Wall motion abnormalities as above. 2. Normal RV size and systolic function . 3. LGE pattern suggestive of infarction in LAD territory. The mid anterior/anteroseptal and apical anterior/septal/inferior wall segments and the true apex do not appear viable (probably not likely to improve function with revascularization). The remainder of the left ventricle appears viable. 4. There is a small LV apical thrombus.  CABG x 4 on 1/6.   This morning had ventricular fibrillation, received total of 9 shocks and ended up on amiodarone and lidocaine gtts.  Norepinephrine was started due to hypotension.  Patient was conscious at end of code.   Scheduled Meds: . acetaminophen  1,000 mg Oral 4 times per day  . albumin human      . aspirin EC  325 mg Oral Daily  . atorvastatin  80 mg Oral q1800  . bisacodyl  10 mg Oral Daily   Or  . bisacodyl  10 mg Rectal Daily  . carvedilol  3.125 mg Oral BID WC  . digoxin  0.125 mg Oral Daily  . docusate sodium  200 mg Oral Daily  . enoxaparin (LOVENOX) injection  30 mg Subcutaneous Q24H  . feeding supplement (GLUCERNA SHAKE)  237 mL Oral TID BM  . insulin aspart  0-15 Units Subcutaneous TID WC  . insulin detemir  20 Units Subcutaneous Daily  . levalbuterol  0.63 mg Nebulization TID  . morphine      . pantoprazole  40 mg Oral  Daily  . sodium chloride  3 mL Intravenous Q12H  . sodium chloride  3 mL Intravenous Q12H   Continuous Infusions: . sodium chloride Stopped (02/03/15 1100)   PRN Meds:.sodium chloride, metoprolol, ondansetron (ZOFRAN) IV, oxyCODONE, sodium chloride, sodium chloride, traMADol    Filed Vitals:   02/05/15 0346 02/05/15 0400 02/05/15 0500 02/05/15 0600  BP:  112/72 91/60 110/63  Pulse:      Temp: 97.5 F (36.4 C)     TempSrc: Oral     Resp:  19 15 20   Height:      Weight:    234 lb 12.6 oz (106.5 kg)  SpO2:  98%      Intake/Output Summary (Last 24 hours) at 02/05/15 0732 Last data filed at 02/04/15 2137  Gross per 24 hour  Intake    600 ml  Output    400 ml  Net    200 ml    LABS: Basic Metabolic Panel:  Recent Labs  40/98/11 1700  02/04/15 0346 02/05/15 0348  NA  --   < > 137 137  K  --   < > 4.2 4.3  CL  --   < > 105 104  CO2  --   < > 25 24  GLUCOSE  --   < > 152*  158*  BUN  --   < > 27* 32*  CREATININE 1.54*  < > 1.32* 1.22  CALCIUM  --   < > 8.3* 8.5*  MG 2.4  --   --   --   < > = values in this interval not displayed. Liver Function Tests: No results for input(s): AST, ALT, ALKPHOS, BILITOT, PROT, ALBUMIN in the last 72 hours. No results for input(s): LIPASE, AMYLASE in the last 72 hours. CBC:  Recent Labs  02/04/15 0346 02/05/15 0348  WBC 9.9 10.2  HGB 8.9* 8.9*  HCT 26.7* 26.3*  MCV 83.7 84.0  PLT 141* 213   Cardiac Enzymes: No results for input(s): CKTOTAL, CKMB, CKMBINDEX, TROPONINI in the last 72 hours. BNP: Invalid input(s): POCBNP D-Dimer: No results for input(s): DDIMER in the last 72 hours. Hemoglobin A1C: No results for input(s): HGBA1C in the last 72 hours. Fasting Lipid Panel: No results for input(s): CHOL, HDL, LDLCALC, TRIG, CHOLHDL, LDLDIRECT in the last 72 hours. Thyroid Function Tests: No results for input(s): TSH, T4TOTAL, T3FREE, THYROIDAB in the last 72 hours.  Invalid input(s): FREET3 Anemia Panel: No results for  input(s): VITAMINB12, FOLATE, FERRITIN, TIBC, IRON, RETICCTPCT in the last 72 hours.  RADIOLOGY: Dg Chest 2 View  02/04/2015  CLINICAL DATA:  Acute respiratory failure, CHF, previous MI, former smoker. EXAM: CHEST  2 VIEW COMPARISON:  Portable chest x-ray of February 03, 2015 FINDINGS: The lungs are borderline hypoinflated. There is a trace of pleural fluid on the right. Right basilar atelectasis or pneumonia has improved. There remains retrocardiac subsegmental atelectasis but it has improved as well. The cardiac silhouette remains enlarged. The pulmonary vascularity is less engorged. There are post CABG changes. The right-sided PICC line tip projects over the distal third of the SVC. The right internal jugular Cordis sheath has been removed. IMPRESSION: Mild interval improvement in bibasilar atelectasis or pneumonia. Trace right pleural effusion remains. Stable mild cardiomegaly with decreased pulmonary vascular congestion. Electronically Signed   By: Esaias  [place] M.D.   On: 02/04/2015 08:04   Dg Chest 2 View  01/26/2015  CLINICAL DATA:  Shortness of breath this AM; h/o diabetes; former smoker EXAM: CHEST  2 VIEW COMPARISON:  01/25/2015 FINDINGS: Heart size is normal. There is prominence of interstitial markings, increased. Perihilar peribronchial thickening and noted. There are no focal consolidations. There are small bilateral pleural effusions. IMPRESSION: 1. Increased bronchitic changes. 2. Increased bilateral pleural effusions. Electronically Signed   By: Norva Pavlov M.D.   On: 01/26/2015 10:34   Dg Chest Port 1 View  02/03/2015  CLINICAL DATA:  Atelectasis EXAM: PORTABLE CHEST 1 VIEW COMPARISON:  Chest x-rays dated 02/02/2015 and 02/01/2015. FINDINGS: Left-sided chest tube is no longer seen. Swan-Ganz catheter is also no longer seen. Mediastinal drains have also been removed. Right IJ central line access site remains with tip projected over the upper SVC. Right-sided PICC line appears well  positioned with tip projected over the lower SVC. Cardiomediastinal silhouette is stable in size and configuration. Cardiomegaly is stable. Median sternotomy wires are intact and stable in alignment, presumably for CABG. There is mild central pulmonary vascular congestion. Ill-defined bibasilar airspace opacities are not not significantly changed, presumably atelectasis and/or small effusions. No new lung findings. No pneumothorax seen. IMPRESSION: 1. Multiple tubes and lines removed, as above. 2. Persistent mild central pulmonary vascular congestion without evidence of overt pulmonary edema. 3. Persistent ill-defined bibasilar airspace opacities, presumably atelectasis and/or small effusions. 4. No new lung findings. 5. Stable cardiomegaly.  Electronically Signed   By: Bary Richard M.D.   On: 02/03/2015 08:02   Dg Chest Port 1 View  02/02/2015  CLINICAL DATA:  65 year old male status post 4 vessel CABG EXAM: PORTABLE CHEST 1 VIEW COMPARISON:  Prior chest x-ray 02/01/2015 FINDINGS: The patient has been extubated and the nasogastric tube removed. A right IJ vascular sheath convey is a Swan-Ganz catheter into the heart. The tip of the Swan overlies the proximal right lower lobe pulmonary artery. A right upper extremity PICC is again noted in good position with the tip overlying the upper right atrium. Mediastinal and left thoracic drains are present via a subxiphoid approach. Patient is status post median sternotomy with evidence of multivessel CABG. Stable cardiomegaly. Mediastinal contours are grossly unchanged given lower inspiratory volumes. Lower inspiratory volumes with increased bibasilar opacities favored to reflect atelectasis. Pulmonary vascular congestion without overt edema. No evidence of pneumothorax or large effusion. No acute osseous abnormality. IMPRESSION: 1. Interval extubation and removal of nasogastric tube. 2. Lower inspiratory volumes with increasing bibasilar atelectasis. 3. Pulmonary  vascular congestion without overt edema. 4. Other support apparatus in stable and satisfactory position as above. Electronically Signed   By: Malachy Moan M.D.   On: 02/02/2015 08:54   Dg Chest Port 1 View  02/01/2015  CLINICAL DATA:  Patient status post CABG procedure. EXAM: PORTABLE CHEST 1 VIEW COMPARISON:  Chest radiograph 01/26/2015. FINDINGS: ET tube terminates in the mid trachea. Enteric tube courses inferior to the diaphragm. Right IJ approach pulmonary arterial catheter is present with tip projecting over the expected location of the right main pulmonary artery. Multiple mediastinal drains are present. Right upper extremity PICC line is present with tip projecting at the superior cavoatrial junction. Left chest tube in place. Stable cardiac and mediastinal contours status post median sternotomy and CABG procedure. No large area of pulmonary consolidation. No definite pleural effusion or pneumothorax. No aggressive or acute appearing osseous lesions. IMPRESSION: Support apparatus as above. Electronically Signed   By: Annia Belt M.D.   On: 02/01/2015 16:04   Dg Chest Port 1 View  01/25/2015  CLINICAL DATA:  Acute respiratory failure with hypoxemia.  Fever. EXAM: PORTABLE CHEST 1 VIEW COMPARISON:  01/23/2015. FINDINGS: Grossly stable enlarged cardiac silhouette. Prominent pulmonary vasculature and interstitial markings with improvement. Interval small amount of linear density in the right mid lung zone. Minimal right pleural fluid. Central peribronchial thickening. Unremarkable bones. IMPRESSION: 1. Improving changes of congestive heart failure. 2. Small amount of linear atelectasis in the right mid lung zone. 3. Moderate bronchitic changes. Electronically Signed   By: Beckie Salts M.D.   On: 01/25/2015 16:34   Dg Chest Port 1 View  01/23/2015  CLINICAL DATA:  Congestive heart failure EXAM: PORTABLE CHEST 1 VIEW COMPARISON:  None. FINDINGS: There is generalized interstitial edema with patchy  alveolar edema in the bases. Heart is enlarged with pulmonary venous hypertension. No adenopathy. IMPRESSION: Evidence of congestive heart failure with edema most pronounced in the lung bases. Electronically Signed   By: Bretta Bang III M.D.   On: 01/23/2015 09:34   Mr Card Morphology Wo/w Cm  01/29/2015  CLINICAL DATA:  CAD, assess for viability EXAM: CARDIAC MRI TECHNIQUE: The patient was scanned on a 1.5 Tesla GE magnet. A dedicated cardiac coil was used. Functional imaging was done using Fiesta sequences. 2,3, and 4 chamber views were done to assess for RWMA's. Modified Simpson's rule using a short axis stack was used to calculate an ejection fraction on a  dedicated work [employer]. The patient received 34 cc of Multihance. After 10 minutes inversion recovery sequences were used to assess for infiltration and scar tissue. CONTRAST:  34 cc Multihance FINDINGS: There were bilateral small to moderate pleural effusions. There was a trivial pericardial effusion. Normal left ventricular size. EF 26% with mid anteroseptal and mid anterior akinesis. The mid anterolateral wall was hypokinetic. The apical septal, inferior, and anterior segments were akinetic and the apical lateral segment was hypokinetic. The true apex was akinetic. A small apical thrombus was noted. The atria were normal in size. The right ventricle was normal in size and systolic function. The aortic valve was trileaflet with no stenosis or significant regurgitation. There did not appear to be significant mitral regurgitation. On delayed enhancement imaging, there was subendocardial 76-99% wall thickness late gadolinium enhancement (LGE) in the mid anteroseptal and mid anterior wall segments, in the apical septal/anterior/inferior wall segments, and in the true apex. Basal segments without LGE, anterolateral wall without significant LGE, and mid inferior wall without LGE. MEASUREMENTS: MEASUREMENTS LV EDV 212 mL LV SV 54 mL LV  EF 26% IMPRESSION: 1. Severe LV systolic dysfunction, EF 26%. Wall motion abnormalities as above. 2.  Normal RV size and systolic function . 3. LGE pattern suggestive of infarction in LAD territory. The mid anterior/anteroseptal and apical anterior/septal/inferior wall segments and the true apex do not appear viable (probably not likely to improve function with revascularization). The remainder of the left ventricle appears viable. 4.  There is a small LV apical thrombus. Dalton Mclean Electronically Signed   By: [name] M.D.   On: 01/29/2015 09:10    PHYSICAL EXAM General: venti mask Neck: JVP 8 cm, no thyromegaly or thyroid nodule.  Lungs: Decreased breath sounds at bases bilaterally.  CV: Nondisplaced PMI.  Heart regular S1/S2, no S3/S4, no murmur.  1+ edema to knee on left, trace edema on right. No carotid bruit.  Normal pedal pulses.  Abdomen: Soft, nontender, no hepatosplenomegaly, no distention.  Neurologic: Alert and oriented x 3.  Psych: Normal affect. Extremities: No clubbing or cyanosis. Lesions on toes noted.   TELEMETRY: Reviewed telemetry pt v-paced at end of code.   ASSESSMENT AND PLAN: 65 yo with history of DM and smoking but has not had medical care in decades presented with acute systolic CHF.  Echo showed EF 25-30% with apical thrombus and LHC showed 3VD. 1. Ventricular fibrillation arrest: 9 shocks total this morning, now on amiodarone and lidocaine gtts.  2. Acute systolic CHF: EF 16-10% with regional WMAs, ischemic cardiomyopathy.  Now s/p CABG x 4.  Milrinone stopped over weekend. He is now on norepinephrine for hypotension post-code.  He remains volume overloaded and will need more diuresis when stable.  - Hold BP-active meds for now. 3. CAD: Diffuse 3 vessel disease, see cath report above. MRI reviewed: LAD territory did not appear to have significant viability.  Remainder of LV myocardium did appear viable.  He had severe disease in LCx and RCA territory that may  benefit from revascularization but suspect LAD territory will not recover.  He had CABG x 4 on 1/6.  Suspect ventricular fibrillation arrest today is related poor LV function/scar as opposed to occlusion of graft, etc.  - Continue ASA, atorvastatin 80 daily.  - Will get ECG.  4. LV mural thrombus: Given mental illness, suspect he is going to be poor coumadin candidate and will avoid.  Thrombus was not seen on TEE at time of CABG.  5. Diabetes: Continue insulin.  6. Foot lesions: WOC consult appreciated. Continue dry dressing.  7. Diabetic neuropathy: Symptomatic neuropathy.  Added gabapentin 300 mg bid.   35 minutes critical care time.   [name] 02/05/2015 7:32 AM  Co-ox this afternoon low.  Will restart milrinone and taper down on norepinephrine.  Echo viewed at bedside.  EF unchanged, has mobile apical thrombus.  Discussed with Dr Donata Clay, to start heparin gtt.   [name] 02/05/2015 2:21 PM

## 2015-02-05 NOTE — Progress Notes (Signed)
ANTICOAGULATION CONSULT NOTE - Follow Up Consult  Pharmacy Consult for heparin Indication: LV thrombus  Allergies  Allergen Reactions  . Codeine Other (See Comments)    intolerance    Patient Measurements: Height: 6\' 1"  (185.4 cm) Weight: 234 lb 12.6 oz (106.5 kg) IBW/kg (Calculated) : 79.9 Heparin Dosing Weight: ~ 100 kg  Vital Signs: Temp: 97.6 F (36.4 C) (01/10 1944) Temp Source: Oral (01/10 1944) Pulse Rate: 88 (01/10 2000)  Labs:  Recent Labs  02/04/15 0346 02/05/15 0348 02/05/15 1300 02/05/15 2113  HGB 8.9* 8.9* 8.9*  --   HCT 26.7* 26.3* 26.2*  --   PLT 141* 213 220  --   HEPARINUNFRC  --   --   --  0.27*  CREATININE 1.32* 1.22 1.21  --     Estimated Creatinine Clearance: 76.9 mL/min (by C-G formula based on Cr of 1.21).   Medical History: Past Medical History  Diagnosis Date  . DM2 (diabetes mellitus, type 2) (HCC)   . Gangrene (HCC)     10+ years ago    Medications:  Infusions:  . sodium chloride 250 mL (02/05/15 0730)  . amiodarone 30 mg/hr (02/05/15 2055)  . heparin 1,400 Units/hr (02/05/15 1900)  . lidocaine 1 mg/min (02/05/15 0715)  . milrinone 0.375 mcg/kg/min (02/05/15 1820)  . norepinephrine (LEVOPHED) Adult infusion Stopped (02/05/15 1740)    Assessment: 65 yo male s/p CABG 4 days ago, found with LV thrombus. Currently on IV heparin at 1400 units/hr.  F/u HL is slightly sub-therapeutic at 0.27. RN reports no s/s of bleeding   Scr 1.21, CrCl ~ 80 ml/min  Goal of Therapy:  Heparin level 0.3-0.7 units/ml Monitor platelets by anticoagulation protocol: Yes   Plan:  1. Increase IV heparin to 1600 units/hr.  Won't bolus given fairly recent CABG 2. Check f/u heparin level with AM labs  3. Daily heparin level and CBC. 4. F/u plans for oral anticoagulation as able.  Vinnie LevelBenjamin Catlynn Grondahl, PharmD., BCPS Clinical Pharmacist Pager [phone]

## 2015-02-05 NOTE — Progress Notes (Signed)
4 Days Post-Op Procedure(s) (LRB): CORONARY ARTERY BYPASS GRAFTING (CABG)x 4 using left internal mammary artery and right greater saphenous leg vein using endoscope. (N/A) TRANSESOPHAGEAL ECHOCARDIOGRAM (TEE) (N/A)  CENTRIMAG VENTRICULAR ASSIST DEVICE, back up (N/A) Subjective: Episode of sudden VF - prob scar mediated shocked x 8, on iv amio and lidocaine Neuro intact  Objective: Vital signs in last 24 hours: Temp:  [97.4 F (36.3 C)-97.8 F (36.6 C)] 97.5 F (36.4 C) (01/10 0346) Pulse Rate:  [65-76] 66 (01/09 2300) Cardiac Rhythm:  [-] Normal sinus rhythm (01/09 2000) Resp:  [10-23] 20 (01/10 0600) BP: (85-130)/(57-83) 110/63 mmHg (01/10 0600) SpO2:  [95 %-99 %] 98 % (01/10 0400) Weight:  [234 lb 12.6 oz (106.5 kg)] 234 lb 12.6 oz (106.5 kg) (01/10 0600)  Hemodynamic parameters for last 24 hours: CVP:  [9 mmHg-67 mmHg] 61 mmHg  Intake/Output from previous day: 01/09 0701 - 01/10 0700 In: 600 [P.O.:600] Out: 400 [Urine:400] Intake/Output this shift:    Answers questions Mild-mod edema AV paced  Lab Results:  Recent Labs  02/04/15 0346 02/05/15 0348  WBC 9.9 10.2  HGB 8.9* 8.9*  HCT 26.7* 26.3*  PLT 141* 213   BMET:  Recent Labs  02/04/15 0346 02/05/15 0348  NA 137 137  K 4.2 4.3  CL 105 104  CO2 25 24  GLUCOSE 152* 158*  BUN 27* 32*  CREATININE 1.32* 1.22  CALCIUM 8.3* 8.5*    PT/INR: No results for input(s): LABPROT, INR in the last 72 hours. ABG    Component Value Date/Time   PHART 7.419 02/05/2015 0726   HCO3 15.9* 02/05/2015 0726   TCO2 17 02/05/2015 0726   ACIDBASEDEF 7.0* 02/05/2015 0726   O2SAT 98.0 02/05/2015 0726   CBG (last 3)   Recent Labs  02/04/15 1211 02/04/15 1539 02/04/15 2136  GLUCAP 155* 151* 139*    Assessment/Plan: S/P Procedure(s) (LRB): CORONARY ARTERY BYPASS GRAFTING (CABG)x 4 using left internal mammary artery and right greater saphenous leg vein using endoscope. (N/A) TRANSESOPHAGEAL ECHOCARDIOGRAM (TEE)  (N/A)  CENTRIMAG VENTRICULAR ASSIST DEVICE, back up (N/A) Cont current care Check echo and Co-ox  LOS: 13 days    Bradley Benitez 02/05/2015

## 2015-02-05 NOTE — Code Documentation (Signed)
CODE BLUE NOTE  Patient Name: Oliver PilaDavid M Dowdell   MRN: [id number]   Date of Birth/ Sex: 08-15-1950 , male      Admission Date: 01/23/2015  Attending Provider: Kerin PernaPeter Van Trigt, MD  Primary Diagnosis: Acute systolic congestive heart failure, NYHA class 4 (HCC)    Indication: Pt was in his usual state of health until this AM, when he was noted to be in VT. Code blue was subsequently called. At the time of arrival on scene, ACLS protocol was underway.    Technical Description:  - CPR performance duration:  About 25  minutes  - Was defibrillation or cardioversion used? Yes x 9  - Was external pacer placed? Yes  - Was patient intubated pre/post CPR? Yes    Medications Administered: Y = Yes; Blank = No Amiodarone  300 mg twice  Atropine    Calcium    Epinephrine    Lidocaine    Magnesium  2 gm once  Norepinephrine    Phenylephrine    Sodium bicarbonate    Vasopressin      Post CPR evaluation:  - Final Status - Was patient successfully resuscitated ? Yes - What is current rhythm? On pacemaker - What is current hemodynamic status? Hypotensive. Started on levophed   Miscellaneous Information:  - Labs sent, including: ABG, BMP, Mg, CXR, EKG  - Primary team notified?  Yes  - Family Notified? No  - Additional notes/ transfer status: none   Almon Herculesaye T Autumn Gunn, MD  02/05/2015, 7:24 AM

## 2015-02-05 NOTE — Progress Notes (Signed)
PT Cancellation Note  Patient Details Name: Bradley PilaDavid M Benitez MRN: [id number] DOB: 04-16-1950   Cancelled Treatment:    Reason Eval/Treat Not Completed: Medical issues which prohibited therapy.  Pt went into Vfib this morning, needing 9 shocks to recover.  Will defer today and see 1/11 as able. 02/05/2015  [place] [name], PT [phone]  (pager)   [name], [name] 02/05/2015, 12:34 PM

## 2015-02-05 NOTE — Progress Notes (Signed)
  Echocardiogram 2D Echocardiogram has been performed.  Janalyn HarderWest, Kyley Solow R 02/05/2015, 2:36 PM

## 2015-02-05 NOTE — Progress Notes (Signed)
ANTICOAGULATION CONSULT NOTE - Initial Consult  Pharmacy Consult for heparin Indication: LV thrombus  Allergies  Allergen Reactions  . Codeine Other (See Comments)    intolerance    Patient Measurements: Height: 6\' 1"  (185.4 cm) Weight: 234 lb 12.6 oz (106.5 kg) IBW/kg (Calculated) : 79.9 Heparin Dosing Weight: ~ 100 kg  Vital Signs: Temp: 97.5 F (36.4 C) (01/10 1217) Temp Source: Oral (01/10 1217) BP: 94/72 mmHg (01/10 0735) Pulse Rate: 87 (01/10 1300)  Labs:  Recent Labs  02/04/15 0346 02/05/15 0348 02/05/15 1300  HGB 8.9* 8.9* 8.9*  HCT 26.7* 26.3* 26.2*  PLT 141* 213 220  CREATININE 1.32* 1.22 1.21    Estimated Creatinine Clearance: 76.9 mL/min (by C-G formula based on Cr of 1.21).   Medical History: Past Medical History  Diagnosis Date  . DM2 (diabetes mellitus, type 2) (HCC)   . Gangrene (HCC)     10+ years ago    Medications:  Infusions:  . sodium chloride Stopped (02/03/15 1100)  . amiodarone    . heparin    . lidocaine 1 mg/min (02/05/15 0715)  . milrinone 0.375 mcg/kg/min (02/05/15 1327)  . norepinephrine (LEVOPHED) Adult infusion 3 mcg/min (02/05/15 1328)    Assessment: 65 yo male s/p CABG 4 days ago, found with LV thrombus.  Pharmacy asked to begin IV heparin today.  Received Lovenox 30 mg this AM at 0944.  Scr 1.21, CrCl ~ 80 ml/min  Goal of Therapy:  Heparin level 0.3-0.7 units/ml Monitor platelets by anticoagulation protocol: Yes   Plan:  1. Start IV heparin at 1400 units/hr.  Won't bolus given fairly recent CABG and Lovenox dose this AM. 2. Check heparin level 6 hrs after gtt starts. 3. Daily heparin level and CBC. 4. F/u plans for oral anticoagulation as able.  Tad MooreJessica Chelsea Nusz, Pharm D, BCPS  Clinical Pharmacist Pager [phone]  02/05/2015 2:42 PM

## 2015-02-05 NOTE — Progress Notes (Signed)
CT surgery p.m. Rounds  Patient resting comfortably No more ventricular arrhythmias today Oxygen FiO2 slowly weaned Last mixed venous saturation 55% on low-dose milrinone Echocardiogram shows stable  but significant LV dysfunction with old large apical anterior scar which is probably responsible for the ventricular arrhythmias  We'll mobilize out of bed tomorrow

## 2015-02-06 ENCOUNTER — Inpatient Hospital Stay (HOSPITAL_COMMUNITY): Payer: PPO

## 2015-02-06 DIAGNOSIS — I4901 Ventricular fibrillation: Secondary | ICD-10-CM

## 2015-02-06 LAB — BASIC METABOLIC PANEL
Anion gap: 9 (ref 5–15)
BUN: 31 mg/dL — ABNORMAL HIGH (ref 6–20)
CO2: 23 mmol/L (ref 22–32)
Calcium: 8.1 mg/dL — ABNORMAL LOW (ref 8.9–10.3)
Chloride: 105 mmol/L (ref 101–111)
Creatinine, Ser: 1.2 mg/dL (ref 0.61–1.24)
GFR calc Af Amer: >60 mL/min (ref >60)
GFR calc non Af Amer: >60 mL/min (ref >60)
Glucose, Bld: 115 mg/dL — ABNORMAL HIGH (ref 65–99)
Potassium: 3.7 mmol/L (ref 3.5–5.1)
Sodium: 137 mmol/L (ref 135–145)

## 2015-02-06 LAB — POCT I-STAT 3, ART BLOOD GAS (G3+)
Acid-Base Excess: 2 mmol/L (ref 0.0–2.0)
Acid-base deficit: 1 mmol/L (ref 0.0–2.0)
Bicarbonate: 24.9 mEq/L — ABNORMAL HIGH (ref 20.0–24.0)
Bicarbonate: 22.5 mEq/L (ref 20.0–24.0)
O2 SAT: 97 %
O2 Saturation: 98 %
PCO2 ART: 32.4 mmHg — AB (ref 35.0–45.0)
PO2 ART: 80 mmHg (ref 80.0–100.0)
Patient temperature: 98.2
Patient temperature: 97.8
TCO2: 26 mmol/L (ref 0–100)
TCO2: 24 mmol/L (ref 0–100)
pCO2 arterial: 32 mmHg — ABNORMAL LOW (ref 35.0–45.0)
pH, Arterial: 7.499 — ABNORMAL HIGH (ref 7.350–7.450)
pH, Arterial: 7.449 (ref 7.350–7.450)
pO2, Arterial: 89 mmHg (ref 80.0–100.0)

## 2015-02-06 LAB — GLUCOSE, CAPILLARY
GLUCOSE-CAPILLARY: 128 mg/dL — AB (ref 65–99)
GLUCOSE-CAPILLARY: 118 mg/dL — AB (ref 65–99)
GLUCOSE-CAPILLARY: 128 mg/dL — AB (ref 65–99)
Glucose-Capillary: 157 mg/dL — ABNORMAL HIGH (ref 65–99)

## 2015-02-06 LAB — CBC
HCT: 25.8 % — ABNORMAL LOW (ref 39.0–52.0)
Hemoglobin: 8.8 g/dL — ABNORMAL LOW (ref 13.0–17.0)
MCH: 28.1 pg (ref 26.0–34.0)
MCHC: 34.1 g/dL (ref 30.0–36.0)
MCV: 82.4 fL (ref 78.0–100.0)
Platelets: 271 10*3/uL (ref 150–400)
RBC: 3.13 MIL/uL — ABNORMAL LOW (ref 4.22–5.81)
RDW: 13.6 % (ref 11.5–15.5)
WBC: 10 10*3/uL (ref 4.0–10.5)

## 2015-02-06 LAB — HEPARIN LEVEL (UNFRACTIONATED)
Heparin Unfractionated: 0.29 IU/mL — ABNORMAL LOW (ref 0.30–0.70)
Heparin Unfractionated: 0.39 IU/mL (ref 0.30–0.70)
Heparin Unfractionated: 0.37 IU/mL (ref 0.30–0.70)

## 2015-02-06 LAB — CARBOXYHEMOGLOBIN
Carboxyhemoglobin: 1.1 % (ref 0.5–1.5)
Carboxyhemoglobin: 1.5 % (ref 0.5–1.5)
Methemoglobin: 0.9 % (ref 0.0–1.5)
Methemoglobin: 0.8 % (ref 0.0–1.5)
O2 Saturation: 55 %
O2 Saturation: 63.6 %
Total hemoglobin: 14.9 g/dL (ref 13.5–18.0)
Total hemoglobin: 8.7 g/dL — ABNORMAL LOW (ref 13.5–18.0)

## 2015-02-06 MED ORDER — SUCRALFATE 1 G PO TABS
1.0000 g | ORAL_TABLET | Freq: Three times a day (TID) | ORAL | Status: DC
  Administered 2015-02-07 – 2015-02-15 (×28): 1 g via ORAL
  Filled 2015-02-06 (×34): qty 1

## 2015-02-06 MED ORDER — AMIODARONE HCL IN DEXTROSE 360-4.14 MG/200ML-% IV SOLN
30.0000 mg/h | INTRAVENOUS | Status: DC
  Administered 2015-02-06: 30 mg/h via INTRAVENOUS
  Filled 2015-02-06: qty 200

## 2015-02-06 MED ORDER — FUROSEMIDE 10 MG/ML IJ SOLN: 40.0000 mg | Freq: Three times a day (TID) | INTRAMUSCULAR | Status: DC

## 2015-02-06 MED ORDER — FUROSEMIDE 10 MG/ML IJ SOLN
40.0000 mg | Freq: Three times a day (TID) | INTRAMUSCULAR | Status: DC
  Administered 2015-02-06 – 2015-02-07 (×3): 40 mg via INTRAVENOUS
  Filled 2015-02-06 (×3): qty 4

## 2015-02-06 MED ORDER — POTASSIUM CHLORIDE CRYS ER 20 MEQ PO TBCR
40.0000 meq | EXTENDED_RELEASE_TABLET | Freq: Two times a day (BID) | ORAL | Status: DC
  Administered 2015-02-06 – 2015-02-11 (×12): 40 meq via ORAL
  Filled 2015-02-06 (×12): qty 2

## 2015-02-06 MED ORDER — MILRINONE IN DEXTROSE 20 MG/100ML IV SOLN
0.2000 ug/kg/min | INTRAVENOUS | Status: DC
  Administered 2015-02-06 – 2015-02-07 (×2): 0.2 ug/kg/min via INTRAVENOUS
  Filled 2015-02-06 (×2): qty 100

## 2015-02-06 MED FILL — Heparin Sodium (Porcine) Inj 1000 Unit/ML: INTRAMUSCULAR | Qty: 30 | Status: AC

## 2015-02-06 MED FILL — Potassium Chloride Inj 2 mEq/ML: INTRAVENOUS | Qty: 40 | Status: AC

## 2015-02-06 MED FILL — Magnesium Sulfate Inj 50%: INTRAMUSCULAR | Qty: 10 | Status: AC

## 2015-02-06 NOTE — Progress Notes (Signed)
ANTICOAGULATION CONSULT NOTE - Follow Up Consult  Pharmacy Consult for Heparin Indication: LV thrombus  Allergies  Allergen Reactions  . Codeine Other (See Comments)    intolerance    Patient Measurements: Height: 6\' 1"  (185.4 cm) Weight: 245 lb 13 oz (111.5 kg) IBW/kg (Calculated) : 79.9 Heparin Dosing Weight: ~100kg  Vital Signs: Temp: 97.6 F (36.4 C) (01/11 1300) Temp Source: Axillary (01/11 1300) BP: 109/66 mmHg (01/11 0820) Pulse Rate: 88 (01/11 0820)  Labs:  Recent Labs  02/05/15 0348 02/05/15 1300 02/05/15 2113 02/06/15 0420 02/06/15 1212  HGB 8.9* 8.9*  --  8.8*  --   HCT 26.3* 26.2*  --  25.8*  --   PLT 213 220  --  271  --   HEPARINUNFRC  --   --  0.27* 0.29* 0.39  CREATININE 1.22 1.21  --  1.20  --     Estimated Creatinine Clearance: 79.2 mL/min (by C-G formula based on Cr of 1.2).   Medications:  Heparin @ 1800 units/hr  Assessment: 64yom POD #5 CABG x4 was found to have a mobile LV apical thrombus yesterday on ECHO. IV heparin was started. Heparin level is finally therapeutic. Hemoglobin low but stable. No bleeding reported.  Goal of Therapy:  Heparin level 0.3-0.7 units/ml Monitor platelets by anticoagulation protocol: Yes   Plan:  1) Continue heparin at 1800 units/hr 2) Check 6 hour heparin level to confirm  Bradley RiggerMarkle, Bradley Benitez 02/06/2015,2:18 PM

## 2015-02-06 NOTE — Progress Notes (Signed)
ANTICOAGULATION CONSULT NOTE - Follow Up Consult  Pharmacy Consult for heparin Indication: LV thrombus  Allergies  Allergen Reactions  . Codeine Other (See Comments)    intolerance    Patient Measurements: Height: 6\' 1"  (185.4 cm) Weight: 234 lb 12.6 oz (106.5 kg) IBW/kg (Calculated) : 79.9 Heparin Dosing Weight: ~ 100 kg  Vital Signs: Temp: 97.8 F (36.6 C) (01/11 0405) Temp Source: Oral (01/11 0405) Pulse Rate: 89 (01/11 0400)  Labs:  Recent Labs  02/05/15 0348 02/05/15 1300 02/05/15 2113 02/06/15 0420  HGB 8.9* 8.9*  --  8.8*  HCT 26.3* 26.2*  --  25.8*  PLT 213 220  --  271  HEPARINUNFRC  --   --  0.27* 0.29*  CREATININE 1.22 1.21  --  1.20    Estimated Creatinine Clearance: 77.5 mL/min (by C-G formula based on Cr of 1.2).   Assessment: 65 yo male s/p CABG 4 days ago, found with LV thrombus. Currently on IV heparin at 1600 units/hr.  Heparin level remains slightly subtherapeutic. Hgb low but stable, plt ok. No bleeding noted.  Goal of Therapy:  Heparin level 0.3-0.7 units/ml Monitor platelets by anticoagulation protocol: Yes   Plan:  1. Increase IV heparin to 1800 units/hr 2. Check 6 hr heparin level 3. Daily heparin level and CBC 4. F/u plans for oral anticoagulation as able  Christoper Fabianaron Zuly Belkin, PharmD, BCPS Clinical pharmacist, pager [phone] 02/06/2015 5:09 AM

## 2015-02-06 NOTE — Progress Notes (Signed)
Patient ID: Bradley PilaDavid M Benitez, male   DOB: 1950-05-31, 65 y.o.   MRN: [id number]  SICU Evening Rounds:  Hemodynamically stable on Milrinone 0.2 No further arrhythmias on amio 30 mg/hr.  On heparin drip.  Urine output good.  Continue present plans.

## 2015-02-06 NOTE — Progress Notes (Addendum)
Patient ID: Bradley Benitez, male   DOB: [birth year], 65 y.o.   MRN: [id number]   65 yo with history of DM and smoking but has not had medical care in decades presented with acute systolic CHF.  Echo showed EF 25-30% with apical thrombus and LHC showed 3VD.    LHC/RHC (12/16): RA mean 11 PA 35/11 PCWP mean 23 CI 2.59   Ost RPDA lesion, 70% stenosed.  1st RPLB lesion, 80% stenosed.  Mid Cx lesion, 80% stenosed.  Prox LAD lesion, 60% stenosed.  Mid LAD lesion, 95% stenosed.  Ost 2nd Diag lesion, 70% stenosed.  Ost Ramus to Ramus lesion, 90% stenosed  Cardiac MRI (1/17): 1. Severe LV systolic dysfunction, EF 26%. Wall motion abnormalities as above. 2. Normal RV size and systolic function . 3. LGE pattern suggestive of infarction in LAD territory. The mid anterior/anteroseptal and apical anterior/septal/inferior wall segments and the true apex do not appear viable (probably not likely to improve function with revascularization). The remainder of the left ventricle appears viable. 4. There is a small LV apical thrombus.  CABG x 4 on 1/6.   1/10 had ventricular fibrillation, received total of 9 shocks and ended up on amiodarone and lidocaine gtts.  Norepinephrine was started due to hypotension.  Patient was conscious at end of code.  Echo 1/10 with EF 25%, stable wall motion abnormalities, mild to moderately decreased RV systolic function, mobile LV thrombus again noted.   Now off lidocaine and norepi. No further VT.  Remains on amiodarone gtt and milrinone 0.25 mcg/kg/min. Feeling better today. Some dyspnea at rest and feels swollen.   Co-ox 63%, CVP 17.   Scheduled Meds: . acetaminophen  1,000 mg Oral 4 times per day  . aspirin EC  325 mg Oral Daily  . atorvastatin  80 mg Oral q1800  . bisacodyl  10 mg Oral Daily   Or  . bisacodyl  10 mg Rectal Daily  . carvedilol  3.125 mg Oral BID WC  . digoxin  0.125 mg Oral Daily  . docusate sodium  200 mg Oral Daily  . feeding  supplement (GLUCERNA SHAKE)  237 mL Oral TID BM  . furosemide  20 mg Intravenous BID  . insulin aspart  0-15 Units Subcutaneous TID WC  . insulin detemir  20 Units Subcutaneous Daily  . levalbuterol  0.63 mg Nebulization TID  . pantoprazole  40 mg Oral Daily  . sodium chloride  3 mL Intravenous Q12H  . sodium chloride  3 mL Intravenous Q12H   Continuous Infusions: . sodium chloride 250 mL (02/05/15 0730)  . amiodarone 30 mg/hr (02/06/15 0600)  . amiodarone 30 mg/hr (02/06/15 0909)  . heparin 1,800 Units/hr (02/06/15 0654)  . milrinone 0.376 mcg/kg/min (02/06/15 0600)   PRN Meds:.sodium chloride, metoprolol, ondansetron (ZOFRAN) IV, oxyCODONE, sodium chloride, sodium chloride, traMADol    Filed Vitals:   02/06/15 0600 02/06/15 0724 02/06/15 0820 02/06/15 0900  BP:   109/66   Pulse: 88  88   Temp:    98.5 F (36.9 C)  TempSrc:    Oral  Resp: 23     Height:      Weight: 245 lb 13 oz (111.5 kg)     SpO2: 98% 99%      Intake/Output Summary (Last 24 hours) at 02/06/15 0931 Last data filed at 02/06/15 0600  Gross per 24 hour  Intake 1615.24 ml  Output   1730 ml  Net -114.76 ml    LABS: Basic Metabolic Panel:  Recent Labs  02/05/15 0722 02/05/15 1300 02/06/15 0420  NA  --  138 137  K  --  4.0 3.7  CL  --  104 105  CO2  --  23 23  GLUCOSE  --  199* 115*  BUN  --  32* 31*  CREATININE  --  1.21 1.20  CALCIUM  --  8.4* 8.1*  MG 2.5*  --   --    Liver Function Tests: No results for input(s): AST, ALT, ALKPHOS, BILITOT, PROT, ALBUMIN in the last 72 hours. No results for input(s): LIPASE, AMYLASE in the last 72 hours. CBC:  Recent Labs  02/05/15 1300 02/06/15 0420  WBC 11.9* 10.0  HGB 8.9* 8.8*  HCT 26.2* 25.8*  MCV 82.9 82.4  PLT 220 271   Cardiac Enzymes: No results for input(s): CKTOTAL, CKMB, CKMBINDEX, TROPONINI in the last 72 hours. BNP: Invalid input(s): POCBNP D-Dimer: No results for input(s): DDIMER in the last 72 hours. Hemoglobin A1C: No  results for input(s): HGBA1C in the last 72 hours. Fasting Lipid Panel: No results for input(s): CHOL, HDL, LDLCALC, TRIG, CHOLHDL, LDLDIRECT in the last 72 hours. Thyroid Function Tests: No results for input(s): TSH, T4TOTAL, T3FREE, THYROIDAB in the last 72 hours.  Invalid input(s): FREET3 Anemia Panel: No results for input(s): VITAMINB12, FOLATE, FERRITIN, TIBC, IRON, RETICCTPCT in the last 72 hours.  RADIOLOGY: Dg Chest 2 View  02/05/2015  CLINICAL DATA:  Shortness of breath, status post CABG 4 days ago. EXAM: CHEST  2 VIEW COMPARISON:  PA and lateral chest x-ray of February 04, 2015 FINDINGS: The left lung is well-expanded. There is minimal basilar atelectasis medially. On the right a small amount of pleural fluid remains. The cardiac silhouette is mildly enlarged. The pulmonary vascularity is not engorged. There is no pneumothorax or pneumomediastinum. There are 7 intact sternal wires. The right-sided PICC line tip projects over the junction of the distal SVC with the right atrium. IMPRESSION: Further interval improvement in pulmonary interstitial edema. Persistent left basilar subsegmental atelectasis and persistent trace right pleural effusion. Electronically Signed   By: Isaack  [place] M.D.   On: 02/05/2015 07:53   Dg Chest 2 View  02/04/2015  CLINICAL DATA:  Acute respiratory failure, CHF, previous MI, former smoker. EXAM: CHEST  2 VIEW COMPARISON:  Portable chest x-ray of February 03, 2015 FINDINGS: The lungs are borderline hypoinflated. There is a trace of pleural fluid on the right. Right basilar atelectasis or pneumonia has improved. There remains retrocardiac subsegmental atelectasis but it has improved as well. The cardiac silhouette remains enlarged. The pulmonary vascularity is less engorged. There are post CABG changes. The right-sided PICC line tip projects over the distal third of the SVC. The right internal jugular Cordis sheath has been removed. IMPRESSION: Mild interval improvement  in bibasilar atelectasis or pneumonia. Trace right pleural effusion remains. Stable mild cardiomegaly with decreased pulmonary vascular congestion. Electronically Signed   By: Braxton  [place] M.D.   On: 02/04/2015 08:04   Dg Chest 2 View  01/26/2015  CLINICAL DATA:  Shortness of breath this AM; h/o diabetes; former smoker EXAM: CHEST  2 VIEW COMPARISON:  01/25/2015 FINDINGS: Heart size is normal. There is prominence of interstitial markings, increased. Perihilar peribronchial thickening and noted. There are no focal consolidations. There are small bilateral pleural effusions. IMPRESSION: 1. Increased bronchitic changes. 2. Increased bilateral pleural effusions. Electronically Signed   By: Norva Pavlov M.D.   On: 01/26/2015 10:34   Dg Chest Port 1 View  02/06/2015  CLINICAL DATA:  CABG. EXAM: PORTABLE CHEST 1 VIEW COMPARISON:  02/05/2015. FINDINGS: Right PICC line in stable position. Prior CABG. Cardiomegaly with pulmonary venous congestion and interstitial prominence, no change from prior exam. Findings consistent with stable changes of congestive heart failure. Small bilateral pleural effusions. No pneumothorax. IMPRESSION: 1. Right PICC line in stable position. 2. Prior CABG. Persistent changes of congestive heart failure with mild pulmonary interstitial edema and small pleural effusions. Electronically Signed   By: Maisie Fus  Register   On: 02/06/2015 07:20   Dg Chest Port 1 View  02/05/2015  CLINICAL DATA:  Code. EXAM: PORTABLE CHEST 1 VIEW COMPARISON:  Earlier today at 627 hours.  One day prior. FINDINGS: 741 hours. Extensive artifact degradation due to overlying support apparatus, external pacer. A right sided PICC line terminates at the low SVC. Patient rotated left. Prior median sternotomy. Cardiomegaly accentuated by AP portable technique. Left costophrenic angle excluded. Probable layering right pleural effusion. No pneumothorax. Low lung volumes, accentuating interstitial edema. Developing right  base airspace disease. IMPRESSION: Developing congestive heart failure. Probable small right pleural effusion with adjacent right base atelectasis. Extensive artifact degradation. Electronically Signed   By: Jeronimo Greaves M.D.   On: 02/05/2015 07:50   Dg Chest Port 1 View  02/03/2015  CLINICAL DATA:  Atelectasis EXAM: PORTABLE CHEST 1 VIEW COMPARISON:  Chest x-rays dated 02/02/2015 and 02/01/2015. FINDINGS: Left-sided chest tube is no longer seen. Swan-Ganz catheter is also no longer seen. Mediastinal drains have also been removed. Right IJ central line access site remains with tip projected over the upper SVC. Right-sided PICC line appears well positioned with tip projected over the lower SVC. Cardiomediastinal silhouette is stable in size and configuration. Cardiomegaly is stable. Median sternotomy wires are intact and stable in alignment, presumably for CABG. There is mild central pulmonary vascular congestion. Ill-defined bibasilar airspace opacities are not not significantly changed, presumably atelectasis and/or small effusions. No new lung findings. No pneumothorax seen. IMPRESSION: 1. Multiple tubes and lines removed, as above. 2. Persistent mild central pulmonary vascular congestion without evidence of overt pulmonary edema. 3. Persistent ill-defined bibasilar airspace opacities, presumably atelectasis and/or small effusions. 4. No new lung findings. 5. Stable cardiomegaly. Electronically Signed   By: Bary Richard M.D.   On: 02/03/2015 08:02   Dg Chest Port 1 View  02/02/2015  CLINICAL DATA:  65 year old male status post 4 vessel CABG EXAM: PORTABLE CHEST 1 VIEW COMPARISON:  Prior chest x-ray 02/01/2015 FINDINGS: The patient has been extubated and the nasogastric tube removed. A right IJ vascular sheath convey is a Swan-Ganz catheter into the heart. The tip of the Swan overlies the proximal right lower lobe pulmonary artery. A right upper extremity PICC is again noted in good position with the tip  overlying the upper right atrium. Mediastinal and left thoracic drains are present via a subxiphoid approach. Patient is status post median sternotomy with evidence of multivessel CABG. Stable cardiomegaly. Mediastinal contours are grossly unchanged given lower inspiratory volumes. Lower inspiratory volumes with increased bibasilar opacities favored to reflect atelectasis. Pulmonary vascular congestion without overt edema. No evidence of pneumothorax or large effusion. No acute osseous abnormality. IMPRESSION: 1. Interval extubation and removal of nasogastric tube. 2. Lower inspiratory volumes with increasing bibasilar atelectasis. 3. Pulmonary vascular congestion without overt edema. 4. Other support apparatus in stable and satisfactory position as above. Electronically Signed   By: Malachy Moan M.D.   On: 02/02/2015 08:54   Dg Chest Port 1 View  02/01/2015  CLINICAL DATA:  Patient status post CABG procedure. EXAM: PORTABLE CHEST 1 VIEW COMPARISON:  Chest radiograph 01/26/2015. FINDINGS: ET tube terminates in the mid trachea. Enteric tube courses inferior to the diaphragm. Right IJ approach pulmonary arterial catheter is present with tip projecting over the expected location of the right main pulmonary artery. Multiple mediastinal drains are present. Right upper extremity PICC line is present with tip projecting at the superior cavoatrial junction. Left chest tube in place. Stable cardiac and mediastinal contours status post median sternotomy and CABG procedure. No large area of pulmonary consolidation. No definite pleural effusion or pneumothorax. No aggressive or acute appearing osseous lesions. IMPRESSION: Support apparatus as above. Electronically Signed   By: Annia Belt M.D.   On: 02/01/2015 16:04   Dg Chest Port 1 View  01/25/2015  CLINICAL DATA:  Acute respiratory failure with hypoxemia.  Fever. EXAM: PORTABLE CHEST 1 VIEW COMPARISON:  01/23/2015. FINDINGS: Grossly stable enlarged cardiac  silhouette. Prominent pulmonary vasculature and interstitial markings with improvement. Interval small amount of linear density in the right mid lung zone. Minimal right pleural fluid. Central peribronchial thickening. Unremarkable bones. IMPRESSION: 1. Improving changes of congestive heart failure. 2. Small amount of linear atelectasis in the right mid lung zone. 3. Moderate bronchitic changes. Electronically Signed   By: Beckie Salts M.D.   On: 01/25/2015 16:34   Dg Chest Port 1 View  01/23/2015  CLINICAL DATA:  Congestive heart failure EXAM: PORTABLE CHEST 1 VIEW COMPARISON:  None. FINDINGS: There is generalized interstitial edema with patchy alveolar edema in the bases. Heart is enlarged with pulmonary venous hypertension. No adenopathy. IMPRESSION: Evidence of congestive heart failure with edema most pronounced in the lung bases. Electronically Signed   By: Bretta Bang III M.D.   On: 01/23/2015 09:34   Mr Card Morphology Wo/w Cm  01/29/2015  CLINICAL DATA:  CAD, assess for viability EXAM: CARDIAC MRI TECHNIQUE: The patient was scanned on a 1.5 Tesla GE magnet. A dedicated cardiac coil was used. Functional imaging was done using Fiesta sequences. 2,3, and 4 chamber views were done to assess for RWMA's. Modified Simpson's rule using a short axis stack was used to calculate an ejection fraction on a dedicated work [employer]. The patient received 34 cc of Multihance. After 10 minutes inversion recovery sequences were used to assess for infiltration and scar tissue. CONTRAST:  34 cc Multihance FINDINGS: There were bilateral small to moderate pleural effusions. There was a trivial pericardial effusion. Normal left ventricular size. EF 26% with mid anteroseptal and mid anterior akinesis. The mid anterolateral wall was hypokinetic. The apical septal, inferior, and anterior segments were akinetic and the apical lateral segment was hypokinetic. The true apex was akinetic. A small apical  thrombus was noted. The atria were normal in size. The right ventricle was normal in size and systolic function. The aortic valve was trileaflet with no stenosis or significant regurgitation. There did not appear to be significant mitral regurgitation. On delayed enhancement imaging, there was subendocardial 76-99% wall thickness late gadolinium enhancement (LGE) in the mid anteroseptal and mid anterior wall segments, in the apical septal/anterior/inferior wall segments, and in the true apex. Basal segments without LGE, anterolateral wall without significant LGE, and mid inferior wall without LGE. MEASUREMENTS: MEASUREMENTS LV EDV 212 mL LV SV 54 mL LV EF 26% IMPRESSION: 1. Severe LV systolic dysfunction, EF 26%. Wall motion abnormalities as above. 2.  Normal RV size and systolic function . 3. LGE pattern suggestive of infarction in LAD territory. The  mid anterior/anteroseptal and apical anterior/septal/inferior wall segments and the true apex do not appear viable (probably not likely to improve function with revascularization). The remainder of the left ventricle appears viable. 4.  There is a small LV apical thrombus. Millisa Giarrusso Electronically Signed   By: [name] M.D.   On: 01/29/2015 09:10    PHYSICAL EXAM CVP 17  General: venti mask Neck: JVP to jaw cm, no thyromegaly or thyroid nodule.  Lungs: Decreased breath sounds at bases bilaterally.  CV: Nondisplaced PMI.  Heart regular S1/S2, no S3/S4, no murmur.  R and LLE 2+ edema  No carotid bruit.  Normal pedal pulses.  Abdomen: Soft, nontender, no hepatosplenomegaly, no distention.  Neurologic: Alert and oriented x 3.  Psych: Normal affect. Extremities: No clubbing or cyanosis. Lesions on toes noted.   TELEMETRY: Reviewed telemetry pt v-paced  ASSESSMENT AND PLAN: 65 yo with history of DM and smoking but has not had medical care in decades presented with acute systolic CHF.  Echo showed EF 25-30% with apical thrombus and LHC showed  3VD. 1. Ventricular fibrillation arrest: 9 shocks total yesterday morning, now on amiodarone and lidocaine gtts.  Likely scar-mediated VT/VF. Stop lidocaine drip today, continue amiodarone gtt today.  Likely to po amiodarone tomorrow.  2. Acute systolic CHF: EF 16% with regional WMAs (stable on echo yesterday), ischemic cardiomyopathy.  Now s/p CABG x 4.  He is volume overloaded.  Milrinone restarted yesterday after cardiac arrest.  Co-ox up to 63%, CVP 17.  - Continue milrinone today, he is also on digoxin.  - Lasix 40 mg IV every 8 hrs and replete K.  3. CAD: Diffuse 3 vessel disease, see cath report above. MRI reviewed: LAD territory did not appear to have significant viability.  Remainder of LV myocardium did appear viable.  He had severe disease in LCx and RCA territory that may benefit from revascularization but suspect LAD territory will not recover.  He had CABG x 4 on 1/6.  Suspect ventricular fibrillation arrest 1/10 was scar-mediated as opposed to occlusion of graft, etc.  - Continue ASA, atorvastatin 80 daily.  4. LV mural thrombus: Seen again on 1/10 echo, mobile thrombus.  On heparin gtt, will transition to Eliquis prior to discharge.  Given mental illness, think coumadin may be difficult for him to manage.  5. Diabetes: Continue insulin.  6. Foot lesions: WOC consult appreciated. Continue dry dressing.  7. Diabetic neuropathy: Symptomatic neuropathy.  Added gabapentin 300 mg bid.  8. Deconditioning: Hopefully can get OOB today.   Amy Clegg NP-C  02/06/2015 9:31 AM   Patient seen with NP, agree with the above note.  No further VT.  Good co-ox back on milrinone.  However, markedly volume overloaded now with CVP 17.  - Lasix 40 mg IV every 8 hrs and replace K.  - May stop lidocaine.   - Continue amiodarone.  - Continue current milrinone.  - IV heparin for now, will transition to Eliquis rather than coumadin for reasons outlined above.   [name] 02/06/2015 9:53 AM

## 2015-02-06 NOTE — Progress Notes (Signed)
Physical Therapy Treatment Patient Details Name: Bradley Benitez MRN: [id number] DOB: [birth year] Today's Date: 02/06/2015    History of Present Illness 64 y/o male with diabetes but currently does not go to the doctor or take any medications presented to the [facility] emergency department on 01/23/2015 complaining of shortness of breath. In the emergency department he was noted to be profoundly hypoxemic, requiring BiPAP for respiratory assistance, he had an elevated lactic acid. He was given antibiotics for presumed sepsis.Tentative plan for CABG 02/01/2015.  S/p CABG 1/7, 1/10  vfib episode with 8-9 shock to get pt into rhthym.    PT Comments    Progressing slowly.  Pt very self limiting and needing lots of encouragement post recent cardiac episode.  Follow Up Recommendations  SNF     Equipment Recommendations       Recommendations for Other Services       Precautions / Restrictions      Mobility  Bed Mobility Overal bed mobility: Needs Assistance Bed Mobility: Supine to Sit   Sidelying to sit: Mod assist       General bed mobility comments: assist to come up and forward without use of UE's  Transfers Overall transfer level: Needs assistance   Transfers: Sit to/from Stand Sit to Stand: Min assist;+2 physical assistance         General transfer comment: cues for UE's across lap to avoid using  Ambulation/Gait Ambulation/Gait assistance: Mod assist;+2 safety/equipment Ambulation Distance (Feet): 10 Feet (over 6 min) Assistive device: Rolling walker (2 wheeled) Gait Pattern/deviations: Step-through pattern;Decreased step length - right;Decreased step length - left;Decreased stride length Gait velocity: extremely slow   General Gait Details: pt somewhat self limiting today with lots of procrasitination, I suspect due to lots of fear.   Stairs            Wheelchair Mobility    Modified Rankin (Stroke Patients Only)       Balance Overall balance  assessment: Needs assistance   Sitting balance-Leahy Scale: Fair       Standing balance-Leahy Scale: Poor                      Cognition Arousal/Alertness: Awake/alert Behavior During Therapy: WFL for tasks assessed/performed;Anxious Overall Cognitive Status: Impaired/Different from baseline           Safety/Judgement: Decreased awareness of safety;Decreased awareness of deficits          Exercises      General Comments        Pertinent Vitals/Pain Pain Assessment: Faces Faces Pain Scale: Hurts little more Pain Location: chest Pain Descriptors / Indicators: Aching;Sore Pain Intervention(s): Monitored during session    Home Living                      Prior Function            PT Goals (current goals can now be found in the care plan section) Acute Rehab PT Goals PT Goal Formulation: With patient Time For Goal Achievement: 02/13/15 Potential to Achieve Goals: Good Progress towards PT goals: Progressing toward goals    Frequency  Min 3X/week    PT Plan Current plan remains appropriate    Co-evaluation             End of Session   Activity Tolerance: Patient tolerated treatment well Patient left: in chair;with call bell/phone within reach     Time: 1310-1341 PT Time Calculation (min) (ACUTE ONLY):  31 min  Charges:  $Gait Training: 8-22 mins $Therapeutic Activity: 8-22 mins                    G Codes:      Payal Stanforth, [name] 02/06/2015, 3:35 PM 02/06/2015  [place] BingKen Ryer Asato, PT [phone]  (pager)

## 2015-02-06 NOTE — Progress Notes (Signed)
ANTICOAGULATION CONSULT NOTE - Follow Up Consult  Pharmacy Consult for Heparin Indication: LV thrombus  Allergies  Allergen Reactions  . Codeine Other (See Comments)    intolerance    Patient Measurements: Height: 6\' 1"  (185.4 cm) Weight: 245 lb 13 oz (111.5 kg) IBW/kg (Calculated) : 79.9 Heparin Dosing Weight: ~100kg  Vital Signs: Temp: 97.4 F (36.3 C) (01/11 1700) Temp Source: Oral (01/11 1700) BP: 106/68 mmHg (01/11 1800) Pulse Rate: 88 (01/11 1735)  Labs:  Recent Labs  02/05/15 0348 02/05/15 1300  02/06/15 0420 02/06/15 1212 02/06/15 1745  HGB 8.9* 8.9*  --  8.8*  --   --   HCT 26.3* 26.2*  --  25.8*  --   --   PLT 213 220  --  271  --   --   HEPARINUNFRC  --   --   < > 0.29* 0.39 0.37  CREATININE 1.22 1.21  --  1.20  --   --   < > = values in this interval not displayed.  Estimated Creatinine Clearance: 79.2 mL/min (by C-G formula based on Cr of 1.2).   Medications:  Heparin @ 1800 units/hr  Assessment: 64yom POD #5 CABG x4 was found to have a mobile LV apical thrombus yesterday on ECHO. IV heparin was started. Heparin remains therapeutic. RN reports no s/s of bleeding   Goal of Therapy:  Heparin level 0.3-0.7 units/ml Monitor platelets by anticoagulation protocol: Yes   Plan:  1) Continue heparin at 1800 units/hr 2) Monitor daily HL, CBC and s/s of bleeding   Vinnie LevelBenjamin Allia Wiltsey, PharmD., BCPS Clinical Pharmacist Pager [phone]

## 2015-02-06 NOTE — Care Management Note (Signed)
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  Any intervention on her part has been rebuffed by pt.  States he is in agreement with going to facility for rehab when medically ready.  Discussed ALF options and sister is hopeful that pt will consider same after his rehab stay.                         Expected Discharge Plan:  Skilled Nursing Facility  In-House Referral:  Clinical Social Work  Discharge planning Services  CM Consult  Status of Service:  In process, will continue to follow  Medicare Important Message Given:  Yes  Girard Cooter, RN 02/06/2015, 3:11 PM

## 2015-02-06 NOTE — Plan of Care (Signed)
Problem: Activity: Goal: Capacity to carry out activities will improve Outcome: Not Met (add Reason) Pt very adamant about being incapable of turning, sitting or walking.  He requires much encouragement, but refuses most turns and position changes adamantly.  Problem: Education: Goal: Ability to verbalize understanding of medication therapies will improve Outcome: Progressing Pt has a good understanding of his current medication plan

## 2015-02-07 ENCOUNTER — Inpatient Hospital Stay (HOSPITAL_COMMUNITY): Payer: PPO

## 2015-02-07 LAB — CARBOXYHEMOGLOBIN
CARBOXYHEMOGLOBIN: 1.8 % — AB (ref 0.5–1.5)
Carboxyhemoglobin: 1.5 % (ref 0.5–1.5)
Methemoglobin: 0.6 % (ref 0.0–1.5)
Methemoglobin: 0.9 % (ref 0.0–1.5)
O2 SAT: 61.9 %
O2 Saturation: 55 %
TOTAL HEMOGLOBIN: 9 g/dL — AB (ref 13.5–18.0)
Total hemoglobin: 9.7 g/dL — ABNORMAL LOW (ref 13.5–18.0)

## 2015-02-07 LAB — BASIC METABOLIC PANEL
Anion gap: 7 (ref 5–15)
BUN: 28 mg/dL — ABNORMAL HIGH (ref 6–20)
CO2: 23 mmol/L (ref 22–32)
Calcium: 7.8 mg/dL — ABNORMAL LOW (ref 8.9–10.3)
Chloride: 108 mmol/L (ref 101–111)
Creatinine, Ser: 1.25 mg/dL — ABNORMAL HIGH (ref 0.61–1.24)
GFR calc Af Amer: >60 mL/min (ref >60)
GFR calc non Af Amer: 58 mL/min — ABNORMAL LOW (ref >60)
Glucose, Bld: 124 mg/dL — ABNORMAL HIGH (ref 65–99)
Potassium: 3.7 mmol/L (ref 3.5–5.1)
Sodium: 138 mmol/L (ref 135–145)

## 2015-02-07 LAB — GLUCOSE, CAPILLARY
GLUCOSE-CAPILLARY: 146 mg/dL — AB (ref 65–99)
GLUCOSE-CAPILLARY: 134 mg/dL — AB (ref 65–99)
GLUCOSE-CAPILLARY: 125 mg/dL — AB (ref 65–99)
Glucose-Capillary: 116 mg/dL — ABNORMAL HIGH (ref 65–99)

## 2015-02-07 LAB — CBC
HCT: 26 % — ABNORMAL LOW (ref 39.0–52.0)
Hemoglobin: 8.8 g/dL — ABNORMAL LOW (ref 13.0–17.0)
MCH: 28 pg (ref 26.0–34.0)
MCHC: 33.8 g/dL (ref 30.0–36.0)
MCV: 82.8 fL (ref 78.0–100.0)
Platelets: 294 10*3/uL (ref 150–400)
RBC: 3.14 MIL/uL — ABNORMAL LOW (ref 4.22–5.81)
RDW: 13.9 % (ref 11.5–15.5)
WBC: 8.4 10*3/uL (ref 4.0–10.5)

## 2015-02-07 LAB — HEPARIN LEVEL (UNFRACTIONATED): Heparin Unfractionated: 0.49 IU/mL (ref 0.30–0.70)

## 2015-02-07 MED ORDER — FUROSEMIDE 10 MG/ML IJ SOLN
40.0000 mg | Freq: Two times a day (BID) | INTRAMUSCULAR | Status: DC
  Administered 2015-02-07 – 2015-02-08 (×2): 40 mg via INTRAVENOUS
  Filled 2015-02-07: qty 4

## 2015-02-07 MED ORDER — POTASSIUM CHLORIDE CRYS ER 20 MEQ PO TBCR
40.0000 meq | EXTENDED_RELEASE_TABLET | Freq: Once | ORAL | Status: AC
  Administered 2015-02-07: 40 meq via ORAL
  Filled 2015-02-07: qty 2

## 2015-02-07 MED ORDER — MILRINONE IN DEXTROSE 20 MG/100ML IV SOLN
0.1250 ug/kg/min | INTRAVENOUS | Status: DC
  Administered 2015-02-07 – 2015-02-08 (×2): 0.125 ug/kg/min via INTRAVENOUS
  Filled 2015-02-07 (×2): qty 100

## 2015-02-07 MED ORDER — SPIRONOLACTONE 25 MG PO TABS
12.5000 mg | ORAL_TABLET | Freq: Every day | ORAL | Status: DC
  Administered 2015-02-07: 12.5 mg via ORAL
  Filled 2015-02-07 (×2): qty 1

## 2015-02-07 MED ORDER — SODIUM CHLORIDE 0.9 % IV SOLN
INTRAVENOUS | Status: DC
  Administered 2015-02-08 – 2015-02-09 (×2): via INTRAVENOUS

## 2015-02-07 NOTE — Progress Notes (Signed)
6 Days Post-Op Procedure(s) (LRB): CORONARY ARTERY BYPASS GRAFTING (CABG)x 4 using left internal mammary artery and right greater saphenous leg vein using endoscope. (N/A) TRANSESOPHAGEAL ECHOCARDIOGRAM (TEE) (N/A)  CENTRIMAG VENTRICULAR ASSIST DEVICE, back up (N/A) Subjective: No more VT-VF A-paced with stable BP One more day of IV amiodarone then start po tomorrow He cannot be started on eliquis until temp pacing wires are out- cont iv heparin goal 0.3-0.5 since he is fresh postop Will start PT today and remove A-line  Objective: Vital signs in last 24 hours: Temp:  [97.4 F (36.3 C)-97.9 F (36.6 C)] 97.4 F (36.3 C) (01/12 1159) Pulse Rate:  [87-93] 88 (01/12 1200) Cardiac Rhythm:  [-] Atrial paced (01/12 1200) Resp:  [15-26] 18 (01/12 1200) BP: (86-112)/(60-73) 111/72 mmHg (01/12 1200) SpO2:  [94 %-100 %] 98 % (01/12 1200) Arterial Line BP: (80-120)/(28-61) 97/43 mmHg (01/12 0900) FiO2 (%):  [30 %] 30 % (01/11 1437) Weight:  [241 lb 2.9 oz (109.4 kg)] 241 lb 2.9 oz (109.4 kg) (01/12 0500)  Hemodynamic parameters for last 24 hours: CVP:  [1 mmHg-66 mmHg] 52 mmHg  Intake/Output from previous day: 01/11 0701 - 01/12 0700 In: 1610.1 [P.O.:600; I.V.:1010.1] Out: 1945 [Urine:1945] Intake/Output this shift: Total I/O In: 439.6 [P.O.:350; I.V.:89.6] Out: -   Edema better Lungs clear Neuro intact  Lab Results:  Recent Labs  02/06/15 0420 02/07/15 0413  WBC 10.0 8.4  HGB 8.8* 8.8*  HCT 25.8* 26.0*  PLT 271 294   BMET:  Recent Labs  02/06/15 0420 02/07/15 0413  NA 137 138  K 3.7 3.7  CL 105 108  CO2 23 23  GLUCOSE 115* 124*  BUN 31* 28*  CREATININE 1.20 1.25*  CALCIUM 8.1* 7.8*    PT/INR: No results for input(s): LABPROT, INR in the last 72 hours. ABG    Component Value Date/Time   PHART 7.449 02/06/2015 0647   HCO3 22.5 02/06/2015 0647   TCO2 24 02/06/2015 0647   ACIDBASEDEF 1.0 02/06/2015 0647   O2SAT 61.9 02/07/2015 0516   CBG (last 3)    Recent Labs  02/06/15 2200 02/07/15 0734 02/07/15 1157  GLUCAP 128* 116* 146*    Assessment/Plan: S/P Procedure(s) (LRB): CORONARY ARTERY BYPASS GRAFTING (CABG)x 4 using left internal mammary artery and right greater saphenous leg vein using endoscope. (N/A) TRANSESOPHAGEAL ECHOCARDIOGRAM (TEE) (N/A)  CENTRIMAG VENTRICULAR ASSIST DEVICE, back up (N/A) Cont iv lasix diuresis No Eliquis until 1-2 days before discharge Keep in ICU today  LOS: 15 days    Kathlee Nationseter Van Trigt III 02/07/2015

## 2015-02-07 NOTE — Progress Notes (Signed)
Patient ID: Bradley PilaDavid M Dahir, male   DOB: [birth year], 65 y.o.   MRN: [id number] EVENING ROUNDS NOTE :     [address]       [place] [id number]             [phone]                 6 Days Post-Op Procedure(s) (LRB): CORONARY ARTERY BYPASS GRAFTING (CABG)x 4 using left internal mammary artery and right greater saphenous leg vein using endoscope. (N/A) TRANSESOPHAGEAL ECHOCARDIOGRAM (TEE) (N/A)  CENTRIMAG VENTRICULAR ASSIST DEVICE, back up (N/A)  Total Length of Stay:  LOS: 15 days  BP 104/68 mmHg  Pulse 88  Temp(Src) 97.6 F (36.4 C) (Oral)  Resp 18  Ht 6\' 1"  (1.854 m)  Wt 241 lb 2.9 oz (109.4 kg)  BMI 31.83 kg/m2  SpO2 97%  .Intake/Output      01/11 0701 - 01/12 0700 01/12 0701 - 01/13 0700   P.O. 600 410   I.V. (mL/kg) 1010.1 (9.2) 262.3 (2.4)   Total Intake(mL/kg) 1610.1 (14.7) 672.3 (6.1)   Urine (mL/kg/hr) 1945 (0.7) 300 (0.2)   Stool 0 (0)    Total Output 1945 300   Net -334.9 +372.3        Stool Occurrence 1 x      . sodium chloride 250 mL (02/05/15 0730)  . amiodarone 30 mg/hr (02/07/15 0931)  . heparin 1,800 Units/hr (02/07/15 1308)  . milrinone 0.125 mcg/kg/min (02/07/15 0745)     Lab Results  Component Value Date   WBC 8.4 02/07/2015   HGB 8.8* 02/07/2015   HCT 26.0* 02/07/2015   PLT 294 02/07/2015   GLUCOSE 124* 02/07/2015   CHOL 157 01/24/2015   TRIG 66 01/24/2015   HDL 34* 01/24/2015   LDLCALC 110* 01/24/2015   ALT 20 01/26/2015   AST 28 01/26/2015   NA 138 02/07/2015   K 3.7 02/07/2015   CL 108 02/07/2015   CREATININE 1.25* 02/07/2015   BUN 28* 02/07/2015   CO2 23 02/07/2015   TSH 1.311 01/23/2015   INR 1.44 02/01/2015   HGBA1C 9.9* 01/30/2015   Milon ScoreStable,   Celisse Ciulla B Lauriel Helin MD  Beeper [phone] Office [phone] 02/07/2015 6:19 PM

## 2015-02-07 NOTE — Progress Notes (Signed)
Patient ID: Bradley PilaDavid M Benitez, male   DOB: [birth year], 65 y.o.   MRN: [id number]   65 yo with history of DM and smoking but has not had medical care in decades presented with acute systolic CHF.  Echo showed EF 25-30% with apical thrombus and LHC showed 3VD.    LHC/RHC (12/16): RA mean 11 PA 35/11 PCWP mean 23 CI 2.59   Ost RPDA lesion, 70% stenosed.  1st RPLB lesion, 80% stenosed.  Mid Cx lesion, 80% stenosed.  Prox LAD lesion, 60% stenosed.  Mid LAD lesion, 95% stenosed.  Ost 2nd Diag lesion, 70% stenosed.  Ost Ramus to Ramus lesion, 90% stenosed  Cardiac MRI (1/17): 1. Severe LV systolic dysfunction, EF 26%. Wall motion abnormalities as above. 2. Normal RV size and systolic function . 3. LGE pattern suggestive of infarction in LAD territory. The mid anterior/anteroseptal and apical anterior/septal/inferior wall segments and the true apex do not appear viable (probably not likely to improve function with revascularization). The remainder of the left ventricle appears viable. 4. There is a small LV apical thrombus.  CABG x 4 on 1/6.   1/10 had ventricular fibrillation, received total of 9 shocks and ended up on amiodarone and lidocaine gtts.  Norepinephrine was started due to hypotension.  Patient was conscious at end of code.  Echo 1/10 with EF 25%, stable wall motion abnormalities, mild to moderately decreased RV systolic function, mobile LV thrombus again noted.   Now off lidocaine and norepi. No further VT.  Remains on amiodarone gtt and milrinone 0.25 mcg/kg/min. Yesterday he was diuresed with IV lasix. Weight down 4 pounds. Todays CO-OX is 62%.  CVP 8.   Complaining of L elbow pain. Denies SOB.    Scheduled Meds: . aspirin EC  325 mg Oral Daily  . atorvastatin  80 mg Oral q1800  . bisacodyl  10 mg Oral Daily   Or  . bisacodyl  10 mg Rectal Daily  . carvedilol  3.125 mg Oral BID WC  . digoxin  0.125 mg Oral Daily  . docusate sodium  200 mg Oral Daily  .  feeding supplement (GLUCERNA SHAKE)  237 mL Oral TID BM  . furosemide  40 mg Intravenous Q8H  . insulin aspart  0-15 Units Subcutaneous TID WC  . insulin detemir  20 Units Subcutaneous Daily  . levalbuterol  0.63 mg Nebulization TID  . pantoprazole  40 mg Oral Daily  . potassium chloride  40 mEq Oral BID  . sodium chloride  3 mL Intravenous Q12H  . sodium chloride  3 mL Intravenous Q12H  . sucralfate  1 g Oral TID WC & HS   Continuous Infusions: . sodium chloride 250 mL (02/05/15 0730)  . amiodarone 30 mg/hr (02/07/15 0700)  . heparin 1,800 Units/hr (02/07/15 0700)  . milrinone 0.2 mcg/kg/min (02/07/15 0700)   PRN Meds:.sodium chloride, metoprolol, ondansetron (ZOFRAN) IV, oxyCODONE, sodium chloride, sodium chloride, traMADol    Filed Vitals:   02/07/15 0400 02/07/15 0500 02/07/15 0600 02/07/15 0700  BP: 106/69 106/72 102/70 100/68  Pulse: 88 87 88 88  Temp: 97.5 F (36.4 C)     TempSrc: Oral     Resp: 20 21 22 26   Height:      Weight:  241 lb 2.9 oz (109.4 kg)    SpO2: 96% 94% 98% 97%    Intake/Output Summary (Last 24 hours) at 02/07/15 0720 Last data filed at 02/07/15 0700  Gross per 24 hour  Intake 1610.1 ml  Output  1945 ml  Net -334.9 ml    LABS: Basic Metabolic Panel:  Recent Labs  84/13/24 0722  02/06/15 0420 02/07/15 0413  NA  --   < > 137 138  K  --   < > 3.7 3.7  CL  --   < > 105 108  CO2  --   < > 23 23  GLUCOSE  --   < > 115* 124*  BUN  --   < > 31* 28*  CREATININE  --   < > 1.20 1.25*  CALCIUM  --   < > 8.1* 7.8*  MG 2.5*  --   --   --   < > = values in this interval not displayed. Liver Function Tests: No results for input(s): AST, ALT, ALKPHOS, BILITOT, PROT, ALBUMIN in the last 72 hours. No results for input(s): LIPASE, AMYLASE in the last 72 hours. CBC:  Recent Labs  02/06/15 0420 02/07/15 0413  WBC 10.0 8.4  HGB 8.8* 8.8*  HCT 25.8* 26.0*  MCV 82.4 82.8  PLT 271 294   Cardiac Enzymes: No results for input(s): CKTOTAL,  CKMB, CKMBINDEX, TROPONINI in the last 72 hours. BNP: Invalid input(s): POCBNP D-Dimer: No results for input(s): DDIMER in the last 72 hours. Hemoglobin A1C: No results for input(s): HGBA1C in the last 72 hours. Fasting Lipid Panel: No results for input(s): CHOL, HDL, LDLCALC, TRIG, CHOLHDL, LDLDIRECT in the last 72 hours. Thyroid Function Tests: No results for input(s): TSH, T4TOTAL, T3FREE, THYROIDAB in the last 72 hours.  Invalid input(s): FREET3 Anemia Panel: No results for input(s): VITAMINB12, FOLATE, FERRITIN, TIBC, IRON, RETICCTPCT in the last 72 hours.  RADIOLOGY: Dg Chest 2 View  02/05/2015  CLINICAL DATA:  Shortness of breath, status post CABG 4 days ago. EXAM: CHEST  2 VIEW COMPARISON:  PA and lateral chest x-ray of February 04, 2015 FINDINGS: The left lung is well-expanded. There is minimal basilar atelectasis medially. On the right a small amount of pleural fluid remains. The cardiac silhouette is mildly enlarged. The pulmonary vascularity is not engorged. There is no pneumothorax or pneumomediastinum. There are 7 intact sternal wires. The right-sided PICC line tip projects over the junction of the distal SVC with the right atrium. IMPRESSION: Further interval improvement in pulmonary interstitial edema. Persistent left basilar subsegmental atelectasis and persistent trace right pleural effusion. Electronically Signed   By: Yaphet  [place] M.D.   On: 02/05/2015 07:53   Dg Chest 2 View  02/04/2015  CLINICAL DATA:  Acute respiratory failure, CHF, previous MI, former smoker. EXAM: CHEST  2 VIEW COMPARISON:  Portable chest x-ray of February 03, 2015 FINDINGS: The lungs are borderline hypoinflated. There is a trace of pleural fluid on the right. Right basilar atelectasis or pneumonia has improved. There remains retrocardiac subsegmental atelectasis but it has improved as well. The cardiac silhouette remains enlarged. The pulmonary vascularity is less engorged. There are post CABG changes. The  right-sided PICC line tip projects over the distal third of the SVC. The right internal jugular Cordis sheath has been removed. IMPRESSION: Mild interval improvement in bibasilar atelectasis or pneumonia. Trace right pleural effusion remains. Stable mild cardiomegaly with decreased pulmonary vascular congestion. Electronically Signed   By: Sedale  [place] M.D.   On: 02/04/2015 08:04   Dg Chest 2 View  01/26/2015  CLINICAL DATA:  Shortness of breath this AM; h/o diabetes; former smoker EXAM: CHEST  2 VIEW COMPARISON:  01/25/2015 FINDINGS: Heart size is normal. There is prominence of interstitial markings,  increased. Perihilar peribronchial thickening and noted. There are no focal consolidations. There are small bilateral pleural effusions. IMPRESSION: 1. Increased bronchitic changes. 2. Increased bilateral pleural effusions. Electronically Signed   By: Norva Pavlov M.D.   On: 01/26/2015 10:34   Dg Chest Port 1 View  02/06/2015  CLINICAL DATA:  CABG. EXAM: PORTABLE CHEST 1 VIEW COMPARISON:  02/05/2015. FINDINGS: Right PICC line in stable position. Prior CABG. Cardiomegaly with pulmonary venous congestion and interstitial prominence, no change from prior exam. Findings consistent with stable changes of congestive heart failure. Small bilateral pleural effusions. No pneumothorax. IMPRESSION: 1. Right PICC line in stable position. 2. Prior CABG. Persistent changes of congestive heart failure with mild pulmonary interstitial edema and small pleural effusions. Electronically Signed   By: Maisie Fus  Register   On: 02/06/2015 07:20   Dg Chest Port 1 View  02/05/2015  CLINICAL DATA:  Code. EXAM: PORTABLE CHEST 1 VIEW COMPARISON:  Earlier today at 627 hours.  One day prior. FINDINGS: 741 hours. Extensive artifact degradation due to overlying support apparatus, external pacer. A right sided PICC line terminates at the low SVC. Patient rotated left. Prior median sternotomy. Cardiomegaly accentuated by AP portable  technique. Left costophrenic angle excluded. Probable layering right pleural effusion. No pneumothorax. Low lung volumes, accentuating interstitial edema. Developing right base airspace disease. IMPRESSION: Developing congestive heart failure. Probable small right pleural effusion with adjacent right base atelectasis. Extensive artifact degradation. Electronically Signed   By: Jeronimo Greaves M.D.   On: 02/05/2015 07:50   Dg Chest Port 1 View  02/03/2015  CLINICAL DATA:  Atelectasis EXAM: PORTABLE CHEST 1 VIEW COMPARISON:  Chest x-rays dated 02/02/2015 and 02/01/2015. FINDINGS: Left-sided chest tube is no longer seen. Swan-Ganz catheter is also no longer seen. Mediastinal drains have also been removed. Right IJ central line access site remains with tip projected over the upper SVC. Right-sided PICC line appears well positioned with tip projected over the lower SVC. Cardiomediastinal silhouette is stable in size and configuration. Cardiomegaly is stable. Median sternotomy wires are intact and stable in alignment, presumably for CABG. There is mild central pulmonary vascular congestion. Ill-defined bibasilar airspace opacities are not not significantly changed, presumably atelectasis and/or small effusions. No new lung findings. No pneumothorax seen. IMPRESSION: 1. Multiple tubes and lines removed, as above. 2. Persistent mild central pulmonary vascular congestion without evidence of overt pulmonary edema. 3. Persistent ill-defined bibasilar airspace opacities, presumably atelectasis and/or small effusions. 4. No new lung findings. 5. Stable cardiomegaly. Electronically Signed   By: Bary Richard M.D.   On: 02/03/2015 08:02   Dg Chest Port 1 View  02/02/2015  CLINICAL DATA:  65 year old male status post 4 vessel CABG EXAM: PORTABLE CHEST 1 VIEW COMPARISON:  Prior chest x-ray 02/01/2015 FINDINGS: The patient has been extubated and the nasogastric tube removed. A right IJ vascular sheath convey is a Swan-Ganz catheter  into the heart. The tip of the Swan overlies the proximal right lower lobe pulmonary artery. A right upper extremity PICC is again noted in good position with the tip overlying the upper right atrium. Mediastinal and left thoracic drains are present via a subxiphoid approach. Patient is status post median sternotomy with evidence of multivessel CABG. Stable cardiomegaly. Mediastinal contours are grossly unchanged given lower inspiratory volumes. Lower inspiratory volumes with increased bibasilar opacities favored to reflect atelectasis. Pulmonary vascular congestion without overt edema. No evidence of pneumothorax or large effusion. No acute osseous abnormality. IMPRESSION: 1. Interval extubation and removal of nasogastric tube. 2. Lower  inspiratory volumes with increasing bibasilar atelectasis. 3. Pulmonary vascular congestion without overt edema. 4. Other support apparatus in stable and satisfactory position as above. Electronically Signed   By: Malachy Moan M.D.   On: 02/02/2015 08:54   Dg Chest Port 1 View  02/01/2015  CLINICAL DATA:  Patient status post CABG procedure. EXAM: PORTABLE CHEST 1 VIEW COMPARISON:  Chest radiograph 01/26/2015. FINDINGS: ET tube terminates in the mid trachea. Enteric tube courses inferior to the diaphragm. Right IJ approach pulmonary arterial catheter is present with tip projecting over the expected location of the right main pulmonary artery. Multiple mediastinal drains are present. Right upper extremity PICC line is present with tip projecting at the superior cavoatrial junction. Left chest tube in place. Stable cardiac and mediastinal contours status post median sternotomy and CABG procedure. No large area of pulmonary consolidation. No definite pleural effusion or pneumothorax. No aggressive or acute appearing osseous lesions. IMPRESSION: Support apparatus as above. Electronically Signed   By: Annia Belt M.D.   On: 02/01/2015 16:04   Dg Chest Port 1 View  01/25/2015   CLINICAL DATA:  Acute respiratory failure with hypoxemia.  Fever. EXAM: PORTABLE CHEST 1 VIEW COMPARISON:  01/23/2015. FINDINGS: Grossly stable enlarged cardiac silhouette. Prominent pulmonary vasculature and interstitial markings with improvement. Interval small amount of linear density in the right mid lung zone. Minimal right pleural fluid. Central peribronchial thickening. Unremarkable bones. IMPRESSION: 1. Improving changes of congestive heart failure. 2. Small amount of linear atelectasis in the right mid lung zone. 3. Moderate bronchitic changes. Electronically Signed   By: Beckie Salts M.D.   On: 01/25/2015 16:34   Dg Chest Port 1 View  01/23/2015  CLINICAL DATA:  Congestive heart failure EXAM: PORTABLE CHEST 1 VIEW COMPARISON:  None. FINDINGS: There is generalized interstitial edema with patchy alveolar edema in the bases. Heart is enlarged with pulmonary venous hypertension. No adenopathy. IMPRESSION: Evidence of congestive heart failure with edema most pronounced in the lung bases. Electronically Signed   By: Bretta Bang III M.D.   On: 01/23/2015 09:34   Mr Card Morphology Wo/w Cm  01/29/2015  CLINICAL DATA:  CAD, assess for viability EXAM: CARDIAC MRI TECHNIQUE: The patient was scanned on a 1.5 Tesla GE magnet. A dedicated cardiac coil was used. Functional imaging was done using Fiesta sequences. 2,3, and 4 chamber views were done to assess for RWMA's. Modified Simpson's rule using a short axis stack was used to calculate an ejection fraction on a dedicated work [employer]. The patient received 34 cc of Multihance. After 10 minutes inversion recovery sequences were used to assess for infiltration and scar tissue. CONTRAST:  34 cc Multihance FINDINGS: There were bilateral small to moderate pleural effusions. There was a trivial pericardial effusion. Normal left ventricular size. EF 26% with mid anteroseptal and mid anterior akinesis. The mid anterolateral wall was  hypokinetic. The apical septal, inferior, and anterior segments were akinetic and the apical lateral segment was hypokinetic. The true apex was akinetic. A small apical thrombus was noted. The atria were normal in size. The right ventricle was normal in size and systolic function. The aortic valve was trileaflet with no stenosis or significant regurgitation. There did not appear to be significant mitral regurgitation. On delayed enhancement imaging, there was subendocardial 76-99% wall thickness late gadolinium enhancement (LGE) in the mid anteroseptal and mid anterior wall segments, in the apical septal/anterior/inferior wall segments, and in the true apex. Basal segments without LGE, anterolateral wall without significant LGE,  and mid inferior wall without LGE. MEASUREMENTS: MEASUREMENTS LV EDV 212 mL LV SV 54 mL LV EF 26% IMPRESSION: 1. Severe LV systolic dysfunction, EF 26%. Wall motion abnormalities as above. 2.  Normal RV size and systolic function . 3. LGE pattern suggestive of infarction in LAD territory. The mid anterior/anteroseptal and apical anterior/septal/inferior wall segments and the true apex do not appear viable (probably not likely to improve function with revascularization). The remainder of the left ventricle appears viable. 4.  There is a small LV apical thrombus. Neytiri Asche Electronically Signed   By: [name] M.D.   On: 01/29/2015 09:10    PHYSICAL EXAM CVP 8 General: NAD . In the chaiar.  Neck: JVP 8-9 cm, no thyromegaly or thyroid nodule.  Lungs: Decreased breath sounds at bases bilaterally.  CV: Nondisplaced PMI.  Heart regular S1/S2, no S3/S4, no murmur.  R and LLE 1+ edema  No carotid bruit.  Normal pedal pulses.  Abdomen: Soft, nontender, no hepatosplenomegaly, no distention.  Neurologic: Alert and oriented x 3.  Psych: Normal affect. Extremities: No clubbing or cyanosis. Lesions on toes noted.   TELEMETRY: Reviewed telemetry pt v-paced  ASSESSMENT AND PLAN: 65  yo with history of DM and smoking but has not had medical care in decades presented with acute systolic CHF.  Echo showed EF 25-30% with apical thrombus and LHC showed 3VD. 1. Ventricular fibrillation arrest: 9 shocks total yesterday morning, now on amiodarone and lidocaine gtts.  Likely scar-mediated VT/VF. Stop lidocaine drip today, continue amiodarone gtt today.  Likely to po amiodarone tomorrow.  2. Acute systolic CHF: EF 16% with regional WMAs (stable on echo yesterday), ischemic cardiomyopathy.  Now s/p CABG x 4.  He is volume overloaded.  Remains on milrinone. Co-ox today 62%. CVP 8.  - Cut back milrinone 0.125 mcg, he is also on digoxin. Repeat CO-OX at 1300. Add ted hose  - Lasix 40 mg IV every 8 hrs and replete K.  - Add spironolactone 12.5 mg daily.  3. CAD: Diffuse 3 vessel disease, see cath report above. MRI reviewed: LAD territory did not appear to have significant viability.  Remainder of LV myocardium did appear viable.  He had severe disease in LCx and RCA territory that may benefit from revascularization but suspect LAD territory will not recover.  He had CABG x 4 on 1/6.  Suspect ventricular fibrillation arrest 1/10 was scar-mediated as opposed to occlusion of graft, etc.  - Continue ASA, atorvastatin 80 daily.  4. LV mural thrombus: Seen again on 1/10 echo, mobile thrombus.  On heparin gtt, will transition to Eliquis prior to discharge.  Given mental illness, think coumadin may be difficult for him to manage.   5. Diabetes: Continue insulin.  6. Foot lesions: WOC consult appreciated. Continue dry dressing.  7. Diabetic neuropathy: Symptomatic neuropathy.  Added gabapentin 300 mg bid.  8. Deconditioning: Needs to mobilize today.    Amy Clegg NP-C  02/07/2015 7:20 AM   Patient seen with NP, agree with the above note. Weight down with IV Lasix, continue today (CVP about 8).  Decrease milrinone to 0.125 and check co-ox.  Continue digoxin.  If co-ox remains stable, try to stop  milrinone tomorrow.  Likely can transition to po amiodarone tomorrow as well.   Continue heparin gtt, transition to Eliquis when pacing wires out and patient nearing discharge.   [name] 02/07/2015 1:46 PM

## 2015-02-07 NOTE — Care Management Note (Signed)
Case Management Note  Patient Details  Name: Bradley Benitez MRN: [id number] Date of Birth: 02-05-1950  Subjective/Objective:    Noted that pt will eventually be prescribed Eliquis, attempted to determine copay.  Per CMA:  "Per rep from [employer]:   Generic: $4 30 day retail $8 60-90 day retail   Preferred $45 30 day retail $90 60-90 day retail   Specialty 33% of total cost of medication   Unable to provide specific drug information"    Will now attempt to determine if Eliquis is on preferred list.             Expected Discharge Plan:  Skilled Nursing Facility  In-House Referral:  Clinical Social Work  Discharge planning Services  CM Consult  Status of Service:  In process, will continue to follow  Medicare Important Message Given:  Yes  Magdalene RiverMayo, Dayona Shaheen T, RN 02/07/2015, 10:03 AM

## 2015-02-07 NOTE — Progress Notes (Signed)
ANTICOAGULATION CONSULT NOTE - Follow Up Consult  Pharmacy Consult for Heparin Indication: LV thrombus  Allergies  Allergen Reactions  . Codeine Other (See Comments)    intolerance    Patient Measurements: Height: 6\' 1"  (185.4 cm) Weight: 241 lb 2.9 oz (109.4 kg) IBW/kg (Calculated) : 79.9 Heparin Dosing Weight: ~100kg  Vital Signs: Temp: 97.7 F (36.5 C) (01/12 0739) Temp Source: Oral (01/12 0739) BP: 100/68 mmHg (01/12 0700) Pulse Rate: 88 (01/12 0742)  Labs:  Recent Labs  02/05/15 1300  02/06/15 0420 02/06/15 1212 02/06/15 1745 02/07/15 0413  HGB 8.9*  --  8.8*  --   --  8.8*  HCT 26.2*  --  25.8*  --   --  26.0*  PLT 220  --  271  --   --  294  HEPARINUNFRC  --   < > 0.29* 0.39 0.37 0.49  CREATININE 1.21  --  1.20  --   --  1.25*  < > = values in this interval not displayed.  Estimated Creatinine Clearance: 75.4 mL/min (by C-G formula based on Cr of 1.25).   Medications:  Heparin @ 1800 units/hr  Assessment: 64yom POD #6 CABG x4 was found to have a mobile LV apical thrombus on ECHO. IV heparin was started. Heparin level is therapeutic. Hemoglobin low but stable. No bleeding reported.  Goal of Therapy:  Heparin level 0.3-0.7 units/ml Monitor platelets by anticoagulation protocol: Yes   Plan:  1) Continue heparin at 1800 units/hr 2) Daily heparin level, CBC  [name], Oluwanifemi Petitti Sue 02/07/2015,9:01 AM

## 2015-02-07 NOTE — Care Management Important Message (Signed)
Important Message  Patient Details  Name: Oliver PilaDavid M Altland MRN: [id number] Date of Birth: 09/16/1950   Medicare Important Message Given:  Yes    Kyla BalzarineShealy, Heraclio Seidman Abena 02/07/2015, 12:23 PM

## 2015-02-08 LAB — BASIC METABOLIC PANEL
Anion gap: 6 (ref 5–15)
BUN: 23 mg/dL — ABNORMAL HIGH (ref 6–20)
CO2: 23 mmol/L (ref 22–32)
Calcium: 7.6 mg/dL — ABNORMAL LOW (ref 8.9–10.3)
Chloride: 110 mmol/L (ref 101–111)
Creatinine, Ser: 1.1 mg/dL (ref 0.61–1.24)
GFR calc Af Amer: >60 mL/min (ref >60)
GFR calc non Af Amer: >60 mL/min (ref >60)
Glucose, Bld: 90 mg/dL (ref 65–99)
Potassium: 3.8 mmol/L (ref 3.5–5.1)
Sodium: 139 mmol/L (ref 135–145)

## 2015-02-08 LAB — GLUCOSE, CAPILLARY
GLUCOSE-CAPILLARY: 161 mg/dL — AB (ref 65–99)
Glucose-Capillary: 97 mg/dL (ref 65–99)
Glucose-Capillary: 132 mg/dL — ABNORMAL HIGH (ref 65–99)
Glucose-Capillary: 149 mg/dL — ABNORMAL HIGH (ref 65–99)

## 2015-02-08 LAB — CBC
HCT: 27.4 % — ABNORMAL LOW (ref 39.0–52.0)
Hemoglobin: 9.1 g/dL — ABNORMAL LOW (ref 13.0–17.0)
MCH: 28.1 pg (ref 26.0–34.0)
MCHC: 33.2 g/dL (ref 30.0–36.0)
MCV: 84.6 fL (ref 78.0–100.0)
Platelets: 314 10*3/uL (ref 150–400)
RBC: 3.24 MIL/uL — ABNORMAL LOW (ref 4.22–5.81)
RDW: 14.1 % (ref 11.5–15.5)
WBC: 8.6 10*3/uL (ref 4.0–10.5)

## 2015-02-08 LAB — POCT I-STAT, CHEM 8
BUN: 22 mg/dL — AB (ref 6–20)
CALCIUM ION: 1.14 mmol/L (ref 1.13–1.30)
CHLORIDE: 103 mmol/L (ref 101–111)
Creatinine, Ser: 1 mg/dL (ref 0.61–1.24)
Glucose, Bld: 155 mg/dL — ABNORMAL HIGH (ref 65–99)
HEMATOCRIT: 28 % — AB (ref 39.0–52.0)
Hemoglobin: 9.5 g/dL — ABNORMAL LOW (ref 13.0–17.0)
Potassium: 4.3 mmol/L (ref 3.5–5.1)
SODIUM: 138 mmol/L (ref 135–145)
TCO2: 23 mmol/L (ref 0–100)

## 2015-02-08 LAB — HEPARIN LEVEL (UNFRACTIONATED): Heparin Unfractionated: 0.61 IU/mL (ref 0.30–0.70)

## 2015-02-08 LAB — CARBOXYHEMOGLOBIN
Carboxyhemoglobin: 1.4 % (ref 0.5–1.5)
Methemoglobin: 0.7 % (ref 0.0–1.5)
O2 Saturation: 53.9 %
Total hemoglobin: 15.8 g/dL (ref 13.5–18.0)

## 2015-02-08 MED ORDER — SPIRONOLACTONE 25 MG PO TABS
25.0000 mg | ORAL_TABLET | Freq: Every day | ORAL | Status: DC
  Administered 2015-02-08 – 2015-02-15 (×8): 25 mg via ORAL
  Filled 2015-02-08 (×7): qty 1

## 2015-02-08 MED ORDER — HEPARIN (PORCINE) IN NACL 100-0.45 UNIT/ML-% IJ SOLN
1600.0000 [IU]/h | INTRAMUSCULAR | Status: DC
  Administered 2015-02-08 – 2015-02-09 (×2): 1700 [IU]/h via INTRAVENOUS
  Filled 2015-02-08 (×3): qty 250

## 2015-02-08 MED ORDER — AMIODARONE HCL 200 MG PO TABS
400.0000 mg | ORAL_TABLET | Freq: Two times a day (BID) | ORAL | Status: DC
  Administered 2015-02-08 – 2015-02-10 (×5): 400 mg via ORAL
  Filled 2015-02-08 (×5): qty 2

## 2015-02-08 MED ORDER — FUROSEMIDE 10 MG/ML IJ SOLN
40.0000 mg | Freq: Three times a day (TID) | INTRAMUSCULAR | Status: DC
  Administered 2015-02-08 – 2015-02-12 (×12): 40 mg via INTRAVENOUS
  Filled 2015-02-08 (×12): qty 4

## 2015-02-08 NOTE — Care Management Note (Signed)
Case Management Note  Patient Details  Name: Bradley Benitez MRN: [id number] Date of Birth: December 19, 1950  Subjective/Objective:   With pt's current healthcare plan, Bradley Benitez and Bradley Benitez are Tier 3 and pt would be responsible for 33% of the cost - pt's income is very limited and he could not afford.  Multiple telephone calls to pt's healthcare plan and pharmaceutical company yielded "nothing we can do to change that" despite explanation of pt's situation.  Discussed situation with [employer] rep, Bradley Benitez who said pt can enroll in Patient Assistance Program even though he has Medicare.  Application taken to pt, sister-in-law, Bradley Benitez [phone]) will complete application and include pt's social security statement of income for CM to fax to [employer].  Provided card for free 30-day supply of Bradley Benitez.                         Expected Discharge Plan:  Skilled Nursing Facility  In-House Referral:  Clinical Social Work  Discharge planning Services  CM Consult  Status of Service:  In process, will continue to follow  Medicare Important Message Given:  Yes  Bradley Benitez, Bradley Larusso T, RN 02/08/2015, 1:27 PM

## 2015-02-08 NOTE — Progress Notes (Signed)
Patient ID: Bradley Benitez, male   DOB: 08-Aug-1950, 65 y.o.   MRN: [id number]   65 yo with history of DM and smoking but has not had medical care in decades presented with acute systolic CHF.  Echo showed EF 25-30% with apical thrombus and LHC showed 3VD.    LHC/RHC (12/16): RA mean 11 PA 35/11 PCWP mean 23 CI 2.59   Ost RPDA lesion, 70% stenosed.  1st RPLB lesion, 80% stenosed.  Mid Cx lesion, 80% stenosed.  Prox LAD lesion, 60% stenosed.  Mid LAD lesion, 95% stenosed.  Ost 2nd Diag lesion, 70% stenosed.  Ost Ramus to Ramus lesion, 90% stenosed  Cardiac MRI (1/17): 1. Severe LV systolic dysfunction, EF 26%. Wall motion abnormalities as above. 2. Normal RV size and systolic function . 3. LGE pattern suggestive of infarction in LAD territory. The mid anterior/anteroseptal and apical anterior/septal/inferior wall segments and the true apex do not appear viable (probably not likely to improve function with revascularization). The remainder of the left ventricle appears viable. 4. There is a small LV apical thrombus.  CABG x 4 on 1/6.   1/10 had ventricular fibrillation, received total of 9 shocks and ended up on amiodarone and lidocaine gtts.  Norepinephrine was started due to hypotension.  Patient was conscious at end of code.  Echo 1/10 with EF 25%, stable wall motion abnormalities, mild to moderately decreased RV systolic function, mobile LV thrombus again noted.   Now off lidocaine and norepinephrine. No further VT.  Remains on amiodarone gtt and milrinone 0.125 mcg/kg/min (weaned yesterday).  Co-ox 54% this morning. Weight down 1 lb.  CVP 8. Feels bloated.  Was able to walk around unit yesterday for first time.    Scheduled Meds: . amiodarone  400 mg Oral BID  . aspirin EC  325 mg Oral Daily  . atorvastatin  80 mg Oral q1800  . bisacodyl  10 mg Oral Daily   Or  . bisacodyl  10 mg Rectal Daily  . digoxin  0.125 mg Oral Daily  . docusate sodium  200 mg Oral  Daily  . feeding supplement (GLUCERNA SHAKE)  237 mL Oral TID BM  . furosemide  40 mg Intravenous Q12H  . insulin aspart  0-15 Units Subcutaneous TID WC  . insulin detemir  20 Units Subcutaneous Daily  . levalbuterol  0.63 mg Nebulization TID  . pantoprazole  40 mg Oral Daily  . potassium chloride  40 mEq Oral BID  . sodium chloride  3 mL Intravenous Q12H  . sodium chloride  3 mL Intravenous Q12H  . spironolactone  12.5 mg Oral Daily  . sucralfate  1 g Oral TID WC & HS   Continuous Infusions: . sodium chloride 250 mL (02/05/15 0730)  . sodium chloride 20 mL/hr at 02/08/15 0815  . heparin 1,800 Units/hr (02/08/15 0400)  . milrinone 0.125 mcg/kg/min (02/08/15 0400)   PRN Meds:.sodium chloride, metoprolol, ondansetron (ZOFRAN) IV, oxyCODONE, sodium chloride, sodium chloride, traMADol    Filed Vitals:   02/08/15 0600 02/08/15 0700 02/08/15 0744 02/08/15 0820  BP: 106/69 109/69    Pulse: 88 88 88 88  Temp:      TempSrc:      Resp: 19 20 19 22   Height:      Weight: 240 lb (108.863 kg)     SpO2: 93% 97% 98% 97%    Intake/Output Summary (Last 24 hours) at 02/08/15 0848 Last data filed at 02/08/15 0800  Gross per 24 hour  Intake 2225.7  ml  Output   2160 ml  Net   65.7 ml    LABS: Basic Metabolic Panel:  Recent Labs  16/10/96 0413 02/08/15 0405  NA 138 139  K 3.7 3.8  CL 108 110  CO2 23 23  GLUCOSE 124* 90  BUN 28* 23*  CREATININE 1.25* 1.10  CALCIUM 7.8* 7.6*   Liver Function Tests: No results for input(s): AST, ALT, ALKPHOS, BILITOT, PROT, ALBUMIN in the last 72 hours. No results for input(s): LIPASE, AMYLASE in the last 72 hours. CBC:  Recent Labs  02/07/15 0413 02/08/15 0405  WBC 8.4 8.6  HGB 8.8* 9.1*  HCT 26.0* 27.4*  MCV 82.8 84.6  PLT 294 314   Cardiac Enzymes: No results for input(s): CKTOTAL, CKMB, CKMBINDEX, TROPONINI in the last 72 hours. BNP: Invalid input(s): POCBNP D-Dimer: No results for input(s): DDIMER in the last 72  hours. Hemoglobin A1C: No results for input(s): HGBA1C in the last 72 hours. Fasting Lipid Panel: No results for input(s): CHOL, HDL, LDLCALC, TRIG, CHOLHDL, LDLDIRECT in the last 72 hours. Thyroid Function Tests: No results for input(s): TSH, T4TOTAL, T3FREE, THYROIDAB in the last 72 hours.  Invalid input(s): FREET3 Anemia Panel: No results for input(s): VITAMINB12, FOLATE, FERRITIN, TIBC, IRON, RETICCTPCT in the last 72 hours.  RADIOLOGY: Dg Chest 2 View  02/05/2015  CLINICAL DATA:  Shortness of breath, status post CABG 4 days ago. EXAM: CHEST  2 VIEW COMPARISON:  PA and lateral chest x-ray of February 04, 2015 FINDINGS: The left lung is well-expanded. There is minimal basilar atelectasis medially. On the right a small amount of pleural fluid remains. The cardiac silhouette is mildly enlarged. The pulmonary vascularity is not engorged. There is no pneumothorax or pneumomediastinum. There are 7 intact sternal wires. The right-sided PICC line tip projects over the junction of the distal SVC with the right atrium. IMPRESSION: Further interval improvement in pulmonary interstitial edema. Persistent left basilar subsegmental atelectasis and persistent trace right pleural effusion. Electronically Signed   By: Rhyder  [place] M.D.   On: 02/05/2015 07:53   Dg Chest 2 View  02/04/2015  CLINICAL DATA:  Acute respiratory failure, CHF, previous MI, former smoker. EXAM: CHEST  2 VIEW COMPARISON:  Portable chest x-ray of February 03, 2015 FINDINGS: The lungs are borderline hypoinflated. There is a trace of pleural fluid on the right. Right basilar atelectasis or pneumonia has improved. There remains retrocardiac subsegmental atelectasis but it has improved as well. The cardiac silhouette remains enlarged. The pulmonary vascularity is less engorged. There are post CABG changes. The right-sided PICC line tip projects over the distal third of the SVC. The right internal jugular Cordis sheath has been removed. IMPRESSION:  Mild interval improvement in bibasilar atelectasis or pneumonia. Trace right pleural effusion remains. Stable mild cardiomegaly with decreased pulmonary vascular congestion. Electronically Signed   By: Herson  [place] M.D.   On: 02/04/2015 08:04   Dg Chest 2 View  01/26/2015  CLINICAL DATA:  Shortness of breath this AM; h/o diabetes; former smoker EXAM: CHEST  2 VIEW COMPARISON:  01/25/2015 FINDINGS: Heart size is normal. There is prominence of interstitial markings, increased. Perihilar peribronchial thickening and noted. There are no focal consolidations. There are small bilateral pleural effusions. IMPRESSION: 1. Increased bronchitic changes. 2. Increased bilateral pleural effusions. Electronically Signed   By: Norva Pavlov M.D.   On: 01/26/2015 10:34   Dg Chest Port 1 View  02/07/2015  CLINICAL DATA:  CABG EXAM: PORTABLE CHEST 1 VIEW COMPARISON:  02/06/2015 FINDINGS:  Right PICC and right subclavian central venous catheter are stable. Normal heart size. Low volumes. Bibasilar atelectasis. Vascular congestion has resolved. Small pleural effusions are suspected. IMPRESSION: Resolved vascular congestion. Bibasilar atelectasis. Small pleural effusions persist. Electronically Signed   By: Jolaine ClickArthur  Hoss M.D.   On: 02/07/2015 07:36   Dg Chest Port 1 View  02/06/2015  CLINICAL DATA:  CABG. EXAM: PORTABLE CHEST 1 VIEW COMPARISON:  02/05/2015. FINDINGS: Right PICC line in stable position. Prior CABG. Cardiomegaly with pulmonary venous congestion and interstitial prominence, no change from prior exam. Findings consistent with stable changes of congestive heart failure. Small bilateral pleural effusions. No pneumothorax. IMPRESSION: 1. Right PICC line in stable position. 2. Prior CABG. Persistent changes of congestive heart failure with mild pulmonary interstitial edema and small pleural effusions. Electronically Signed   By: Maisie Fushomas  Register   On: 02/06/2015 07:20   Dg Chest Port 1 View  02/05/2015  CLINICAL  DATA:  Code. EXAM: PORTABLE CHEST 1 VIEW COMPARISON:  Earlier today at 627 hours.  One day prior. FINDINGS: 741 hours. Extensive artifact degradation due to overlying support apparatus, external pacer. A right sided PICC line terminates at the low SVC. Patient rotated left. Prior median sternotomy. Cardiomegaly accentuated by AP portable technique. Left costophrenic angle excluded. Probable layering right pleural effusion. No pneumothorax. Low lung volumes, accentuating interstitial edema. Developing right base airspace disease. IMPRESSION: Developing congestive heart failure. Probable small right pleural effusion with adjacent right base atelectasis. Extensive artifact degradation. Electronically Signed   By: Jeronimo GreavesKyle  Talbot M.D.   On: 02/05/2015 07:50   Dg Chest Port 1 View  02/03/2015  CLINICAL DATA:  Atelectasis EXAM: PORTABLE CHEST 1 VIEW COMPARISON:  Chest x-rays dated 02/02/2015 and 02/01/2015. FINDINGS: Left-sided chest tube is no longer seen. Swan-Ganz catheter is also no longer seen. Mediastinal drains have also been removed. Right IJ central line access site remains with tip projected over the upper SVC. Right-sided PICC line appears well positioned with tip projected over the lower SVC. Cardiomediastinal silhouette is stable in size and configuration. Cardiomegaly is stable. Median sternotomy wires are intact and stable in alignment, presumably for CABG. There is mild central pulmonary vascular congestion. Ill-defined bibasilar airspace opacities are not not significantly changed, presumably atelectasis and/or small effusions. No new lung findings. No pneumothorax seen. IMPRESSION: 1. Multiple tubes and lines removed, as above. 2. Persistent mild central pulmonary vascular congestion without evidence of overt pulmonary edema. 3. Persistent ill-defined bibasilar airspace opacities, presumably atelectasis and/or small effusions. 4. No new lung findings. 5. Stable cardiomegaly. Electronically Signed   By:  Bary RichardStan  Maynard M.D.   On: 02/03/2015 08:02   Dg Chest Port 1 View  02/02/2015  CLINICAL DATA:  65 year old male status post 4 vessel CABG EXAM: PORTABLE CHEST 1 VIEW COMPARISON:  Prior chest x-ray 02/01/2015 FINDINGS: The patient has been extubated and the nasogastric tube removed. A right IJ vascular sheath convey is a Swan-Ganz catheter into the heart. The tip of the Swan overlies the proximal right lower lobe pulmonary artery. A right upper extremity PICC is again noted in good position with the tip overlying the upper right atrium. Mediastinal and left thoracic drains are present via a subxiphoid approach. Patient is status post median sternotomy with evidence of multivessel CABG. Stable cardiomegaly. Mediastinal contours are grossly unchanged given lower inspiratory volumes. Lower inspiratory volumes with increased bibasilar opacities favored to reflect atelectasis. Pulmonary vascular congestion without overt edema. No evidence of pneumothorax or large effusion. No acute osseous abnormality. IMPRESSION: 1.  Interval extubation and removal of nasogastric tube. 2. Lower inspiratory volumes with increasing bibasilar atelectasis. 3. Pulmonary vascular congestion without overt edema. 4. Other support apparatus in stable and satisfactory position as above. Electronically Signed   By: Malachy Moan M.D.   On: 02/02/2015 08:54   Dg Chest Port 1 View  02/01/2015  CLINICAL DATA:  Patient status post CABG procedure. EXAM: PORTABLE CHEST 1 VIEW COMPARISON:  Chest radiograph 01/26/2015. FINDINGS: ET tube terminates in the mid trachea. Enteric tube courses inferior to the diaphragm. Right IJ approach pulmonary arterial catheter is present with tip projecting over the expected location of the right main pulmonary artery. Multiple mediastinal drains are present. Right upper extremity PICC line is present with tip projecting at the superior cavoatrial junction. Left chest tube in place. Stable cardiac and mediastinal  contours status post median sternotomy and CABG procedure. No large area of pulmonary consolidation. No definite pleural effusion or pneumothorax. No aggressive or acute appearing osseous lesions. IMPRESSION: Support apparatus as above. Electronically Signed   By: Annia Belt M.D.   On: 02/01/2015 16:04   Dg Chest Port 1 View  01/25/2015  CLINICAL DATA:  Acute respiratory failure with hypoxemia.  Fever. EXAM: PORTABLE CHEST 1 VIEW COMPARISON:  01/23/2015. FINDINGS: Grossly stable enlarged cardiac silhouette. Prominent pulmonary vasculature and interstitial markings with improvement. Interval small amount of linear density in the right mid lung zone. Minimal right pleural fluid. Central peribronchial thickening. Unremarkable bones. IMPRESSION: 1. Improving changes of congestive heart failure. 2. Small amount of linear atelectasis in the right mid lung zone. 3. Moderate bronchitic changes. Electronically Signed   By: Beckie Salts M.D.   On: 01/25/2015 16:34   Dg Chest Port 1 View  01/23/2015  CLINICAL DATA:  Congestive heart failure EXAM: PORTABLE CHEST 1 VIEW COMPARISON:  None. FINDINGS: There is generalized interstitial edema with patchy alveolar edema in the bases. Heart is enlarged with pulmonary venous hypertension. No adenopathy. IMPRESSION: Evidence of congestive heart failure with edema most pronounced in the lung bases. Electronically Signed   By: Bretta Bang III M.D.   On: 01/23/2015 09:34   Mr Card Morphology Wo/w Cm  01/29/2015  CLINICAL DATA:  CAD, assess for viability EXAM: CARDIAC MRI TECHNIQUE: The patient was scanned on a 1.5 Tesla GE magnet. A dedicated cardiac coil was used. Functional imaging was done using Fiesta sequences. 2,3, and 4 chamber views were done to assess for RWMA's. Modified Simpson's rule using a short axis stack was used to calculate an ejection fraction on a dedicated work [employer]. The patient received 34 cc of Multihance. After 10 minutes  inversion recovery sequences were used to assess for infiltration and scar tissue. CONTRAST:  34 cc Multihance FINDINGS: There were bilateral small to moderate pleural effusions. There was a trivial pericardial effusion. Normal left ventricular size. EF 26% with mid anteroseptal and mid anterior akinesis. The mid anterolateral wall was hypokinetic. The apical septal, inferior, and anterior segments were akinetic and the apical lateral segment was hypokinetic. The true apex was akinetic. A small apical thrombus was noted. The atria were normal in size. The right ventricle was normal in size and systolic function. The aortic valve was trileaflet with no stenosis or significant regurgitation. There did not appear to be significant mitral regurgitation. On delayed enhancement imaging, there was subendocardial 76-99% wall thickness late gadolinium enhancement (LGE) in the mid anteroseptal and mid anterior wall segments, in the apical septal/anterior/inferior wall segments, and in the true apex.  Basal segments without LGE, anterolateral wall without significant LGE, and mid inferior wall without LGE. MEASUREMENTS: MEASUREMENTS LV EDV 212 mL LV SV 54 mL LV EF 26% IMPRESSION: 1. Severe LV systolic dysfunction, EF 26%. Wall motion abnormalities as above. 2.  Normal RV size and systolic function . 3. LGE pattern suggestive of infarction in LAD territory. The mid anterior/anteroseptal and apical anterior/septal/inferior wall segments and the true apex do not appear viable (probably not likely to improve function with revascularization). The remainder of the left ventricle appears viable. 4.  There is a small LV apical thrombus. Anida Deol Electronically Signed   By: [name] M.D.   On: 01/29/2015 09:10    PHYSICAL EXAM CVP 8 General: NAD . In the chaiar.  Neck: JVP 8-9 cm, no thyromegaly or thyroid nodule.  Lungs: Decreased breath sounds at bases bilaterally.  CV: Nondisplaced PMI.  Heart regular S1/S2, no  S3/S4, no murmur.  R and LLE 1+ edema to knees  Abdomen: Soft, nontender, no hepatosplenomegaly, no distention.  Neurologic: Alert and oriented x 3.  Psych: Normal affect. Extremities: No clubbing or cyanosis. Lesions on toes noted.   TELEMETRY: Reviewed telemetry pt v-paced  ASSESSMENT AND PLAN: 65 yo with history of DM and smoking but has not had medical care in decades presented with acute systolic CHF.  Echo showed EF 25-30% with apical thrombus and LHC showed 3VD. 1. Ventricular fibrillation arrest: 9 shocks total yesterday morning, now on amiodarone and lidocaine gtts.  Likely scar-mediated VT/VF.  - Transition off amiodarone gtt onto amiodarone 400 mg po bid today.  2. Acute systolic CHF: EF 45% with regional WMAs (stable on echo yesterday), ischemic cardiomyopathy.  Now s/p CABG x 4.  He is volume overloaded by exam though CVP not markedly high at 8.  Milrinone weaned to 0.125, co-ox 54% today.   - Continue milrinone 0.125 today.  Will stop Coreg for now with low output (and on amiodarone).  Continue digoxin.  Repeat co-ox tomorrow, if 60% or above will stop milrinone.   - Would give Lasix at 40 mg IV every 8 hrs, I/Os even on bid Lasix.   - Can increase spironolactone to 25 daily.  3. CAD: Diffuse 3 vessel disease, see cath report above. MRI reviewed: LAD territory did not appear to have significant viability.  Remainder of LV myocardium did appear viable.  He had severe disease in LCx and RCA territory that may benefit from revascularization but suspect LAD territory will not recover.  He had CABG x 4 on 1/6.  Suspect ventricular fibrillation arrest 1/10 was scar-mediated as opposed to occlusion of graft, etc.  - Continue ASA, atorvastatin 80 daily.  4. LV mural thrombus: Seen again on 1/10 echo, mobile thrombus.  On heparin gtt, will transition to Eliquis prior to discharge.  Given mental illness, think coumadin may be difficult for him to manage.   5. Diabetes: Continue insulin.  6.  Foot lesions: WOC consult appreciated. Continue dry dressing.  7. Diabetic neuropathy: Symptomatic neuropathy.  Added gabapentin 300 mg bid.  8. Deconditioning: Continue work with cardiac rehab, needs to be encouraged to get out of bed.   9. Rhythm: Still has pacing wires in, v-pacing.   [name]  02/08/2015 8:48 AM

## 2015-02-08 NOTE — Progress Notes (Signed)
Physical Therapy Treatment Patient Details Name: GEOVONNI MEYERHOFF MRN: [id number] DOB: 1950-11-04 Today's Date: 02/08/2015    History of Present Illness 65 y/o male with diabetes but currently does not go to the doctor or take any medications presented to the [facility] emergency department on 01/23/2015 complaining of shortness of breath. In the emergency department he was noted to be profoundly hypoxemic, requiring BiPAP for respiratory assistance, he had an elevated lactic acid. He was given antibiotics for presumed sepsis.Tentative plan for CABG 02/01/2015.  S/p CABG 1/7, 1/10  vfib episode with 8-9 shock to get pt into rhthym.    PT Comments    Progressing slowly.  Pt self-limiting with talking and distractions in attempt to procrastinate.  He is capable of more and likely is fearful of pushing himself after the cardiac event early week.   Follow Up Recommendations  SNF     Equipment Recommendations  Other (comment)    Recommendations for Other Services       Precautions / Restrictions Precautions Precautions: Fall    Mobility  Bed Mobility                  Transfers Overall transfer level: Needs assistance Equipment used: 1 person hand held assist Transfers: Sit to/from Stand Sit to Stand: Min assist;+2 safety/equipment         General transfer comment: cues for hand placement based on sternal precautions.  Ambulation/Gait Ambulation/Gait assistance: Min guard;+2 safety/equipment Ambulation Distance (Feet): 140 Feet Assistive device:  (w/c to push) Gait Pattern/deviations: Step-through pattern     General Gait Details: Still self limiting, but after much procrastination he ambulated in the hall pushing w/c with flexed posture and short tentative steps   Stairs            Wheelchair Mobility    Modified Rankin (Stroke Patients Only)       Balance   Sitting-balance support: No upper extremity supported Sitting balance-Leahy Scale: Fair      Standing balance support: No upper extremity supported Standing balance-Leahy Scale: Poor Standing balance comment: reliant on the W/C                    Cognition Arousal/Alertness: Awake/alert Behavior During Therapy: WFL for tasks assessed/performed;Anxious Overall Cognitive Status: Within Functional Limits for tasks assessed                      Exercises General Exercises - Lower Extremity Hip Flexion/Marching: AROM;Both;Standing;5 reps    General Comments        Pertinent Vitals/Pain Pain Assessment: Faces Faces Pain Scale: Hurts little more Pain Location: paddle burn sites Pain Descriptors / Indicators: Grimacing Pain Intervention(s): Monitored during session    Home Living                      Prior Function            PT Goals (current goals can now be found in the care plan section) Acute Rehab PT Goals Patient Stated Goal: To get home, breathe better PT Goal Formulation: With patient Time For Goal Achievement: 02/13/15 Potential to Achieve Goals: Good Progress towards PT goals: Progressing toward goals (though slowly and self-limiting)    Frequency  Min 3X/week    PT Plan Current plan remains appropriate    Co-evaluation             End of Session   Activity Tolerance: Patient tolerated treatment well  Patient left: in chair;with call bell/phone within reach;with family/visitor present     Time: 1230-1257 PT Time Calculation (min) (ACUTE ONLY): 27 min  Charges:  $Gait Training: 8-22 mins (the rest of the time pt was procrasitinating)                    G Codes:      Anber Mckiver, [name] 02/08/2015, 2:27 PM 02/08/2015   BingKen Darla Mcdonald, PT [phone]  (pager)

## 2015-02-08 NOTE — Progress Notes (Signed)
7 Days Post-Op Procedure(s) (LRB): CORONARY ARTERY BYPASS GRAFTING (CABG)x 4 using left internal mammary artery and right greater saphenous leg vein using endoscope. (N/A) TRANSESOPHAGEAL ECHOCARDIOGRAM (TEE) (N/A)  CENTRIMAG VENTRICULAR ASSIST DEVICE, back up (N/A) Subjective: Postop day 7 CABG 4-preoperative severe ischemic cardiomyopathy with ejection fraction of 20% and class IV heart failure  Episodes of ventricular VT-VF postop day 3 from probable scar mediated ventricular arrhythmias treated with cardioversion and IV amiodarone. Now transition to oral amiodarone. No further ventricular arrhythmias. Patient has LV thrombus on IV heparin He is not a Coumadin candidate because of his mental illness and we'll attempt to transition to Eliquis at discharge after temporary pacing wires are removed. Volume overloaded on IV Lasix, receiving IV potassium as needed  Objective: Vital signs in last 24 hours: Temp:  [96.9 F (36.1 C)-97.8 F (36.6 C)] 96.9 F (36.1 C) (01/13 0900) Pulse Rate:  [87-89] 88 (01/13 0900) Cardiac Rhythm:  [-] Atrial paced (01/13 0900) Resp:  [13-29] 25 (01/13 0900) BP: (94-115)/(64-89) 96/70 mmHg (01/13 0900) SpO2:  [92 %-100 %] 98 % (01/13 0900) Weight:  [240 lb (108.863 kg)] 240 lb (108.863 kg) (01/13 0600)  Hemodynamic parameters for last 24 hours: CVP:  [2 mmHg-66 mmHg] 8 mmHg  Intake/Output from previous day: 01/12 0701 - 01/13 0700 In: 2406.5 [P.O.:1010; I.V.:1396.5] Out: 2150 [Urine:2150] Intake/Output this shift: Total I/O In: 117.6 [I.V.:117.6] Out: 60 [Urine:60]  Neuro intact Sternal incision stable and clean Extremities with moderate edema Lungs clear  Lab Results:  Recent Labs  02/07/15 0413 02/08/15 0405  WBC 8.4 8.6  HGB 8.8* 9.1*  HCT 26.0* 27.4*  PLT 294 314   BMET:  Recent Labs  02/07/15 0413 02/08/15 0405  NA 138 139  K 3.7 3.8  CL 108 110  CO2 23 23  GLUCOSE 124* 90  BUN 28* 23*  CREATININE 1.25* 1.10  CALCIUM  7.8* 7.6*    PT/INR: No results for input(s): LABPROT, INR in the last 72 hours. ABG    Component Value Date/Time   PHART 7.449 02/06/2015 0647   HCO3 22.5 02/06/2015 0647   TCO2 24 02/06/2015 0647   ACIDBASEDEF 1.0 02/06/2015 0647   O2SAT 53.9 02/08/2015 1000   CBG (last 3)   Recent Labs  02/07/15 1704 02/07/15 2201 02/08/15 0913  GLUCAP 134* 125* 97    Assessment/Plan: S/P Procedure(s) (LRB): CORONARY ARTERY BYPASS GRAFTING (CABG)x 4 using left internal mammary artery and right greater saphenous leg vein using endoscope. (N/A) TRANSESOPHAGEAL ECHOCARDIOGRAM (TEE) (N/A)  CENTRIMAG VENTRICULAR ASSIST DEVICE, back up (N/A) Keep in ICU because of severe ischemic cardiomyopathy, IV milrinone requirement, history of ventricular arrhythmias and cardioversion  Continue  pharmacologic therapy, physical therapy, diabetic control, advance diet   LOS: 16 days    Kathlee Nationseter Van Trigt III 02/08/2015

## 2015-02-08 NOTE — Progress Notes (Signed)
Utilization Review Completed.  

## 2015-02-08 NOTE — Progress Notes (Signed)
ANTICOAGULATION CONSULT NOTE - Follow Up Consult  Pharmacy Consult for Heparin Indication: LV thrombus  Allergies  Allergen Reactions  . Codeine Other (See Comments)    intolerance    Patient Measurements: Height: 6\' 1"  (185.4 cm) Weight: 240 lb (108.863 kg) IBW/kg (Calculated) : 79.9 Heparin Dosing Weight: ~100kg  Vital Signs: Temp: 96.8 F (36 C) (01/13 1100) Temp Source: Axillary (01/13 1100) BP: 96/70 mmHg (01/13 0900) Pulse Rate: 88 (01/13 0900)  Labs:  Recent Labs  02/06/15 0420  02/06/15 1745 02/07/15 0413 02/08/15 0405  HGB 8.8*  --   --  8.8* 9.1*  HCT 25.8*  --   --  26.0* 27.4*  PLT 271  --   --  294 314  HEPARINUNFRC 0.29*  < > 0.37 0.49 0.61  CREATININE 1.20  --   --  1.25* 1.10  < > = values in this interval not displayed.  Estimated Creatinine Clearance: 85.5 mL/min (by C-G formula based on Cr of 1.1).   Medications:  Heparin @ 1800 units/hr  Assessment: 64yom POD #7 CABG x4 was found to have a mobile LV apical thrombus on ECHO. IV heparin was started. Heparin level is therapeutic, however, Dr. Donata ClayVan Trigt requesting to aim for a lower goal of 0.3-0.5 due to recent surgery so will decrease rate slightly. Hemoglobin low but stable. No bleeding reported.  Goal of Therapy:  Heparin level 0.3-0.5 Monitor platelets by anticoagulation protocol: Yes   Plan:  1) Decrease heparin to 1700 units/hr 2) Daily heparin level, CBC  [name], Damione Robideau Sue 02/08/2015,11:52 AM

## 2015-02-09 ENCOUNTER — Inpatient Hospital Stay (HOSPITAL_COMMUNITY): Payer: PPO

## 2015-02-09 DIAGNOSIS — I472 Ventricular tachycardia: Secondary | ICD-10-CM

## 2015-02-09 LAB — CBC
HCT: 28.1 % — ABNORMAL LOW (ref 39.0–52.0)
Hemoglobin: 9.3 g/dL — ABNORMAL LOW (ref 13.0–17.0)
MCH: 28.2 pg (ref 26.0–34.0)
MCHC: 33.1 g/dL (ref 30.0–36.0)
MCV: 85.2 fL (ref 78.0–100.0)
Platelets: 288 10*3/uL (ref 150–400)
RBC: 3.3 MIL/uL — ABNORMAL LOW (ref 4.22–5.81)
RDW: 14.4 % (ref 11.5–15.5)
WBC: 8.3 10*3/uL (ref 4.0–10.5)

## 2015-02-09 LAB — CARBOXYHEMOGLOBIN
Carboxyhemoglobin: 1.9 % — ABNORMAL HIGH (ref 0.5–1.5)
Methemoglobin: 0.7 % (ref 0.0–1.5)
O2 SAT: 53.4 %
TOTAL HEMOGLOBIN: 9.2 g/dL — AB (ref 13.5–18.0)

## 2015-02-09 LAB — BASIC METABOLIC PANEL
Anion gap: 6 (ref 5–15)
BUN: 19 mg/dL (ref 6–20)
CO2: 26 mmol/L (ref 22–32)
Calcium: 8.2 mg/dL — ABNORMAL LOW (ref 8.9–10.3)
Chloride: 107 mmol/L (ref 101–111)
Creatinine, Ser: 1.11 mg/dL (ref 0.61–1.24)
GFR calc Af Amer: >60 mL/min (ref >60)
GFR calc non Af Amer: >60 mL/min (ref >60)
Glucose, Bld: 136 mg/dL — ABNORMAL HIGH (ref 65–99)
Potassium: 4.4 mmol/L (ref 3.5–5.1)
Sodium: 139 mmol/L (ref 135–145)

## 2015-02-09 LAB — GLUCOSE, CAPILLARY
GLUCOSE-CAPILLARY: 118 mg/dL — AB (ref 65–99)
GLUCOSE-CAPILLARY: 146 mg/dL — AB (ref 65–99)
GLUCOSE-CAPILLARY: 136 mg/dL — AB (ref 65–99)
Glucose-Capillary: 201 mg/dL — ABNORMAL HIGH (ref 65–99)

## 2015-02-09 LAB — HEPARIN LEVEL (UNFRACTIONATED): Heparin Unfractionated: 0.51 IU/mL (ref 0.30–0.70)

## 2015-02-09 MED ORDER — CHLORHEXIDINE GLUCONATE 4 % EX LIQD
Freq: Every day | CUTANEOUS | Status: DC
  Administered 2015-02-14 – 2015-02-15 (×2): via TOPICAL
  Filled 2015-02-09: qty 118

## 2015-02-09 MED ORDER — LOSARTAN POTASSIUM 25 MG PO TABS
12.5000 mg | ORAL_TABLET | Freq: Two times a day (BID) | ORAL | Status: DC
  Administered 2015-02-09 (×2): 12.5 mg via ORAL
  Filled 2015-02-09 (×2): qty 1

## 2015-02-09 MED ORDER — GERHARDT'S BUTT CREAM
TOPICAL_CREAM | CUTANEOUS | Status: DC | PRN
  Filled 2015-02-09: qty 1

## 2015-02-09 NOTE — Progress Notes (Signed)
8 Days Post-Op Procedure(s) (LRB): CORONARY ARTERY BYPASS GRAFTING (CABG)x 4 using left internal mammary artery and right greater saphenous leg vein using endoscope. (N/A) TRANSESOPHAGEAL ECHOCARDIOGRAM (TEE) (N/A)  CENTRIMAG VENTRICULAR ASSIST DEVICE, back up (N/A) Subjective: Continues to progress Excellent diuresis No vent ectopy Will DC milrinone and tx to stepdown Cont iv heparin until EPWs ready to be pulled  Objective: Vital signs in last 24 hours: Temp:  [96.8 F (36 C)-98.3 F (36.8 C)] 97.9 F (36.6 C) (01/14 0741) Pulse Rate:  [87-88] 88 (01/14 0700) Cardiac Rhythm:  [-] Atrial paced (01/14 0400) Resp:  [12-26] 25 (01/14 0700) BP: (97-118)/(63-78) 118/73 mmHg (01/14 0700) SpO2:  [94 %-100 %] 99 % (01/14 0736) Weight:  [236 lb (107.049 kg)] 236 lb (107.049 kg) (01/14 0500)  Hemodynamic parameters for last 24 hours: CVP:  [6 mmHg] 6 mmHg  Intake/Output from previous day: 01/13 0701 - 01/14 0700 In: 1521.5 [P.O.:480; I.V.:1041.5] Out: 2685 [Urine:2685] Intake/Output this shift: Total I/O In: -  Out: 600 [Urine:600]  Edema better nsr  Lab Results:  Recent Labs  02/08/15 0405 02/08/15 1714 02/09/15 0430  WBC 8.6  --  8.3  HGB 9.1* 9.5* 9.3*  HCT 27.4* 28.0* 28.1*  PLT 314  --  288   BMET:  Recent Labs  02/08/15 0405 02/08/15 1714 02/09/15 0430  NA 139 138 139  K 3.8 4.3 4.4  CL 110 103 107  CO2 23  --  26  GLUCOSE 90 155* 136*  BUN 23* 22* 19  CREATININE 1.10 1.00 1.11  CALCIUM 7.6*  --  8.2*    PT/INR: No results for input(s): LABPROT, INR in the last 72 hours. ABG    Component Value Date/Time   PHART 7.449 02/06/2015 0647   HCO3 22.5 02/06/2015 0647   TCO2 23 02/08/2015 1714   ACIDBASEDEF 1.0 02/06/2015 0647   O2SAT 53.4 02/09/2015 0440   CBG (last 3)   Recent Labs  02/08/15 1629 02/08/15 2144 02/09/15 0736  GLUCAP 149* 161* 118*    Assessment/Plan: S/P Procedure(s) (LRB): CORONARY ARTERY BYPASS GRAFTING (CABG)x 4 using  left internal mammary artery and right greater saphenous leg vein using endoscope. (N/A) TRANSESOPHAGEAL ECHOCARDIOGRAM (TEE) (N/A)  CENTRIMAG VENTRICULAR ASSIST DEVICE, back up (N/A) Mobilize Diuresis Plan for transfer to step-down: see transfer orders   LOS: 17 days    Kathlee Nationseter Van Trigt III 02/09/2015

## 2015-02-09 NOTE — Progress Notes (Signed)
Pt arrived to unit. No complaints of pain. VSS. Pt oriented to unit. Wil continue to monitor closely.  Sandrea HammondJunris Corben Auzenne RN

## 2015-02-09 NOTE — Progress Notes (Signed)
CT surgery p.m. Rounds  Patient examined and record reviewed.Hemodynamics stable,labs satisfactory.Patient had stable day.Continue current care. Kathlee Nationseter Van Trigt III 02/09/2015

## 2015-02-09 NOTE — Progress Notes (Addendum)
Patient ID: Bradley Benitez, male   DOB: [birth year], 65 y.o.   MRN: [id number]   65 yo with history of DM and smoking but has not had medical care in decades presented with acute systolic CHF.  Echo showed EF 25-30% with apical thrombus and LHC showed 3VD.  Underwent CABG 1/6. On 1/10 had VF arrest with defib x 9.   Feels great today. No CP or SOB. Diuresing well on IV lasix. Weight down 4 pounds overnight. Co-ox 53%. Milrinone stopped by Dr. Donata Clay.  Rhythm stable.      Scheduled Meds: . amiodarone  400 mg Oral BID  . aspirin EC  325 mg Oral Daily  . atorvastatin  80 mg Oral q1800  . bisacodyl  10 mg Oral Daily   Or  . bisacodyl  10 mg Rectal Daily  . digoxin  0.125 mg Oral Daily  . docusate sodium  200 mg Oral Daily  . feeding supplement (GLUCERNA SHAKE)  237 mL Oral TID BM  . furosemide  40 mg Intravenous 3 times per day  . insulin aspart  0-15 Units Subcutaneous TID WC  . insulin detemir  20 Units Subcutaneous Daily  . levalbuterol  0.63 mg Nebulization TID  . pantoprazole  40 mg Oral Daily  . potassium chloride  40 mEq Oral BID  . sodium chloride  3 mL Intravenous Q12H  . spironolactone  25 mg Oral Daily  . sucralfate  1 g Oral TID WC & HS   Continuous Infusions: . sodium chloride 250 mL (02/05/15 0730)  . sodium chloride 20 mL/hr at 02/09/15 1154  . heparin 1,700 Units/hr (02/09/15 1154)   PRN Meds:.metoprolol, ondansetron (ZOFRAN) IV, oxyCODONE, traMADol    Filed Vitals:   02/09/15 0900 02/09/15 1000 02/09/15 1100 02/09/15 1159  BP:   116/75   Pulse: 88 89 88   Temp:    97.7 F (36.5 C)  TempSrc:    Oral  Resp: 18 21 20    Height:      Weight:      SpO2: 100% 100% 100%     Intake/Output Summary (Last 24 hours) at 02/09/15 1255 Last data filed at 02/09/15 1100  Gross per 24 hour  Intake 1357.1 ml  Output   2600 ml  Net -1242.9 ml    LABS: Basic Metabolic Panel:  Recent Labs  40/98/11 0405 02/08/15 1714 02/09/15 0430  NA 139 138 139  K 3.8 4.3  4.4  CL 110 103 107  CO2 23  --  26  GLUCOSE 90 155* 136*  BUN 23* 22* 19  CREATININE 1.10 1.00 1.11  CALCIUM 7.6*  --  8.2*   Liver Function Tests: No results for input(s): AST, ALT, ALKPHOS, BILITOT, PROT, ALBUMIN in the last 72 hours. No results for input(s): LIPASE, AMYLASE in the last 72 hours. CBC:  Recent Labs  02/08/15 0405 02/08/15 1714 02/09/15 0430  WBC 8.6  --  8.3  HGB 9.1* 9.5* 9.3*  HCT 27.4* 28.0* 28.1*  MCV 84.6  --  85.2  PLT 314  --  288   Cardiac Enzymes: No results for input(s): CKTOTAL, CKMB, CKMBINDEX, TROPONINI in the last 72 hours. BNP: Invalid input(s): POCBNP D-Dimer: No results for input(s): DDIMER in the last 72 hours. Hemoglobin A1C: No results for input(s): HGBA1C in the last 72 hours. Fasting Lipid Panel: No results for input(s): CHOL, HDL, LDLCALC, TRIG, CHOLHDL, LDLDIRECT in the last 72 hours. Thyroid Function Tests: No results for input(s): TSH, T4TOTAL,  T3FREE, THYROIDAB in the last 72 hours.  Invalid input(s): FREET3 Anemia Panel: No results for input(s): VITAMINB12, FOLATE, FERRITIN, TIBC, IRON, RETICCTPCT in the last 72 hours.  RADIOLOGY: Dg Chest 2 View  02/05/2015  CLINICAL DATA:  Shortness of breath, status post CABG 4 days ago. EXAM: CHEST  2 VIEW COMPARISON:  PA and lateral chest x-ray of February 04, 2015 FINDINGS: The left lung is well-expanded. There is minimal basilar atelectasis medially. On the right a small amount of pleural fluid remains. The cardiac silhouette is mildly enlarged. The pulmonary vascularity is not engorged. There is no pneumothorax or pneumomediastinum. There are 7 intact sternal wires. The right-sided PICC line tip projects over the junction of the distal SVC with the right atrium. IMPRESSION: Further interval improvement in pulmonary interstitial edema. Persistent left basilar subsegmental atelectasis and persistent trace right pleural effusion. Electronically Signed   By: Tonny  [place] M.D.   On:  02/05/2015 07:53   Dg Chest 2 View  02/04/2015  CLINICAL DATA:  Acute respiratory failure, CHF, previous MI, former smoker. EXAM: CHEST  2 VIEW COMPARISON:  Portable chest x-ray of February 03, 2015 FINDINGS: The lungs are borderline hypoinflated. There is a trace of pleural fluid on the right. Right basilar atelectasis or pneumonia has improved. There remains retrocardiac subsegmental atelectasis but it has improved as well. The cardiac silhouette remains enlarged. The pulmonary vascularity is less engorged. There are post CABG changes. The right-sided PICC line tip projects over the distal third of the SVC. The right internal jugular Cordis sheath has been removed. IMPRESSION: Mild interval improvement in bibasilar atelectasis or pneumonia. Trace right pleural effusion remains. Stable mild cardiomegaly with decreased pulmonary vascular congestion. Electronically Signed   By: Justinn  [place] M.D.   On: 02/04/2015 08:04   Dg Chest 2 View  01/26/2015  CLINICAL DATA:  Shortness of breath this AM; h/o diabetes; former smoker EXAM: CHEST  2 VIEW COMPARISON:  01/25/2015 FINDINGS: Heart size is normal. There is prominence of interstitial markings, increased. Perihilar peribronchial thickening and noted. There are no focal consolidations. There are small bilateral pleural effusions. IMPRESSION: 1. Increased bronchitic changes. 2. Increased bilateral pleural effusions. Electronically Signed   By: Norva Pavlov M.D.   On: 01/26/2015 10:34   Dg Chest Port 1 View  02/09/2015  CLINICAL DATA:  Short of breath EXAM: PORTABLE CHEST 1 VIEW COMPARISON:  02/07/2015 FINDINGS: Normal heart size. Lungs under aerated with bibasilar hypoaeration. Small pleural effusions may be present. No pneumothorax. Right subclavian central venous catheter tip at the cavoatrial junction. IMPRESSION: Bibasilar atelectasis Small pleural effusions may be present. Electronically Signed   By: Jolaine Click M.D.   On: 02/09/2015 10:32   Dg Chest  Port 1 View  02/07/2015  CLINICAL DATA:  CABG EXAM: PORTABLE CHEST 1 VIEW COMPARISON:  02/06/2015 FINDINGS: Right PICC and right subclavian central venous catheter are stable. Normal heart size. Low volumes. Bibasilar atelectasis. Vascular congestion has resolved. Small pleural effusions are suspected. IMPRESSION: Resolved vascular congestion. Bibasilar atelectasis. Small pleural effusions persist. Electronically Signed   By: Jolaine Click M.D.   On: 02/07/2015 07:36   Dg Chest Port 1 View  02/06/2015  CLINICAL DATA:  CABG. EXAM: PORTABLE CHEST 1 VIEW COMPARISON:  02/05/2015. FINDINGS: Right PICC line in stable position. Prior CABG. Cardiomegaly with pulmonary venous congestion and interstitial prominence, no change from prior exam. Findings consistent with stable changes of congestive heart failure. Small bilateral pleural effusions. No pneumothorax. IMPRESSION: 1. Right PICC line in stable  position. 2. Prior CABG. Persistent changes of congestive heart failure with mild pulmonary interstitial edema and small pleural effusions. Electronically Signed   By: Maisie Fushomas  Register   On: 02/06/2015 07:20   Dg Chest Port 1 View  02/05/2015  CLINICAL DATA:  Code. EXAM: PORTABLE CHEST 1 VIEW COMPARISON:  Earlier today at 627 hours.  One day prior. FINDINGS: 741 hours. Extensive artifact degradation due to overlying support apparatus, external pacer. A right sided PICC line terminates at the low SVC. Patient rotated left. Prior median sternotomy. Cardiomegaly accentuated by AP portable technique. Left costophrenic angle excluded. Probable layering right pleural effusion. No pneumothorax. Low lung volumes, accentuating interstitial edema. Developing right base airspace disease. IMPRESSION: Developing congestive heart failure. Probable small right pleural effusion with adjacent right base atelectasis. Extensive artifact degradation. Electronically Signed   By: Jeronimo GreavesKyle  Talbot M.D.   On: 02/05/2015 07:50   Dg Chest Port 1  View  02/03/2015  CLINICAL DATA:  Atelectasis EXAM: PORTABLE CHEST 1 VIEW COMPARISON:  Chest x-rays dated 02/02/2015 and 02/01/2015. FINDINGS: Left-sided chest tube is no longer seen. Swan-Ganz catheter is also no longer seen. Mediastinal drains have also been removed. Right IJ central line access site remains with tip projected over the upper SVC. Right-sided PICC line appears well positioned with tip projected over the lower SVC. Cardiomediastinal silhouette is stable in size and configuration. Cardiomegaly is stable. Median sternotomy wires are intact and stable in alignment, presumably for CABG. There is mild central pulmonary vascular congestion. Ill-defined bibasilar airspace opacities are not not significantly changed, presumably atelectasis and/or small effusions. No new lung findings. No pneumothorax seen. IMPRESSION: 1. Multiple tubes and lines removed, as above. 2. Persistent mild central pulmonary vascular congestion without evidence of overt pulmonary edema. 3. Persistent ill-defined bibasilar airspace opacities, presumably atelectasis and/or small effusions. 4. No new lung findings. 5. Stable cardiomegaly. Electronically Signed   By: Bary RichardStan  Maynard M.D.   On: 02/03/2015 08:02   Dg Chest Port 1 View  02/02/2015  CLINICAL DATA:  65 year old male status post 4 vessel CABG EXAM: PORTABLE CHEST 1 VIEW COMPARISON:  Prior chest x-ray 02/01/2015 FINDINGS: The patient has been extubated and the nasogastric tube removed. A right IJ vascular sheath convey is a Swan-Ganz catheter into the heart. The tip of the Swan overlies the proximal right lower lobe pulmonary artery. A right upper extremity PICC is again noted in good position with the tip overlying the upper right atrium. Mediastinal and left thoracic drains are present via a subxiphoid approach. Patient is status post median sternotomy with evidence of multivessel CABG. Stable cardiomegaly. Mediastinal contours are grossly unchanged given lower inspiratory  volumes. Lower inspiratory volumes with increased bibasilar opacities favored to reflect atelectasis. Pulmonary vascular congestion without overt edema. No evidence of pneumothorax or large effusion. No acute osseous abnormality. IMPRESSION: 1. Interval extubation and removal of nasogastric tube. 2. Lower inspiratory volumes with increasing bibasilar atelectasis. 3. Pulmonary vascular congestion without overt edema. 4. Other support apparatus in stable and satisfactory position as above. Electronically Signed   By: Malachy MoanHeath  McCullough M.D.   On: 02/02/2015 08:54   Dg Chest Port 1 View  02/01/2015  CLINICAL DATA:  Patient status post CABG procedure. EXAM: PORTABLE CHEST 1 VIEW COMPARISON:  Chest radiograph 01/26/2015. FINDINGS: ET tube terminates in the mid trachea. Enteric tube courses inferior to the diaphragm. Right IJ approach pulmonary arterial catheter is present with tip projecting over the expected location of the right main pulmonary artery. Multiple mediastinal drains are present.  Right upper extremity PICC line is present with tip projecting at the superior cavoatrial junction. Left chest tube in place. Stable cardiac and mediastinal contours status post median sternotomy and CABG procedure. No large area of pulmonary consolidation. No definite pleural effusion or pneumothorax. No aggressive or acute appearing osseous lesions. IMPRESSION: Support apparatus as above. Electronically Signed   By: Annia Belt M.D.   On: 02/01/2015 16:04   Dg Chest Port 1 View  01/25/2015  CLINICAL DATA:  Acute respiratory failure with hypoxemia.  Fever. EXAM: PORTABLE CHEST 1 VIEW COMPARISON:  01/23/2015. FINDINGS: Grossly stable enlarged cardiac silhouette. Prominent pulmonary vasculature and interstitial markings with improvement. Interval small amount of linear density in the right mid lung zone. Minimal right pleural fluid. Central peribronchial thickening. Unremarkable bones. IMPRESSION: 1. Improving changes of  congestive heart failure. 2. Small amount of linear atelectasis in the right mid lung zone. 3. Moderate bronchitic changes. Electronically Signed   By: Beckie Salts M.D.   On: 01/25/2015 16:34   Dg Chest Port 1 View  01/23/2015  CLINICAL DATA:  Congestive heart failure EXAM: PORTABLE CHEST 1 VIEW COMPARISON:  None. FINDINGS: There is generalized interstitial edema with patchy alveolar edema in the bases. Heart is enlarged with pulmonary venous hypertension. No adenopathy. IMPRESSION: Evidence of congestive heart failure with edema most pronounced in the lung bases. Electronically Signed   By: Bretta Bang III M.D.   On: 01/23/2015 09:34   Mr Card Morphology Wo/w Cm  01/29/2015  CLINICAL DATA:  CAD, assess for viability EXAM: CARDIAC MRI TECHNIQUE: The patient was scanned on a 1.5 Tesla GE magnet. A dedicated cardiac coil was used. Functional imaging was done using Fiesta sequences. 2,3, and 4 chamber views were done to assess for RWMA's. Modified Simpson's rule using a short axis stack was used to calculate an ejection fraction on a dedicated work [employer]. The patient received 34 cc of Multihance. After 10 minutes inversion recovery sequences were used to assess for infiltration and scar tissue. CONTRAST:  34 cc Multihance FINDINGS: There were bilateral small to moderate pleural effusions. There was a trivial pericardial effusion. Normal left ventricular size. EF 26% with mid anteroseptal and mid anterior akinesis. The mid anterolateral wall was hypokinetic. The apical septal, inferior, and anterior segments were akinetic and the apical lateral segment was hypokinetic. The true apex was akinetic. A small apical thrombus was noted. The atria were normal in size. The right ventricle was normal in size and systolic function. The aortic valve was trileaflet with no stenosis or significant regurgitation. There did not appear to be significant mitral regurgitation. On delayed enhancement  imaging, there was subendocardial 76-99% wall thickness late gadolinium enhancement (LGE) in the mid anteroseptal and mid anterior wall segments, in the apical septal/anterior/inferior wall segments, and in the true apex. Basal segments without LGE, anterolateral wall without significant LGE, and mid inferior wall without LGE. MEASUREMENTS: MEASUREMENTS LV EDV 212 mL LV SV 54 mL LV EF 26% IMPRESSION: 1. Severe LV systolic dysfunction, EF 26%. Wall motion abnormalities as above. 2.  Normal RV size and systolic function . 3. LGE pattern suggestive of infarction in LAD territory. The mid anterior/anteroseptal and apical anterior/septal/inferior wall segments and the true apex do not appear viable (probably not likely to improve function with revascularization). The remainder of the left ventricle appears viable. 4.  There is a small LV apical thrombus. Dalton Mclean Electronically Signed   By: [name] M.D.   On: 01/29/2015 09:10  PHYSICAL EXAM General: NAD . In the chair.  Neck: JVP jaw, no thyromegaly or thyroid nodule.  Lungs: Decreased breath sounds at bases bilaterally.  CV: Nondisplaced PMI.  Heart regular S1/S2, no S3/S4, no murmur.  R and LLE 2+ edema  No carotid bruit.  Normal pedal pulses.  Abdomen: Soft, nontender, no hepatosplenomegaly, no distention.  Neurologic: Alert and oriented x 3.  Psych: Normal affect. Extremities: No clubbing or cyanosis. Lesions on toes noted.   TELEMETRY: Reviewed telemetry pt v-paced no VT  ASSESSMENT AND PLAN: 65 yo with history of DM and smoking but has not had medical care in decades presented with acute systolic CHF.  Echo showed EF 25-30% with apical thrombus and LHC showed 3VD. 1. Ventricular fibrillation arrest:  -suspect scar-mediatd - tele reviewed. No further VT. On po amio - K > 4.0  2. Acute systolic CHF: EF 16% with regional WMAs (stable on echo yesterday), ischemic cardiomyopathy.  Now s/p CABG x 4.  He is volume overloaded.  -  Milrinone stopped. But co-ox borderline.Will follow.May need to restart. Start low-dose losartan. No b-blocker yet - Volume status improving but still about 10 pounds up from baseline - Lasix 40 mg IV every 8 hrs and replete K.  - Contine spironolactone 12.5 mg daily.  3. CAD: Diffuse 3 vessel disease, see cath report above. MRI reviewed: LAD territory did not appear to have significant viability.  Remainder of LV myocardium did appear viable.  He had severe disease in LCx and RCA territory that may benefit from revascularization but suspect LAD territory will not recover.  He had CABG x 4 on 1/6.  Suspect ventricular fibrillation arrest 1/10 was scar-mediated as opposed to occlusion of graft, etc.  - Continue ASA, atorvastatin 80 daily.  4. LV mural thrombus: Seen again on 1/10 echo, mobile thrombus.  On heparin gtt, will transition to Eliquis prior to discharge.  Given mental illness, think coumadin may be difficult for him to manage.   5. Diabetes: Continue insulin.  6. Foot lesions: WOC consult appreciated. Continue dry dressing.  7. Diabetic neuropathy: Symptomatic neuropathy.  On gabapentin 300 mg bid.  8. Deconditioning: Continue to mobilize   Arvilla Meres MD  02/09/2015 12:55 PM

## 2015-02-09 NOTE — Progress Notes (Signed)
ANTICOAGULATION CONSULT NOTE - Follow Up Consult  Pharmacy Consult for Heparin Indication: LV thrombus  Allergies  Allergen Reactions  . Codeine Other (See Comments)    intolerance    Patient Measurements: Height: 6\' 1"  (185.4 cm) Weight: 236 lb (107.049 kg) IBW/kg (Calculated) : 79.9 Heparin Dosing Weight: ~100kg  Vital Signs: Temp: 97.7 F (36.5 C) (01/14 1159) Temp Source: Oral (01/14 1159) BP: 116/75 mmHg (01/14 1100) Pulse Rate: 88 (01/14 1100)  Labs:  Recent Labs  02/07/15 0413 02/08/15 0405 02/08/15 1714 02/09/15 0430  HGB 8.8* 9.1* 9.5* 9.3*  HCT 26.0* 27.4* 28.0* 28.1*  PLT 294 314  --  288  HEPARINUNFRC 0.49 0.61  --  0.51  CREATININE 1.25* 1.10 1.00 1.11    Estimated Creatinine Clearance: 84 mL/min (by C-G formula based on Cr of 1.11).   Medications:  Heparin @ 1700 units/hr  Assessment: 64yom s/p CABG x4 was found to have a mobile LV apical thrombus on ECHO. IV heparin was started with plans for apixiban closer to discharge. -Heparin level= 0.51  Goal of Therapy:  Heparin level 0.3-0.5 Monitor platelets by anticoagulation protocol: Yes   Plan:  -Decrease heparin to 1600 units/hr -Daily heparin level, CBC  Harland GermanAndrew Hayes Czaja, Pharm D 02/09/2015 2:48 PM

## 2015-02-10 ENCOUNTER — Inpatient Hospital Stay (HOSPITAL_COMMUNITY): Payer: PPO

## 2015-02-10 LAB — HEPARIN LEVEL (UNFRACTIONATED): Heparin Unfractionated: 0.47 IU/mL (ref 0.30–0.70)

## 2015-02-10 LAB — GLUCOSE, CAPILLARY
GLUCOSE-CAPILLARY: 141 mg/dL — AB (ref 65–99)
Glucose-Capillary: 126 mg/dL — ABNORMAL HIGH (ref 65–99)
Glucose-Capillary: 121 mg/dL — ABNORMAL HIGH (ref 65–99)
Glucose-Capillary: 106 mg/dL — ABNORMAL HIGH (ref 65–99)

## 2015-02-10 LAB — BASIC METABOLIC PANEL
ANION GAP: 7 (ref 5–15)
BUN: 16 mg/dL (ref 6–20)
CALCIUM: 8.4 mg/dL — AB (ref 8.9–10.3)
CO2: 27 mmol/L (ref 22–32)
Chloride: 104 mmol/L (ref 101–111)
Creatinine, Ser: 1.17 mg/dL (ref 0.61–1.24)
Glucose, Bld: 117 mg/dL — ABNORMAL HIGH (ref 65–99)
Potassium: 4.2 mmol/L (ref 3.5–5.1)
SODIUM: 138 mmol/L (ref 135–145)

## 2015-02-10 LAB — CBC
HCT: 29 % — ABNORMAL LOW (ref 39.0–52.0)
Hemoglobin: 9.6 g/dL — ABNORMAL LOW (ref 13.0–17.0)
MCH: 28 pg (ref 26.0–34.0)
MCHC: 33.1 g/dL (ref 30.0–36.0)
MCV: 84.5 fL (ref 78.0–100.0)
Platelets: 295 10*3/uL (ref 150–400)
RBC: 3.43 MIL/uL — ABNORMAL LOW (ref 4.22–5.81)
RDW: 14.3 % (ref 11.5–15.5)
WBC: 9 10*3/uL (ref 4.0–10.5)

## 2015-02-10 LAB — CARBOXYHEMOGLOBIN
CARBOXYHEMOGLOBIN: 1.7 % — AB (ref 0.5–1.5)
Methemoglobin: 0.9 % (ref 0.0–1.5)
O2 Saturation: 52.1 %
Total hemoglobin: 9.7 g/dL — ABNORMAL LOW (ref 13.5–18.0)

## 2015-02-10 MED ORDER — ENOXAPARIN SODIUM 60 MG/0.6ML ~~LOC~~ SOLN
50.0000 mg | Freq: Two times a day (BID) | SUBCUTANEOUS | Status: DC
Start: 2015-02-10 — End: 2015-02-10

## 2015-02-10 MED ORDER — LOSARTAN POTASSIUM 25 MG PO TABS
25.0000 mg | ORAL_TABLET | Freq: Two times a day (BID) | ORAL | Status: DC
  Administered 2015-02-10 – 2015-02-13 (×8): 25 mg via ORAL
  Filled 2015-02-10 (×8): qty 1

## 2015-02-10 MED ORDER — CEPHALEXIN 500 MG PO CAPS
500.0000 mg | ORAL_CAPSULE | Freq: Three times a day (TID) | ORAL | Status: DC
  Administered 2015-02-10 – 2015-02-12 (×6): 500 mg via ORAL
  Filled 2015-02-10 (×6): qty 1

## 2015-02-10 MED ORDER — ENOXAPARIN SODIUM 60 MG/0.6ML ~~LOC~~ SOLN
50.0000 mg | Freq: Two times a day (BID) | SUBCUTANEOUS | Status: DC
  Administered 2015-02-10 – 2015-02-11 (×2): 50 mg via SUBCUTANEOUS
  Filled 2015-02-10 (×2): qty 0.6

## 2015-02-10 MED ORDER — METOLAZONE 2.5 MG PO TABS
2.5000 mg | ORAL_TABLET | Freq: Every day | ORAL | Status: DC
  Administered 2015-02-10 – 2015-02-11 (×2): 2.5 mg via ORAL
  Filled 2015-02-10 (×2): qty 1

## 2015-02-10 MED ORDER — ENSURE ENLIVE PO LIQD: 237.0000 mL | Freq: Two times a day (BID) | ORAL | Status: DC

## 2015-02-10 MED ORDER — AMIODARONE HCL 200 MG PO TABS
200.0000 mg | ORAL_TABLET | Freq: Two times a day (BID) | ORAL | Status: DC
Start: 2015-02-10 — End: 2015-02-13
  Administered 2015-02-10 – 2015-02-13 (×6): 200 mg via ORAL
  Filled 2015-02-10 (×6): qty 1

## 2015-02-10 MED ORDER — SODIUM CHLORIDE 0.9 % IJ SOLN
10.0000 mL | INTRAMUSCULAR | Status: DC | PRN
  Administered 2015-02-10 (×2): 20 mL
  Administered 2015-02-11 – 2015-02-13 (×6): 10 mL
  Filled 2015-02-10 (×7): qty 40

## 2015-02-10 NOTE — Progress Notes (Addendum)
Patient's right lower leg incision bled a scant amount. It has stopped as of now. Will inform the night nurse and continue to monitor. Dorsalis pedal pulses were felt on both feet.

## 2015-02-10 NOTE — Progress Notes (Addendum)
[address]       [place] [address]             [phone]      9 Days Post-Op Procedure(s) (LRB): CORONARY ARTERY BYPASS GRAFTING (CABG)x 4 using left internal mammary artery and right greater saphenous leg vein using endoscope. (N/A) TRANSESOPHAGEAL ECHOCARDIOGRAM (TEE) (N/A)  CENTRIMAG VENTRICULAR ASSIST DEVICE, back up (N/A)   Subjective:  Mr. Snuffer complains of "shooting" pain down his right leg.  He otherwise has no complaints.  Objective: Vital signs in last 24 hours: Temp:  [97.6 F (36.4 C)-98.2 F (36.8 C)] 97.7 F (36.5 C) (01/15 0601) Pulse Rate:  [86-89] 88 (01/15 0601) Cardiac Rhythm:  [-] Atrial paced (01/14 2240) Resp:  [17-33] 20 (01/15 0601) BP: (107-121)/(64-78) 108/67 mmHg (01/15 0601) SpO2:  [97 %-100 %] 98 % (01/15 0601) Weight:  [232 lb 12.8 oz (105.597 kg)] 232 lb 12.8 oz (105.597 kg) (01/15 0601)  Intake/Output from previous day: 01/14 0701 - 01/15 0700 In: 664 [P.O.:180; I.V.:484] Out: 3850 [Urine:3850]  General appearance: alert, cooperative and no distress Heart: regular rate and rhythm and paced Lungs: clear to auscultation bilaterally Abdomen: soft, non-tender; bowel sounds normal; no masses,  no organomegaly Extremities: edema RLE >LLE, RLE ecchymotic, tender to palpation in calf, Wound: clean and dry  Lab Results:  Recent Labs  02/09/15 0430 02/10/15 0448  WBC 8.3 9.0  HGB 9.3* 9.6*  HCT 28.1* 29.0*  PLT 288 295   BMET:  Recent Labs  02/09/15 0430 02/10/15 0448  NA 139 138  K 4.4 4.2  CL 107 104  CO2 26 27  GLUCOSE 136* 117*  BUN 19 16  CREATININE 1.11 1.17  CALCIUM 8.2* 8.4*    PT/INR: No results for input(s): LABPROT, INR in the last 72 hours. ABG    Component Value Date/Time   PHART 7.449 02/06/2015 0647   HCO3 22.5 02/06/2015 0647   TCO2 23 02/08/2015 1714   ACIDBASEDEF 1.0 02/06/2015 0647   O2SAT 52.1 02/10/2015 0449   CBG (last 3)   Recent Labs  02/09/15 1549  02/09/15 2121 02/10/15 0636  GLUCAP 136* 201* 126*    Assessment/Plan: S/P Procedure(s) (LRB): CORONARY ARTERY BYPASS GRAFTING (CABG)x 4 using left internal mammary artery and right greater saphenous leg vein using endoscope. (N/A) TRANSESOPHAGEAL ECHOCARDIOGRAM (TEE) (N/A)  CENTRIMAG VENTRICULAR ASSIST DEVICE, back up (N/A)  1. CV- remains paced, sinus brady under pacer- AHF assisting in management- continue Amiodarone, Digoxin, Cozaar 2. Pulm- off oxygen, no acute issues, CXR looks good 3. Renal-creatinine 1.17, continue diuretics per HF 4. ID- question by Dr. Jones Broom of RLE cellulitis at [facility] site... I dont think this is infected, I am concerned about the ecchymosis and location of pain, possible DVT could be present will get venous duplex 5. Dispo- continue current care, duplex to assess for possible DVT   LOS: 18 days    BARRETT, ERIN 02/10/2015  Patient's heart rate is slow and requires atrial pacing, he is not on a beta blocker because of his low EF so will reduce amiodarone dosing.  Fluid retention much better with diuretics - creatinine remained stable Blood sugars are well controlled Surgical incisions clean and dry but there is some erythema around the right leg harvest site and the patient is been started on oral Keflex Continuous IV heparin infusion with monitoring of levels has been very problematic for the patient. We will transition to every 12 hours Lovenox injections. When his  temporary pacing wires can be removed we will then start Eliquis And stop the Lovenox. He'll need to go to a skilled nursing facility because of his  unaccompanied living situation at home .  Kathlee NationsPeter Van trigt M.D.

## 2015-02-10 NOTE — Progress Notes (Signed)
Central line was removed per order. Patient tolerated well. BP was 105/66. There was scant bleeding which stopped under pressure. Pressure was help for 10 mins. Will continue to monitor.

## 2015-02-10 NOTE — Progress Notes (Signed)
Attempted to meet with Pt to discuss d/c plan.  Pt working with medical staff.  Providence CrosbyAmanda Tequia Wolman, LCSW Clinical Social Work [phone]

## 2015-02-10 NOTE — Progress Notes (Signed)
Patient ID: Bradley Benitez, male   DOB: 1950-03-27, 65 y.o.   MRN: [id number]   65 yo with history of DM and smoking but has not had medical care in decades presented with acute systolic CHF.  Echo showed EF 25-30% with apical thrombus and LHC showed 3VD.  Underwent CABG 1/6. On 1/10 had VF arrest with defib x 9.   Continues to feel well. Weight down another 4 pounds with IV lasix. No CP or SOB. Co-ox 52%.  Rhythm stable. Creatinine stable.   Scheduled Meds: . amiodarone  400 mg Oral BID  . aspirin EC  325 mg Oral Daily  . atorvastatin  80 mg Oral q1800  . bisacodyl  10 mg Oral Daily   Or  . bisacodyl  10 mg Rectal Daily  . chlorhexidine   Topical Daily  . digoxin  0.125 mg Oral Daily  . docusate sodium  200 mg Oral Daily  . feeding supplement (GLUCERNA SHAKE)  237 mL Oral TID BM  . furosemide  40 mg Intravenous 3 times per day  . insulin aspart  0-15 Units Subcutaneous TID WC  . insulin detemir  20 Units Subcutaneous Daily  . levalbuterol  0.63 mg Nebulization TID  . losartan  12.5 mg Oral BID  . pantoprazole  40 mg Oral Daily  . potassium chloride  40 mEq Oral BID  . sodium chloride  3 mL Intravenous Q12H  . spironolactone  25 mg Oral Daily  . sucralfate  1 g Oral TID WC & HS   Continuous Infusions: . sodium chloride 250 mL (02/05/15 0730)  . sodium chloride 20 mL/hr at 02/09/15 1154  . heparin 1,600 Units/hr (02/09/15 1449)   PRN Meds:.Gerhardt's butt cream, ondansetron (ZOFRAN) IV, oxyCODONE, sodium chloride, traMADol    Filed Vitals:   02/09/15 2100 02/09/15 2200 02/09/15 2252 02/10/15 0601  BP: 112/69 115/69 120/70 108/67  Pulse: 88 87 89 88  Temp:   98.2 F (36.8 C) 97.7 F (36.5 C)  TempSrc:   Oral Oral  Resp: 21 21 20 20   Height:      Weight:    105.597 kg (232 lb 12.8 oz)  SpO2: 100% 100% 100% 98%    Intake/Output Summary (Last 24 hours) at 02/10/15 0816 Last data filed at 02/10/15 0604  Gross per 24 hour  Intake 562.92 ml  Output   3250 ml  Net  -2687.08 ml    LABS: Basic Metabolic Panel:  Recent Labs  40/98/11 0430 02/10/15 0448  NA 139 138  K 4.4 4.2  CL 107 104  CO2 26 27  GLUCOSE 136* 117*  BUN 19 16  CREATININE 1.11 1.17  CALCIUM 8.2* 8.4*   Liver Function Tests: No results for input(s): AST, ALT, ALKPHOS, BILITOT, PROT, ALBUMIN in the last 72 hours. No results for input(s): LIPASE, AMYLASE in the last 72 hours. CBC:  Recent Labs  02/09/15 0430 02/10/15 0448  WBC 8.3 9.0  HGB 9.3* 9.6*  HCT 28.1* 29.0*  MCV 85.2 84.5  PLT 288 295   Cardiac Enzymes: No results for input(s): CKTOTAL, CKMB, CKMBINDEX, TROPONINI in the last 72 hours. BNP: Invalid input(s): POCBNP D-Dimer: No results for input(s): DDIMER in the last 72 hours. Hemoglobin A1C: No results for input(s): HGBA1C in the last 72 hours. Fasting Lipid Panel: No results for input(s): CHOL, HDL, LDLCALC, TRIG, CHOLHDL, LDLDIRECT in the last 72 hours. Thyroid Function Tests: No results for input(s): TSH, T4TOTAL, T3FREE, THYROIDAB in the last 72 hours.  Invalid input(s): FREET3 Anemia Panel: No results for input(s): VITAMINB12, FOLATE, FERRITIN, TIBC, IRON, RETICCTPCT in the last 72 hours.  RADIOLOGY: Dg Chest 2 View  02/05/2015  CLINICAL DATA:  Shortness of breath, status post CABG 4 days ago. EXAM: CHEST  2 VIEW COMPARISON:  PA and lateral chest x-ray of February 04, 2015 FINDINGS: The left lung is well-expanded. There is minimal basilar atelectasis medially. On the right a small amount of pleural fluid remains. The cardiac silhouette is mildly enlarged. The pulmonary vascularity is not engorged. There is no pneumothorax or pneumomediastinum. There are 7 intact sternal wires. The right-sided PICC line tip projects over the junction of the distal SVC with the right atrium. IMPRESSION: Further interval improvement in pulmonary interstitial edema. Persistent left basilar subsegmental atelectasis and persistent trace right pleural effusion. Electronically  Signed   By: Jalon  [place] M.D.   On: 02/05/2015 07:53   Dg Chest 2 View  02/04/2015  CLINICAL DATA:  Acute respiratory failure, CHF, previous MI, former smoker. EXAM: CHEST  2 VIEW COMPARISON:  Portable chest x-ray of February 03, 2015 FINDINGS: The lungs are borderline hypoinflated. There is a trace of pleural fluid on the right. Right basilar atelectasis or pneumonia has improved. There remains retrocardiac subsegmental atelectasis but it has improved as well. The cardiac silhouette remains enlarged. The pulmonary vascularity is less engorged. There are post CABG changes. The right-sided PICC line tip projects over the distal third of the SVC. The right internal jugular Cordis sheath has been removed. IMPRESSION: Mild interval improvement in bibasilar atelectasis or pneumonia. Trace right pleural effusion remains. Stable mild cardiomegaly with decreased pulmonary vascular congestion. Electronically Signed   By: Dreyton  [place] M.D.   On: 02/04/2015 08:04   Dg Chest 2 View  01/26/2015  CLINICAL DATA:  Shortness of breath this AM; h/o diabetes; former smoker EXAM: CHEST  2 VIEW COMPARISON:  01/25/2015 FINDINGS: Heart size is normal. There is prominence of interstitial markings, increased. Perihilar peribronchial thickening and noted. There are no focal consolidations. There are small bilateral pleural effusions. IMPRESSION: 1. Increased bronchitic changes. 2. Increased bilateral pleural effusions. Electronically Signed   By: Norva Pavlov M.D.   On: 01/26/2015 10:34   Dg Chest Port 1 View  02/09/2015  CLINICAL DATA:  Short of breath EXAM: PORTABLE CHEST 1 VIEW COMPARISON:  02/07/2015 FINDINGS: Normal heart size. Lungs under aerated with bibasilar hypoaeration. Small pleural effusions may be present. No pneumothorax. Right subclavian central venous catheter tip at the cavoatrial junction. IMPRESSION: Bibasilar atelectasis Small pleural effusions may be present. Electronically Signed   By: Jolaine Click M.D.    On: 02/09/2015 10:32   Dg Chest Port 1 View  02/07/2015  CLINICAL DATA:  CABG EXAM: PORTABLE CHEST 1 VIEW COMPARISON:  02/06/2015 FINDINGS: Right PICC and right subclavian central venous catheter are stable. Normal heart size. Low volumes. Bibasilar atelectasis. Vascular congestion has resolved. Small pleural effusions are suspected. IMPRESSION: Resolved vascular congestion. Bibasilar atelectasis. Small pleural effusions persist. Electronically Signed   By: Jolaine Click M.D.   On: 02/07/2015 07:36   Dg Chest Port 1 View  02/06/2015  CLINICAL DATA:  CABG. EXAM: PORTABLE CHEST 1 VIEW COMPARISON:  02/05/2015. FINDINGS: Right PICC line in stable position. Prior CABG. Cardiomegaly with pulmonary venous congestion and interstitial prominence, no change from prior exam. Findings consistent with stable changes of congestive heart failure. Small bilateral pleural effusions. No pneumothorax. IMPRESSION: 1. Right PICC line in stable position. 2. Prior CABG. Persistent changes of congestive  heart failure with mild pulmonary interstitial edema and small pleural effusions. Electronically Signed   By: Maisie Fus  Register   On: 02/06/2015 07:20   Dg Chest Port 1 View  02/05/2015  CLINICAL DATA:  Code. EXAM: PORTABLE CHEST 1 VIEW COMPARISON:  Earlier today at 627 hours.  One day prior. FINDINGS: 741 hours. Extensive artifact degradation due to overlying support apparatus, external pacer. A right sided PICC line terminates at the low SVC. Patient rotated left. Prior median sternotomy. Cardiomegaly accentuated by AP portable technique. Left costophrenic angle excluded. Probable layering right pleural effusion. No pneumothorax. Low lung volumes, accentuating interstitial edema. Developing right base airspace disease. IMPRESSION: Developing congestive heart failure. Probable small right pleural effusion with adjacent right base atelectasis. Extensive artifact degradation. Electronically Signed   By: Jeronimo Greaves M.D.   On:  02/05/2015 07:50   Dg Chest Port 1 View  02/03/2015  CLINICAL DATA:  Atelectasis EXAM: PORTABLE CHEST 1 VIEW COMPARISON:  Chest x-rays dated 02/02/2015 and 02/01/2015. FINDINGS: Left-sided chest tube is no longer seen. Swan-Ganz catheter is also no longer seen. Mediastinal drains have also been removed. Right IJ central line access site remains with tip projected over the upper SVC. Right-sided PICC line appears well positioned with tip projected over the lower SVC. Cardiomediastinal silhouette is stable in size and configuration. Cardiomegaly is stable. Median sternotomy wires are intact and stable in alignment, presumably for CABG. There is mild central pulmonary vascular congestion. Ill-defined bibasilar airspace opacities are not not significantly changed, presumably atelectasis and/or small effusions. No new lung findings. No pneumothorax seen. IMPRESSION: 1. Multiple tubes and lines removed, as above. 2. Persistent mild central pulmonary vascular congestion without evidence of overt pulmonary edema. 3. Persistent ill-defined bibasilar airspace opacities, presumably atelectasis and/or small effusions. 4. No new lung findings. 5. Stable cardiomegaly. Electronically Signed   By: Bary Richard M.D.   On: 02/03/2015 08:02   Dg Chest Port 1 View  02/02/2015  CLINICAL DATA:  65 year old male status post 4 vessel CABG EXAM: PORTABLE CHEST 1 VIEW COMPARISON:  Prior chest x-ray 02/01/2015 FINDINGS: The patient has been extubated and the nasogastric tube removed. A right IJ vascular sheath convey is a Swan-Ganz catheter into the heart. The tip of the Swan overlies the proximal right lower lobe pulmonary artery. A right upper extremity PICC is again noted in good position with the tip overlying the upper right atrium. Mediastinal and left thoracic drains are present via a subxiphoid approach. Patient is status post median sternotomy with evidence of multivessel CABG. Stable cardiomegaly. Mediastinal contours are  grossly unchanged given lower inspiratory volumes. Lower inspiratory volumes with increased bibasilar opacities favored to reflect atelectasis. Pulmonary vascular congestion without overt edema. No evidence of pneumothorax or large effusion. No acute osseous abnormality. IMPRESSION: 1. Interval extubation and removal of nasogastric tube. 2. Lower inspiratory volumes with increasing bibasilar atelectasis. 3. Pulmonary vascular congestion without overt edema. 4. Other support apparatus in stable and satisfactory position as above. Electronically Signed   By: Malachy Moan M.D.   On: 02/02/2015 08:54   Dg Chest Port 1 View  02/01/2015  CLINICAL DATA:  Patient status post CABG procedure. EXAM: PORTABLE CHEST 1 VIEW COMPARISON:  Chest radiograph 01/26/2015. FINDINGS: ET tube terminates in the mid trachea. Enteric tube courses inferior to the diaphragm. Right IJ approach pulmonary arterial catheter is present with tip projecting over the expected location of the right main pulmonary artery. Multiple mediastinal drains are present. Right upper extremity PICC line is present with  tip projecting at the superior cavoatrial junction. Left chest tube in place. Stable cardiac and mediastinal contours status post median sternotomy and CABG procedure. No large area of pulmonary consolidation. No definite pleural effusion or pneumothorax. No aggressive or acute appearing osseous lesions. IMPRESSION: Support apparatus as above. Electronically Signed   By: Annia Beltrew  Davis M.D.   On: 02/01/2015 16:04   Dg Chest Port 1 View  01/25/2015  CLINICAL DATA:  Acute respiratory failure with hypoxemia.  Fever. EXAM: PORTABLE CHEST 1 VIEW COMPARISON:  01/23/2015. FINDINGS: Grossly stable enlarged cardiac silhouette. Prominent pulmonary vasculature and interstitial markings with improvement. Interval small amount of linear density in the right mid lung zone. Minimal right pleural fluid. Central peribronchial thickening. Unremarkable bones.  IMPRESSION: 1. Improving changes of congestive heart failure. 2. Small amount of linear atelectasis in the right mid lung zone. 3. Moderate bronchitic changes. Electronically Signed   By: Beckie SaltsSteven  Reid M.D.   On: 01/25/2015 16:34   Dg Chest Port 1 View  01/23/2015  CLINICAL DATA:  Congestive heart failure EXAM: PORTABLE CHEST 1 VIEW COMPARISON:  None. FINDINGS: There is generalized interstitial edema with patchy alveolar edema in the bases. Heart is enlarged with pulmonary venous hypertension. No adenopathy. IMPRESSION: Evidence of congestive heart failure with edema most pronounced in the lung bases. Electronically Signed   By: Bretta BangWilliam  Woodruff III M.D.   On: 01/23/2015 09:34   Mr Card Morphology Wo/w Cm  01/29/2015  CLINICAL DATA:  CAD, assess for viability EXAM: CARDIAC MRI TECHNIQUE: The patient was scanned on a 1.5 Tesla GE magnet. A dedicated cardiac coil was used. Functional imaging was done using Fiesta sequences. 2,3, and 4 chamber views were done to assess for RWMA's. Modified Simpson's rule using a short axis stack was used to calculate an ejection fraction on a dedicated work Research officer, trade unionstation using Circle software. The patient received 34 cc of Multihance. After 10 minutes inversion recovery sequences were used to assess for infiltration and scar tissue. CONTRAST:  34 cc Multihance FINDINGS: There were bilateral small to moderate pleural effusions. There was a trivial pericardial effusion. Normal left ventricular size. EF 26% with mid anteroseptal and mid anterior akinesis. The mid anterolateral wall was hypokinetic. The apical septal, inferior, and anterior segments were akinetic and the apical lateral segment was hypokinetic. The true apex was akinetic. A small apical thrombus was noted. The atria were normal in size. The right ventricle was normal in size and systolic function. The aortic valve was trileaflet with no stenosis or significant regurgitation. There did not appear to be significant mitral  regurgitation. On delayed enhancement imaging, there was subendocardial 76-99% wall thickness late gadolinium enhancement (LGE) in the mid anteroseptal and mid anterior wall segments, in the apical septal/anterior/inferior wall segments, and in the true apex. Basal segments without LGE, anterolateral wall without significant LGE, and mid inferior wall without LGE. MEASUREMENTS: MEASUREMENTS LV EDV 212 mL LV SV 54 mL LV EF 26% IMPRESSION: 1. Severe LV systolic dysfunction, EF 26%. Wall motion abnormalities as above. 2.  Normal RV size and systolic function . 3. LGE pattern suggestive of infarction in LAD territory. The mid anterior/anteroseptal and apical anterior/septal/inferior wall segments and the true apex do not appear viable (probably not likely to improve function with revascularization). The remainder of the left ventricle appears viable. 4.  There is a small LV apical thrombus. Dalton Mclean Electronically Signed   By: Marca Anconaalton  Mclean M.D.   On: 01/29/2015 09:10    PHYSICAL EXAM General: NAD .  In the chair.  Neck: JVP jaw, no thyromegaly or thyroid nodule.  Lungs: Decreased breath sounds at bases bilaterally.  CV: Nondisplaced PMI.  Heart regular S1/S2, no S3/S4, no murmur. Some erythema around SVG harvest site on L. R and LLE 2+ edema  No carotid bruit.  Normal pedal pulses.  Abdomen: Soft, nontender, no hepatosplenomegaly, no distention.  Neurologic: Alert and oriented x 3.  Psych: Normal affect. Extremities: No clubbing or cyanosis. Lesions on toes noted.   TELEMETRY: Reviewed telemetry pt v-paced no VT  ASSESSMENT AND PLAN: 65 yo with history of DM and smoking but has not had medical care in decades presented with acute systolic CHF.  Echo showed EF 25-30% with apical thrombus and LHC showed 3VD. 1. Ventricular fibrillation arrest:  -suspect scar-mediatd - tele reviewed. No further VT. On po amio - K > 4.0  2. Acute systolic CHF: EF 46% with regional WMAs, ischemic cardiomyopathy.   Now s/p CABG x 4.   - Milrinone stopped yesterday. Co-ox borderline but he feels ok and renal function started. Losartan started yesterday. No b-blocker yet - Volume status improving but still about 10 pounds up from baseline - Continue lasix 40 mg IV every 8 hrs and replete K. Add daily metolazone.  - Increase spironolactone to 25 mg daily.  - Re 3. CAD: Diffuse 3 vessel disease, see cath report above. MRI reviewed: LAD territory did not appear to have significant viability.  Remainder of LV myocardium did appear viable.  He had severe disease in LCx and RCA territory that may benefit from revascularization but suspect LAD territory will not recover.  He had CABG x 4 on 1/6.  Suspect ventricular fibrillation arrest 1/10 was scar-mediated as opposed to occlusion of graft, etc.  - Continue ASA, atorvastatin 80 daily.  4. LV mural thrombus: Seen again on 1/10 echo, mobile thrombus.  On heparin gtt, will transition to Eliquis prior to discharge.  Given mental illness, think coumadin may be difficult for him to manage.   5. Diabetes: Continue insulin.  6. Foot lesions: WOC consult appreciated. Continue dry dressing.  7. Diabetic neuropathy: Symptomatic neuropathy.  On gabapentin 300 mg bid.  8. Deconditioning: Continue to mobilize   Arvilla Meres MD  02/10/2015 8:16 AM

## 2015-02-10 NOTE — Progress Notes (Signed)
ANTICOAGULATION CONSULT NOTE - Follow Up Consult  Pharmacy Consult for Heparin Indication: LV thrombus  Allergies  Allergen Reactions  . Codeine Other (See Comments)    intolerance    Patient Measurements: Height: 6\' 1"  (185.4 cm) Weight: 232 lb 12.8 oz (105.597 kg) IBW/kg (Calculated) : 79.9 Heparin Dosing Weight: ~100kg  Vital Signs: Temp: 97.7 F (36.5 C) (01/15 0601) Temp Source: Oral (01/15 0601) BP: 108/67 mmHg (01/15 0601) Pulse Rate: 88 (01/15 1009)  Labs:  Recent Labs  02/08/15 0405 02/08/15 1714 02/09/15 0430 02/10/15 0448  HGB 9.1* 9.5* 9.3* 9.6*  HCT 27.4* 28.0* 28.1* 29.0*  PLT 314  --  288 295  HEPARINUNFRC 0.61  --  0.51 0.47  CREATININE 1.10 1.00 1.11 1.17    Estimated Creatinine Clearance: 79.2 mL/min (by C-G formula based on Cr of 1.17).   Medications:  Heparin @ 1600 units/hr  Assessment: 64yom s/p CABG x4 was found to have a mobile LV apical thrombus on ECHO. IV heparin was started with plans for apixiban closer to discharge. -Heparin level= 0.47  Goal of Therapy:  Heparin level 0.3-0.5 Monitor platelets by anticoagulation protocol: Yes   Plan:  -Decrease heparin to 1600 units/hr -Daily heparin level, CBC  Harland GermanAndrew Keeghan Bialy, Pharm D 02/10/2015 11:26 AM

## 2015-02-11 ENCOUNTER — Ambulatory Visit (HOSPITAL_COMMUNITY): Payer: PPO

## 2015-02-11 DIAGNOSIS — M79661 Pain in right lower leg: Secondary | ICD-10-CM

## 2015-02-11 LAB — BASIC METABOLIC PANEL
ANION GAP: 6 (ref 5–15)
BUN: 14 mg/dL (ref 6–20)
CALCIUM: 8.7 mg/dL — AB (ref 8.9–10.3)
CO2: 29 mmol/L (ref 22–32)
Chloride: 104 mmol/L (ref 101–111)
Creatinine, Ser: 1.28 mg/dL — ABNORMAL HIGH (ref 0.61–1.24)
GFR, EST NON AFRICAN AMERICAN: 57 mL/min — AB (ref >60)
GLUCOSE: 114 mg/dL — AB (ref 65–99)
Potassium: 4.2 mmol/L (ref 3.5–5.1)
Sodium: 139 mmol/L (ref 135–145)

## 2015-02-11 LAB — GLUCOSE, CAPILLARY
GLUCOSE-CAPILLARY: 108 mg/dL — AB (ref 65–99)
GLUCOSE-CAPILLARY: 119 mg/dL — AB (ref 65–99)
GLUCOSE-CAPILLARY: 101 mg/dL — AB (ref 65–99)
GLUCOSE-CAPILLARY: 148 mg/dL — AB (ref 65–99)

## 2015-02-11 LAB — CBC
HCT: 29.5 % — ABNORMAL LOW (ref 39.0–52.0)
Hemoglobin: 9.6 g/dL — ABNORMAL LOW (ref 13.0–17.0)
MCH: 27.4 pg (ref 26.0–34.0)
MCHC: 32.5 g/dL (ref 30.0–36.0)
MCV: 84.3 fL (ref 78.0–100.0)
Platelets: 312 10*3/uL (ref 150–400)
RBC: 3.5 MIL/uL — ABNORMAL LOW (ref 4.22–5.81)
RDW: 14.3 % (ref 11.5–15.5)
WBC: 9.2 10*3/uL (ref 4.0–10.5)

## 2015-02-11 LAB — CARBOXYHEMOGLOBIN
CARBOXYHEMOGLOBIN: 1.8 % — AB (ref 0.5–1.5)
METHEMOGLOBIN: 0.8 % (ref 0.0–1.5)
O2 Saturation: 53.9 %
Total hemoglobin: 9.8 g/dL — ABNORMAL LOW (ref 13.5–18.0)

## 2015-02-11 MED ORDER — ENOXAPARIN SODIUM 120 MG/0.8ML ~~LOC~~ SOLN: 1.0000 mg/kg | Freq: Two times a day (BID) | SUBCUTANEOUS | Status: DC

## 2015-02-11 MED ORDER — ENOXAPARIN SODIUM 60 MG/0.6ML ~~LOC~~ SOLN
50.0000 mg | Freq: Two times a day (BID) | SUBCUTANEOUS | Status: DC
  Administered 2015-02-11 – 2015-02-14 (×6): 50 mg via SUBCUTANEOUS
  Filled 2015-02-11 (×6): qty 0.6

## 2015-02-11 NOTE — Progress Notes (Signed)
CSW spoke with pt regarding PT recommendation for SNF- pt is now agreeable to SNF placement.  [place] [place] provided to [place]  CSW will continue to follow  Merlyn LotJenna Holoman, [facility] Clinical Social Worker [phone]

## 2015-02-11 NOTE — Progress Notes (Signed)
Patient's external pacer had been set at a rate of 80  all day.  At change of shift during bedside reporting setting noted to be at 60.  [name] reported that Dr. Donata ClayVan Trigt had changed the rate.  Patient's HR now at 68 intrinsically.  No pacer spikes noted.  Oncoming RN and charge RN aware and will continue to monitor.

## 2015-02-11 NOTE — Progress Notes (Signed)
Pt c/o severe right ankle pain. Sts there is no way he can walk today. Sts he will walk tomorrow. We were unsuccessful in motivating him to walk.  Ethelda ChickKristan Jayion Schneck CES, ACSM 2:24 PM 02/11/2015

## 2015-02-11 NOTE — Clinical Social Work Placement (Signed)
   CLINICAL SOCIAL WORK PLACEMENT  NOTE  Date:  02/11/2015  Patient Details  Name: Bradley Benitez MRN: [id number] Date of Birth: 1950/06/25  Clinical Social Work is seeking post-discharge placement for this patient at the Skilled  Nursing Facility level of care (*CSW will initial, date and re-position this form in  chart as items are completed):  Yes   Patient/family provided with [facility] Clinical Social Work Department's list of facilities offering this level of care within the geographic area requested by the patient (or if unable, by the patient's family).  Yes   Patient/family informed of their freedom to choose among providers that offer the needed level of care, that participate in Medicare, Medicaid or managed care program needed by the patient, have an available bed and are willing to accept the patient.  Yes   Patient/family informed of [facility]'s ownership interest in [facility] and [facility], as well as of the fact that they are under no obligation to receive care at these facilities.  PASRR submitted to EDS on 02/11/15     PASRR number received on 02/11/15     Existing PASRR number confirmed on       FL2 transmitted to all facilities in geographic area requested by pt/family on 02/11/15     FL2 transmitted to all facilities within larger geographic area on       Patient informed that his/her managed care company has contracts with or will negotiate with certain facilities, including the following:            Patient/family informed of bed offers received.  Patient chooses bed at       Physician recommends and patient chooses bed at      Patient to be transferred to   on  .  Patient to be transferred to facility by       Patient family notified on   of transfer.  Name of family member notified:        PHYSICIAN Please sign FL2     Additional Comment:    _______________________________________________ Izora RibasHoloman, Sharalyn Lomba M, LCSW 02/11/2015,  3:32 PM

## 2015-02-11 NOTE — Progress Notes (Addendum)
[address]       [place] [address]             [phone]      10 Days Post-Op Procedure(s) (LRB): CORONARY ARTERY BYPASS GRAFTING (CABG)x 4 using left internal mammary artery and right greater saphenous leg vein using endoscope. (N/A) TRANSESOPHAGEAL ECHOCARDIOGRAM (TEE) (N/A)  CENTRIMAG VENTRICULAR ASSIST DEVICE, back up (N/A) Subjective: Primary c/o is right leg pain  Objective: Vital signs in last 24 hours: Temp:  [97.6 F (36.4 C)-98.5 F (36.9 C)] 97.6 F (36.4 C) (01/16 0601) Pulse Rate:  [79-88] 80 (01/16 0601) Cardiac Rhythm:  [-] Ventricular paced (01/16 0719) Resp:  [18-19] 18 (01/16 0601) BP: (99-112)/(57-71) 104/64 mmHg (01/16 0601) SpO2:  [96 %-99 %] 98 % (01/16 0734) Weight:  [241 lb 8 oz (109.544 kg)] 241 lb 8 oz (109.544 kg) (01/16 0601)  Hemodynamic parameters for last 24 hours:    Intake/Output from previous day: 01/15 0701 - 01/16 0700 In: 480 [P.O.:480] Out: 2850 [Urine:2850] Intake/Output this shift:    General appearance: alert, cooperative and no distress Heart: regular rate and rhythm Lungs: clear to auscultation bilaterally Abdomen: benign Extremities: + edema Wound: incis healing well, some echymosis right evh   Lab Results:  Recent Labs  02/10/15 0448 02/11/15 0431  WBC 9.0 9.2  HGB 9.6* 9.6*  HCT 29.0* 29.5*  PLT 295 312   BMET:  Recent Labs  02/10/15 0448 02/11/15 0431  NA 138 139  K 4.2 4.2  CL 104 104  CO2 27 29  GLUCOSE 117* 114*  BUN 16 14  CREATININE 1.17 1.28*  CALCIUM 8.4* 8.7*    PT/INR: No results for input(s): LABPROT, INR in the last 72 hours. ABG    Component Value Date/Time   PHART 7.449 02/06/2015 0647   HCO3 22.5 02/06/2015 0647   TCO2 23 02/08/2015 1714   ACIDBASEDEF 1.0 02/06/2015 0647   O2SAT 53.9 02/11/2015 0424   CBG (last 3)   Recent Labs  02/10/15 1628 02/10/15 2115 02/11/15 0705  GLUCAP 106* 141* 108*    Meds Scheduled Meds: . amiodarone  200 mg  Oral BID  . aspirin EC  325 mg Oral Daily  . atorvastatin  80 mg Oral q1800  . bisacodyl  10 mg Oral Daily   Or  . bisacodyl  10 mg Rectal Daily  . cephALEXin  500 mg Oral 3 times per day  . chlorhexidine   Topical Daily  . digoxin  0.125 mg Oral Daily  . docusate sodium  200 mg Oral Daily  . enoxaparin (LOVENOX) injection  50 mg Subcutaneous Q12H  . feeding supplement (GLUCERNA SHAKE)  237 mL Oral TID BM  . furosemide  40 mg Intravenous 3 times per day  . insulin aspart  0-15 Units Subcutaneous TID WC  . insulin detemir  20 Units Subcutaneous Daily  . levalbuterol  0.63 mg Nebulization TID  . losartan  25 mg Oral BID  . metolazone  2.5 mg Oral Daily  . pantoprazole  40 mg Oral Daily  . potassium chloride  40 mEq Oral BID  . sodium chloride  3 mL Intravenous Q12H  . spironolactone  25 mg Oral Daily  . sucralfate  1 g Oral TID WC & HS   Continuous Infusions: . sodium chloride 250 mL (02/05/15 0730)  . sodium chloride 20 mL/hr at 02/09/15 1154   PRN Meds:.Gerhardt's butt cream, ondansetron (ZOFRAN) IV, oxyCODONE, sodium chloride, traMADol  Xrays  Dg Chest 2 View  02/10/2015  CLINICAL DATA:  Acute respiratory failure with hypoxemia. Congestive heart failure. Poorly controlled diabetes with diabetic neuropathy. EXAM: CHEST  2 VIEW COMPARISON:  02/09/2015 FINDINGS: Right arm PICC line in right subclavian central venous catheter remain in appropriate position. Heart size is normal. Prior CABG again noted. Bibasilar atelectasis or infiltrates show no significant change. Probable tiny bilateral pleural effusions again noted. IMPRESSION: No significant change in bibasilar atelectasis or infiltrates and probable tiny bilateral pleural effusions. Electronically Signed   By: Myles RosenthalJohn  Stahl M.D.   On: 02/10/2015 08:57    Assessment/Plan: S/P Procedure(s) (LRB): CORONARY ARTERY BYPASS GRAFTING (CABG)x 4 using left internal mammary artery and right greater saphenous leg vein using endoscope.  (N/A) TRANSESOPHAGEAL ECHOCARDIOGRAM (TEE) (N/A)  CENTRIMAG VENTRICULAR ASSIST DEVICE, back up (N/A)  1 doing well with slow/steady overall progress 2 for venous duplex- on keflex also for possible infection 3 creat slightly higher- AHF team assisting with management/diuresis 4 good blood sugar control 5 paced rhythm 6 push rehab as able  LOS: 19 days    GOLD,WAYNE E 02/11/2015  Leg U/S neg for DVT Not walking today with rehab due to foot pain Will need SNF later - poss Fri or over weekend after meds are adjusted and Eliquis started AFTER EPWS removed- currently needs temp pacer due to bradycardia  patient examined and medical record reviewed,agree with above note. Kathlee Nationseter Van Trigt III 02/11/2015

## 2015-02-11 NOTE — Consult Note (Signed)
   [facility]   02/11/2015  Oliver PilaDavid M Fair March 15, 1950 [id number]   Came to visit patient to [facility] with [facility]. He endorses he plans on going to SNF for short term and is not sure if he will go home or stay with family after discharge from SNF. He states he is appreciative of the offer but [facility] after he knows where he is going to be after SNF. [facility] and contact information left at bedside for him or family to call in future if needed. Made inpatient RNCM aware.   Raiford NobleAtika Hall, MSN-Ed, RN,BSN [facility] Hospital Liaison [phone]

## 2015-02-11 NOTE — Progress Notes (Signed)
VASCULAR LAB PRELIMINARY  PRELIMINARY  PRELIMINARY  PRELIMINARY   Bilateral lower  Extremity venous Doppler has been completed. Bilateral:  No evidence of DVT, superficial thrombosis, or Baker's Cyst.    Jenetta Logesami Tayona Sarnowski, RVT, RDMS 02/11/2015, 12:24 PM

## 2015-02-11 NOTE — Progress Notes (Signed)
Patient ID: Bradley Benitez, male   DOB: 15-Aug-1950, 65 y.o.   MRN: [id number]   65 yo with history of DM and smoking but has not had medical care in decades presented with acute systolic CHF.  Echo showed EF 25-30% with apical thrombus and LHC showed 3VD.  Underwent CABG 1/6. On 1/10 had VF arrest with defib x 9.   Feels OK this am. Weight shows up 9 lbs despite diuresis of - 4 L x 24 hrs.  Co-ox 53.9%.  Rhythm stable. Has a constant soreness in his chest, not related to exertion (although admittedly limited in his exertion currently). Creatinine up slightly. Denies SOB. Not walking, unable to with pain in R leg. [place] pending.   Scheduled Meds: . amiodarone  200 mg Oral BID  . aspirin EC  325 mg Oral Daily  . atorvastatin  80 mg Oral q1800  . bisacodyl  10 mg Oral Daily   Or  . bisacodyl  10 mg Rectal Daily  . cephALEXin  500 mg Oral 3 times per day  . chlorhexidine   Topical Daily  . digoxin  0.125 mg Oral Daily  . docusate sodium  200 mg Oral Daily  . enoxaparin (LOVENOX) injection  50 mg Subcutaneous Q12H  . feeding supplement (GLUCERNA SHAKE)  237 mL Oral TID BM  . furosemide  40 mg Intravenous 3 times per day  . insulin aspart  0-15 Units Subcutaneous TID WC  . insulin detemir  20 Units Subcutaneous Daily  . levalbuterol  0.63 mg Nebulization TID  . losartan  25 mg Oral BID  . metolazone  2.5 mg Oral Daily  . pantoprazole  40 mg Oral Daily  . potassium chloride  40 mEq Oral BID  . sodium chloride  3 mL Intravenous Q12H  . spironolactone  25 mg Oral Daily  . sucralfate  1 g Oral TID WC & HS   Continuous Infusions: . sodium chloride 250 mL (02/05/15 0730)  . sodium chloride 20 mL/hr at 02/09/15 1154   PRN Meds:.Gerhardt's butt cream, ondansetron (ZOFRAN) IV, oxyCODONE, sodium chloride, traMADol    Filed Vitals:   02/10/15 2023 02/10/15 2139 02/11/15 0601 02/11/15 0734  BP: 99/57  104/64   Pulse: 79  80   Temp: 97.8 F (36.6 C)  97.6 F (36.4 C)   TempSrc: Oral  Oral     Resp: 18  18   Height:      Weight:   241 lb 8 oz (109.544 kg)   SpO2: 99% 99% 98% 98%    Intake/Output Summary (Last 24 hours) at 02/11/15 1049 Last data filed at 02/11/15 0031  Gross per 24 hour  Intake    360 ml  Output   1050 ml  Net   -690 ml    LABS: Basic Metabolic Panel:  Recent Labs  53/66/44 0448 02/11/15 0431  NA 138 139  K 4.2 4.2  CL 104 104  CO2 27 29  GLUCOSE 117* 114*  BUN 16 14  CREATININE 1.17 1.28*  CALCIUM 8.4* 8.7*   Liver Function Tests: No results for input(s): AST, ALT, ALKPHOS, BILITOT, PROT, ALBUMIN in the last 72 hours. No results for input(s): LIPASE, AMYLASE in the last 72 hours. CBC:  Recent Labs  02/10/15 0448 02/11/15 0431  WBC 9.0 9.2  HGB 9.6* 9.6*  HCT 29.0* 29.5*  MCV 84.5 84.3  PLT 295 312   Cardiac Enzymes: No results for input(s): CKTOTAL, CKMB, CKMBINDEX, TROPONINI in the last 72  hours. BNP: Invalid input(s): POCBNP D-Dimer: No results for input(s): DDIMER in the last 72 hours. Hemoglobin A1C: No results for input(s): HGBA1C in the last 72 hours. Fasting Lipid Panel: No results for input(s): CHOL, HDL, LDLCALC, TRIG, CHOLHDL, LDLDIRECT in the last 72 hours. Thyroid Function Tests: No results for input(s): TSH, T4TOTAL, T3FREE, THYROIDAB in the last 72 hours.  Invalid input(s): FREET3 Anemia Panel: No results for input(s): VITAMINB12, FOLATE, FERRITIN, TIBC, IRON, RETICCTPCT in the last 72 hours.  RADIOLOGY: Dg Chest 2 View  02/10/2015  CLINICAL DATA:  Acute respiratory failure with hypoxemia. Congestive heart failure. Poorly controlled diabetes with diabetic neuropathy. EXAM: CHEST  2 VIEW COMPARISON:  02/09/2015 FINDINGS: Right arm PICC line in right subclavian central venous catheter remain in appropriate position. Heart size is normal. Prior CABG again noted. Bibasilar atelectasis or infiltrates show no significant change. Probable tiny bilateral pleural effusions again noted. IMPRESSION: No significant  change in bibasilar atelectasis or infiltrates and probable tiny bilateral pleural effusions. Electronically Signed   By: Myles Rosenthal M.D.   On: 02/10/2015 08:57   Dg Chest 2 View  02/05/2015  CLINICAL DATA:  Shortness of breath, status post CABG 4 days ago. EXAM: CHEST  2 VIEW COMPARISON:  PA and lateral chest x-ray of February 04, 2015 FINDINGS: The left lung is well-expanded. There is minimal basilar atelectasis medially. On the right a small amount of pleural fluid remains. The cardiac silhouette is mildly enlarged. The pulmonary vascularity is not engorged. There is no pneumothorax or pneumomediastinum. There are 7 intact sternal wires. The right-sided PICC line tip projects over the junction of the distal SVC with the right atrium. IMPRESSION: Further interval improvement in pulmonary interstitial edema. Persistent left basilar subsegmental atelectasis and persistent trace right pleural effusion. Electronically Signed   By: Braddock  [place] M.D.   On: 02/05/2015 07:53   Dg Chest 2 View  02/04/2015  CLINICAL DATA:  Acute respiratory failure, CHF, previous MI, former smoker. EXAM: CHEST  2 VIEW COMPARISON:  Portable chest x-ray of February 03, 2015 FINDINGS: The lungs are borderline hypoinflated. There is a trace of pleural fluid on the right. Right basilar atelectasis or pneumonia has improved. There remains retrocardiac subsegmental atelectasis but it has improved as well. The cardiac silhouette remains enlarged. The pulmonary vascularity is less engorged. There are post CABG changes. The right-sided PICC line tip projects over the distal third of the SVC. The right internal jugular Cordis sheath has been removed. IMPRESSION: Mild interval improvement in bibasilar atelectasis or pneumonia. Trace right pleural effusion remains. Stable mild cardiomegaly with decreased pulmonary vascular congestion. Electronically Signed   By: Daymien  [place] M.D.   On: 02/04/2015 08:04   Dg Chest 2 View  01/26/2015  CLINICAL  DATA:  Shortness of breath this AM; h/o diabetes; former smoker EXAM: CHEST  2 VIEW COMPARISON:  01/25/2015 FINDINGS: Heart size is normal. There is prominence of interstitial markings, increased. Perihilar peribronchial thickening and noted. There are no focal consolidations. There are small bilateral pleural effusions. IMPRESSION: 1. Increased bronchitic changes. 2. Increased bilateral pleural effusions. Electronically Signed   By: Norva Pavlov M.D.   On: 01/26/2015 10:34   Dg Chest Port 1 View  02/09/2015  CLINICAL DATA:  Short of breath EXAM: PORTABLE CHEST 1 VIEW COMPARISON:  02/07/2015 FINDINGS: Normal heart size. Lungs under aerated with bibasilar hypoaeration. Small pleural effusions may be present. No pneumothorax. Right subclavian central venous catheter tip at the cavoatrial junction. IMPRESSION: Bibasilar atelectasis Small pleural effusions  may be present. Electronically Signed   By: Jolaine Click M.D.   On: 02/09/2015 10:32   Dg Chest Port 1 View  02/07/2015  CLINICAL DATA:  CABG EXAM: PORTABLE CHEST 1 VIEW COMPARISON:  02/06/2015 FINDINGS: Right PICC and right subclavian central venous catheter are stable. Normal heart size. Low volumes. Bibasilar atelectasis. Vascular congestion has resolved. Small pleural effusions are suspected. IMPRESSION: Resolved vascular congestion. Bibasilar atelectasis. Small pleural effusions persist. Electronically Signed   By: Jolaine Click M.D.   On: 02/07/2015 07:36   Dg Chest Port 1 View  02/06/2015  CLINICAL DATA:  CABG. EXAM: PORTABLE CHEST 1 VIEW COMPARISON:  02/05/2015. FINDINGS: Right PICC line in stable position. Prior CABG. Cardiomegaly with pulmonary venous congestion and interstitial prominence, no change from prior exam. Findings consistent with stable changes of congestive heart failure. Small bilateral pleural effusions. No pneumothorax. IMPRESSION: 1. Right PICC line in stable position. 2. Prior CABG. Persistent changes of congestive heart failure  with mild pulmonary interstitial edema and small pleural effusions. Electronically Signed   By: Maisie Fus  Register   On: 02/06/2015 07:20   Dg Chest Port 1 View  02/05/2015  CLINICAL DATA:  Code. EXAM: PORTABLE CHEST 1 VIEW COMPARISON:  Earlier today at 627 hours.  One day prior. FINDINGS: 741 hours. Extensive artifact degradation due to overlying support apparatus, external pacer. A right sided PICC line terminates at the low SVC. Patient rotated left. Prior median sternotomy. Cardiomegaly accentuated by AP portable technique. Left costophrenic angle excluded. Probable layering right pleural effusion. No pneumothorax. Low lung volumes, accentuating interstitial edema. Developing right base airspace disease. IMPRESSION: Developing congestive heart failure. Probable small right pleural effusion with adjacent right base atelectasis. Extensive artifact degradation. Electronically Signed   By: Jeronimo Greaves M.D.   On: 02/05/2015 07:50   Dg Chest Port 1 View  02/03/2015  CLINICAL DATA:  Atelectasis EXAM: PORTABLE CHEST 1 VIEW COMPARISON:  Chest x-rays dated 02/02/2015 and 02/01/2015. FINDINGS: Left-sided chest tube is no longer seen. Swan-Ganz catheter is also no longer seen. Mediastinal drains have also been removed. Right IJ central line access site remains with tip projected over the upper SVC. Right-sided PICC line appears well positioned with tip projected over the lower SVC. Cardiomediastinal silhouette is stable in size and configuration. Cardiomegaly is stable. Median sternotomy wires are intact and stable in alignment, presumably for CABG. There is mild central pulmonary vascular congestion. Ill-defined bibasilar airspace opacities are not not significantly changed, presumably atelectasis and/or small effusions. No new lung findings. No pneumothorax seen. IMPRESSION: 1. Multiple tubes and lines removed, as above. 2. Persistent mild central pulmonary vascular congestion without evidence of overt pulmonary  edema. 3. Persistent ill-defined bibasilar airspace opacities, presumably atelectasis and/or small effusions. 4. No new lung findings. 5. Stable cardiomegaly. Electronically Signed   By: Bary Richard M.D.   On: 02/03/2015 08:02   Dg Chest Port 1 View  02/02/2015  CLINICAL DATA:  65 year old male status post 4 vessel CABG EXAM: PORTABLE CHEST 1 VIEW COMPARISON:  Prior chest x-ray 02/01/2015 FINDINGS: The patient has been extubated and the nasogastric tube removed. A right IJ vascular sheath convey is a Swan-Ganz catheter into the heart. The tip of the Swan overlies the proximal right lower lobe pulmonary artery. A right upper extremity PICC is again noted in good position with the tip overlying the upper right atrium. Mediastinal and left thoracic drains are present via a subxiphoid approach. Patient is status post median sternotomy with evidence of multivessel CABG. Stable  cardiomegaly. Mediastinal contours are grossly unchanged given lower inspiratory volumes. Lower inspiratory volumes with increased bibasilar opacities favored to reflect atelectasis. Pulmonary vascular congestion without overt edema. No evidence of pneumothorax or large effusion. No acute osseous abnormality. IMPRESSION: 1. Interval extubation and removal of nasogastric tube. 2. Lower inspiratory volumes with increasing bibasilar atelectasis. 3. Pulmonary vascular congestion without overt edema. 4. Other support apparatus in stable and satisfactory position as above. Electronically Signed   By: Malachy Moan M.D.   On: 02/02/2015 08:54   Dg Chest Port 1 View  02/01/2015  CLINICAL DATA:  Patient status post CABG procedure. EXAM: PORTABLE CHEST 1 VIEW COMPARISON:  Chest radiograph 01/26/2015. FINDINGS: ET tube terminates in the mid trachea. Enteric tube courses inferior to the diaphragm. Right IJ approach pulmonary arterial catheter is present with tip projecting over the expected location of the right main pulmonary artery. Multiple  mediastinal drains are present. Right upper extremity PICC line is present with tip projecting at the superior cavoatrial junction. Left chest tube in place. Stable cardiac and mediastinal contours status post median sternotomy and CABG procedure. No large area of pulmonary consolidation. No definite pleural effusion or pneumothorax. No aggressive or acute appearing osseous lesions. IMPRESSION: Support apparatus as above. Electronically Signed   By: Annia Belt M.D.   On: 02/01/2015 16:04   Dg Chest Port 1 View  01/25/2015  CLINICAL DATA:  Acute respiratory failure with hypoxemia.  Fever. EXAM: PORTABLE CHEST 1 VIEW COMPARISON:  01/23/2015. FINDINGS: Grossly stable enlarged cardiac silhouette. Prominent pulmonary vasculature and interstitial markings with improvement. Interval small amount of linear density in the right mid lung zone. Minimal right pleural fluid. Central peribronchial thickening. Unremarkable bones. IMPRESSION: 1. Improving changes of congestive heart failure. 2. Small amount of linear atelectasis in the right mid lung zone. 3. Moderate bronchitic changes. Electronically Signed   By: Beckie Salts M.D.   On: 01/25/2015 16:34   Dg Chest Port 1 View  01/23/2015  CLINICAL DATA:  Congestive heart failure EXAM: PORTABLE CHEST 1 VIEW COMPARISON:  None. FINDINGS: There is generalized interstitial edema with patchy alveolar edema in the bases. Heart is enlarged with pulmonary venous hypertension. No adenopathy. IMPRESSION: Evidence of congestive heart failure with edema most pronounced in the lung bases. Electronically Signed   By: Bretta Bang III M.D.   On: 01/23/2015 09:34   Mr Card Morphology Wo/w Cm  01/29/2015  CLINICAL DATA:  CAD, assess for viability EXAM: CARDIAC MRI TECHNIQUE: The patient was scanned on a 1.5 Tesla GE magnet. A dedicated cardiac coil was used. Functional imaging was done using Fiesta sequences. 2,3, and 4 chamber views were done to assess for RWMA's. Modified  Simpson's rule using a short axis stack was used to calculate an ejection fraction on a dedicated work [employer]. The patient received 34 cc of Multihance. After 10 minutes inversion recovery sequences were used to assess for infiltration and scar tissue. CONTRAST:  34 cc Multihance FINDINGS: There were bilateral small to moderate pleural effusions. There was a trivial pericardial effusion. Normal left ventricular size. EF 26% with mid anteroseptal and mid anterior akinesis. The mid anterolateral wall was hypokinetic. The apical septal, inferior, and anterior segments were akinetic and the apical lateral segment was hypokinetic. The true apex was akinetic. A small apical thrombus was noted. The atria were normal in size. The right ventricle was normal in size and systolic function. The aortic valve was trileaflet with no stenosis or significant regurgitation. There did not  appear to be significant mitral regurgitation. On delayed enhancement imaging, there was subendocardial 76-99% wall thickness late gadolinium enhancement (LGE) in the mid anteroseptal and mid anterior wall segments, in the apical septal/anterior/inferior wall segments, and in the true apex. Basal segments without LGE, anterolateral wall without significant LGE, and mid inferior wall without LGE. MEASUREMENTS: MEASUREMENTS LV EDV 212 mL LV SV 54 mL LV EF 26% IMPRESSION: 1. Severe LV systolic dysfunction, EF 26%. Wall motion abnormalities as above. 2.  Normal RV size and systolic function . 3. LGE pattern suggestive of infarction in LAD territory. The mid anterior/anteroseptal and apical anterior/septal/inferior wall segments and the true apex do not appear viable (probably not likely to improve function with revascularization). The remainder of the left ventricle appears viable. 4.  There is a small LV apical thrombus. Dalton Mclean Electronically Signed   By: [name] M.D.   On: 01/29/2015 09:10    PHYSICAL  EXAM General: NAD . In bed Neck: JVP 10-11cm, no thyromegaly or thyroid nodule.  Lungs: Decreased bases bilaterally.  CV: Nondisplaced PMI.  Heart regular S1/S2, no S3/S4, no murmur. Erythema around SVG harvest site on L. R and LLE 2+ edema. R>L with edema and tenderness, difficult exam with tenderness.  No carotid bruit.  Normal pedal pulses.  Abdomen: Soft, NT, ND, no HSM. No bruits or masses. +BS  Neurologic: Alert and oriented x 3.  Psych: Normal affect. Extremities: No clubbing or cyanosis. Lesions on toes noted.   TELEMETRY: Reviewed personally, v-paced 80, no VT  ASSESSMENT AND PLAN: 65 yo with history of DM and smoking but has not had medical care in decades presented with acute systolic CHF.  Echo showed EF 25-30% with apical thrombus and LHC showed 3VD. 1. Ventricular fibrillation arrest:  -suspect scar-mediatd - No further VT on tele. On po amio - K stable and > 4.0  2. Acute systolic CHF: EF 16% with regional WMAs, ischemic cardiomyopathy.  Now s/p CABG x 4.   - Milrinone stopped 02/09/15. Co-ox remains marginal at 53.9 but he feels ok. Creatnine trending up slightly. - Losartan started 02/09/15. No b-blocker yet - Volume status improving but remains elevated. Weights innacurate (bed with inability to stand 2/2 R leg pain) - Continue lasix 40 mg IV every 8 hrs and replete K. Add daily metolazone.  - Continue spironolactone 25 mg daily.  3. CAD: Diffuse 3 vessel disease, see cath report above. MRI reviewed: LAD territory did not appear to have significant viability.  Remainder of LV myocardium did appear viable.  He had severe disease in LCx and RCA territory that may benefit from revascularization but suspect LAD territory will not recover.   - s/p CABG x 4 on 1/6.  Suspect ventricular fibrillation arrest 1/10 was scar-mediated as opposed to occlusion of graft, etc.  - Continue ASA, atorvastatin 80 daily.  4. LV mural thrombus: Seen on 1/10 echo, mobile thrombus.  Now on subQ  lovenox. - Would likely need Eliquis prior to discharge.  Given mental illness, think coumadin may be difficult for him to manage.   5. Diabetes:  -  Continue insulin.  6. Foot lesions:  - WOC consult appreciated.  - Continue dry dressing per WOC 7. Diabetic neuropathy:  - Continue gabapentin 300 mg bid.  8. R foot/calf pain and increased swelling compared to L side - VAS [place] pending to r/o DVT. 9. Deconditioning:  - Continue to mobilize as able.  Graciella Freer PA-C 02/11/2015 10:49 AM   Advanced  Heart Failure Team Pager [phone] (M-F; 7a - 4p)  Please contact [facility] for night-coverage after hours (4p -7a ) and weekends on [email or web address]  Patient seen with PA, agree with the above note.  Main complaint is right calf pain, unable to bear weight.  Venous US today showed no DVT. Possible infection, he is now on cephalexin.   Still some volume overload on exam, continue Lasix 40 mg IV bid with metolazone today, possible transition to po tomorrow.   Creatinine stable after addition of losartan.  Keep dose the same today.   Still has pacing wires in.   LV thrombus, on Lovenox now, transition to DOAC when he is near discharge and wires are out.   Co-ox borderline but acceptable for now.  Continue digoxin.   Marca AnconaDalton McLean 02/11/2015 12:54 PM

## 2015-02-11 NOTE — Care Management Important Message (Signed)
Important Message  Patient Details  Name: Bradley Benitez MRN: [id number] Date of Birth: 27-May-1950   Medicare Important Message Given:  Yes    Bernadette HoitShoffner, Ceejay Kegley Coleman 02/11/2015, 12:37 PM

## 2015-02-11 NOTE — [place] FL2 (Signed)
[place] MEDICAID FL2 LEVEL OF CARE SCREENING TOOL     IDENTIFICATION  Patient Name: QUENTON RECENDEZ Birthdate: 06-25-1950 Sex: male Admission Date (Current Location): 01/23/2015  [facility] and [place] Number:  Producer, television/film/video and Address:  [facility], [address], [place], [place] [address]      Provider Number: [id number]  Attending Physician Name and Address:  [name], MD  Relative Name and Phone Number:       Current Level of Care: Hospital Recommended Level of Care: Skilled Nursing Facility Prior Approval Number:    Date Approved/Denied:   PASRR Number: [id number]  Discharge Plan: SNF    Current Diagnoses: Patient Active Problem List   Diagnosis Date Noted  . 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 Acute respiratory failure with hypoxemia (HCC) 01/23/2015  . 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 Poorly controlled type 2 diabetes mellitus with peripheral neuropathy (HCC) 01/23/2015  . 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 Acute systolic congestive heart failure, NYHA class 4 (HCC) 01/23/2015  . NSTEMI (non-ST elevated myocardial infarction) (HCC) 01/23/2015  . Mural thrombus of cardiac apex (HCC) 01/23/2015  . Dyslipidemia 01/23/2015    Orientation RESPIRATION BLADDER Height & Weight    Self, Time, Situation, Place  Normal Continent 6\' 1"  (185.4 cm) 241 lbs.  BEHAVIORAL SYMPTOMS/MOOD NEUROLOGICAL BOWEL NUTRITION STATUS      Continent Diet (Vegetarian)  AMBULATORY STATUS COMMUNICATION OF NEEDS Skin   Limited Assist Verbally Surgical wounds                       Personal Care Assistance Level of Assistance  Bathing, Dressing Bathing Assistance: Limited assistance   Dressing Assistance: Limited assistance     Functional Limitations Info             SPECIAL CARE FACTORS FREQUENCY  PT (By licensed PT)     PT  Frequency: 5/wk              Contractures      Additional Factors Info  Code Status, Allergies, Insulin Sliding Scale Code Status Info: FULL Allergies Info: codeine   Insulin Sliding Scale Info: 3/day       Current Medications (02/11/2015):  This is the current hospital active medication list Current Facility-Administered Medications  Medication Dose Route Frequency Provider Last Rate Last Dose  . 0.9 %  sodium chloride infusion  250 mL Intravenous Continuous Wayne E Gold, PA-C 50 mL/hr at 02/05/15 0730 250 mL at 02/05/15 0730  . 0.9 %  sodium chloride infusion   Intravenous Continuous [name], MD 20 mL/hr at 02/09/15 1154    . amiodarone (PACERONE) tablet 200 mg  200 mg Oral BID [name], MD   200 mg at 02/10/15 2235  . aspirin EC tablet 325 mg  325 mg Oral Daily Rowe Clack, PA-C   325 mg at 02/10/15 1012  . atorvastatin (LIPITOR) tablet 80 mg  80 mg Oral q1800 Ellsworth Lennox, PA   80 mg at 02/10/15 1728  . bisacodyl (DULCOLAX) EC tablet 10 mg  10 mg Oral Daily Rowe Clack, PA-C   10 mg at 02/10/15 1010   Or  . bisacodyl (DULCOLAX) suppository 10 mg  10 mg Rectal Daily Wayne E Gold, PA-C      . cephALEXin (KEFLEX) capsule 500 mg  500 mg Oral 3  times per day Kerin PernaPeter Van Trigt, MD   500 mg at 02/11/15 0750  . chlorhexidine (HIBICLENS) 4 % liquid   Topical Daily Kerin PernaPeter Van Trigt, MD      . digoxin Margit Banda(LANOXIN) tablet 0.125 mg  0.125 mg Oral Daily Laurey Moralealton S McLean, MD   0.125 mg at 02/10/15 1009  . docusate sodium (COLACE) capsule 200 mg  200 mg Oral Daily Wayne E Gold, PA-C   200 mg at 02/10/15 1010  . enoxaparin (LOVENOX) injection 50 mg  50 mg Subcutaneous Q12H Kerin PernaPeter Van Trigt, MD   50 mg at 02/11/15 0750  . feeding supplement (GLUCERNA SHAKE) (GLUCERNA SHAKE) liquid 237 mL  237 mL Oral TID BM Kerin PernaPeter Van Trigt, MD   237 mL at 02/04/15 1128  . furosemide (LASIX) injection 40 mg  40 mg Intravenous 3 times per day Laurey Moralealton S McLean, MD   40 mg at 02/11/15 0750  .  Gerhardt's butt cream   Topical PRN Kerin PernaPeter Van Trigt, MD      . insulin aspart (novoLOG) injection 0-15 Units  0-15 Units Subcutaneous TID WC Purcell Nailslarence H Owen, MD   2 Units at 02/10/15 1214  . insulin detemir (LEVEMIR) injection 20 Units  20 Units Subcutaneous Daily Purcell Nailslarence H Owen, MD   20 Units at 02/10/15 1014  . levalbuterol (XOPENEX) nebulizer solution 0.63 mg  0.63 mg Nebulization TID Kerin PernaPeter Van Trigt, MD   0.63 mg at 02/11/15 0734  . losartan (COZAAR) tablet 25 mg  25 mg Oral BID Dolores Pattyaniel R Bensimhon, MD   25 mg at 02/10/15 2235  . metolazone (ZAROXOLYN) tablet 2.5 mg  2.5 mg Oral Daily Dolores Pattyaniel R Bensimhon, MD   2.5 mg at 02/10/15 1008  . ondansetron (ZOFRAN) injection 4 mg  4 mg Intravenous Q6H PRN Wayne E Gold, PA-C   4 mg at 02/07/15 0930  . oxyCODONE (Oxy IR/ROXICODONE) immediate release tablet 5-10 mg  5-10 mg Oral Q3H PRN Rowe ClackWayne E Gold, PA-C   5 mg at 02/06/15 1229  . pantoprazole (PROTONIX) EC tablet 40 mg  40 mg Oral Daily Wayne E Gold, PA-C   40 mg at 02/10/15 1012  . potassium chloride SA (K-DUR,KLOR-CON) CR tablet 40 mEq  40 mEq Oral BID Sherald HessAmy D Clegg, NP   40 mEq at 02/10/15 2235  . sodium chloride 0.9 % injection 10-40 mL  10-40 mL Intracatheter PRN Kerin PernaPeter Van Trigt, MD   10 mL at 02/11/15 0431  . sodium chloride 0.9 % injection 3 mL  3 mL Intravenous Q12H Wayne E Gold, PA-C   3 mL at 02/10/15 2200  . spironolactone (ALDACTONE) tablet 25 mg  25 mg Oral Daily Laurey Moralealton S McLean, MD   25 mg at 02/10/15 1010  . sucralfate (CARAFATE) tablet 1 g  1 g Oral TID WC & HS Laurey Moralealton S McLean, MD   1 g at 02/10/15 2235  . traMADol (ULTRAM) tablet 50-100 mg  50-100 mg Oral Q4H PRN Rowe ClackWayne E Gold, PA-C   50 mg at 02/11/15 [id number]     Discharge Medications: Please see discharge summary for a list of discharge medications.  Relevant Imaging Results:  Relevant Lab Results:   Additional Information SS#: [id number]  Izora RibasHoloman, Jenna M, LCSW    patient examined and medical record reviewed,agree with above  note. Kathlee Nationseter Van Trigt III 02/11/2015

## 2015-02-12 LAB — BASIC METABOLIC PANEL
ANION GAP: 11 (ref 5–15)
BUN: 16 mg/dL (ref 6–20)
CALCIUM: 8.8 mg/dL — AB (ref 8.9–10.3)
CO2: 29 mmol/L (ref 22–32)
CREATININE: 1.36 mg/dL — AB (ref 0.61–1.24)
Chloride: 95 mmol/L — ABNORMAL LOW (ref 101–111)
GFR, EST NON AFRICAN AMERICAN: 53 mL/min — AB (ref >60)
Glucose, Bld: 137 mg/dL — ABNORMAL HIGH (ref 65–99)
Potassium: 4.5 mmol/L (ref 3.5–5.1)
SODIUM: 135 mmol/L (ref 135–145)

## 2015-02-12 LAB — GLUCOSE, CAPILLARY
GLUCOSE-CAPILLARY: 133 mg/dL — AB (ref 65–99)
GLUCOSE-CAPILLARY: 169 mg/dL — AB (ref 65–99)
GLUCOSE-CAPILLARY: 130 mg/dL — AB (ref 65–99)
Glucose-Capillary: 120 mg/dL — ABNORMAL HIGH (ref 65–99)

## 2015-02-12 LAB — CARBOXYHEMOGLOBIN
CARBOXYHEMOGLOBIN: 2.1 % — AB (ref 0.5–1.5)
METHEMOGLOBIN: 0.7 % (ref 0.0–1.5)
O2 Saturation: 55.1 %
Total hemoglobin: 10.2 g/dL — ABNORMAL LOW (ref 13.5–18.0)

## 2015-02-12 LAB — DIGOXIN LEVEL: Digoxin Level: 0.6 ng/mL — ABNORMAL LOW (ref 0.8–2.0)

## 2015-02-12 MED ORDER — LACTULOSE 10 GM/15ML PO SOLN
20.0000 g | Freq: Every day | ORAL | Status: DC | PRN
  Administered 2015-02-12: 20 g via ORAL
  Filled 2015-02-12: qty 30

## 2015-02-12 MED ORDER — ALUM & MAG HYDROXIDE-SIMETH 200-200-20 MG/5ML PO SUSP
30.0000 mL | ORAL | Status: DC | PRN
  Administered 2015-02-12: 30 mL via ORAL
  Filled 2015-02-12: qty 30

## 2015-02-12 MED ORDER — POTASSIUM CHLORIDE CRYS ER 20 MEQ PO TBCR
20.0000 meq | EXTENDED_RELEASE_TABLET | Freq: Every day | ORAL | Status: DC
  Administered 2015-02-13 – 2015-02-15 (×3): 20 meq via ORAL
  Filled 2015-02-12 (×3): qty 1

## 2015-02-12 MED ORDER — CEPHALEXIN 500 MG PO CAPS
500.0000 mg | ORAL_CAPSULE | Freq: Three times a day (TID) | ORAL | Status: DC
  Administered 2015-02-12 (×2): 500 mg via ORAL
  Filled 2015-02-12 (×3): qty 1

## 2015-02-12 MED ORDER — ACETAMINOPHEN 325 MG PO TABS
650.0000 mg | ORAL_TABLET | Freq: Four times a day (QID) | ORAL | Status: DC | PRN
  Administered 2015-02-13: 650 mg via ORAL
  Filled 2015-02-12 (×2): qty 2

## 2015-02-12 MED ORDER — FUROSEMIDE 40 MG PO TABS
40.0000 mg | ORAL_TABLET | Freq: Every day | ORAL | Status: DC
  Administered 2015-02-13: 40 mg via ORAL
  Filled 2015-02-12: qty 1

## 2015-02-12 NOTE — Progress Notes (Signed)
Utilization review completed.  

## 2015-02-12 NOTE — Progress Notes (Addendum)
[address]       [place] [address]             [phone]      11 Days Post-Op Procedure(s) (LRB): CORONARY ARTERY BYPASS GRAFTING (CABG)x 4 using left internal mammary artery and right greater saphenous leg vein using endoscope. (N/A) TRANSESOPHAGEAL ECHOCARDIOGRAM (TEE) (N/A)  CENTRIMAG VENTRICULAR ASSIST DEVICE, back up (N/A)   Subjective:  Bradley Benitez continues to complain of various things.  He complains of too much medications.  He complains of food and not getting adequate protein.  He complains of pain his foot and is refusing to walk.    Objective: Vital signs in last 24 hours: Temp:  [97.7 F (36.5 C)-98 F (36.7 C)] 97.7 F (36.5 C) (01/17 0610) Pulse Rate:  [61-80] 61 (01/17 0610) Cardiac Rhythm:  [-] Ventricular paced (01/16 2000) Resp:  [16-18] 18 (01/17 0610) BP: (102-117)/(58-72) 117/70 mmHg (01/17 0610) SpO2:  [94 %-98 %] 94 % (01/17 0610)  Intake/Output from previous day: 01/16 0701 - 01/17 0700 In: 490 [P.O.:480; I.V.:10] Out: 2425 [Urine:2425]  General appearance: alert, cooperative and no distress Heart: regular rate and rhythm Lungs: clear to auscultation bilaterally Abdomen: soft, non-tender; bowel sounds normal; no masses,  no organomegaly Extremities: edema trace, ecchymosis of RLE Wound: clean and dry  Lab Results:  Recent Labs  02/10/15 0448 02/11/15 0431  WBC 9.0 9.2  HGB 9.6* 9.6*  HCT 29.0* 29.5*  PLT 295 312   BMET:  Recent Labs  02/11/15 0431 02/12/15 0440  NA 139 135  K 4.2 4.5  CL 104 95*  CO2 29 29  GLUCOSE 114* 137*  BUN 14 16  CREATININE 1.28* 1.36*  CALCIUM 8.7* 8.8*    PT/INR: No results for input(s): LABPROT, INR in the last 72 hours. ABG    Component Value Date/Time   PHART 7.449 02/06/2015 0647   HCO3 22.5 02/06/2015 0647   TCO2 23 02/08/2015 1714   ACIDBASEDEF 1.0 02/06/2015 0647   O2SAT 55.1 02/12/2015 0450   CBG (last 3)   Recent Labs  02/11/15 1317 02/11/15 1618  02/11/15 2040  GLUCAP 119* 101* 148*    Assessment/Plan: S/P Procedure(s) (LRB): CORONARY ARTERY BYPASS GRAFTING (CABG)x 4 using left internal mammary artery and right greater saphenous leg vein using endoscope. (N/A) TRANSESOPHAGEAL ECHOCARDIOGRAM (TEE) (N/A)  CENTRIMAG VENTRICULAR ASSIST DEVICE, back up (N/A)  1. CV- remains bradycardic, patient pacing at 60- AHF monitoring- remains on Amiodarone, Digoxin, Cozaar 2. Pulm- no acute issues, off oxygen, continue IS 3. Renal- creatinine up to 1.36, K is 4.5, remains hypervolemic, LE edema improving- continue diuretics 4. ID- LLE ecchymosis improving, no DVT remains on Keflex for possible infection.... Patient is complaining of GI upset with medication encouraged to try with food 5. Deconditioning- patient is not ambulating, PT consult is pending- I told patient that refusing to walk is unacceptable.  I told him we will obtain a PT consult today, and that he must try to walk three times per day.  I explained the risk of pneumonia, DVT, and worsening of bed sores with refusing to ambulate 6. DM-get diabetes education consult, sugars currently controlled 7. DIspo- patient stable, continues to have multiple complaints that were addressed, awaiting PT consult and patient needs to get out of bed   LOS: 20 days    Bradley Benitez, Bradley Benitez 02/12/2015  Will roll and tape EPWs If pacing not needed then will stop lovenox and DC EPWs, start Eliquis 6  hours after wires pulled He will be on Eliqusi at SNF for LV thrombus  patient examined and medical record reviewed,agree with above note. Kathlee Nations Trigt III 02/12/2015

## 2015-02-12 NOTE — Progress Notes (Signed)
Physical Therapy Treatment Patient Details Name: Bradley Benitez MRN: [id number] DOB: 02-29-1950 Today's Date: 02/12/2015    History of Present Illness 65 y/o male with diabetes but currently does not go to the doctor or take any medications [facility] department on 01/23/2015 complaining of shortness of breath. In the emergency department he was noted to be profoundly hypoxemic, requiring BiPAP for respiratory assistance, he had an elevated lactic acid. He was given antibiotics for presumed sepsis.Tentative plan for CABG 02/01/2015.  S/p CABG 1/7, 1/10  vfib episode with 8-9 shock to get pt into rhthym.    PT Comments    Progressing slowly,  Self-limiting, but doing more with encouragement.  So far can only get pt to do 1 activity, so stessing ambulation to get the most "bang for the buck".   Follow Up Recommendations  SNF     Equipment Recommendations  Other (comment)    Recommendations for Other Services       Precautions / Restrictions Precautions Precautions: Fall    Mobility  Bed Mobility               General bed mobility comments: OOB already  Transfers Overall transfer level: Needs assistance Equipment used: Rolling walker (2 wheeled) Transfers: Sit to/from Stand Sit to Stand: Min assist         General transfer comment: cues for hand placement based on sternal precautions.  Ambulation/Gait Ambulation/Gait assistance: Min guard;Min assist Ambulation Distance (Feet): 150 Feet Assistive device: Rolling walker (2 wheeled)   Gait velocity: slower Gait velocity interpretation: Below normal speed for age/gender General Gait Details: a little unsteady with RW today, cues for posture, and to push himelf further.   Stairs            Wheelchair Mobility    Modified Rankin (Stroke Patients Only)       Balance Overall balance assessment: Needs assistance   Sitting balance-Leahy Scale: Fair       Standing  balance-Leahy Scale: Poor Standing balance comment: still reliant on the RW and mildly unsteady with the RW                    Cognition Arousal/Alertness: Awake/alert Behavior During Therapy: [facility] for tasks assessed/performed;Anxious Overall Cognitive Status: Within Functional Limits for tasks assessed                      Exercises      General Comments        Pertinent Vitals/Pain Pain Assessment: Faces Faces Pain Scale: Hurts little more Pain Location: R LE Pain Descriptors / Indicators: Aching;Discomfort;Grimacing Pain Intervention(s): Monitored during session    Home Living                      Prior Function            PT Goals (current goals can now be found in the care plan section) Acute Rehab PT Goals Patient Stated Goal: To get home, breathe better PT Goal Formulation: With patient Time For Goal Achievement: 02/13/15 Potential to Achieve Goals: Good Progress towards PT goals: Progressing toward goals    Frequency  Min 3X/week    PT Plan Current plan remains appropriate    Co-evaluation             End of Session   Activity Tolerance: Patient tolerated treatment well Patient left: in chair;with call bell/phone within reach;with family/visitor present  Time: 1320-1340 PT Time Calculation (min) (ACUTE ONLY): 20 min  Charges:  $Gait Training: 8-22 mins                    G Codes:      Bradley Benitez, Bradley Benitez 02/12/2015, 5:14 PM 02/12/2015  Bradley Benitez Bradley Benitez, PT [phone]  (pager)

## 2015-02-12 NOTE — Care Management Note (Signed)
Case Management Note  Patient Details  Name: Bradley Benitez MRN: [id number] Date of Birth: [birth year]  Subjective/Objective:                    Action/Plan:   Expected Discharge Date:                  Expected Discharge Plan:  Skilled Nursing Facility  In-House Referral:  Clinical Social Work  Discharge planning Services  CM Consult, Medication Assistance  Post Acute Care Choice:    Choice offered to:     DME Arranged:    DME Agency:     HH Arranged:    HH Agency:     Status of Service:  In process, will continue to follow  Medicare Important Message Given:  Yes Date Medicare IM Given:    Medicare IM give by:    Date Additional Medicare IM Given:    Additional Medicare Important Message give by:     If discussed at Long Length of Stay Meetings, dates discussed:    Additional Comments:  Darrold Span, RN 02/12/2015, 10:08 AM

## 2015-02-12 NOTE — Progress Notes (Signed)
Patient ID: Bradley Benitez, male   DOB: November 21, 1950, 65 y.o.   MRN: [id number]   65 yo with history of DM and smoking but has not had medical care in decades presented with acute systolic CHF.  Echo showed EF 25-30% with apical thrombus and LHC showed 3VD.  Underwent CABG 1/6. On 1/10 had VF arrest with defib x 9.   Out 2.5 L yesterday. No weight checked today. Co-ox 55.1%. Continues to argue with staff about his medication and food. Has so far refused to walk. Still sore in the chest. He thinks ABX are making it worse.    Scheduled Meds: . amiodarone  200 mg Oral BID  . aspirin EC  325 mg Oral Daily  . atorvastatin  80 mg Oral q1800  . bisacodyl  10 mg Oral Daily   Or  . bisacodyl  10 mg Rectal Daily  . cephALEXin  500 mg Oral TID WC  . chlorhexidine   Topical Daily  . digoxin  0.125 mg Oral Daily  . docusate sodium  200 mg Oral Daily  . enoxaparin (LOVENOX) injection  50 mg Subcutaneous Q12H  . feeding supplement (GLUCERNA SHAKE)  237 mL Oral TID BM  . furosemide  40 mg Intravenous 3 times per day  . insulin aspart  0-15 Units Subcutaneous TID WC  . insulin detemir  20 Units Subcutaneous Daily  . levalbuterol  0.63 mg Nebulization TID  . losartan  25 mg Oral BID  . metolazone  2.5 mg Oral Daily  . pantoprazole  40 mg Oral Daily  . potassium chloride  40 mEq Oral BID  . sodium chloride  3 mL Intravenous Q12H  . spironolactone  25 mg Oral Daily  . sucralfate  1 g Oral TID WC & HS   Continuous Infusions: . sodium chloride 250 mL (02/05/15 0730)  . sodium chloride 20 mL/hr at 02/09/15 1154   PRN Meds:.Gerhardt's butt cream, ondansetron (ZOFRAN) IV, oxyCODONE, sodium chloride, traMADol    Filed Vitals:   02/11/15 1919 02/11/15 2011 02/12/15 0610 02/12/15 0847  BP:  106/58 117/70   Pulse: 78 69 61   Temp:  98 F (36.7 C) 97.7 F (36.5 C)   TempSrc:  Oral    Resp: 16 18 18    Height:      Weight:      SpO2: 97% 96% 94% 97%    Intake/Output Summary (Last 24 hours) at  02/12/15 1006 Last data filed at 02/12/15 0833  Gross per 24 hour  Intake    250 ml  Output   2400 ml  Net  -2150 ml    LABS: Basic Metabolic Panel:  Recent Labs  24/40/10 0431 02/12/15 0440  NA 139 135  K 4.2 4.5  CL 104 95*  CO2 29 29  GLUCOSE 114* 137*  BUN 14 16  CREATININE 1.28* 1.36*  CALCIUM 8.7* 8.8*   Liver Function Tests: No results for input(s): AST, ALT, ALKPHOS, BILITOT, PROT, ALBUMIN in the last 72 hours. No results for input(s): LIPASE, AMYLASE in the last 72 hours. CBC:  Recent Labs  02/10/15 0448 02/11/15 0431  WBC 9.0 9.2  HGB 9.6* 9.6*  HCT 29.0* 29.5*  MCV 84.5 84.3  PLT 295 312   Cardiac Enzymes: No results for input(s): CKTOTAL, CKMB, CKMBINDEX, TROPONINI in the last 72 hours. BNP: Invalid input(s): POCBNP D-Dimer: No results for input(s): DDIMER in the last 72 hours. Hemoglobin A1C: No results for input(s): HGBA1C in the last 72 hours.  Fasting Lipid Panel: No results for input(s): CHOL, HDL, LDLCALC, TRIG, CHOLHDL, LDLDIRECT in the last 72 hours. Thyroid Function Tests: No results for input(s): TSH, T4TOTAL, T3FREE, THYROIDAB in the last 72 hours.  Invalid input(s): FREET3 Anemia Panel: No results for input(s): VITAMINB12, FOLATE, FERRITIN, TIBC, IRON, RETICCTPCT in the last 72 hours.  RADIOLOGY: Dg Chest 2 View  02/10/2015  CLINICAL DATA:  Acute respiratory failure with hypoxemia. Congestive heart failure. Poorly controlled diabetes with diabetic neuropathy. EXAM: CHEST  2 VIEW COMPARISON:  02/09/2015 FINDINGS: Right arm PICC line in right subclavian central venous catheter remain in appropriate position. Heart size is normal. Prior CABG again noted. Bibasilar atelectasis or infiltrates show no significant change. Probable tiny bilateral pleural effusions again noted. IMPRESSION: No significant change in bibasilar atelectasis or infiltrates and probable tiny bilateral pleural effusions. Electronically Signed   By: Myles Rosenthal M.D.    On: 02/10/2015 08:57   Dg Chest 2 View  02/05/2015  CLINICAL DATA:  Shortness of breath, status post CABG 4 days ago. EXAM: CHEST  2 VIEW COMPARISON:  PA and lateral chest x-ray of February 04, 2015 FINDINGS: The left lung is well-expanded. There is minimal basilar atelectasis medially. On the right a small amount of pleural fluid remains. The cardiac silhouette is mildly enlarged. The pulmonary vascularity is not engorged. There is no pneumothorax or pneumomediastinum. There are 7 intact sternal wires. The right-sided PICC line tip projects over the junction of the distal SVC with the right atrium. IMPRESSION: Further interval improvement in pulmonary interstitial edema. Persistent left basilar subsegmental atelectasis and persistent trace right pleural effusion. Electronically Signed   By: Iasiah  [place] M.D.   On: 02/05/2015 07:53   Dg Chest 2 View  02/04/2015  CLINICAL DATA:  Acute respiratory failure, CHF, previous MI, former smoker. EXAM: CHEST  2 VIEW COMPARISON:  Portable chest x-ray of February 03, 2015 FINDINGS: The lungs are borderline hypoinflated. There is a trace of pleural fluid on the right. Right basilar atelectasis or pneumonia has improved. There remains retrocardiac subsegmental atelectasis but it has improved as well. The cardiac silhouette remains enlarged. The pulmonary vascularity is less engorged. There are post CABG changes. The right-sided PICC line tip projects over the distal third of the SVC. The right internal jugular Cordis sheath has been removed. IMPRESSION: Mild interval improvement in bibasilar atelectasis or pneumonia. Trace right pleural effusion remains. Stable mild cardiomegaly with decreased pulmonary vascular congestion. Electronically Signed   By: Jayin  [place] M.D.   On: 02/04/2015 08:04   Dg Chest 2 View  01/26/2015  CLINICAL DATA:  Shortness of breath this AM; h/o diabetes; former smoker EXAM: CHEST  2 VIEW COMPARISON:  01/25/2015 FINDINGS: Heart size is normal.  There is prominence of interstitial markings, increased. Perihilar peribronchial thickening and noted. There are no focal consolidations. There are small bilateral pleural effusions. IMPRESSION: 1. Increased bronchitic changes. 2. Increased bilateral pleural effusions. Electronically Signed   By: Norva Pavlov M.D.   On: 01/26/2015 10:34   Dg Chest Port 1 View  02/09/2015  CLINICAL DATA:  Short of breath EXAM: PORTABLE CHEST 1 VIEW COMPARISON:  02/07/2015 FINDINGS: Normal heart size. Lungs under aerated with bibasilar hypoaeration. Small pleural effusions may be present. No pneumothorax. Right subclavian central venous catheter tip at the cavoatrial junction. IMPRESSION: Bibasilar atelectasis Small pleural effusions may be present. Electronically Signed   By: Jolaine Click M.D.   On: 02/09/2015 10:32   Dg Chest Port 1 View  02/07/2015  CLINICAL  DATA:  CABG EXAM: PORTABLE CHEST 1 VIEW COMPARISON:  02/06/2015 FINDINGS: Right PICC and right subclavian central venous catheter are stable. Normal heart size. Low volumes. Bibasilar atelectasis. Vascular congestion has resolved. Small pleural effusions are suspected. IMPRESSION: Resolved vascular congestion. Bibasilar atelectasis. Small pleural effusions persist. Electronically Signed   By: Jolaine Click M.D.   On: 02/07/2015 07:36   Dg Chest Port 1 View  02/06/2015  CLINICAL DATA:  CABG. EXAM: PORTABLE CHEST 1 VIEW COMPARISON:  02/05/2015. FINDINGS: Right PICC line in stable position. Prior CABG. Cardiomegaly with pulmonary venous congestion and interstitial prominence, no change from prior exam. Findings consistent with stable changes of congestive heart failure. Small bilateral pleural effusions. No pneumothorax. IMPRESSION: 1. Right PICC line in stable position. 2. Prior CABG. Persistent changes of congestive heart failure with mild pulmonary interstitial edema and small pleural effusions. Electronically Signed   By: Maisie Fus  Register   On: 02/06/2015 07:20    Dg Chest Port 1 View  02/05/2015  CLINICAL DATA:  Code. EXAM: PORTABLE CHEST 1 VIEW COMPARISON:  Earlier today at 627 hours.  One day prior. FINDINGS: 741 hours. Extensive artifact degradation due to overlying support apparatus, external pacer. A right sided PICC line terminates at the low SVC. Patient rotated left. Prior median sternotomy. Cardiomegaly accentuated by AP portable technique. Left costophrenic angle excluded. Probable layering right pleural effusion. No pneumothorax. Low lung volumes, accentuating interstitial edema. Developing right base airspace disease. IMPRESSION: Developing congestive heart failure. Probable small right pleural effusion with adjacent right base atelectasis. Extensive artifact degradation. Electronically Signed   By: Jeronimo Greaves M.D.   On: 02/05/2015 07:50   Dg Chest Port 1 View  02/03/2015  CLINICAL DATA:  Atelectasis EXAM: PORTABLE CHEST 1 VIEW COMPARISON:  Chest x-rays dated 02/02/2015 and 02/01/2015. FINDINGS: Left-sided chest tube is no longer seen. Swan-Ganz catheter is also no longer seen. Mediastinal drains have also been removed. Right IJ central line access site remains with tip projected over the upper SVC. Right-sided PICC line appears well positioned with tip projected over the lower SVC. Cardiomediastinal silhouette is stable in size and configuration. Cardiomegaly is stable. Median sternotomy wires are intact and stable in alignment, presumably for CABG. There is mild central pulmonary vascular congestion. Ill-defined bibasilar airspace opacities are not not significantly changed, presumably atelectasis and/or small effusions. No new lung findings. No pneumothorax seen. IMPRESSION: 1. Multiple tubes and lines removed, as above. 2. Persistent mild central pulmonary vascular congestion without evidence of overt pulmonary edema. 3. Persistent ill-defined bibasilar airspace opacities, presumably atelectasis and/or small effusions. 4. No new lung findings. 5.  Stable cardiomegaly. Electronically Signed   By: Bary Richard M.D.   On: 02/03/2015 08:02   Dg Chest Port 1 View  02/02/2015  CLINICAL DATA:  65 year old male status post 4 vessel CABG EXAM: PORTABLE CHEST 1 VIEW COMPARISON:  Prior chest x-ray 02/01/2015 FINDINGS: The patient has been extubated and the nasogastric tube removed. A right IJ vascular sheath convey is a Swan-Ganz catheter into the heart. The tip of the Swan overlies the proximal right lower lobe pulmonary artery. A right upper extremity PICC is again noted in good position with the tip overlying the upper right atrium. Mediastinal and left thoracic drains are present via a subxiphoid approach. Patient is status post median sternotomy with evidence of multivessel CABG. Stable cardiomegaly. Mediastinal contours are grossly unchanged given lower inspiratory volumes. Lower inspiratory volumes with increased bibasilar opacities favored to reflect atelectasis. Pulmonary vascular congestion without overt edema. No  evidence of pneumothorax or large effusion. No acute osseous abnormality. IMPRESSION: 1. Interval extubation and removal of nasogastric tube. 2. Lower inspiratory volumes with increasing bibasilar atelectasis. 3. Pulmonary vascular congestion without overt edema. 4. Other support apparatus in stable and satisfactory position as above. Electronically Signed   By: Malachy Moan M.D.   On: 02/02/2015 08:54   Dg Chest Port 1 View  02/01/2015  CLINICAL DATA:  Patient status post CABG procedure. EXAM: PORTABLE CHEST 1 VIEW COMPARISON:  Chest radiograph 01/26/2015. FINDINGS: ET tube terminates in the mid trachea. Enteric tube courses inferior to the diaphragm. Right IJ approach pulmonary arterial catheter is present with tip projecting over the expected location of the right main pulmonary artery. Multiple mediastinal drains are present. Right upper extremity PICC line is present with tip projecting at the superior cavoatrial junction. Left chest  tube in place. Stable cardiac and mediastinal contours status post median sternotomy and CABG procedure. No large area of pulmonary consolidation. No definite pleural effusion or pneumothorax. No aggressive or acute appearing osseous lesions. IMPRESSION: Support apparatus as above. Electronically Signed   By: Annia Belt M.D.   On: 02/01/2015 16:04   Dg Chest Port 1 View  01/25/2015  CLINICAL DATA:  Acute respiratory failure with hypoxemia.  Fever. EXAM: PORTABLE CHEST 1 VIEW COMPARISON:  01/23/2015. FINDINGS: Grossly stable enlarged cardiac silhouette. Prominent pulmonary vasculature and interstitial markings with improvement. Interval small amount of linear density in the right mid lung zone. Minimal right pleural fluid. Central peribronchial thickening. Unremarkable bones. IMPRESSION: 1. Improving changes of congestive heart failure. 2. Small amount of linear atelectasis in the right mid lung zone. 3. Moderate bronchitic changes. Electronically Signed   By: Beckie Salts M.D.   On: 01/25/2015 16:34   Dg Chest Port 1 View  01/23/2015  CLINICAL DATA:  Congestive heart failure EXAM: PORTABLE CHEST 1 VIEW COMPARISON:  None. FINDINGS: There is generalized interstitial edema with patchy alveolar edema in the bases. Heart is enlarged with pulmonary venous hypertension. No adenopathy. IMPRESSION: Evidence of congestive heart failure with edema most pronounced in the lung bases. Electronically Signed   By: Bretta Bang III M.D.   On: 01/23/2015 09:34   Mr Card Morphology Wo/w Cm  01/29/2015  CLINICAL DATA:  CAD, assess for viability EXAM: CARDIAC MRI TECHNIQUE: The patient was scanned on a 1.5 Tesla GE magnet. A dedicated cardiac coil was used. Functional imaging was done using Fiesta sequences. 2,3, and 4 chamber views were done to assess for RWMA's. Modified Simpson's rule using a short axis stack was used to calculate an ejection fraction on a dedicated work [employer]. The patient  received 34 cc of Multihance. After 10 minutes inversion recovery sequences were used to assess for infiltration and scar tissue. CONTRAST:  34 cc Multihance FINDINGS: There were bilateral small to moderate pleural effusions. There was a trivial pericardial effusion. Normal left ventricular size. EF 26% with mid anteroseptal and mid anterior akinesis. The mid anterolateral wall was hypokinetic. The apical septal, inferior, and anterior segments were akinetic and the apical lateral segment was hypokinetic. The true apex was akinetic. A small apical thrombus was noted. The atria were normal in size. The right ventricle was normal in size and systolic function. The aortic valve was trileaflet with no stenosis or significant regurgitation. There did not appear to be significant mitral regurgitation. On delayed enhancement imaging, there was subendocardial 76-99% wall thickness late gadolinium enhancement (LGE) in the mid anteroseptal and mid anterior wall  segments, in the apical septal/anterior/inferior wall segments, and in the true apex. Basal segments without LGE, anterolateral wall without significant LGE, and mid inferior wall without LGE. MEASUREMENTS: MEASUREMENTS LV EDV 212 mL LV SV 54 mL LV EF 26% IMPRESSION: 1. Severe LV systolic dysfunction, EF 26%. Wall motion abnormalities as above. 2.  Normal RV size and systolic function . 3. LGE pattern suggestive of infarction in LAD territory. The mid anterior/anteroseptal and apical anterior/septal/inferior wall segments and the true apex do not appear viable (probably not likely to improve function with revascularization). The remainder of the left ventricle appears viable. 4.  There is a small LV apical thrombus. Keylin Podolsky Electronically Signed   By: [name] M.D.   On: 01/29/2015 09:10    PHYSICAL EXAM General: Elderly appearing, NAD . In bed Neck: JVP 7-8 cm, no thyromegaly or lymphadenopathy noted  Lungs: Decreased bases. CV: Nondisplaced PMI.   Heart regular S1/S2, no S3/S4, no murmur. Erythema and edema around SVG harvest site on R. Trace - +1 edema L. R leg exam limited with tenderness. No carotid bruit.  Normal pedal pulses.  Abdomen: Soft, non-tender, non-distended, no HSM. No bruits or masses. +BS  Neurologic: Alert and oriented x 3.  Psych: Normal affect. Extremities: No clubbing or cyanosis. Left LE cool to the touch.   TELEMETRY: Reviewed personally, v-paced 60s, no VT   ASSESSMENT AND PLAN: 65 yo with history of DM and smoking but has not had medical care in decades presented with acute systolic CHF.  Echo showed EF 25-30% with apical thrombus and LHC showed 3VD. 1. Ventricular fibrillation arrest: ; suspect scar-mediated - No further VT on po amio.  - K stable and > 4.0  - Intrinsic rate changed to 60s yesterday.  2. Acute systolic CHF: EF 16% with regional WMAs, ischemic cardiomyopathy.  Now s/p CABG x 4.   - Milrinone stopped 02/09/15. Co-ox remains marginal at 55 but he states he feels fine. Creatnine up again. - Losartan started 02/09/15. No b-blocker yet - Volume status stable. Bed weights innaccurate. - With bump in Creatinine will transition to po diuretics. Received 40 mg IV this am, so will place order for 40 mg po daily starting tomorrow.  - Continue spironolactone 25 mg daily.  3. CAD: Diffuse 3 vessel disease, see cath report above. MRI reviewed: LAD territory did not appear to have significant viability.  Remainder of LV myocardium did appear viable.  He had severe disease in LCx and RCA territory that may benefit from revascularization but suspect LAD territory will not recover.   - s/p CABG x 4 on 1/6.  Suspect ventricular fibrillation arrest 1/10 was scar-mediated as opposed to occlusion of graft, etc.  - Continue ASA, atorvastatin 80 daily.  4. LV mural thrombus: Seen on 1/10 echo, mobile thrombus.  Now on subQ lovenox. - Well need transition to NOAC prior to discharge.    5. Diabetes:  -  Continue  insulin.  6. Foot lesions:  - WOC consult appreciated.  - Continue dry dressing per WOC 7. Diabetic neuropathy:  - Continue gabapentin 300 mg bid.  8. R foot/calf pain and increased swelling compared to L side - VAS [place] with NO DVT. Now on cephalexin for possible infection.  9. Deconditioning:  - Continue to mobilize as able.  Graciella Freer PA-C 02/12/2015 10:06 AM   Advanced Heart Failure Team Pager [phone] (M-F; 7a - 4p)  Please contact [facility] for night-coverage after hours (4p -7a ) and  weekends on [email or web address]  Patient seen with PA, agree with the above note.  Mr Horger walked with cardiac rehab today, seems to have actually done pretty well.  Right leg felt better with walking.  No significant dyspnea.   Agree with transition to po Lasix and decrease in KCl.  Volume status looks ok.   Co-ox 55%, marginal but would not put back on milrinone.  Continue digoxin, level 0.6 today.   Remains in paced rhythm but in NSR at higher rate when walking per rehab note.   Continue Lovenox for now, transition to DOAC prior to d/c after pacing wires out (LV thrombus).   [name] 02/12/2015 12:47 PM

## 2015-02-12 NOTE — Progress Notes (Signed)
CARDIAC REHAB PHASE I   PRE:  Rate/Rhythm: 60 pacing    BP: sitting 99/68    SaO2: 98 RA  MODE:  Ambulation: 150 ft   POST:  Rate/Rhythm: 71 SR    BP: sitting 100/69     SaO2: 99 RA  Pt able to stand and walk with min assist and RW (however used assist x2 with gait belt for safety). Did have c/o burning pain in lower right extremity, esp to touch. Able to walk without limping and stated the pain actually felt better with distance. Return to recliner. HR 71 SR, overriding pacer at end of walk. x1 c/o dizziness aft 20 ft but seemed to pass as he did not c/o of it again. Pt seemed to be encouraged and sts he will walk with PT later today. Will f/u tomorrow. [phone]  Elissa Lovett [place] CES, ACSM 02/12/2015 11:14 AM

## 2015-02-13 LAB — CBC
HCT: 30.8 % — ABNORMAL LOW (ref 39.0–52.0)
HEMOGLOBIN: 10.1 g/dL — AB (ref 13.0–17.0)
MCH: 27.6 pg (ref 26.0–34.0)
MCHC: 32.8 g/dL (ref 30.0–36.0)
MCV: 84.2 fL (ref 78.0–100.0)
Platelets: 280 10*3/uL (ref 150–400)
RBC: 3.66 MIL/uL — AB (ref 4.22–5.81)
RDW: 14.1 % (ref 11.5–15.5)
WBC: 10 10*3/uL (ref 4.0–10.5)

## 2015-02-13 LAB — BASIC METABOLIC PANEL
ANION GAP: 5 (ref 5–15)
Anion gap: 17 — ABNORMAL HIGH (ref 5–15)
BUN: 16 mg/dL (ref 6–20)
BUN: 16 mg/dL (ref 6–20)
CALCIUM: 8.7 mg/dL — AB (ref 8.9–10.3)
CHLORIDE: 93 mmol/L — AB (ref 101–111)
CO2: 27 mmol/L (ref 22–32)
CO2: 30 mmol/L (ref 22–32)
CREATININE: 1.43 mg/dL — AB (ref 0.61–1.24)
Chloride: 99 mmol/L — ABNORMAL LOW (ref 101–111)
Creatinine, Ser: 1.39 mg/dL — ABNORMAL HIGH (ref 0.61–1.24)
GFR calc Af Amer: 57 mL/min — ABNORMAL LOW (ref >60)
GFR calc non Af Amer: 50 mL/min — ABNORMAL LOW (ref >60)
GFR, EST AFRICAN AMERICAN: 59 mL/min — AB (ref >60)
GFR, EST NON AFRICAN AMERICAN: 51 mL/min — AB (ref >60)
Glucose, Bld: 126 mg/dL — ABNORMAL HIGH (ref 65–99)
Glucose, Bld: 140 mg/dL — ABNORMAL HIGH (ref 65–99)
POTASSIUM: 4.1 mmol/L (ref 3.5–5.1)
SODIUM: 137 mmol/L (ref 135–145)
Sodium: 134 mmol/L — ABNORMAL LOW (ref 135–145)

## 2015-02-13 LAB — GLUCOSE, CAPILLARY
GLUCOSE-CAPILLARY: 84 mg/dL (ref 65–99)
GLUCOSE-CAPILLARY: 153 mg/dL — AB (ref 65–99)
GLUCOSE-CAPILLARY: 128 mg/dL — AB (ref 65–99)
GLUCOSE-CAPILLARY: 165 mg/dL — AB (ref 65–99)

## 2015-02-13 LAB — CARBOXYHEMOGLOBIN
CARBOXYHEMOGLOBIN: 2.5 % — AB (ref 0.5–1.5)
Methemoglobin: 0.8 % (ref 0.0–1.5)
O2 SAT: 69.5 %
TOTAL HEMOGLOBIN: 10.6 g/dL — AB (ref 13.5–18.0)

## 2015-02-13 MED ORDER — AMIODARONE HCL 200 MG PO TABS
200.0000 mg | ORAL_TABLET | Freq: Every day | ORAL | Status: DC
  Administered 2015-02-14 – 2015-02-15 (×2): 200 mg via ORAL
  Filled 2015-02-13 (×2): qty 1

## 2015-02-13 MED FILL — Medication: Qty: 1 | Status: AC

## 2015-02-13 NOTE — Progress Notes (Signed)
CARDIAC REHAB PHASE I   PRE:  Rate/Rhythm: 63 SR  BP:  Sitting: 107/56        SaO2: 97 RA  MODE:  Ambulation: 150 ft   POST:  Rate/Rhythm: 72 SR  BP:  Sitting: 105/64         SaO2: 99 RA  Pt still c/o of burning/pain in his R foot/ankle, agreeable to walk. Pt stood with minimal assistance, reminded of sternal precautions, c/o feeling unsteady, not wanting to put pressure on R foot. Pt ambulated 150 ft on RA, rolling walker, gait belt, IV, assist x2 for safety (pt had c/o dizziness earlier today), increasingly steady gait with distance, tolerated well. Pt c/o mild dizziness upon returning to room, reports feeling his "leg loosen up" with ambulation, denies DOE, declined rest stop. Pt needs much encouragement. Pt to recliner after walk, feet elevated, call bell within reach. Encouraged IS, additional ambulation today, pt verbalized understanding. Will follow.   [id number] Joylene Grapes, RN, BSN 02/13/2015 11:06 AM

## 2015-02-13 NOTE — Progress Notes (Addendum)
Patient ID: Bradley Benitez, male   DOB: [birth year], 65 y.o.   MRN: [id number]   65 yo with history of DM and smoking but has not had medical care in decades presented with acute systolic CHF.  Echo showed EF 25-30% with apical thrombus and LHC showed 3VD.  Underwent CABG 1/6. On 1/10 had VF arrest with defib x 9.   Yesterday IV lasix stopped with plan to transition to po lasix today. Ambulated 150 feet with PT.   Refusing antibiotics and carafate. Denies SOB.   Scheduled Meds: . amiodarone  200 mg Oral BID  . aspirin EC  325 mg Oral Daily  . atorvastatin  80 mg Oral q1800  . bisacodyl  10 mg Oral Daily   Or  . bisacodyl  10 mg Rectal Daily  . cephALEXin  500 mg Oral TID WC  . chlorhexidine   Topical Daily  . digoxin  0.125 mg Oral Daily  . docusate sodium  200 mg Oral Daily  . enoxaparin (LOVENOX) injection  50 mg Subcutaneous Q12H  . feeding supplement (GLUCERNA SHAKE)  237 mL Oral TID BM  . furosemide  40 mg Oral Daily  . insulin aspart  0-15 Units Subcutaneous TID WC  . insulin detemir  20 Units Subcutaneous Daily  . levalbuterol  0.63 mg Nebulization TID  . losartan  25 mg Oral BID  . pantoprazole  40 mg Oral Daily  . potassium chloride  20 mEq Oral Daily  . sodium chloride  3 mL Intravenous Q12H  . spironolactone  25 mg Oral Daily  . sucralfate  1 g Oral TID WC & HS   Continuous Infusions: . sodium chloride 250 mL (02/05/15 0730)  . sodium chloride 20 mL/hr at 02/09/15 1154   PRN Meds:.acetaminophen, alum & mag hydroxide-simeth, Gerhardt's butt cream, lactulose, ondansetron (ZOFRAN) IV, oxyCODONE, sodium chloride, traMADol    Filed Vitals:   02/12/15 1433 02/12/15 1927 02/12/15 2015 02/13/15 0521  BP:   123/66 100/53  Pulse:  72 68 67  Temp:   97.9 F (36.6 C) 97.4 F (36.3 C)  TempSrc:   Oral Oral  Resp:  Height:      Weight:   217 lb 9.6 oz (98.703 kg) 216 lb (97.977 kg)  SpO2: 98% 97% 97% 100%    Intake/Output Summary (Last 24 hours) at 02/13/15  0832 Last data filed at 02/13/15 0804  Gross per 24 hour  Intake     10 ml  Output   1225 ml  Net  -1215 ml    LABS: Basic Metabolic Panel:  Recent Labs  13/08/65 0431 02/12/15 0440  NA 139 135  K 4.2 4.5  CL 104 95*  CO2 29 29  GLUCOSE 114* 137*  BUN 14 16  CREATININE 1.28* 1.36*  CALCIUM 8.7* 8.8*   Liver Function Tests: No results for input(s): AST, ALT, ALKPHOS, BILITOT, PROT, ALBUMIN in the last 72 hours. No results for input(s): LIPASE, AMYLASE in the last 72 hours. CBC:  Recent Labs  02/11/15 0431  WBC 9.2  HGB 9.6*  HCT 29.5*  MCV 84.3  PLT 312   Cardiac Enzymes: No results for input(s): CKTOTAL, CKMB, CKMBINDEX, TROPONINI in the last 72 hours. BNP: Invalid input(s): POCBNP D-Dimer: No results for input(s): DDIMER in the last 72 hours. Hemoglobin A1C: No results for input(s): HGBA1C in the last 72 hours. Fasting Lipid Panel: No results for input(s): CHOL, HDL, LDLCALC, TRIG, CHOLHDL, LDLDIRECT in the last 72  hours. Thyroid Function Tests: No results for input(s): TSH, T4TOTAL, T3FREE, THYROIDAB in the last 72 hours.  Invalid input(s): FREET3 Anemia Panel: No results for input(s): VITAMINB12, FOLATE, FERRITIN, TIBC, IRON, RETICCTPCT in the last 72 hours.  RADIOLOGY: Dg Chest 2 View  02/10/2015  CLINICAL DATA:  Acute respiratory failure with hypoxemia. Congestive heart failure. Poorly controlled diabetes with diabetic neuropathy. EXAM: CHEST  2 VIEW COMPARISON:  02/09/2015 FINDINGS: Right arm PICC line in right subclavian central venous catheter remain in appropriate position. Heart size is normal. Prior CABG again noted. Bibasilar atelectasis or infiltrates show no significant change. Probable tiny bilateral pleural effusions again noted. IMPRESSION: No significant change in bibasilar atelectasis or infiltrates and probable tiny bilateral pleural effusions. Electronically Signed   By: Myles Rosenthal M.D.   On: 02/10/2015 08:57   Dg Chest 2  View  02/05/2015  CLINICAL DATA:  Shortness of breath, status post CABG 4 days ago. EXAM: CHEST  2 VIEW COMPARISON:  PA and lateral chest x-ray of February 04, 2015 FINDINGS: The left lung is well-expanded. There is minimal basilar atelectasis medially. On the right a small amount of pleural fluid remains. The cardiac silhouette is mildly enlarged. The pulmonary vascularity is not engorged. There is no pneumothorax or pneumomediastinum. There are 7 intact sternal wires. The right-sided PICC line tip projects over the junction of the distal SVC with the right atrium. IMPRESSION: Further interval improvement in pulmonary interstitial edema. Persistent left basilar subsegmental atelectasis and persistent trace right pleural effusion. Electronically Signed   By: Mance  [place] M.D.   On: 02/05/2015 07:53   Dg Chest 2 View  02/04/2015  CLINICAL DATA:  Acute respiratory failure, CHF, previous MI, former smoker. EXAM: CHEST  2 VIEW COMPARISON:  Portable chest x-ray of February 03, 2015 FINDINGS: The lungs are borderline hypoinflated. There is a trace of pleural fluid on the right. Right basilar atelectasis or pneumonia has improved. There remains retrocardiac subsegmental atelectasis but it has improved as well. The cardiac silhouette remains enlarged. The pulmonary vascularity is less engorged. There are post CABG changes. The right-sided PICC line tip projects over the distal third of the SVC. The right internal jugular Cordis sheath has been removed. IMPRESSION: Mild interval improvement in bibasilar atelectasis or pneumonia. Trace right pleural effusion remains. Stable mild cardiomegaly with decreased pulmonary vascular congestion. Electronically Signed   By: Raye  [place] M.D.   On: 02/04/2015 08:04   Dg Chest 2 View  01/26/2015  CLINICAL DATA:  Shortness of breath this AM; h/o diabetes; former smoker EXAM: CHEST  2 VIEW COMPARISON:  01/25/2015 FINDINGS: Heart size is normal. There is prominence of interstitial  markings, increased. Perihilar peribronchial thickening and noted. There are no focal consolidations. There are small bilateral pleural effusions. IMPRESSION: 1. Increased bronchitic changes. 2. Increased bilateral pleural effusions. Electronically Signed   By: Norva Pavlov M.D.   On: 01/26/2015 10:34   Dg Chest Port 1 View  02/09/2015  CLINICAL DATA:  Short of breath EXAM: PORTABLE CHEST 1 VIEW COMPARISON:  02/07/2015 FINDINGS: Normal heart size. Lungs under aerated with bibasilar hypoaeration. Small pleural effusions may be present. No pneumothorax. Right subclavian central venous catheter tip at the cavoatrial junction. IMPRESSION: Bibasilar atelectasis Small pleural effusions may be present. Electronically Signed   By: Jolaine Click M.D.   On: 02/09/2015 10:32   Dg Chest Port 1 View  02/07/2015  CLINICAL DATA:  CABG EXAM: PORTABLE CHEST 1 VIEW COMPARISON:  02/06/2015 FINDINGS: Right PICC and right subclavian  central venous catheter are stable. Normal heart size. Low volumes. Bibasilar atelectasis. Vascular congestion has resolved. Small pleural effusions are suspected. IMPRESSION: Resolved vascular congestion. Bibasilar atelectasis. Small pleural effusions persist. Electronically Signed   By: Jolaine Click M.D.   On: 02/07/2015 07:36   Dg Chest Port 1 View  02/06/2015  CLINICAL DATA:  CABG. EXAM: PORTABLE CHEST 1 VIEW COMPARISON:  02/05/2015. FINDINGS: Right PICC line in stable position. Prior CABG. Cardiomegaly with pulmonary venous congestion and interstitial prominence, no change from prior exam. Findings consistent with stable changes of congestive heart failure. Small bilateral pleural effusions. No pneumothorax. IMPRESSION: 1. Right PICC line in stable position. 2. Prior CABG. Persistent changes of congestive heart failure with mild pulmonary interstitial edema and small pleural effusions. Electronically Signed   By: Maisie Fus  Register   On: 02/06/2015 07:20   Dg Chest Port 1 View  02/05/2015   CLINICAL DATA:  Code. EXAM: PORTABLE CHEST 1 VIEW COMPARISON:  Earlier today at 627 hours.  One day prior. FINDINGS: 741 hours. Extensive artifact degradation due to overlying support apparatus, external pacer. A right sided PICC line terminates at the low SVC. Patient rotated left. Prior median sternotomy. Cardiomegaly accentuated by AP portable technique. Left costophrenic angle excluded. Probable layering right pleural effusion. No pneumothorax. Low lung volumes, accentuating interstitial edema. Developing right base airspace disease. IMPRESSION: Developing congestive heart failure. Probable small right pleural effusion with adjacent right base atelectasis. Extensive artifact degradation. Electronically Signed   By: Jeronimo Greaves M.D.   On: 02/05/2015 07:50   Dg Chest Port 1 View  02/03/2015  CLINICAL DATA:  Atelectasis EXAM: PORTABLE CHEST 1 VIEW COMPARISON:  Chest x-rays dated 02/02/2015 and 02/01/2015. FINDINGS: Left-sided chest tube is no longer seen. Swan-Ganz catheter is also no longer seen. Mediastinal drains have also been removed. Right IJ central line access site remains with tip projected over the upper SVC. Right-sided PICC line appears well positioned with tip projected over the lower SVC. Cardiomediastinal silhouette is stable in size and configuration. Cardiomegaly is stable. Median sternotomy wires are intact and stable in alignment, presumably for CABG. There is mild central pulmonary vascular congestion. Ill-defined bibasilar airspace opacities are not not significantly changed, presumably atelectasis and/or small effusions. No new lung findings. No pneumothorax seen. IMPRESSION: 1. Multiple tubes and lines removed, as above. 2. Persistent mild central pulmonary vascular congestion without evidence of overt pulmonary edema. 3. Persistent ill-defined bibasilar airspace opacities, presumably atelectasis and/or small effusions. 4. No new lung findings. 5. Stable cardiomegaly. Electronically Signed    By: Bary Richard M.D.   On: 02/03/2015 08:02   Dg Chest Port 1 View  02/02/2015  CLINICAL DATA:  65 year old male status post 4 vessel CABG EXAM: PORTABLE CHEST 1 VIEW COMPARISON:  Prior chest x-ray 02/01/2015 FINDINGS: The patient has been extubated and the nasogastric tube removed. A right IJ vascular sheath convey is a Swan-Ganz catheter into the heart. The tip of the Swan overlies the proximal right lower lobe pulmonary artery. A right upper extremity PICC is again noted in good position with the tip overlying the upper right atrium. Mediastinal and left thoracic drains are present via a subxiphoid approach. Patient is status post median sternotomy with evidence of multivessel CABG. Stable cardiomegaly. Mediastinal contours are grossly unchanged given lower inspiratory volumes. Lower inspiratory volumes with increased bibasilar opacities favored to reflect atelectasis. Pulmonary vascular congestion without overt edema. No evidence of pneumothorax or large effusion. No acute osseous abnormality. IMPRESSION: 1. Interval extubation and removal of  nasogastric tube. 2. Lower inspiratory volumes with increasing bibasilar atelectasis. 3. Pulmonary vascular congestion without overt edema. 4. Other support apparatus in stable and satisfactory position as above. Electronically Signed   By: Malachy Moan M.D.   On: 02/02/2015 08:54   Dg Chest Port 1 View  02/01/2015  CLINICAL DATA:  Patient status post CABG procedure. EXAM: PORTABLE CHEST 1 VIEW COMPARISON:  Chest radiograph 01/26/2015. FINDINGS: ET tube terminates in the mid trachea. Enteric tube courses inferior to the diaphragm. Right IJ approach pulmonary arterial catheter is present with tip projecting over the expected location of the right main pulmonary artery. Multiple mediastinal drains are present. Right upper extremity PICC line is present with tip projecting at the superior cavoatrial junction. Left chest tube in place. Stable cardiac and  mediastinal contours status post median sternotomy and CABG procedure. No large area of pulmonary consolidation. No definite pleural effusion or pneumothorax. No aggressive or acute appearing osseous lesions. IMPRESSION: Support apparatus as above. Electronically Signed   By: Annia Belt M.D.   On: 02/01/2015 16:04   Dg Chest Port 1 View  01/25/2015  CLINICAL DATA:  Acute respiratory failure with hypoxemia.  Fever. EXAM: PORTABLE CHEST 1 VIEW COMPARISON:  01/23/2015. FINDINGS: Grossly stable enlarged cardiac silhouette. Prominent pulmonary vasculature and interstitial markings with improvement. Interval small amount of linear density in the right mid lung zone. Minimal right pleural fluid. Central peribronchial thickening. Unremarkable bones. IMPRESSION: 1. Improving changes of congestive heart failure. 2. Small amount of linear atelectasis in the right mid lung zone. 3. Moderate bronchitic changes. Electronically Signed   By: Beckie Salts M.D.   On: 01/25/2015 16:34   Dg Chest Port 1 View  01/23/2015  CLINICAL DATA:  Congestive heart failure EXAM: PORTABLE CHEST 1 VIEW COMPARISON:  None. FINDINGS: There is generalized interstitial edema with patchy alveolar edema in the bases. Heart is enlarged with pulmonary venous hypertension. No adenopathy. IMPRESSION: Evidence of congestive heart failure with edema most pronounced in the lung bases. Electronically Signed   By: Bretta Bang III M.D.   On: 01/23/2015 09:34   Mr Card Morphology Wo/w Cm  01/29/2015  CLINICAL DATA:  CAD, assess for viability EXAM: CARDIAC MRI TECHNIQUE: The patient was scanned on a 1.5 Tesla GE magnet. A dedicated cardiac coil was used. Functional imaging was done using Fiesta sequences. 2,3, and 4 chamber views were done to assess for RWMA's. Modified Simpson's rule using a short axis stack was used to calculate an ejection fraction on a dedicated work [employer]. The patient received 34 cc of Multihance. After 10  minutes inversion recovery sequences were used to assess for infiltration and scar tissue. CONTRAST:  34 cc Multihance FINDINGS: There were bilateral small to moderate pleural effusions. There was a trivial pericardial effusion. Normal left ventricular size. EF 26% with mid anteroseptal and mid anterior akinesis. The mid anterolateral wall was hypokinetic. The apical septal, inferior, and anterior segments were akinetic and the apical lateral segment was hypokinetic. The true apex was akinetic. A small apical thrombus was noted. The atria were normal in size. The right ventricle was normal in size and systolic function. The aortic valve was trileaflet with no stenosis or significant regurgitation. There did not appear to be significant mitral regurgitation. On delayed enhancement imaging, there was subendocardial 76-99% wall thickness late gadolinium enhancement (LGE) in the mid anteroseptal and mid anterior wall segments, in the apical septal/anterior/inferior wall segments, and in the true apex. Basal segments without LGE, anterolateral  wall without significant LGE, and mid inferior wall without LGE. MEASUREMENTS: MEASUREMENTS LV EDV 212 mL LV SV 54 mL LV EF 26% IMPRESSION: 1. Severe LV systolic dysfunction, EF 26%. Wall motion abnormalities as above. 2.  Normal RV size and systolic function . 3. LGE pattern suggestive of infarction in LAD territory. The mid anterior/anteroseptal and apical anterior/septal/inferior wall segments and the true apex do not appear viable (probably not likely to improve function with revascularization). The remainder of the left ventricle appears viable. 4.  There is a small LV apical thrombus. Nakira Litzau Electronically Signed   By: [name] M.D.   On: 01/29/2015 09:10    PHYSICAL EXAM General: Elderly appearing, NAD . In chair.  Neck: JVP 5-6 cm, no thyromegaly or lymphadenopathy noted  Lungs: Decreased bases. CV: Nondisplaced PMI.  Heart regular S1/S2, no S3/S4, no  murmur. Erythema and edema around SVG harvest site on R. . No carotid bruit.  Normal pedal pulses.  Abdomen: Soft, non-tender, non-distended, no HSM. No bruits or masses. +BS  Neurologic: Alert and oriented x 3.  Psych: Normal affect. Extremities: No clubbing or cyanosis. RLE ecchymotic. LLE no edema.  RLE tender  TELEMETRY: SR 60s  ASSESSMENT AND PLAN: 65 yo with history of DM and smoking but has not had medical care in decades presented with acute systolic CHF.  Echo showed EF 25-30% with apical thrombus and LHC showed 3VD. 1. Ventricular fibrillation arrest: ; suspect scar-mediated - No further VT on po amio.  - BMET pending.   - Intrinsic rate changed to 60s yesterday.  2. Acute systolic CHF: EF 16% with regional WMAs, ischemic cardiomyopathy.  Now s/p CABG x 4.   - Milrinone stopped 02/09/15. Todays CO-OX is 70%.  - Losartan started 02/09/15. No b-blocker yet - BMET pending. Volume status stable. Start 40 mg po lasix daily.  -- Continue spironolactone 25 mg daily.  3. CAD: Diffuse 3 vessel disease, see cath report above. MRI reviewed: LAD territory did not appear to have significant viability.  Remainder of LV myocardium did appear viable.  He had severe disease in LCx and RCA territory that may benefit from revascularization but suspect LAD territory will not recover.   - s/p CABG x 4 on 1/6.  Suspect ventricular fibrillation arrest 1/10 was scar-mediated as opposed to occlusion of graft, etc.  - Continue ASA, atorvastatin 80 daily.  4. LV mural thrombus: Seen on 1/10 echo, mobile thrombus.  Now on subQ lovenox. - Well need transition to NOAC prior to discharge once pacing wires out.  5. Diabetes:  -  Continue insulin.  6. Foot lesions:  - WOC consult appreciated.  - Continue dry dressing per WOC 7. Diabetic neuropathy:  - Continue gabapentin 300 mg bid.  8. R foot/calf pain and increased swelling compared to L side - VAS [place] with NO DVT. Now on cephalexin for possible infection.   9. Deconditioning:  - Continue to mobilize as able. PT following with recommendations for SNF.    Amy Clegg NP-C  02/13/2015 8:32 AM   Advanced Heart Failure Team Pager [phone] (M-F; 7a - 4p)  Please contact [facility] for night-coverage after hours (4p -7a ) and weekends on [email or web address]  Patient seen with NP, agree with the above note.  He walked halls today, denied dyspnea but felt "dizzy."  Orthostatics negative yesterday, not done today.  - Check orthostatics now, if orthostatic would decrease losartan.  Feels better, less GI upset, off cephalexin.   Telemetry reviewed: sinus  rhythm, upper 40s to lower 60s at rest, up to 70s with ambulation.  Not markedly bradycardic.  Suspect that he will not have to have a pacemaker but will continue to monitor.  Pacing wires still in.  - I will decrease amiodarone to 200 mg once daily (currently on bid).   Continue digoxin, losartan, and spironolactone at current doses.  No beta blocker with SBP in 100s and mild bradycardia. Good co-ox today.   Creatinine stable today, continue current po Lasix.  He looks near-euvolemic.   [name] 02/13/2015

## 2015-02-13 NOTE — Care Management Note (Signed)
Case Management Note Previous CM note initiated by [facility] RN, CM  Patient Details  Name: Bradley Benitez MRN: [id number] Date of Birth: 04-21-1950  Subjective/Objective:   With pt's current healthcare plan, Eliquis and Xarelto are Tier 3 and pt would be responsible for 33% of the cost - pt's income is very limited and he could not afford.  Multiple telephone calls to pt's healthcare plan and pharmaceutical company yielded "nothing we can do to change that" despite explanation of pt's situation.  Discussed situation with [employer] rep, Bradley Benitez who said pt can enroll in Patient Assistance Program even though he has Medicare.  Application taken to pt, sister-in-law, Bradley Benitez [phone]) will complete application and include pt's social security statement of income for CM to fax to [facility].  Provided card for free 30-day supply of Eliquis.                          Subjective/Objective:                  sister from [name] [phone])   Action/Plan: CSW following for STSNF placement  Expected Discharge Date:                  Expected Discharge Plan:  [place]  In-House Referral:  Clinical Social Work  Discharge planning Services  CM Consult, Medication Assistance  Post Acute Care Choice:    Choice offered to:     DME Arranged:    DME Agency:     HH Arranged:    [place] Agency:     Status of Service:  In process, will continue to follow  Medicare Important Message Given:  Yes Date Medicare IM Given:    Medicare IM give by:    Date Additional Medicare IM Given:    Additional Medicare Important Message give by:     If discussed at [place] of Stay Meetings, dates discussed:  02/11/15  Additional Comments:  02/13/15- 1015- Bradley Gibbons RN, BSN - met with pt and sister at bedside regarding Eliquis needs for discharge- pt had been given both 30 day free and copay assist cards along with pt assistance  application due to his copay being high and his limited income- pt has filled out his part of application and now need MD to fill out physician part- have placed application on shadow chart for MD to fill out-and left sticky note- pt also has SS info on income- have made copy of this to send along with application. Pt and sister both understand that application has to be processed before pt will know if he has been approved for further assistance past his 30 day free. Pt is looking into STSNF and has list of offers- will f/u with CSW once he has chosen bed. CM will f/u on Eliquis application prior to discharge.   Dawayne Patricia, RN 02/13/2015, 11:15 AM

## 2015-02-13 NOTE — Progress Notes (Signed)
Received call from lab that am BMET contaminated. Lab results from 02/13/15 at 0820 likely inaccurate. Ordered stat BMET to replace.  Leonidas Romberg, RN

## 2015-02-13 NOTE — Progress Notes (Signed)
[address]       [place] [address]             [phone]      12 Days Post-Op Procedure(s) (LRB): CORONARY ARTERY BYPASS GRAFTING (CABG)x 4 using left internal mammary artery and right greater saphenous leg vein using endoscope. (N/A) TRANSESOPHAGEAL ECHOCARDIOGRAM (TEE) (N/A)  CENTRIMAG VENTRICULAR ASSIST DEVICE, back up (N/A) Subjective: Feels "extremely dizzy", feels the abx are upsetting GI system. Brady in 40's at times  Objective: Vital signs in last 24 hours: Temp:  [97.4 F (36.3 C)-98 F (36.7 C)] 97.4 F (36.3 C) (01/18 0521) Pulse Rate:  [64-72] 67 (01/18 0521) Cardiac Rhythm:  [-] Normal sinus rhythm (01/18 0700) Resp:  [18-20] 20 (01/18 0521) BP: (100-123)/(53-66) 100/53 mmHg (01/18 0521) SpO2:  [97 %-100 %] 100 % (01/18 0521) Weight:  [216 lb (97.977 kg)-217 lb 9.6 oz (98.703 kg)] 216 lb (97.977 kg) (01/18 0521)  Hemodynamic parameters for last 24 hours:    Intake/Output from previous day: 01/17 0701 - 01/18 0700 In: 10 [I.V.:10] Out: 1025 [Urine:1025] Intake/Output this shift: Total I/O In: -  Out: 400 [Urine:400]  General appearance: alert, cooperative and no distress Heart: regular rate and rhythm Lungs: dim in bases Abdomen: benign Extremities: Minor edema Wound: incis healing well, some echymosis to evh site, no erethema  Lab Results:  Recent Labs  02/11/15 0431  WBC 9.2  HGB 9.6*  HCT 29.5*  PLT 312   BMET:  Recent Labs  02/11/15 0431 02/12/15 0440  NA 139 135  K 4.2 4.5  CL 104 95*  CO2 29 29  GLUCOSE 114* 137*  BUN 14 16  CREATININE 1.28* 1.36*  CALCIUM 8.7* 8.8*    PT/INR: No results for input(s): LABPROT, INR in the last 72 hours. ABG    Component Value Date/Time   PHART 7.449 02/06/2015 0647   HCO3 22.5 02/06/2015 0647   TCO2 23 02/08/2015 1714   ACIDBASEDEF 1.0 02/06/2015 0647   O2SAT 69.5 02/13/2015 0535   CBG (last 3)   Recent Labs  02/12/15 1645 02/12/15 2032 02/13/15 0650    GLUCAP 120* 130* 84    Meds Scheduled Meds: . amiodarone  200 mg Oral BID  . aspirin EC  325 mg Oral Daily  . atorvastatin  80 mg Oral q1800  . bisacodyl  10 mg Oral Daily   Or  . bisacodyl  10 mg Rectal Daily  . cephALEXin  500 mg Oral TID WC  . chlorhexidine   Topical Daily  . digoxin  0.125 mg Oral Daily  . docusate sodium  200 mg Oral Daily  . enoxaparin (LOVENOX) injection  50 mg Subcutaneous Q12H  . feeding supplement (GLUCERNA SHAKE)  237 mL Oral TID BM  . furosemide  40 mg Oral Daily  . insulin aspart  0-15 Units Subcutaneous TID WC  . insulin detemir  20 Units Subcutaneous Daily  . levalbuterol  0.63 mg Nebulization TID  . losartan  25 mg Oral BID  . pantoprazole  40 mg Oral Daily  . potassium chloride  20 mEq Oral Daily  . sodium chloride  3 mL Intravenous Q12H  . spironolactone  25 mg Oral Daily  . sucralfate  1 g Oral TID WC & HS   Continuous Infusions: . sodium chloride 250 mL (02/05/15 0730)  . sodium chloride 20 mL/hr at 02/09/15 1154   PRN Meds:.acetaminophen, alum & mag hydroxide-simeth, Gerhardt's butt cream, lactulose, ondansetron (ZOFRAN) IV,  oxyCODONE, sodium chloride, traMADol  Xrays No results found.  Assessment/Plan: S/P Procedure(s) (LRB): CORONARY ARTERY BYPASS GRAFTING (CABG)x 4 using left internal mammary artery and right greater saphenous leg vein using endoscope. (N/A) TRANSESOPHAGEAL ECHOCARDIOGRAM (TEE) (N/A)  CENTRIMAG VENTRICULAR ASSIST DEVICE, back up (N/A)  1 doing well overall 2 CHF team assisting with management, co-ox 69 3 stop keflex with no current evidence of infection and significant GI upset 4 HR still slow at times- cont to observe off negative chronotropes. Hoping to avoid pacer if possible but could help stabilize heart function with poss junct /atrial dysrhythmia improvement. He describes himself as very dizzy, not clear if this is over-descriptive 5 sugars controlled 6 push rehab as able   LOS: 21 days     Buell Parcel E 02/13/2015

## 2015-02-14 LAB — CARBOXYHEMOGLOBIN
Carboxyhemoglobin: 1.9 % — ABNORMAL HIGH (ref 0.5–1.5)
METHEMOGLOBIN: 1 % (ref 0.0–1.5)
O2 SAT: 57.3 %
TOTAL HEMOGLOBIN: 10.3 g/dL — AB (ref 13.5–18.0)

## 2015-02-14 LAB — GLUCOSE, CAPILLARY
GLUCOSE-CAPILLARY: 103 mg/dL — AB (ref 65–99)
GLUCOSE-CAPILLARY: 163 mg/dL — AB (ref 65–99)
Glucose-Capillary: 135 mg/dL — ABNORMAL HIGH (ref 65–99)
Glucose-Capillary: 167 mg/dL — ABNORMAL HIGH (ref 65–99)

## 2015-02-14 LAB — BASIC METABOLIC PANEL
Anion gap: 7 (ref 5–15)
BUN: 17 mg/dL (ref 6–20)
CO2: 29 mmol/L (ref 22–32)
CREATININE: 1.4 mg/dL — AB (ref 0.61–1.24)
Calcium: 8.7 mg/dL — ABNORMAL LOW (ref 8.9–10.3)
Chloride: 101 mmol/L (ref 101–111)
GFR calc Af Amer: 59 mL/min — ABNORMAL LOW (ref >60)
GFR, EST NON AFRICAN AMERICAN: 51 mL/min — AB (ref >60)
Glucose, Bld: 111 mg/dL — ABNORMAL HIGH (ref 65–99)
Potassium: 4 mmol/L (ref 3.5–5.1)
SODIUM: 137 mmol/L (ref 135–145)

## 2015-02-14 MED ORDER — FUROSEMIDE 40 MG PO TABS
40.0000 mg | ORAL_TABLET | ORAL | Status: DC
  Administered 2015-02-15: 40 mg via ORAL
  Filled 2015-02-14: qty 1

## 2015-02-14 MED ORDER — APIXABAN 2.5 MG PO TABS
2.5000 mg | ORAL_TABLET | Freq: Two times a day (BID) | ORAL | Status: DC
Start: 2015-02-15 — End: 2015-02-15
  Administered 2015-02-15: 2.5 mg via ORAL
  Filled 2015-02-14: qty 1

## 2015-02-14 MED ORDER — LOSARTAN POTASSIUM 25 MG PO TABS
12.5000 mg | ORAL_TABLET | Freq: Two times a day (BID) | ORAL | Status: DC
  Administered 2015-02-14 – 2015-02-15 (×3): 12.5 mg via ORAL
  Filled 2015-02-14 (×3): qty 1

## 2015-02-14 MED ORDER — ASPIRIN 81 MG PO CHEW
81.0000 mg | CHEWABLE_TABLET | Freq: Every day | ORAL | Status: DC
  Administered 2015-02-15: 81 mg via ORAL
  Filled 2015-02-14: qty 1

## 2015-02-14 NOTE — Progress Notes (Addendum)
      [address]       [place] [address]             [phone]        13 Days Post-Op Procedure(s) (LRB): CORONARY ARTERY BYPASS GRAFTING (CABG)x 4 using left internal mammary artery and right greater saphenous leg vein using endoscope. (N/A) TRANSESOPHAGEAL ECHOCARDIOGRAM (TEE) (N/A)  CENTRIMAG VENTRICULAR ASSIST DEVICE, back up (N/A)  Subjective: Patient with some dizziness  Objective: Vital signs in last 24 hours: Temp:  [97.4 F (36.3 C)-97.8 F (36.6 C)] 97.4 F (36.3 C) (01/19 0655) Pulse Rate:  [59-72] 70 (01/19 0655) Cardiac Rhythm:  [-] Normal sinus rhythm (01/18 1900) Resp:  [18-19] 18 (01/19 0655) BP: (98-106)/(52-61) 104/61 mmHg (01/19 0849) SpO2:  [8 %-100 %] 98 % (01/18 2129) Weight:  [213 lb 1.6 oz (96.662 kg)] 213 lb 1.6 oz (96.662 kg) (01/19 0655)  Pre op weight 103 kg Current Weight  02/14/15 213 lb 1.6 oz (96.662 kg)      Intake/Output from previous day: 01/18 0701 - 01/19 0700 In: 480 [P.O.:480] Out: 1725 [Urine:1725]   Physical Exam:  Cardiovascular: RRR Pulmonary: Diminished at bases bilaterally; no rales, wheezes, or rhonchi. Abdomen: Soft, non tender, bowel sounds present. Extremities: RLE with minor edema, ecchymosis Wounds: Clean and dry.  No erythema or signs of infection.  Lab Results: CBC: Recent Labs  02/13/15 1030  WBC 10.0  HGB 10.1*  HCT 30.8*  PLT 280   BMET:  Recent Labs  02/13/15 1030 02/14/15 0438  NA 134* 137  K 4.1 4.0  CL 99* 101  CO2 30 29  GLUCOSE 140* 111*  BUN 16 17  CREATININE 1.39* 1.40*  CALCIUM 8.7* 8.7*    PT/INR:  Lab Results  Component Value Date   INR 1.44 02/01/2015   INR 1.15 01/30/2015   INR 1.10 01/25/2015   ABG:  INR: Will add last result for INR, ABG once components are confirmed Will add last 4 CBG results once components are confirmed  Assessment/Plan:  1. CV - Previous VT (defib x 9)SBP labile. SR in the 59' sthis am.On Amiodarone 200 mg daily,  Eliquis 2.5 mg bid, Cozaar 12.5 mg bid, Spironolactone 25 mg daily, and Digoxin 0.125 mg daily. 2.  Pulmonary - On room air. Encourage incentive spirometer 3. Acute systolic CHF-LVEF 25% 4.  Acute blood loss anemia - H and H yesterday 10.1 and 30.8 5. Creatine stable at 1.4 6. Remove EPW 7. DM-CBGs 128/165/103. On Insulin. Pre op HGA1C 9.9. Will need close medical follow up after discharge. 8. Deconditioned-continue with PT  ZIMMERMAN,DONIELLE MPA-C 02/14/2015,9:31 AM  donot increase apixiban more than 2.5 BID since he had recent heart surgery I will see him in office in 3 weeks and will increase dose then High risk for readmission , high risk for non compliance SNF  Tomorrow or over weekend  patient examined and medical record reviewed,agree with above note. Kathlee Nations Trigt III 02/14/2015

## 2015-02-14 NOTE — Progress Notes (Signed)
CARDIAC REHAB PHASE I   PRE:  Rate/Rhythm: 73 SR  BP:  Sitting: 102/57        SaO2: 97 RA  MODE:  Ambulation: 410 ft   POST:  Rate/Rhythm: 78 SR  BP:  Sitting: 123/71         SaO2: 99 RA  Pt c/o R foot pain and instability, very talkative today, required some assistance to stand, reminder cues for sternal precautions/safety. Pt ambulated 410 ft on RA, rolling walker, assist x1, mildly unsteady gait, tolerated well. Pt c/o dizziness, denies worsening pain, DOE, declined rest stop. Pt did significantly increase distance today, poor safety awareness. Pt to bed per pt request after walk, call bell within reach. VSS. Will follow up tomorrow.   [id number] Joylene Grapes, RN, BSN 02/14/2015 3:29 PM

## 2015-02-14 NOTE — Discharge Summary (Signed)
Physician Discharge Summary  Patient ID: Bradley Benitez MRN: [id number] DOB/AGE: [birth year] 65 y.o.  Admit date: 01/23/2015 Discharge date: 02/15/2015  Admission Diagnoses: Acute systolic congestive heart failure in the setting of non-STEMI  Discharge Diagnoses:  Principal Problem:   Acute systolic congestive heart failure, NYHA class 4 (HCC) Active Problems:   Acute respiratory failure with hypoxemia (HCC)   Hypertensive heart disease with congestive heart failure (HCC)   Poorly controlled type 2 diabetes mellitus with peripheral neuropathy (HCC)   Diabetic nephropathy associated with diabetes mellitus due to underlying condition (HCC)   NSTEMI (non-ST elevated myocardial infarction) (HCC)   Mural thrombus of cardiac apex (HCC)   Dyslipidemia   S/P CABG x 4  Ventricular fibrillation arrest    Patient Active Problem List   Diagnosis Date Noted  . 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 Acute respiratory failure with hypoxemia (HCC) 01/23/2015  . 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 Poorly controlled type 2 diabetes mellitus with peripheral neuropathy (HCC) 01/23/2015  . 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 Acute systolic congestive heart failure, NYHA class 4 (HCC) 01/23/2015  . NSTEMI (non-ST elevated myocardial infarction) (HCC) 01/23/2015  . Mural thrombus of cardiac apex (HCC) 01/23/2015  . Dyslipidemia 01/23/2015     HISTORY OF PRESENT ILLNESS: At time of presentation 65 y/o male with diabetes but currently does not go to the doctor or take any medications presented to the [facility] department on 01/23/2015 complaining of shortness of breath. He states that for the last 2 weeks she's been having increasing dyspnea on exertion and orthopnea. He notes chronic leg swelling which she does not think has changed significantly in the last few weeks. He says that he has  chronic neuropathic pain in his feet which she treats with cold compresses in the evenings. He currently takes no medications. He said that he had a cough the morning of 01/23/2015 which was productive of a milky appearing mucus. He denies fevers, chills, or sick contacts. He does note having some chest tightness in the last 2 weeks as well which was associated with his shortness of breath. In the emergency department he was noted to be profoundly hypoxemic, requiring BiPAP for respiratory assistance, he had an elevated lactic acid. He was given antibiotics for presumed sepsis.  Discharged Condition: fair  Hospital Course: The patient was admitted through the emergency department by the pulmonary critical care medicine department. Initial diagnoses included acute hypoxemic respiratory failure secondary to acute pulmonary edema in the setting of congestive heart failure. He also ruled in for non-STEMI. He was found to have acute on presumed chronic renal insufficiency as well as acute urinary retention.Marland Kitchen He was also found to have significant hyperglycemia and as noted he is a poorly controlled diabetic. Cardiology consultation was also obtained. Echocardiogram demonstrated an LVEF of 25-30% with a large anterior wall motion abnormality and an apical thrombus was also identified. Initial troponin in the emergency department was 2.5 and then greater than 10 suggesting acute myocardial infarction however EKG did not show acute changes. There were some noted Q waves in the V1 through V4 leads suggesting old myocardial infarction. Plans were undertaken to proceed with cardiac catheterization.This was performed by Dr. Excell Seltzer on 01/25/2015 and he was found to have severe multivessel coronary artery disease. Cardiothoracic surgical consultation was obtained with Kathlee Nations Trigt MD. he remained somewhat medically tenuous requiring aggressive congestive heart failure management  including inotropic support. He cardiac MRI  was felt to be required to evaluate for viability. This was done and ultimately was determined that the patient would be a candidate for high risk surgical coronary artery revascularization with the idea that he may require centramag assist device at time of discharge. Ultimately he was felt to be medically stable to proceed and on 02/01/2015 he was taken to the operating room at which time he underwent the following procedure:  DATE OF PROCEDURE: 02/01/2015 DATE OF DISCHARGE:   OPERATIVE REPORT   OPERATIONS: 1. Coronary artery bypass grafting x4 (left internal mammary artery to  left anterior descending artery, saphenous vein graft to ramus  intermedius, saphenous vein graft to obtuse marginal, saphenous  vein graft to distal right coronary artery). 2. Endoscopic harvest of right leg greater saphenous vein.  SURGEON: [name], M.D.  ASSISTANT: Rowe Clack, P.A.-C.  PREOPERATIVE DIAGNOSES: Severe three-vessel coronary artery disease, poorly-controlled diabetes, ischemic cardiomyopathy with non-ST- elevation myocardial infarction and acute pulmonary edema, evidence of viable myocardium in the inferior and lateral walls by cardiac MRI.  POSTOPERATIVE DIAGNOSES: Severe three-vessel coronary artery disease, poorly-controlled diabetes, ischemic cardiomyopathy with non-ST- elevation myocardial infarction and acute pulmonary edema, evidence of viable myocardium in the inferior and lateral walls by cardiac MRI.  ANESTHESIA: General by Dr. Judie Petit.  He was taken to the surgical intensive care unit in stable condition and did not require the central bag.  Post operative Hospital course:   He initially required aggressive inotropic support. He had an early episode of chest pain which resolved following IV Lopressor and EKG showed no acute changes. He had an expected acute blood loss anemia that did require transfusion. He had  postoperative congestive failure and volume excess requiring aggressive management including diuretics. Blood sugars were also treated with standard protocols initially with Glucomander and transition to insulin including sliding scale. He was able to be weaned from the ventilator without significant difficulty. He was progressing, however on 02/05/2015 developed episodes of sudden ventricular fibrillation requiring 9 defibrillations. This was felt to be most likely scar mediated and not an issue of graft failure. He has remained neurologically intact. He was treated aggressively with antidysrhythmic's and initially did require resumption of inotropic support including milrinone and levo fed. This was weaned over time. We have been assisted with the overall management by the advanced heart failure team. .  all routine lines, monitors and drainage devices have been discontinued over time. He has been slow in his physical recovery due to his multiple comorbidities as well as issues related to pain. He will require skilled nursing facility at time of discharge for further rehabilitation. He will be on a NOAC at discharge for the history of his mural thrombus. He has been ruled out for DVT.   Consults: cardiology and advanced heart failure                   PCCM  Significant Diagnostic Studies: angiography: cardiac cath and cardiac MRI, venous duplex  Treatments: surgery as described above                        Emergency defibrillation for vfib                         Discharge Exam: Blood pressure 91/63, pulse 71, temperature 97.4 F (36.3 C), temperature source Oral, resp. rate 20, height  (1.854 m), weight 208  lb 9.6 oz (94.62 kg), SpO2 95 %.   General appearance: alert, cooperative and no distress Heart: regular rate and rhythm Lungs: mildly dim in bases Abdomen: benign Extremities: minor edema Wound: echymosis to RLE is improved  Lab Results: Disposition:SNF    Medication List     STOP taking these medications        aspirin 325 MG tablet  Replaced by:  aspirin 81 MG chewable tablet      TAKE these medications        amiodarone 200 MG tablet  Commonly known as:  PACERONE  Take 1 tablet (200 mg total) by mouth daily.     apixaban 2.5 MG Tabs tablet  Commonly known as:  ELIQUIS  Take 1 tablet (2.5 mg total) by mouth 2 (two) times daily.     aspirin 81 MG chewable tablet  Chew 1 tablet (81 mg total) by mouth daily.     atorvastatin 80 MG tablet  Commonly known as:  LIPITOR  Take 1 tablet (80 mg total) by mouth daily at 6 PM.     cephALEXin 250 MG capsule  Commonly known as:  KEFLEX  Take 1 capsule (250 mg total) by mouth every 8 (eight) hours.     digoxin 0.125 MG tablet  Commonly known as:  LANOXIN  Take 1 tablet (0.125 mg total) by mouth daily.     docusate sodium 100 MG capsule  Commonly known as:  COLACE  Take 2 capsules (200 mg total) by mouth daily.     feeding supplement (GLUCERNA SHAKE) Liqd  Take 237 mLs by mouth 3 (three) times daily between meals.     furosemide 40 MG tablet  Commonly known as:  LASIX  Take 1 tablet (40 mg total) by mouth every other day.     insulin aspart 100 UNIT/ML injection  Commonly known as:  novoLOG  Inject 0-15 Units into the skin 3 (three) times daily with meals.     insulin detemir 100 UNIT/ML injection  Commonly known as:  LEVEMIR  Inject 0.2 mLs (20 Units total) into the skin daily.     lactulose 10 GM/15ML solution  Commonly known as:  CHRONULAC  Take 30 mLs (20 g total) by mouth daily as needed for mild constipation.     losartan 25 MG tablet  Commonly known as:  COZAAR  Take 0.5 tablets (12.5 mg total) by mouth 2 (two) times daily.     oxyCODONE 5 MG immediate release tablet  Commonly known as:  Oxy IR/ROXICODONE  Take 1-2 tablets (5-10 mg total) by mouth every 4 (four) hours as needed for severe pain.     pantoprazole 40 MG tablet  Commonly known as:  PROTONIX  Take 1 tablet (40 mg  total) by mouth daily.     potassium chloride SA 20 MEQ tablet  Commonly known as:  K-DUR,KLOR-CON  Take 1 tablet (20 mEq total) by mouth every other day.     spironolactone 25 MG tablet  Commonly known as:  ALDACTONE  Take 1 tablet (25 mg total) by mouth daily.       Follow-up Information    Follow up with Mikey Bussing, MD.   Specialty:  Cardiothoracic Surgery   Why:  February 15 at 10:30 AM to see the surgeon. Please obtain a chest x-ray at 10 AM at [facility] imaging which is located in the same office complex.   Contact information:   [address] [place]  [address] [phone]       Follow up with Chrystie Nose, MD.   Specialty:  Cardiology   Contact information:   [address] [place] [address] [place] [address] [phone]      1. Please obtain vital signs at least one time daily 2.Please weigh the patient daily. If he or she continues to gain weight or develops lower extremity edema, contact the office at [phone]. 3. Ambulate patient at least three times daily and please use sternal precautions.   The patient has been discharged on:   1.Beta Blocker:  Yes [   ]                              No   [ n  ]                              If No, reason:bradycardia  2.Ace Inhibitor/ARB: Yes [ y  ]                                     No  [    ]                                     If No, reason:  3.Statin:   Yes Cove.Etienne   ]                  No  [   ]                  If No, reason:  4.Ecasa:  Yes  Cove.Etienne   ]                  No   [   ]                  If No, reason:  Signed: GOLD,WAYNE E 02/15/2015, 7:55 AM

## 2015-02-14 NOTE — Progress Notes (Signed)
Patient ID: Bradley Benitez, male   DOB: 1950-06-18, 65 y.o.   MRN: [id number]   65 yo with history of DM and smoking but has not had medical care in decades presented with acute systolic CHF.  Echo showed EF 25-30% with apical thrombus and LHC showed 3VD.  Underwent CABG 1/6. On 1/10 had VF arrest with defib x 9.   Yesterday amio cut back to 200 mg daily. Weight down another 3 pounds. Ambulated 150 feet with PT.   Denies SOB. Mild dizziness.   Scheduled Meds: . amiodarone  200 mg Oral Daily  . aspirin EC  325 mg Oral Daily  . atorvastatin  80 mg Oral q1800  . bisacodyl  10 mg Oral Daily   Or  . bisacodyl  10 mg Rectal Daily  . chlorhexidine   Topical Daily  . digoxin  0.125 mg Oral Daily  . docusate sodium  200 mg Oral Daily  . enoxaparin (LOVENOX) injection  50 mg Subcutaneous Q12H  . feeding supplement (GLUCERNA SHAKE)  237 mL Oral TID BM  . furosemide  40 mg Oral Daily  . insulin aspart  0-15 Units Subcutaneous TID WC  . insulin detemir  20 Units Subcutaneous Daily  . levalbuterol  0.63 mg Nebulization TID  . losartan  25 mg Oral BID  . pantoprazole  40 mg Oral Daily  . potassium chloride  20 mEq Oral Daily  . sodium chloride  3 mL Intravenous Q12H  . spironolactone  25 mg Oral Daily  . sucralfate  1 g Oral TID WC & HS   Continuous Infusions: . sodium chloride 250 mL (02/05/15 0730)  . sodium chloride 20 mL/hr at 02/09/15 1154   PRN Meds:.acetaminophen, alum & mag hydroxide-simeth, Gerhardt's butt cream, lactulose, ondansetron (ZOFRAN) IV, oxyCODONE, sodium chloride, traMADol    Filed Vitals:   02/13/15 1613 02/13/15 2126 02/13/15 2129 02/14/15 0655  BP: 106/60 106/55  98/52  Pulse: 59 72  70  Temp: 97.7 F (36.5 C) 97.8 F (36.6 C)  97.4 F (36.3 C)  TempSrc: Oral Oral  Oral  Resp: Height:      Weight:    213 lb 1.6 oz (96.662 kg)  SpO2: 100% 8% 98%     Intake/Output Summary (Last 24 hours) at 02/14/15 0725 Last data filed at 02/14/15 0437  Gross  per 24 hour  Intake    480 ml  Output   1525 ml  Net  -1045 ml    LABS: Basic Metabolic Panel:  Recent Labs  91/47/82 1030 02/14/15 0438  NA 134* 137  K 4.1 4.0  CL 99* 101  CO2 30 29  GLUCOSE 140* 111*  BUN 16 17  CREATININE 1.39* 1.40*  CALCIUM 8.7* 8.7*   Liver Function Tests: No results for input(s): AST, ALT, ALKPHOS, BILITOT, PROT, ALBUMIN in the last 72 hours. No results for input(s): LIPASE, AMYLASE in the last 72 hours. CBC:  Recent Labs  02/13/15 1030  WBC 10.0  HGB 10.1*  HCT 30.8*  MCV 84.2  PLT 280   Cardiac Enzymes: No results for input(s): CKTOTAL, CKMB, CKMBINDEX, TROPONINI in the last 72 hours. BNP: Invalid input(s): POCBNP D-Dimer: No results for input(s): DDIMER in the last 72 hours. Hemoglobin A1C: No results for input(s): HGBA1C in the last 72 hours. Fasting Lipid Panel: No results for input(s): CHOL, HDL, LDLCALC, TRIG, CHOLHDL, LDLDIRECT in the last 72 hours. Thyroid Function Tests: No results for input(s): TSH, T4TOTAL, T3FREE,  THYROIDAB in the last 72 hours.  Invalid input(s): FREET3 Anemia Panel: No results for input(s): VITAMINB12, FOLATE, FERRITIN, TIBC, IRON, RETICCTPCT in the last 72 hours.  RADIOLOGY: Dg Chest 2 View  02/10/2015  CLINICAL DATA:  Acute respiratory failure with hypoxemia. Congestive heart failure. Poorly controlled diabetes with diabetic neuropathy. EXAM: CHEST  2 VIEW COMPARISON:  02/09/2015 FINDINGS: Right arm PICC line in right subclavian central venous catheter remain in appropriate position. Heart size is normal. Prior CABG again noted. Bibasilar atelectasis or infiltrates show no significant change. Probable tiny bilateral pleural effusions again noted. IMPRESSION: No significant change in bibasilar atelectasis or infiltrates and probable tiny bilateral pleural effusions. Electronically Signed   By: Myles Rosenthal M.D.   On: 02/10/2015 08:57   Dg Chest 2 View  02/05/2015  CLINICAL DATA:  Shortness of breath,  status post CABG 4 days ago. EXAM: CHEST  2 VIEW COMPARISON:  PA and lateral chest x-ray of February 04, 2015 FINDINGS: The left lung is well-expanded. There is minimal basilar atelectasis medially. On the right a small amount of pleural fluid remains. The cardiac silhouette is mildly enlarged. The pulmonary vascularity is not engorged. There is no pneumothorax or pneumomediastinum. There are 7 intact sternal wires. The right-sided PICC line tip projects over the junction of the distal SVC with the right atrium. IMPRESSION: Further interval improvement in pulmonary interstitial edema. Persistent left basilar subsegmental atelectasis and persistent trace right pleural effusion. Electronically Signed   By: Dave  [place] M.D.   On: 02/05/2015 07:53   Dg Chest 2 View  02/04/2015  CLINICAL DATA:  Acute respiratory failure, CHF, previous MI, former smoker. EXAM: CHEST  2 VIEW COMPARISON:  Portable chest x-ray of February 03, 2015 FINDINGS: The lungs are borderline hypoinflated. There is a trace of pleural fluid on the right. Right basilar atelectasis or pneumonia has improved. There remains retrocardiac subsegmental atelectasis but it has improved as well. The cardiac silhouette remains enlarged. The pulmonary vascularity is less engorged. There are post CABG changes. The right-sided PICC line tip projects over the distal third of the SVC. The right internal jugular Cordis sheath has been removed. IMPRESSION: Mild interval improvement in bibasilar atelectasis or pneumonia. Trace right pleural effusion remains. Stable mild cardiomegaly with decreased pulmonary vascular congestion. Electronically Signed   By: Kyce  [place] M.D.   On: 02/04/2015 08:04   Dg Chest 2 View  01/26/2015  CLINICAL DATA:  Shortness of breath this AM; h/o diabetes; former smoker EXAM: CHEST  2 VIEW COMPARISON:  01/25/2015 FINDINGS: Heart size is normal. There is prominence of interstitial markings, increased. Perihilar peribronchial thickening and  noted. There are no focal consolidations. There are small bilateral pleural effusions. IMPRESSION: 1. Increased bronchitic changes. 2. Increased bilateral pleural effusions. Electronically Signed   By: Norva Pavlov M.D.   On: 01/26/2015 10:34   Dg Chest Port 1 View  02/09/2015  CLINICAL DATA:  Short of breath EXAM: PORTABLE CHEST 1 VIEW COMPARISON:  02/07/2015 FINDINGS: Normal heart size. Lungs under aerated with bibasilar hypoaeration. Small pleural effusions may be present. No pneumothorax. Right subclavian central venous catheter tip at the cavoatrial junction. IMPRESSION: Bibasilar atelectasis Small pleural effusions may be present. Electronically Signed   By: Jolaine Click M.D.   On: 02/09/2015 10:32   Dg Chest Port 1 View  02/07/2015  CLINICAL DATA:  CABG EXAM: PORTABLE CHEST 1 VIEW COMPARISON:  02/06/2015 FINDINGS: Right PICC and right subclavian central venous catheter are stable. Normal heart size. Low volumes. Bibasilar  atelectasis. Vascular congestion has resolved. Small pleural effusions are suspected. IMPRESSION: Resolved vascular congestion. Bibasilar atelectasis. Small pleural effusions persist. Electronically Signed   By: Jolaine Click M.D.   On: 02/07/2015 07:36   Dg Chest Port 1 View  02/06/2015  CLINICAL DATA:  CABG. EXAM: PORTABLE CHEST 1 VIEW COMPARISON:  02/05/2015. FINDINGS: Right PICC line in stable position. Prior CABG. Cardiomegaly with pulmonary venous congestion and interstitial prominence, no change from prior exam. Findings consistent with stable changes of congestive heart failure. Small bilateral pleural effusions. No pneumothorax. IMPRESSION: 1. Right PICC line in stable position. 2. Prior CABG. Persistent changes of congestive heart failure with mild pulmonary interstitial edema and small pleural effusions. Electronically Signed   By: Maisie Fus  Register   On: 02/06/2015 07:20   Dg Chest Port 1 View  02/05/2015  CLINICAL DATA:  Code. EXAM: PORTABLE CHEST 1 VIEW  COMPARISON:  Earlier today at 627 hours.  One day prior. FINDINGS: 741 hours. Extensive artifact degradation due to overlying support apparatus, external pacer. A right sided PICC line terminates at the low SVC. Patient rotated left. Prior median sternotomy. Cardiomegaly accentuated by AP portable technique. Left costophrenic angle excluded. Probable layering right pleural effusion. No pneumothorax. Low lung volumes, accentuating interstitial edema. Developing right base airspace disease. IMPRESSION: Developing congestive heart failure. Probable small right pleural effusion with adjacent right base atelectasis. Extensive artifact degradation. Electronically Signed   By: Jeronimo Greaves M.D.   On: 02/05/2015 07:50   Dg Chest Port 1 View  02/03/2015  CLINICAL DATA:  Atelectasis EXAM: PORTABLE CHEST 1 VIEW COMPARISON:  Chest x-rays dated 02/02/2015 and 02/01/2015. FINDINGS: Left-sided chest tube is no longer seen. Swan-Ganz catheter is also no longer seen. Mediastinal drains have also been removed. Right IJ central line access site remains with tip projected over the upper SVC. Right-sided PICC line appears well positioned with tip projected over the lower SVC. Cardiomediastinal silhouette is stable in size and configuration. Cardiomegaly is stable. Median sternotomy wires are intact and stable in alignment, presumably for CABG. There is mild central pulmonary vascular congestion. Ill-defined bibasilar airspace opacities are not not significantly changed, presumably atelectasis and/or small effusions. No new lung findings. No pneumothorax seen. IMPRESSION: 1. Multiple tubes and lines removed, as above. 2. Persistent mild central pulmonary vascular congestion without evidence of overt pulmonary edema. 3. Persistent ill-defined bibasilar airspace opacities, presumably atelectasis and/or small effusions. 4. No new lung findings. 5. Stable cardiomegaly. Electronically Signed   By: Bary Richard M.D.   On: 02/03/2015 08:02     Dg Chest Port 1 View  02/02/2015  CLINICAL DATA:  65 year old male status post 4 vessel CABG EXAM: PORTABLE CHEST 1 VIEW COMPARISON:  Prior chest x-ray 02/01/2015 FINDINGS: The patient has been extubated and the nasogastric tube removed. A right IJ vascular sheath convey is a Swan-Ganz catheter into the heart. The tip of the Swan overlies the proximal right lower lobe pulmonary artery. A right upper extremity PICC is again noted in good position with the tip overlying the upper right atrium. Mediastinal and left thoracic drains are present via a subxiphoid approach. Patient is status post median sternotomy with evidence of multivessel CABG. Stable cardiomegaly. Mediastinal contours are grossly unchanged given lower inspiratory volumes. Lower inspiratory volumes with increased bibasilar opacities favored to reflect atelectasis. Pulmonary vascular congestion without overt edema. No evidence of pneumothorax or large effusion. No acute osseous abnormality. IMPRESSION: 1. Interval extubation and removal of nasogastric tube. 2. Lower inspiratory volumes with increasing bibasilar atelectasis.  3. Pulmonary vascular congestion without overt edema. 4. Other support apparatus in stable and satisfactory position as above. Electronically Signed   By: Malachy Moan M.D.   On: 02/02/2015 08:54   Dg Chest Port 1 View  02/01/2015  CLINICAL DATA:  Patient status post CABG procedure. EXAM: PORTABLE CHEST 1 VIEW COMPARISON:  Chest radiograph 01/26/2015. FINDINGS: ET tube terminates in the mid trachea. Enteric tube courses inferior to the diaphragm. Right IJ approach pulmonary arterial catheter is present with tip projecting over the expected location of the right main pulmonary artery. Multiple mediastinal drains are present. Right upper extremity PICC line is present with tip projecting at the superior cavoatrial junction. Left chest tube in place. Stable cardiac and mediastinal contours status post median sternotomy and  CABG procedure. No large area of pulmonary consolidation. No definite pleural effusion or pneumothorax. No aggressive or acute appearing osseous lesions. IMPRESSION: Support apparatus as above. Electronically Signed   By: Annia Belt M.D.   On: 02/01/2015 16:04   Dg Chest Port 1 View  01/25/2015  CLINICAL DATA:  Acute respiratory failure with hypoxemia.  Fever. EXAM: PORTABLE CHEST 1 VIEW COMPARISON:  01/23/2015. FINDINGS: Grossly stable enlarged cardiac silhouette. Prominent pulmonary vasculature and interstitial markings with improvement. Interval small amount of linear density in the right mid lung zone. Minimal right pleural fluid. Central peribronchial thickening. Unremarkable bones. IMPRESSION: 1. Improving changes of congestive heart failure. 2. Small amount of linear atelectasis in the right mid lung zone. 3. Moderate bronchitic changes. Electronically Signed   By: Beckie Salts M.D.   On: 01/25/2015 16:34   Dg Chest Port 1 View  01/23/2015  CLINICAL DATA:  Congestive heart failure EXAM: PORTABLE CHEST 1 VIEW COMPARISON:  None. FINDINGS: There is generalized interstitial edema with patchy alveolar edema in the bases. Heart is enlarged with pulmonary venous hypertension. No adenopathy. IMPRESSION: Evidence of congestive heart failure with edema most pronounced in the lung bases. Electronically Signed   By: Bretta Bang III M.D.   On: 01/23/2015 09:34   Mr Card Morphology Wo/w Cm  01/29/2015  CLINICAL DATA:  CAD, assess for viability EXAM: CARDIAC MRI TECHNIQUE: The patient was scanned on a 1.5 Tesla GE magnet. A dedicated cardiac coil was used. Functional imaging was done using Fiesta sequences. 2,3, and 4 chamber views were done to assess for RWMA's. Modified Simpson's rule using a short axis stack was used to calculate an ejection fraction on a dedicated work [employer]. The patient received 34 cc of Multihance. After 10 minutes inversion recovery sequences were used to  assess for infiltration and scar tissue. CONTRAST:  34 cc Multihance FINDINGS: There were bilateral small to moderate pleural effusions. There was a trivial pericardial effusion. Normal left ventricular size. EF 26% with mid anteroseptal and mid anterior akinesis. The mid anterolateral wall was hypokinetic. The apical septal, inferior, and anterior segments were akinetic and the apical lateral segment was hypokinetic. The true apex was akinetic. A small apical thrombus was noted. The atria were normal in size. The right ventricle was normal in size and systolic function. The aortic valve was trileaflet with no stenosis or significant regurgitation. There did not appear to be significant mitral regurgitation. On delayed enhancement imaging, there was subendocardial 76-99% wall thickness late gadolinium enhancement (LGE) in the mid anteroseptal and mid anterior wall segments, in the apical septal/anterior/inferior wall segments, and in the true apex. Basal segments without LGE, anterolateral wall without significant LGE, and mid inferior wall without LGE.  MEASUREMENTS: MEASUREMENTS LV EDV 212 mL LV SV 54 mL LV EF 26% IMPRESSION: 1. Severe LV systolic dysfunction, EF 26%. Wall motion abnormalities as above. 2.  Normal RV size and systolic function . 3. LGE pattern suggestive of infarction in LAD territory. The mid anterior/anteroseptal and apical anterior/septal/inferior wall segments and the true apex do not appear viable (probably not likely to improve function with revascularization). The remainder of the left ventricle appears viable. 4.  There is a small LV apical thrombus. Bradley Benitez Electronically Signed   By: [name] M.D.   On: 01/29/2015 09:10    PHYSICAL EXAM General: Elderly appearing, NAD . In bed   Neck: JVP 5-6 cm, no thyromegaly or lymphadenopathy noted  Lungs: Decreased bases. CV: Nondisplaced PMI.  Heart regular S1/S2, no S3/S4, no murmur. Ecchymotic/ edema around SVG harvest site on  R. . No carotid bruit.  Normal pedal pulses.  Abdomen: Soft, non-tender, non-distended, no HSM. No bruits or masses. +BS  Neurologic: Alert and oriented x 3.  Psych: Normal affect. Extremities: No clubbing or cyanosis. RLE ecchymotic. LLE no edema.  RLE tender  TELEMETRY: SR 70s  ASSESSMENT AND PLAN: 65 yo with history of DM and smoking but has not had medical care in decades presented with acute systolic CHF.  Echo showed EF 25-30% with apical thrombus and LHC showed 3VD. 1. Ventricular fibrillation arrest: ; suspect scar-mediated - No further VT on po amio. Continue amio 200 mg daily.  - Intrinsic rate changed to 60s yesterday.  2. Acute systolic CHF: EF 57% with regional WMAs, ischemic cardiomyopathy.  Now s/p CABG x 4.   - Milrinone stopped 02/09/15. Todays CO-OX is 57%.  - Cut back Losartan to 12.5 mg bid. No b-blocker yet Volume status low. Change lasix to 40 mg every other day.   -- Continue spironolactone 25 mg daily.  -Renal function stable.  3. CAD: Diffuse 3 vessel disease, see cath report above. MRI reviewed: LAD territory did not appear to have significant viability.  Remainder of LV myocardium did appear viable.  He had severe disease in LCx and RCA territory that may benefit from revascularization but suspect LAD territory will not recover.   - s/p CABG x 4 on 1/6.  Suspect ventricular fibrillation arrest 1/10 was scar-mediated as opposed to occlusion of graft, etc.  - Continue ASA, atorvastatin 80 daily.  4. LV mural thrombus: Seen on 1/10 echo, mobile thrombus.  Now on subQ lovenox. - Well need transition to NOAC prior to discharge once pacing wires out.  5. Diabetes:  -  Continue insulin.  6. Foot lesions:  - WOC consult appreciated.  - Continue dry dressing per WOC 7. Diabetic neuropathy:  - Continue gabapentin 300 mg bid.  8. R foot/calf pain and increased swelling compared to L side - VAS [place] with NO DVT. Now on cephalexin for possible infection.  9.  Deconditioning:  - Continue to mobilize as able. PT following with recommendations for SNF.    Amy Clegg NP-C  02/14/2015 7:25 AM   Advanced Heart Failure Team Pager [phone] (M-F; 7a - 4p)   Patient seen with NP, agree with the above note.  Stable today, walked 410 feet with rehab (improved).  No dyspnea but still some lightheadedness.  BP remains soft.  - Decrease losartan to 12.5 mg bid. - Decrease Lasix to every other day => volume status looks ok.   Pacing wires removed today, he is in NSR in the 70s.    Eliquis  begun and Lovenox stopped.  Drop ASA back to 81 mg daily.  Will eventually need to increase to full dose Eliquis 5 mg bid.   [name] 02/14/2015 5:12 PM

## 2015-02-14 NOTE — Progress Notes (Signed)
02/14/2015 8:54 AM EPW D/C'd per order and per protocol.  Ends intact. Pt. Tolerated well.  Advised bedrest x1hr.  Call bell in reach.  Vital signs collected per protocol. Kathryne Hitch

## 2015-02-14 NOTE — Progress Notes (Signed)
Physical Therapy Treatment Patient Details Name: Bradley Benitez MRN: [id number] DOB: [birth year] Today's Date: 02/14/2015    History of Present Illness 65 y/o male with diabetes but currently does not go to the doctor or take any medications presented to the [facility] emergency department on 01/23/2015 complaining of shortness of breath. In the emergency department he was noted to be profoundly hypoxemic, requiring BiPAP for respiratory assistance, he had an elevated lactic acid. He was given antibiotics for presumed sepsis.Tentative plan for CABG 02/01/2015.  S/p CABG 1/7, 1/10  vfib episode with 8-9 shock to get pt into rhthym.    PT Comments    Attempts to get pt to do more fall flat each time.  Emphasis by default lies with progressive ambulation  Follow Up Recommendations  SNF     Equipment Recommendations  Other (comment)    Recommendations for Other Services       Precautions / Restrictions Precautions Precautions: Fall    Mobility  Bed Mobility Overal bed mobility: Needs Assistance Bed Mobility: Supine to Sit   Sidelying to sit: Min assist       General bed mobility comments: Discussion and practice on building momentum to come forward to sitting.  Transfers Overall transfer level: Needs assistance Equipment used: Rolling walker (2 wheeled)   Sit to Stand: Min assist         General transfer comment: cues for hand placement based on sternal precautions.  Ambulation/Gait Ambulation/Gait assistance: Supervision Ambulation Distance (Feet): 150 Feet Assistive device: Rolling walker (2 wheeled) Gait Pattern/deviations: Step-through pattern Gait velocity: slower   General Gait Details: head down, little scanning around when out in the halls.  Slow cadence with little motivation to push himself to do more.   Stairs            Wheelchair Mobility    Modified Rankin (Stroke Patients Only)       Balance     Sitting balance-Leahy Scale: Fair                               Cognition Arousal/Alertness: Awake/alert Behavior During Therapy: WFL for tasks assessed/performed;Anxious Overall Cognitive Status: Within Functional Limits for tasks assessed                      Exercises      General Comments        Pertinent Vitals/Pain Pain Assessment: Faces Faces Pain Scale: Hurts little more Pain Location: R LE Pain Descriptors / Indicators: Sore Pain Intervention(s): Monitored during session    Home Living                      Prior Function            PT Goals (current goals can now be found in the care plan section) Acute Rehab PT Goals Patient Stated Goal: To get home, breathe better PT Goal Formulation: With patient Time For Goal Achievement: 02/13/15 Potential to Achieve Goals: Good Progress towards PT goals: Progressing toward goals    Frequency  Min 3X/week    PT Plan Current plan remains appropriate    Co-evaluation             End of Session   Activity Tolerance: Patient tolerated treatment well Patient left: in chair;with call bell/phone within reach;with family/visitor present     Time: 1049-1110 PT Time Calculation (min) (ACUTE ONLY): 21 min  Charges:  $Gait Training: 8-22 mins                    G Codes:      Rise Traeger, Eliseo Gum 02/14/2015, 12:22 PM 02/14/2015  [place] Bing, PT [phone]  (pager)

## 2015-02-14 NOTE — Progress Notes (Signed)
Pt chooses [facility]- they anticipate they would have a bed for the pt tomorrow if he is medically stable  CSW will continue to follow  Merlyn Lot, [facility] Clinical Social Worker [phone]

## 2015-02-15 DIAGNOSIS — I5021 Acute systolic (congestive) heart failure: Secondary | ICD-10-CM | POA: Diagnosis not present

## 2015-02-15 DIAGNOSIS — I519 Heart disease, unspecified: Secondary | ICD-10-CM | POA: Diagnosis not present

## 2015-02-15 DIAGNOSIS — I214 Non-ST elevation (NSTEMI) myocardial infarction: Secondary | ICD-10-CM | POA: Diagnosis not present

## 2015-02-15 DIAGNOSIS — G4700 Insomnia, unspecified: Secondary | ICD-10-CM | POA: Diagnosis not present

## 2015-02-15 DIAGNOSIS — E118 Type 2 diabetes mellitus with unspecified complications: Secondary | ICD-10-CM | POA: Diagnosis not present

## 2015-02-15 DIAGNOSIS — I10 Essential (primary) hypertension: Secondary | ICD-10-CM | POA: Diagnosis not present

## 2015-02-15 DIAGNOSIS — I5030 Unspecified diastolic (congestive) heart failure: Secondary | ICD-10-CM | POA: Diagnosis not present

## 2015-02-15 DIAGNOSIS — E08311 Diabetes mellitus due to underlying condition with unspecified diabetic retinopathy with macular edema: Secondary | ICD-10-CM | POA: Diagnosis not present

## 2015-02-15 DIAGNOSIS — L0390 Cellulitis, unspecified: Secondary | ICD-10-CM | POA: Diagnosis not present

## 2015-02-15 DIAGNOSIS — G8912 Acute post-thoracotomy pain: Secondary | ICD-10-CM | POA: Diagnosis not present

## 2015-02-15 DIAGNOSIS — I110 Hypertensive heart disease with heart failure: Secondary | ICD-10-CM | POA: Diagnosis not present

## 2015-02-15 DIAGNOSIS — I213 ST elevation (STEMI) myocardial infarction of unspecified site: Secondary | ICD-10-CM | POA: Diagnosis not present

## 2015-02-15 DIAGNOSIS — D62 Acute posthemorrhagic anemia: Secondary | ICD-10-CM | POA: Diagnosis not present

## 2015-02-15 DIAGNOSIS — E785 Hyperlipidemia, unspecified: Secondary | ICD-10-CM | POA: Diagnosis not present

## 2015-02-15 DIAGNOSIS — R278 Other lack of coordination: Secondary | ICD-10-CM | POA: Diagnosis not present

## 2015-02-15 DIAGNOSIS — M6281 Muscle weakness (generalized): Secondary | ICD-10-CM | POA: Diagnosis not present

## 2015-02-15 DIAGNOSIS — I5020 Unspecified systolic (congestive) heart failure: Secondary | ICD-10-CM | POA: Diagnosis not present

## 2015-02-15 DIAGNOSIS — K5900 Constipation, unspecified: Secondary | ICD-10-CM | POA: Diagnosis not present

## 2015-02-15 DIAGNOSIS — R2681 Unsteadiness on feet: Secondary | ICD-10-CM | POA: Diagnosis not present

## 2015-02-15 DIAGNOSIS — Z951 Presence of aortocoronary bypass graft: Secondary | ICD-10-CM | POA: Diagnosis not present

## 2015-02-15 DIAGNOSIS — R5381 Other malaise: Secondary | ICD-10-CM | POA: Diagnosis not present

## 2015-02-15 DIAGNOSIS — Z48812 Encounter for surgical aftercare following surgery on the circulatory system: Secondary | ICD-10-CM | POA: Diagnosis not present

## 2015-02-15 DIAGNOSIS — E46 Unspecified protein-calorie malnutrition: Secondary | ICD-10-CM | POA: Diagnosis not present

## 2015-02-15 DIAGNOSIS — I4891 Unspecified atrial fibrillation: Secondary | ICD-10-CM | POA: Diagnosis not present

## 2015-02-15 LAB — GLUCOSE, CAPILLARY
GLUCOSE-CAPILLARY: 120 mg/dL — AB (ref 65–99)
Glucose-Capillary: 122 mg/dL — ABNORMAL HIGH (ref 65–99)

## 2015-02-15 LAB — CBC
HEMATOCRIT: 30.1 % — AB (ref 39.0–52.0)
HEMOGLOBIN: 9.6 g/dL — AB (ref 13.0–17.0)
MCH: 27.3 pg (ref 26.0–34.0)
MCHC: 31.9 g/dL (ref 30.0–36.0)
MCV: 85.5 fL (ref 78.0–100.0)
Platelets: 284 10*3/uL (ref 150–400)
RBC: 3.52 MIL/uL — AB (ref 4.22–5.81)
RDW: 14.2 % (ref 11.5–15.5)
WBC: 9.2 10*3/uL (ref 4.0–10.5)

## 2015-02-15 LAB — BASIC METABOLIC PANEL
ANION GAP: 6 (ref 5–15)
BUN: 18 mg/dL (ref 6–20)
CALCIUM: 8.3 mg/dL — AB (ref 8.9–10.3)
CHLORIDE: 104 mmol/L (ref 101–111)
CO2: 27 mmol/L (ref 22–32)
Creatinine, Ser: 1.31 mg/dL — ABNORMAL HIGH (ref 0.61–1.24)
GFR calc non Af Amer: 55 mL/min — ABNORMAL LOW (ref >60)
Glucose, Bld: 126 mg/dL — ABNORMAL HIGH (ref 65–99)
POTASSIUM: 3.9 mmol/L (ref 3.5–5.1)
Sodium: 137 mmol/L (ref 135–145)

## 2015-02-15 MED ORDER — CEPHALEXIN 250 MG PO CAPS: 250.0000 mg | ORAL_CAPSULE | Freq: Three times a day (TID) | ORAL | Status: AC

## 2015-02-15 MED ORDER — LACTULOSE 10 GM/15ML PO SOLN: 20.0000 g | Freq: Every day | ORAL | Status: DC | PRN

## 2015-02-15 MED ORDER — LOSARTAN POTASSIUM 25 MG PO TABS: 12.5000 mg | ORAL_TABLET | Freq: Two times a day (BID) | ORAL | Status: DC

## 2015-02-15 MED ORDER — DOCUSATE SODIUM 100 MG PO CAPS: 200.0000 mg | ORAL_CAPSULE | Freq: Every day | ORAL | Status: DC

## 2015-02-15 MED ORDER — CEPHALEXIN 250 MG PO CAPS
250.0000 mg | ORAL_CAPSULE | Freq: Three times a day (TID) | ORAL | Status: DC
  Filled 2015-02-15 (×4): qty 1

## 2015-02-15 MED ORDER — SPIRONOLACTONE 25 MG PO TABS: 25.0000 mg | ORAL_TABLET | Freq: Every day | ORAL | Status: DC

## 2015-02-15 MED ORDER — OXYCODONE HCL 5 MG PO TABS: 5.0000 mg | ORAL_TABLET | ORAL | Status: DC | PRN

## 2015-02-15 MED ORDER — ATORVASTATIN CALCIUM 80 MG PO TABS: 80.0000 mg | ORAL_TABLET | Freq: Every day | ORAL | Status: DC

## 2015-02-15 MED ORDER — AMIODARONE HCL 200 MG PO TABS: 200.0000 mg | ORAL_TABLET | Freq: Every day | ORAL | Status: DC

## 2015-02-15 MED ORDER — APIXABAN 2.5 MG PO TABS: 2.5000 mg | ORAL_TABLET | Freq: Two times a day (BID) | ORAL | Status: DC

## 2015-02-15 MED ORDER — INSULIN DETEMIR 100 UNIT/ML ~~LOC~~ SOLN: 20.0000 [IU] | Freq: Every day | SUBCUTANEOUS | Status: AC

## 2015-02-15 MED ORDER — POTASSIUM CHLORIDE CRYS ER 20 MEQ PO TBCR: 20.0000 meq | EXTENDED_RELEASE_TABLET | ORAL | Status: DC

## 2015-02-15 MED ORDER — INSULIN ASPART 100 UNIT/ML ~~LOC~~ SOLN: 0.0000 [IU] | Freq: Three times a day (TID) | SUBCUTANEOUS | Status: DC

## 2015-02-15 MED ORDER — GLUCERNA SHAKE PO LIQD: 237.0000 mL | Freq: Three times a day (TID) | ORAL | Status: DC

## 2015-02-15 MED ORDER — PANTOPRAZOLE SODIUM 40 MG PO TBEC: 40.0000 mg | DELAYED_RELEASE_TABLET | Freq: Every day | ORAL | Status: DC

## 2015-02-15 MED ORDER — FUROSEMIDE 40 MG PO TABS: 40.0000 mg | ORAL_TABLET | ORAL | Status: DC

## 2015-02-15 MED ORDER — DIGOXIN 125 MCG PO TABS: 0.1250 mg | ORAL_TABLET | Freq: Every day | ORAL | Status: DC

## 2015-02-15 MED ORDER — ASPIRIN 81 MG PO CHEW: 81.0000 mg | CHEWABLE_TABLET | Freq: Every day | ORAL | Status: DC

## 2015-02-15 NOTE — Care Management Note (Signed)
Case Management Note Previous CM note initiated by [facility] RN, CM  Patient Details  Name: Bradley Benitez MRN: [id number] Date of Birth: [birth year]  Subjective/Objective:   With pt's current healthcare plan, Eliquis and Xarelto are Tier 3 and pt would be responsible for 33% of the cost - pt's income is very limited and he could not afford.  Multiple telephone calls to pt's healthcare plan and pharmaceutical company yielded "nothing we can do to change that" despite explanation of pt's situation.  Discussed situation with [employer] rep, Rosana Hoes who said pt can enroll in Patient Assistance Program even though he has Medicare.  Application taken to pt, sister-in-law, Jose Corvin [phone]) will complete application and include pt's social security statement of income for CM to fax to [place].  Provided card for free 30-day supply of Eliquis.                          Subjective/Objective:                  sister from [place] [phone])   Action/Plan: CSW following for STSNF placement  Expected Discharge Date:                  Expected Discharge Plan:  [place]  In-House Referral:  Clinical Social Work  Discharge planning Services  CM Consult, Medication Assistance  Post Acute Care Choice:    Choice offered to:     DME Arranged:    DME Agency:     HH Arranged:    [place] Agency:     Status of Service:  Completed, signed off  Medicare Important Message Given:  Yes Date Medicare IM Given:    Medicare IM give by:    Date Additional Medicare IM Given:    Additional Medicare Important Message give by:     If discussed at [place] of Stay Meetings, dates discussed:  02/11/15, 02/14/15  Additional Comments:  02/15/15- Marvetta Gibbons RN, BSN - pt for d/c to SNF today- CSW aware and following for placement needs- f/u done with pt regarding Eliquis pt assistance forms- form given back to pt and he is going to take it  with him to his f/u with Dr. Aundra Dubin on Jan. 31- instructed pt to have Dr. Aundra Dubin fill out at this appointment then have MD office fax it for him- also told pt that MD office usually has samples if he needs them to just ask if he has not heard back from application and he is about to run out of his 30 day free supply. Pt also asked about Medicaid application- application printed off of intranet and given to pt along with advance directive packet per pt request. No further CM needs - pt waiting for transport to SNF.  02/13/15- 1015- Marvetta Gibbons RN, BSN - met with pt and sister at bedside regarding Eliquis needs for discharge- pt had been given both 30 day free and copay assist cards along with pt assistance application due to his copay being high and his limited income- pt has filled out his part of application and now need MD to fill out physician part- have placed application on shadow chart for MD to fill out-and left sticky note- pt also has SS info on income- have made copy of this to send along with application. Pt and sister both understand that application has to be processed before pt will know if he  has been approved for further assistance past his 30 day free. Pt is looking into STSNF and has list of offers- will f/u with CSW once he has chosen bed. CM will f/u on Eliquis application prior to discharge.   Dawayne Patricia, RN 02/15/2015, 1:44 PM

## 2015-02-15 NOTE — Progress Notes (Signed)
02/15/2015 1605 Pt discharged to [facility] via [facility]. Kathryne Hitch

## 2015-02-15 NOTE — Progress Notes (Signed)
Ed completed with pt and sister in law. Pt with many appropriate questions. Practiced getting in and out of bed appropriately as he was nervous about this. Long discussion of DM and low sodium diet. Discussed daily wts and calling MD. Will refer to [place] CRPII. Gave walking guidelines.  [id number] Ethelda Chick CES, ACSM 11:32 AM 02/15/2015

## 2015-02-15 NOTE — Progress Notes (Addendum)
[address]       [place] [address]             [phone]      14 Days Post-Op Procedure(s) (LRB): CORONARY ARTERY BYPASS GRAFTING (CABG)x 4 using left internal mammary artery and right greater saphenous leg vein using endoscope. (N/A) TRANSESOPHAGEAL ECHOCARDIOGRAM (TEE) (N/A)  CENTRIMAG VENTRICULAR ASSIST DEVICE, back up (N/A) Subjective: Rubbed is EVH site accidentally and had some bleeding last pm, stopped currently. Dizziness is improved  Objective: Vital signs in last 24 hours: Temp:  [97.4 F (36.3 C)-97.8 F (36.6 C)] 97.4 F (36.3 C) (01/20 0257) Pulse Rate:  [71-77] 71 (01/20 0257) Cardiac Rhythm:  [-] Normal sinus rhythm (01/19 1900) Resp:  [18-20] 20 (01/20 0257) BP: (85-142)/(57-80) 91/63 mmHg (01/20 0257) SpO2:  [95 %-99 %] 95 % (01/20 0257) Weight:  [208 lb 9.6 oz (94.62 kg)] 208 lb 9.6 oz (94.62 kg) (01/20 0257)  Hemodynamic parameters for last 24 hours:    Intake/Output from previous day: 01/19 0701 - 01/20 0700 In: 720 [P.O.:720] Out: 325 [Urine:325] Intake/Output this shift:    General appearance: alert, cooperative and no distress Heart: regular rate and rhythm Lungs: mildly dim in bases Abdomen: benign Extremities: minor edema Wound: echymosis to RLE is improved  Lab Results:  Recent Labs  02/13/15 1030 02/15/15 0445  WBC 10.0 9.2  HGB 10.1* 9.6*  HCT 30.8* 30.1*  PLT 280 284   BMET:  Recent Labs  02/14/15 0438 02/15/15 0445  NA 137 137  K 4.0 3.9  CL 101 104  CO2 29 27  GLUCOSE 111* 126*  BUN 17 18  CREATININE 1.40* 1.31*  CALCIUM 8.7* 8.3*    PT/INR: No results for input(s): LABPROT, INR in the last 72 hours. ABG    Component Value Date/Time   PHART 7.449 02/06/2015 0647   HCO3 22.5 02/06/2015 0647   TCO2 23 02/08/2015 1714   ACIDBASEDEF 1.0 02/06/2015 0647   O2SAT 57.3 02/14/2015 0440   CBG (last 3)   Recent Labs  02/14/15 1610 02/14/15 2128 02/15/15 0629  GLUCAP 163* 167* 120*     Meds Scheduled Meds: . amiodarone  200 mg Oral Daily  . apixaban  2.5 mg Oral BID  . aspirin  81 mg Oral Daily  . atorvastatin  80 mg Oral q1800  . bisacodyl  10 mg Oral Daily   Or  . bisacodyl  10 mg Rectal Daily  . chlorhexidine   Topical Daily  . digoxin  0.125 mg Oral Daily  . docusate sodium  200 mg Oral Daily  . feeding supplement (GLUCERNA SHAKE)  237 mL Oral TID BM  . furosemide  40 mg Oral QODAY  . insulin aspart  0-15 Units Subcutaneous TID WC  . insulin detemir  20 Units Subcutaneous Daily  . levalbuterol  0.63 mg Nebulization TID  . losartan  12.5 mg Oral BID  . pantoprazole  40 mg Oral Daily  . potassium chloride  20 mEq Oral Daily  . sodium chloride  3 mL Intravenous Q12H  . spironolactone  25 mg Oral Daily  . sucralfate  1 g Oral TID WC & HS   Continuous Infusions: . sodium chloride 250 mL (02/05/15 0730)  . sodium chloride 20 mL/hr at 02/09/15 1154   PRN Meds:.acetaminophen, alum & mag hydroxide-simeth, Gerhardt's butt cream, lactulose, ondansetron (ZOFRAN) IV, oxyCODONE, sodium chloride, traMADol  Xrays No results found.  Assessment/Plan: S/P Procedure(s) (LRB): CORONARY ARTERY BYPASS  GRAFTING (CABG)x 4 using left internal mammary artery and right greater saphenous leg vein using endoscope. (N/A) TRANSESOPHAGEAL ECHOCARDIOGRAM (TEE) (N/A)  CENTRIMAG VENTRICULAR ASSIST DEVICE, back up (N/A)  1 conts to progress 2 appears to be in sinus with occas missed beats 3 creat improved, H/H stable, no leukocytosis 4 sugars controlled pretty well 5 appears to be ready for SNF   LOS: 23 days    GOLD,WAYNE E 02/15/2015  Agree with plan for DC to SNF Cont po keflex for 7 more days patient examined and medical record reviewed,agree with above note. Kathlee Nations Trigt III 02/15/2015

## 2015-02-15 NOTE — Discharge Instructions (Signed)
Heart Failure °Heart failure is a condition in which the heart has trouble pumping blood. This means your heart does not pump blood efficiently for your body to work well. In some cases of heart failure, fluid may back up into your lungs or you may have swelling (edema) in your lower legs. Heart failure is usually a long-term (chronic) condition. It is important for you to take good care of yourself and follow your health care provider's treatment plan. °CAUSES  °Some health conditions can cause heart failure. Those health conditions include: °· High blood pressure (hypertension). Hypertension causes the heart muscle to work harder than normal. When pressure in the blood vessels is high, the heart needs to pump (contract) with more force in order to circulate blood throughout the body. High blood pressure eventually causes the heart to become stiff and weak. °· Coronary artery disease (CAD). CAD is the buildup of cholesterol and fat (plaque) in the arteries of the heart. The blockage in the arteries deprives the heart muscle of oxygen and blood. This can cause chest pain and may lead to a heart attack. High blood pressure can also contribute to CAD. °· Heart attack (myocardial infarction). A heart attack occurs when one or more arteries in the heart become blocked. The loss of oxygen damages the muscle tissue of the heart. When this happens, part of the heart muscle dies. The injured tissue does not contract as well and weakens the heart's ability to pump blood. °· Abnormal heart valves. When the heart valves do not open and close properly, it can cause heart failure. This makes the heart muscle pump harder to keep the blood flowing. °· Heart muscle disease (cardiomyopathy or myocarditis). Heart muscle disease is damage to the heart muscle from a variety of causes. These can include drug or alcohol abuse, infections, or unknown reasons. These can increase the risk of heart failure. °· Lung disease. Lung disease  makes the heart work harder because the lungs do not work properly. This can cause a strain on the heart, leading it to fail. °· Diabetes. Diabetes increases the risk of heart failure. High blood sugar contributes to high fat (lipid) levels in the blood. Diabetes can also cause slow damage to tiny blood vessels that carry important nutrients to the heart muscle. When the heart does not get enough oxygen and food, it can cause the heart to become weak and stiff. This leads to a heart that does not contract efficiently. °· Other conditions can contribute to heart failure. These include abnormal heart rhythms, thyroid problems, and low blood counts (anemia). °Certain unhealthy behaviors can increase the risk of heart failure, including: °· Being overweight. °· Smoking or chewing tobacco. °· Eating foods high in fat and cholesterol. °· Abusing illicit drugs or alcohol. °· Lacking physical activity. °SYMPTOMS  °Heart failure symptoms may vary and can be hard to detect. Symptoms may include: °· Shortness of breath with activity, such as climbing stairs. °· Persistent cough. °· Swelling of the feet, ankles, legs, or abdomen. °· Unexplained weight gain. °· Difficulty breathing when lying flat (orthopnea). °· Waking from sleep because of the need to sit up and get more air. °· Rapid heartbeat. °· Fatigue and loss of energy. °· Feeling light-headed, dizzy, or close to fainting. °· Loss of appetite. °· Nausea. °· Increased urination during the night (nocturia). °DIAGNOSIS  °A diagnosis of heart failure is based on your history, symptoms, physical examination, and diagnostic tests. Diagnostic tests for heart failure may include: °·   Echocardiography. °· Electrocardiography. °· Chest X-ray. °· Blood tests. °· Exercise stress test. °· Cardiac angiography. °· Radionuclide scans. °TREATMENT  °Treatment is aimed at managing the symptoms of heart failure. Medicines, behavioral changes, or surgical intervention may be necessary to  treat heart failure. °· Medicines to help treat heart failure may include: °¨ Angiotensin-converting enzyme (ACE) inhibitors. This type of medicine blocks the effects of a blood protein called angiotensin-converting enzyme. ACE inhibitors relax (dilate) the blood vessels and help lower blood pressure. °¨ Angiotensin receptor blockers (ARBs). This type of medicine blocks the actions of a blood protein called angiotensin. Angiotensin receptor blockers dilate the blood vessels and help lower blood pressure. °¨ Water pills (diuretics). Diuretics cause the kidneys to remove salt and water from the blood. The extra fluid is removed through urination. This loss of extra fluid lowers the volume of blood the heart pumps. °¨ Beta blockers. These prevent the heart from beating too fast and improve heart muscle strength. °¨ Digitalis. This increases the force of the heartbeat. °· Healthy behavior changes include: °¨ Obtaining and maintaining a healthy weight. °¨ Stopping smoking or chewing tobacco. °¨ Eating heart-healthy foods. °¨ Limiting or avoiding alcohol. °¨ Stopping illicit drug use. °¨ Physical activity as directed by your health care provider. °· Surgical treatment for heart failure may include: °¨ A procedure to open blocked arteries, repair damaged heart valves, or remove damaged heart muscle tissue. °¨ A pacemaker to improve heart muscle function and control certain abnormal heart rhythms. °¨ An internal cardioverter defibrillator to treat certain serious abnormal heart rhythms. °¨ A left ventricular assist device (LVAD) to assist the pumping ability of the heart. °HOME CARE INSTRUCTIONS  °· Take medicines only as directed by your health care provider. Medicines are important in reducing the workload of your heart, slowing the progression of heart failure, and improving your symptoms. °¨ Do not stop taking your medicine unless directed by your health care provider. °¨ Do not skip any dose of medicine. °¨ Refill your  prescriptions before you run out of medicine. Your medicines are needed every day. °· Engage in moderate physical activity if directed by your health care provider. Moderate physical activity can benefit some people. The elderly and people with severe heart failure should consult with a health care provider for physical activity recommendations. °· Eat heart-healthy foods. Food choices should be free of trans fat and low in saturated fat, cholesterol, and salt (sodium). Healthy choices include fresh or frozen fruits and vegetables, fish, lean meats, legumes, fat-free or low-fat dairy products, and whole grain or high fiber foods. Talk to a dietitian to learn more about heart-healthy foods. °· Limit sodium if directed by your health care provider. Sodium restriction may reduce symptoms of heart failure in some people. Talk to a dietitian to learn more about heart-healthy seasonings. °· Use healthy cooking methods. Healthy cooking methods include roasting, grilling, broiling, baking, poaching, steaming, or stir-frying. Talk to a dietitian to learn more about healthy cooking methods. °· Limit fluids if directed by your health care provider. Fluid restriction may reduce symptoms of heart failure in some people. °· Weigh yourself every day. Daily weights are important in the early recognition of excess fluid. You should weigh yourself every morning after you urinate and before you eat breakfast. Wear the same amount of clothing each time you weigh yourself. Record your daily weight. Provide your health care provider with your weight record. °· Monitor and record your blood pressure if directed by your health care   provider.  Check your pulse if directed by your health care provider.  Lose weight if directed by your health care provider. Weight loss may reduce symptoms of heart failure in some people.  Stop smoking or chewing tobacco. Nicotine makes your heart work harder by causing your blood vessels to constrict.  Do not use nicotine gum or patches before talking to your health care provider.  Keep all follow-up visits as directed by your health care provider. This is important.  Limit alcohol intake to no more than 1 drink per day for nonpregnant women and 2 drinks per day for men. One drink equals 12 ounces of beer, 5 ounces of wine, or 1 ounces of hard liquor. Drinking more than that is harmful to your heart. Tell your health care provider if you drink alcohol several times a week. Talk with your health care provider about whether alcohol is safe for you. If your heart has already been damaged by alcohol or you have severe heart failure, drinking alcohol should be stopped completely.  Stop illicit drug use.  Stay up-to-date with immunizations. It is especially important to prevent respiratory infections through current pneumococcal and influenza immunizations.  Manage other health conditions such as hypertension, diabetes, thyroid disease, or abnormal heart rhythms as directed by your health care provider.  Learn to manage stress.  Plan rest periods when fatigued.  Learn strategies to manage high temperatures. If the weather is extremely hot:  Avoid vigorous physical activity.  Use air conditioning or fans or seek a cooler location.  Avoid caffeine and alcohol.  Wear loose-fitting, lightweight, and light-colored clothing.  Learn strategies to manage cold temperatures. If the weather is extremely cold:  Avoid vigorous physical activity.  Layer clothes.  Wear mittens or gloves, a hat, and a scarf when going outside.  Avoid alcohol.  Obtain ongoing education and support as needed.  Participate in or seek rehabilitation as needed to maintain or improve independence and quality of life. SEEK MEDICAL CARE IF:   You have a rapid weight gain.  You have increasing shortness of breath that is unusual for you.  You are unable to participate in your usual physical activities.  You tire  easily.  You cough more than normal, especially with physical activity.  You have any or more swelling in areas such as your hands, feet, ankles, or abdomen.  You are unable to sleep because it is hard to breathe.  You feel like your heart is beating fast (palpitations).  You become dizzy or light-headed upon standing up. SEEK IMMEDIATE MEDICAL CARE IF:   You have difficulty breathing.  There is a change in mental status such as decreased alertness or difficulty with concentration.  You have a pain or discomfort in your chest.  You have an episode of fainting (syncope). MAKE SURE YOU:   Understand these instructions.  Will watch your condition.  Will get help right away if you are not doing well or get worse.   This information is not intended to replace advice given to you by your health care provider. Make sure you discuss any questions you have with your health care provider.   Document Released: 01/12/2005 Document Revised: 05/29/2014 Document Reviewed: 02/12/2012 Elsevier Interactive Patient Education 2016 [employer] Endoscopic Saphenous Vein Harvesting, Care After Refer to this sheet in the next few weeks. These instructions provide you with information on caring for yourself after your procedure. Your health care provider may also give you more specific instructions. Your treatment has been  planned according to current medical practices, but problems sometimes occur. Call your health care provider if you have any problems or questions after your procedure. HOME CARE INSTRUCTIONS Medicine  Take whatever pain medicine your surgeon prescribes. Follow the directions carefully. Do not take over-the-counter pain medicine unless your surgeon says it is okay. Some pain medicine can cause bleeding problems for several weeks after surgery.  Follow your surgeon's instructions about driving. You will probably not be permitted to drive after heart surgery.  Take any medicines  your surgeon prescribes. Any medicines you took before your heart surgery should be checked with your health care provider before you start taking them again. Wound care  If your surgeon has prescribed an elastic bandage or stocking, ask how long you should wear it.  Check the area around your surgical cuts (incisions) whenever your bandages (dressings) are changed. Look for any redness or swelling.  You will need to return to have the stitches (sutures) or staples taken out. Ask your surgeon when to do that.  Ask your surgeon when you can shower or bathe. Activity  Try to keep your legs raised when you are sitting.  Do any exercises your health care providers have given you. These may include deep breathing exercises, coughing, walking, or other exercises. SEEK MEDICAL CARE IF:  You have any questions about your medicines.  You have more leg pain, especially if your pain medicine stops working.  New or growing bruises develop on your leg.  Your leg swells, feels tight, or becomes red.  You have numbness in your leg. SEEK IMMEDIATE MEDICAL CARE IF:  Your pain gets much worse.  Blood or fluid leaks from any of the incisions.  Your incisions become warm, swollen, or red.  You have chest pain.  You have trouble breathing.  You have a fever.  You have more pain near your leg incision. MAKE SURE YOU:  Understand these instructions.  Will watch your condition.  Will get help right away if you are not doing well or get worse.   This information is not intended to replace advice given to you by your health care provider. Make sure you discuss any questions you have with your health care provider.   Document Released: 09/24/2010 Document Revised: 02/02/2014 Document Reviewed: 09/24/2010 Elsevier Interactive Patient Education 2016 [employer] Apixaban oral tablets What is this medicine? APIXABAN (a PIX a ban) is an anticoagulant (blood thinner). It is used to lower the  chance of stroke in people with a medical condition called atrial fibrillation. It is also used to treat or prevent blood clots in the lungs or in the veins. This medicine may be used for other purposes; ask your health care provider or pharmacist if you have questions. What should I tell my health care provider before I take this medicine? They need to know if you have any of these conditions: -bleeding disorders -bleeding in the brain -blood in your stools (black or tarry stools) or if you have blood in your vomit -history of stomach bleeding -kidney disease -liver disease -mechanical heart valve -an unusual or allergic reaction to apixaban, other medicines, foods, dyes, or preservatives -pregnant or trying to get pregnant -breast-feeding How should I use this medicine? Take this medicine by mouth with a glass of water. Follow the directions on the prescription label. You can take it with or without food. If it upsets your stomach, take it with food. Take your medicine at regular intervals. Do not take it more  often than directed. Do not stop taking except on your doctor's advice. Stopping this medicine may increase your risk of a blot clot. Be sure to refill your prescription before you run out of medicine. Talk to your pediatrician regarding the use of this medicine in children. Special care may be needed. Overdosage: If you think you have taken too much of this medicine contact a poison control center or emergency room at once. NOTE: This medicine is only for you. Do not share this medicine with others. What if I miss a dose? If you miss a dose, take it as soon as you can. If it is almost time for your next dose, take only that dose. Do not take double or extra doses. What may interact with this medicine? This medicine may interact with the following: -aspirin and aspirin-like medicines -certain medicines for fungal infections like ketoconazole and itraconazole -certain medicines for  seizures like carbamazepine and phenytoin -certain medicines that treat or prevent blood clots like warfarin, enoxaparin, and dalteparin -clarithromycin -NSAIDs, medicines for pain and inflammation, like ibuprofen or naproxen -rifampin -ritonavir -St. John's wort This list may not describe all possible interactions. Give your health care provider a list of all the medicines, herbs, non-prescription drugs, or dietary supplements you use. Also tell them if you smoke, drink alcohol, or use illegal drugs. Some items may interact with your medicine. What should I watch for while using this medicine? Notify your doctor or health care professional and seek emergency treatment if you develop breathing problems; changes in vision; chest pain; severe, sudden headache; pain, swelling, warmth in the leg; trouble speaking; sudden numbness or weakness of the face, arm, or leg. These can be signs that your condition has gotten worse. If you are going to have surgery, tell your doctor or health care professional that you are taking this medicine. Tell your health care professional that you use this medicine before you have a spinal or epidural procedure. Sometimes people who take this medicine have bleeding problems around the spine when they have a spinal or epidural procedure. This bleeding is very rare. If you have a spinal or epidural procedure while on this medicine, call your health care professional immediately if you have back pain, numbness or tingling (especially in your legs and feet), muscle weakness, paralysis, or loss of bladder or bowel control. Avoid sports and activities that might cause injury while you are using this medicine. Severe falls or injuries can cause unseen bleeding. Be careful when using sharp tools or knives. Consider using an Neurosurgeon. Take special care brushing or flossing your teeth. Report any injuries, bruising, or red spots on the skin to your doctor or health care  professional. What side effects may I notice from receiving this medicine? Side effects that you should report to your doctor or health care professional as soon as possible: -allergic reactions like skin rash, itching or hives, swelling of the face, lips, or tongue -signs and symptoms of bleeding such as bloody or black, tarry stools; red or dark-brown urine; spitting up blood or brown material that looks like coffee grounds; red spots on the skin; unusual bruising or bleeding from the eye, gums, or nose This list may not describe all possible side effects. Call your doctor for medical advice about side effects. You may report side effects to FDA at 1-800-FDA-1088. Where should I keep my medicine? Keep out of the reach of children. Store at room temperature between 20 and 25 degrees C (68 and 77  degrees F). Throw away any unused medicine after the expiration date. NOTE: This sheet is a summary. It may not cover all possible information. If you have questions about this medicine, talk to your doctor, pharmacist, or health care provider.    2016, Elsevier[employer]. (2012-09-16 11:59:24) Coronary Artery Bypass Grafting, Care After These instructions give you information on caring for yourself after your procedure. Your doctor may also give you more specific instructions. Call your doctor if you have any problems or questions after your procedure.  HOME CARE  Only take medicine as told by your doctor. Take medicines exactly as told. Do not stop taking medicines or start any new medicines without talking to your doctor first.  Take your pulse as told by your doctor.  Do deep breathing as told by your doctor. Use your breathing device (incentive spirometer), if given, to practice deep breathing several times a day. Support your chest with a pillow or your arms when you take deep breaths or cough.  Keep the area clean, dry, and protected where the surgery cuts (incisions) were made. Remove  bandages (dressings) only as told by your doctor. If strips were applied to surgical area, do not take them off. They fall off on their own.  Check the surgery area daily for puffiness (swelling), redness, or leaking fluid.  If surgery cuts were made in your legs:  Avoid crossing your legs.  Avoid sitting for long periods of time. Change positions every 30 minutes.  Raise your legs when you are sitting. Place them on pillows.  Wear stockings that help keep blood clots from forming in your legs (compression stockings).  Only take sponge baths until your doctor says it is okay to take showers. Pat the surgery area dry. Do not rub the surgery area with a washcloth or towel. Do not bathe, swim, or use a hot tub until your doctor says it is okay.  Eat foods that are high in fiber. These include raw fruits and vegetables, whole grains, beans, and nuts. Choose lean meats. Avoid canned, processed, and fried foods.  Drink enough fluids to keep your pee (urine) clear or pale yellow.  Weigh yourself every day.  Rest and limit activity as told by your doctor. You may be told to:  Stop any activity if you have chest pain, shortness of breath, changes in heartbeat, or dizziness. Get help right away if this happens.  Move around often for short amounts of time or take short walks as told by your doctor. Gradually become more active. You may need help to strengthen your muscles and build endurance.  Avoid lifting, pushing, or pulling anything heavier than 10 pounds (4.5 kg) for at least 6 weeks after surgery.  Do not drive until your doctor says it is okay.  Ask your doctor when you can go back to work.  Ask your doctor when you can begin sexual activity again.  Follow up with your doctor as told. GET HELP IF:  You have puffiness, redness, more pain, or fluid draining from the incision site.  You have a fever.  You have puffiness in your ankles or legs.  You have pain in your  legs.  You gain 2 or more pounds (0.9 kg) a day.  You feel sick to your stomach (nauseous) or throw up (vomit).  You have watery poop (diarrhea). GET HELP RIGHT AWAY IF:  You have chest pain that goes to your jaw or arms.  You have shortness of breath.  You  have a fast or irregular heartbeat.  You notice a "clicking" in your breastbone when you move.  You have numbness or weakness in your arms or legs.  You feel dizzy or light-headed. MAKE SURE YOU:  Understand these instructions.  Will watch your condition.  Will get help right away if you are not doing well or get worse.   This information is not intended to replace advice given to you by your health care provider. Make sure you discuss any questions you have with your health care provider.   Document Released: 01/17/2013 Document Reviewed: 01/17/2013 Elsevier Interactive Patient Education 2016 [employer].  Information on my medicine - ELIQUIS (apixaban)  This medication education was reviewed with me or my healthcare representative as part of my discharge preparation.  The pharmacist that spoke with me during my hospital stay was:  Severiano Gilbert, [facility]  Why was Eliquis prescribed for you? Eliquis was prescribed for you to reduce the risk of forming blood clots that can cause a stroke.  What do You need to know about Eliquis ? Take your Eliquis TWICE DAILY - one tablet in the morning and one tablet in the evening with or without food.  It would be best to take the doses about the same time each day.  If you have difficulty swallowing the tablet whole please discuss with your pharmacist how to take the medication safely.  Take Eliquis exactly as prescribed by your doctor and DO NOT stop taking Eliquis without talking to the doctor who prescribed the medication.  Stopping may increase your risk of developing a new clot or stroke.  Refill your prescription before you run out.  After discharge, you should  have regular check-up appointments with your healthcare provider that is prescribing your Eliquis.  In the future your dose may need to be changed if your kidney function or weight changes by a significant amount or as you get older.  What do you do if you miss a dose? If you miss a dose, take it as soon as you remember on the same day and resume taking twice daily.  Do not take more than one dose of ELIQUIS at the same time.  Important Safety Information A possible side effect of Eliquis is bleeding. You should call your healthcare provider right away if you experience any of the following: ? Bleeding from an injury or your nose that does not stop. ? Unusual colored urine (red or dark brown) or unusual colored stools (red or black). ? Unusual bruising for unknown reasons. ? A serious fall or if you hit your head (even if there is no bleeding).  Some medicines may interact with Eliquis and might increase your risk of bleeding or clotting while on Eliquis. To help avoid this, consult your healthcare provider or pharmacist prior to using any new prescription or non-prescription medications, including herbals, vitamins, non-steroidal anti-inflammatory drugs (NSAIDs) and supplements.  This website has more information on Eliquis (apixaban): [email or web address].cy.

## 2015-02-15 NOTE — Progress Notes (Signed)
Pt transferred to: [facility] Anticipated date of transfer: 02/15/15 Transported by: Ambulance ([facility])  Time Tentatively Scheduled for: 4:00 PM  Family notified: Wille Celeste- Sister-in-law Nursing notified to call report; DC summary sent to the facility. Patient and family are pleased with private room at [facility]. Patient verbalizes concerns about his aftercare; he lives alone and feels that his level of independence has deteriorated.  Discussed possible option of private care (he says his funds are limited) and also to consider ALF in the future.  Patient stated he would consider this. He does not have any family locally who could stay with him or be supportive on a daily basis. His sister in law indicated that family could at least check on him telephonically.   No further CSW needs identified. CSW signing off.  Lorri Frederick. Andria Rhein [phone]    CSW signing off.  Lovette Cliche, LCSW [phone]

## 2015-02-15 NOTE — Care Management Important Message (Signed)
Important Message  Patient Details  Name: Bradley Benitez MRN: [id number] Date of Birth: [birth year]   Medicare Important Message Given:  Yes    Kyla Balzarine 02/15/2015, 3:02 PM

## 2015-02-15 NOTE — Progress Notes (Signed)
02/15/2015 2:09 PM Chest tube sutures out.  Pt tolerated well. Kathryne Hitch

## 2015-02-15 NOTE — Clinical Social Work Placement (Signed)
   CLINICAL SOCIAL WORK PLACEMENT  NOTE  Date:  02/15/2015  Patient Details  Name: Bradley Benitez MRN: [id number] Date of Birth: 1950/07/12  Clinical Social Work is seeking post-discharge placement for this patient at the Skilled  Nursing Facility level of care (*CSW will initial, date and re-position this form in  chart as items are completed):  Yes   Patient/family provided with [facility] Clinical Social Work Department's list of facilities offering this level of care within the geographic area requested by the patient (or if unable, by the patient's family).  Yes   Patient/family informed of their freedom to choose among providers that offer the needed level of care, that participate in Medicare, Medicaid or managed care program needed by the patient, have an available bed and are willing to accept the patient.  Yes   Patient/family informed of [facility]'s ownership interest in [facility] and [facility], as well as of the fact that they are under no obligation to receive care at these facilities.  PASRR submitted to EDS on 02/11/15     PASRR number received on 02/11/15     Existing PASRR number confirmed on       FL2 transmitted to all facilities in geographic area requested by pt/family on 02/11/15     FL2 transmitted to all facilities within larger geographic area on       Patient informed that his/her managed care company has contracts with or will negotiate with certain facilities, including the following:   ([employer]  Notified at d/c and auth number provided to CSW and to facility)     No   Patient/family informed of bed offers received.  Patient chooses bed at [facility] (Wanted [facility] but no private room so chose [facility])     Physician recommends and patient chooses bed at      Patient to be transferred to [facility] on 02/15/15.  Patient to be transferred to facility by Ambulance Sharin Mons)     Patient family notified on 02/15/15  of transfer.  Name of family member notified:  Janie_ sister in law     PHYSICIAN Please sign FL2, Please prepare priority discharge summary, including medications, Please prepare prescriptions     Additional Comment:    _______________________________________________ Darylene Price, LCSW 02/15/2015, 4:26 PM

## 2015-02-15 NOTE — Progress Notes (Signed)
Patient ID: Bradley Benitez, male   DOB: [birth year], 65 y.o.   MRN: [id number]   65 yo with history of DM and smoking but has not had medical care in decades presented with acute systolic CHF.  Echo showed EF 25-30% with apical thrombus and LHC showed 3VD.  Underwent CABG 1/6. On 1/10 had VF arrest with defib x 9.   Yesterday lasix was cut back to every other day, losartan was cut back, and eliquis started. BP soft. Weight down another 5 pounds.   Denies SOB or dizziness.    Scheduled Meds: . amiodarone  200 mg Oral Daily  . apixaban  2.5 mg Oral BID  . aspirin  81 mg Oral Daily  . atorvastatin  80 mg Oral q1800  . bisacodyl  10 mg Oral Daily   Or  . bisacodyl  10 mg Rectal Daily  . chlorhexidine   Topical Daily  . digoxin  0.125 mg Oral Daily  . docusate sodium  200 mg Oral Daily  . feeding supplement (GLUCERNA SHAKE)  237 mL Oral TID BM  . furosemide  40 mg Oral QODAY  . insulin aspart  0-15 Units Subcutaneous TID WC  . insulin detemir  20 Units Subcutaneous Daily  . levalbuterol  0.63 mg Nebulization TID  . losartan  12.5 mg Oral BID  . pantoprazole  40 mg Oral Daily  . potassium chloride  20 mEq Oral Daily  . sodium chloride  3 mL Intravenous Q12H  . spironolactone  25 mg Oral Daily  . sucralfate  1 g Oral TID WC & HS   Continuous Infusions: . sodium chloride 250 mL (02/05/15 0730)  . sodium chloride 20 mL/hr at 02/09/15 1154   PRN Meds:.acetaminophen, alum & mag hydroxide-simeth, Gerhardt's butt cream, lactulose, ondansetron (ZOFRAN) IV, oxyCODONE, sodium chloride, traMADol    Filed Vitals:   02/14/15 2013 02/14/15 2041 02/14/15 2047 02/15/15 0257  BP:  85/58 142/80 91/63  Pulse: 71 71  71  Temp:  97.8 F (36.6 C)  97.4 F (36.3 C)  TempSrc:  Oral  Oral  Resp: 18 18  20   Height:      Weight:    208 lb 9.6 oz (94.62 kg)  SpO2: 98% 95%  95%    Intake/Output Summary (Last 24 hours) at 02/15/15 0726 Last data filed at 02/14/15 2012  Gross per 24 hour  Intake     720 ml  Output    325 ml  Net    395 ml    LABS: Basic Metabolic Panel:  Recent Labs  40/98/11 0438 02/15/15 0445  NA 137 137  K 4.0 3.9  CL 101 104  CO2 29 27  GLUCOSE 111* 126*  BUN 17 18  CREATININE 1.40* 1.31*  CALCIUM 8.7* 8.3*   Liver Function Tests: No results for input(s): AST, ALT, ALKPHOS, BILITOT, PROT, ALBUMIN in the last 72 hours. No results for input(s): LIPASE, AMYLASE in the last 72 hours. CBC:  Recent Labs  02/13/15 1030 02/15/15 0445  WBC 10.0 9.2  HGB 10.1* 9.6*  HCT 30.8* 30.1*  MCV 84.2 85.5  PLT 280 284   Cardiac Enzymes: No results for input(s): CKTOTAL, CKMB, CKMBINDEX, TROPONINI in the last 72 hours. BNP: Invalid input(s): POCBNP D-Dimer: No results for input(s): DDIMER in the last 72 hours. Hemoglobin A1C: No results for input(s): HGBA1C in the last 72 hours. Fasting Lipid Panel: No results for input(s): CHOL, HDL, LDLCALC, TRIG, CHOLHDL, LDLDIRECT in the last 72 hours.  Thyroid Function Tests: No results for input(s): TSH, T4TOTAL, T3FREE, THYROIDAB in the last 72 hours.  Invalid input(s): FREET3 Anemia Panel: No results for input(s): VITAMINB12, FOLATE, FERRITIN, TIBC, IRON, RETICCTPCT in the last 72 hours.  RADIOLOGY: Dg Chest 2 View  02/10/2015  CLINICAL DATA:  Acute respiratory failure with hypoxemia. Congestive heart failure. Poorly controlled diabetes with diabetic neuropathy. EXAM: CHEST  2 VIEW COMPARISON:  02/09/2015 FINDINGS: Right arm PICC line in right subclavian central venous catheter remain in appropriate position. Heart size is normal. Prior CABG again noted. Bibasilar atelectasis or infiltrates show no significant change. Probable tiny bilateral pleural effusions again noted. IMPRESSION: No significant change in bibasilar atelectasis or infiltrates and probable tiny bilateral pleural effusions. Electronically Signed   By: Myles Rosenthal M.D.   On: 02/10/2015 08:57   Dg Chest 2 View  02/05/2015  CLINICAL DATA:  Shortness  of breath, status post CABG 4 days ago. EXAM: CHEST  2 VIEW COMPARISON:  PA and lateral chest x-ray of February 04, 2015 FINDINGS: The left lung is well-expanded. There is minimal basilar atelectasis medially. On the right a small amount of pleural fluid remains. The cardiac silhouette is mildly enlarged. The pulmonary vascularity is not engorged. There is no pneumothorax or pneumomediastinum. There are 7 intact sternal wires. The right-sided PICC line tip projects over the junction of the distal SVC with the right atrium. IMPRESSION: Further interval improvement in pulmonary interstitial edema. Persistent left basilar subsegmental atelectasis and persistent trace right pleural effusion. Electronically Signed   By: Sheddrick  [place] M.D.   On: 02/05/2015 07:53   Dg Chest 2 View  02/04/2015  CLINICAL DATA:  Acute respiratory failure, CHF, previous MI, former smoker. EXAM: CHEST  2 VIEW COMPARISON:  Portable chest x-ray of February 03, 2015 FINDINGS: The lungs are borderline hypoinflated. There is a trace of pleural fluid on the right. Right basilar atelectasis or pneumonia has improved. There remains retrocardiac subsegmental atelectasis but it has improved as well. The cardiac silhouette remains enlarged. The pulmonary vascularity is less engorged. There are post CABG changes. The right-sided PICC line tip projects over the distal third of the SVC. The right internal jugular Cordis sheath has been removed. IMPRESSION: Mild interval improvement in bibasilar atelectasis or pneumonia. Trace right pleural effusion remains. Stable mild cardiomegaly with decreased pulmonary vascular congestion. Electronically Signed   By: Knut  [place] M.D.   On: 02/04/2015 08:04   Dg Chest 2 View  01/26/2015  CLINICAL DATA:  Shortness of breath this AM; h/o diabetes; former smoker EXAM: CHEST  2 VIEW COMPARISON:  01/25/2015 FINDINGS: Heart size is normal. There is prominence of interstitial markings, increased. Perihilar peribronchial  thickening and noted. There are no focal consolidations. There are small bilateral pleural effusions. IMPRESSION: 1. Increased bronchitic changes. 2. Increased bilateral pleural effusions. Electronically Signed   By: Norva Pavlov M.D.   On: 01/26/2015 10:34   Dg Chest Port 1 View  02/09/2015  CLINICAL DATA:  Short of breath EXAM: PORTABLE CHEST 1 VIEW COMPARISON:  02/07/2015 FINDINGS: Normal heart size. Lungs under aerated with bibasilar hypoaeration. Small pleural effusions may be present. No pneumothorax. Right subclavian central venous catheter tip at the cavoatrial junction. IMPRESSION: Bibasilar atelectasis Small pleural effusions may be present. Electronically Signed   By: Jolaine Click M.D.   On: 02/09/2015 10:32   Dg Chest Port 1 View  02/07/2015  CLINICAL DATA:  CABG EXAM: PORTABLE CHEST 1 VIEW COMPARISON:  02/06/2015 FINDINGS: Right PICC and right subclavian central  venous catheter are stable. Normal heart size. Low volumes. Bibasilar atelectasis. Vascular congestion has resolved. Small pleural effusions are suspected. IMPRESSION: Resolved vascular congestion. Bibasilar atelectasis. Small pleural effusions persist. Electronically Signed   By: Jolaine Click M.D.   On: 02/07/2015 07:36   Dg Chest Port 1 View  02/06/2015  CLINICAL DATA:  CABG. EXAM: PORTABLE CHEST 1 VIEW COMPARISON:  02/05/2015. FINDINGS: Right PICC line in stable position. Prior CABG. Cardiomegaly with pulmonary venous congestion and interstitial prominence, no change from prior exam. Findings consistent with stable changes of congestive heart failure. Small bilateral pleural effusions. No pneumothorax. IMPRESSION: 1. Right PICC line in stable position. 2. Prior CABG. Persistent changes of congestive heart failure with mild pulmonary interstitial edema and small pleural effusions. Electronically Signed   By: Maisie Fus  Register   On: 02/06/2015 07:20   Dg Chest Port 1 View  02/05/2015  CLINICAL DATA:  Code. EXAM: PORTABLE CHEST 1  VIEW COMPARISON:  Earlier today at 627 hours.  One day prior. FINDINGS: 741 hours. Extensive artifact degradation due to overlying support apparatus, external pacer. A right sided PICC line terminates at the low SVC. Patient rotated left. Prior median sternotomy. Cardiomegaly accentuated by AP portable technique. Left costophrenic angle excluded. Probable layering right pleural effusion. No pneumothorax. Low lung volumes, accentuating interstitial edema. Developing right base airspace disease. IMPRESSION: Developing congestive heart failure. Probable small right pleural effusion with adjacent right base atelectasis. Extensive artifact degradation. Electronically Signed   By: Jeronimo Greaves M.D.   On: 02/05/2015 07:50   Dg Chest Port 1 View  02/03/2015  CLINICAL DATA:  Atelectasis EXAM: PORTABLE CHEST 1 VIEW COMPARISON:  Chest x-rays dated 02/02/2015 and 02/01/2015. FINDINGS: Left-sided chest tube is no longer seen. Swan-Ganz catheter is also no longer seen. Mediastinal drains have also been removed. Right IJ central line access site remains with tip projected over the upper SVC. Right-sided PICC line appears well positioned with tip projected over the lower SVC. Cardiomediastinal silhouette is stable in size and configuration. Cardiomegaly is stable. Median sternotomy wires are intact and stable in alignment, presumably for CABG. There is mild central pulmonary vascular congestion. Ill-defined bibasilar airspace opacities are not not significantly changed, presumably atelectasis and/or small effusions. No new lung findings. No pneumothorax seen. IMPRESSION: 1. Multiple tubes and lines removed, as above. 2. Persistent mild central pulmonary vascular congestion without evidence of overt pulmonary edema. 3. Persistent ill-defined bibasilar airspace opacities, presumably atelectasis and/or small effusions. 4. No new lung findings. 5. Stable cardiomegaly. Electronically Signed   By: Bary Richard M.D.   On: 02/03/2015  08:02   Dg Chest Port 1 View  02/02/2015  CLINICAL DATA:  65 year old male status post 4 vessel CABG EXAM: PORTABLE CHEST 1 VIEW COMPARISON:  Prior chest x-ray 02/01/2015 FINDINGS: The patient has been extubated and the nasogastric tube removed. A right IJ vascular sheath convey is a Swan-Ganz catheter into the heart. The tip of the Swan overlies the proximal right lower lobe pulmonary artery. A right upper extremity PICC is again noted in good position with the tip overlying the upper right atrium. Mediastinal and left thoracic drains are present via a subxiphoid approach. Patient is status post median sternotomy with evidence of multivessel CABG. Stable cardiomegaly. Mediastinal contours are grossly unchanged given lower inspiratory volumes. Lower inspiratory volumes with increased bibasilar opacities favored to reflect atelectasis. Pulmonary vascular congestion without overt edema. No evidence of pneumothorax or large effusion. No acute osseous abnormality. IMPRESSION: 1. Interval extubation and removal of nasogastric  tube. 2. Lower inspiratory volumes with increasing bibasilar atelectasis. 3. Pulmonary vascular congestion without overt edema. 4. Other support apparatus in stable and satisfactory position as above. Electronically Signed   By: Malachy Moan M.D.   On: 02/02/2015 08:54   Dg Chest Port 1 View  02/01/2015  CLINICAL DATA:  Patient status post CABG procedure. EXAM: PORTABLE CHEST 1 VIEW COMPARISON:  Chest radiograph 01/26/2015. FINDINGS: ET tube terminates in the mid trachea. Enteric tube courses inferior to the diaphragm. Right IJ approach pulmonary arterial catheter is present with tip projecting over the expected location of the right main pulmonary artery. Multiple mediastinal drains are present. Right upper extremity PICC line is present with tip projecting at the superior cavoatrial junction. Left chest tube in place. Stable cardiac and mediastinal contours status post median sternotomy  and CABG procedure. No large area of pulmonary consolidation. No definite pleural effusion or pneumothorax. No aggressive or acute appearing osseous lesions. IMPRESSION: Support apparatus as above. Electronically Signed   By: Annia Belt M.D.   On: 02/01/2015 16:04   Dg Chest Port 1 View  01/25/2015  CLINICAL DATA:  Acute respiratory failure with hypoxemia.  Fever. EXAM: PORTABLE CHEST 1 VIEW COMPARISON:  01/23/2015. FINDINGS: Grossly stable enlarged cardiac silhouette. Prominent pulmonary vasculature and interstitial markings with improvement. Interval small amount of linear density in the right mid lung zone. Minimal right pleural fluid. Central peribronchial thickening. Unremarkable bones. IMPRESSION: 1. Improving changes of congestive heart failure. 2. Small amount of linear atelectasis in the right mid lung zone. 3. Moderate bronchitic changes. Electronically Signed   By: Beckie Salts M.D.   On: 01/25/2015 16:34   Dg Chest Port 1 View  01/23/2015  CLINICAL DATA:  Congestive heart failure EXAM: PORTABLE CHEST 1 VIEW COMPARISON:  None. FINDINGS: There is generalized interstitial edema with patchy alveolar edema in the bases. Heart is enlarged with pulmonary venous hypertension. No adenopathy. IMPRESSION: Evidence of congestive heart failure with edema most pronounced in the lung bases. Electronically Signed   By: Bretta Bang III M.D.   On: 01/23/2015 09:34   Mr Card Morphology Wo/w Cm  01/29/2015  CLINICAL DATA:  CAD, assess for viability EXAM: CARDIAC MRI TECHNIQUE: The patient was scanned on a 1.5 Tesla GE magnet. A dedicated cardiac coil was used. Functional imaging was done using Fiesta sequences. 2,3, and 4 chamber views were done to assess for RWMA's. Modified Simpson's rule using a short axis stack was used to calculate an ejection fraction on a dedicated work [employer]. The patient received 34 cc of Multihance. After 10 minutes inversion recovery sequences were used to  assess for infiltration and scar tissue. CONTRAST:  34 cc Multihance FINDINGS: There were bilateral small to moderate pleural effusions. There was a trivial pericardial effusion. Normal left ventricular size. EF 26% with mid anteroseptal and mid anterior akinesis. The mid anterolateral wall was hypokinetic. The apical septal, inferior, and anterior segments were akinetic and the apical lateral segment was hypokinetic. The true apex was akinetic. A small apical thrombus was noted. The atria were normal in size. The right ventricle was normal in size and systolic function. The aortic valve was trileaflet with no stenosis or significant regurgitation. There did not appear to be significant mitral regurgitation. On delayed enhancement imaging, there was subendocardial 76-99% wall thickness late gadolinium enhancement (LGE) in the mid anteroseptal and mid anterior wall segments, in the apical septal/anterior/inferior wall segments, and in the true apex. Basal segments without LGE, anterolateral wall  without significant LGE, and mid inferior wall without LGE. MEASUREMENTS: MEASUREMENTS LV EDV 212 mL LV SV 54 mL LV EF 26% IMPRESSION: 1. Severe LV systolic dysfunction, EF 26%. Wall motion abnormalities as above. 2.  Normal RV size and systolic function . 3. LGE pattern suggestive of infarction in LAD territory. The mid anterior/anteroseptal and apical anterior/septal/inferior wall segments and the true apex do not appear viable (probably not likely to improve function with revascularization). The remainder of the left ventricle appears viable. 4.  There is a small LV apical thrombus. Dalton Mclean Electronically Signed   By: [name] M.D.   On: 01/29/2015 09:10    PHYSICAL EXAM General: Elderly appearing, NAD . In bed   Neck: JVP 5-6 cm, no thyromegaly or lymphadenopathy noted  Lungs: Decreased bases. CV: Nondisplaced PMI.  Heart regular S1/S2, no S3/S4, no murmur. Ecchymotic/ edema around SVG harvest site on  R. . No carotid bruit.  Normal pedal pulses.  Abdomen: Soft, non-tender, non-distended, no HSM. No bruits or masses. +BS  Neurologic: Alert and oriented x 3.  Psych: Normal affect. Extremities: No clubbing or cyanosis. RLE ecchymotic. LLE no edema.  RLE tender  TELEMETRY: SR 70s  ASSESSMENT AND PLAN: 64 yo with history of DM and smoking but has not had medical care in decades presented with acute systolic CHF.  Echo showed EF 25-30% with apical thrombus and LHC showed 3VD. 1. Ventricular fibrillation arrest: ; suspect scar-mediated - No further VT on po amio. Continue amio 200 mg daily.  - Intrinsic rate changed to 60s yesterday.  2. Acute systolic CHF: EF 45% with regional WMAs, ischemic cardiomyopathy.  Now s/p CABG x 4.   - Milrinone stopped 02/09/15.  - Continue Losartan to 12.5 mg bid. No b-blocker yet Volume status low. Continue lasix to 40 mg every other day.   -- Continue spironolactone 25 mg daily.  -Renal function stable.  3. CAD: Diffuse 3 vessel disease, see cath report above. MRI reviewed: LAD territory did not appear to have significant viability.  Remainder of LV myocardium did appear viable.  He had severe disease in LCx and RCA territory that may benefit from revascularization but suspect LAD territory will not recover.   - s/p CABG x 4 on 1/6.  Suspect ventricular fibrillation arrest 1/10 was scar-mediated as opposed to occlusion of graft, etc.  - Continue ASA 81 mg daily, atorvastatin 80 daily.  4. LV mural thrombus: Seen on 1/10 echo, mobile thrombus.  Now on subQ lovenox. - On lower dose of eliquis.  5. Diabetes:  -  Continue insulin.  6. Foot lesions:  - WOC consult appreciated.  - Continue dry dressing per WOC 7. Diabetic neuropathy:  - Continue gabapentin 300 mg bid.  8. R foot/calf pain and increased swelling compared to L side - VAS [place] with NO DVT. Now on cephalexin for possible infection.  9. Deconditioning:  - Continue to mobilize as able. PT following  with recommendations for SNF.    We will schedule HF follow up with Dr Shirlee Latch.    Amy Clegg NP-C  02/15/2015 7:26 AM   Advanced Heart Failure Team Pager [phone] (M-F; 7a - 4p)   Patient seen with NP, agree with the above note.  He is stable today, no dyspnea.  Will go to facility on his current cardiac regimen (no changes today).  Will need to increase Eliquis to 5 mg bid at followup if he is tolerating it.   [name] 02/15/2015 12:28 PM

## 2015-02-18 ENCOUNTER — Other Ambulatory Visit: Payer: Self-pay

## 2015-02-18 NOTE — Patient Outreach (Signed)
This RNCM made call to patient, attempt unsuccessful. Attempt made to contact with Charlesetta Ivory, patient's contact who advised this RNCM patient was admitted to [facility].    Plan: Follow up with disposition from skilled nursing facility in 2 weeks

## 2015-02-19 ENCOUNTER — Non-Acute Institutional Stay (SKILLED_NURSING_FACILITY): Payer: PPO | Admitting: Internal Medicine

## 2015-02-19 DIAGNOSIS — I10 Essential (primary) hypertension: Secondary | ICD-10-CM | POA: Diagnosis not present

## 2015-02-19 DIAGNOSIS — R5381 Other malaise: Secondary | ICD-10-CM

## 2015-02-19 DIAGNOSIS — I4891 Unspecified atrial fibrillation: Secondary | ICD-10-CM

## 2015-02-19 DIAGNOSIS — D62 Acute posthemorrhagic anemia: Secondary | ICD-10-CM | POA: Diagnosis not present

## 2015-02-19 DIAGNOSIS — I110 Hypertensive heart disease with heart failure: Secondary | ICD-10-CM

## 2015-02-19 DIAGNOSIS — I214 Non-ST elevation (NSTEMI) myocardial infarction: Secondary | ICD-10-CM

## 2015-02-19 DIAGNOSIS — E118 Type 2 diabetes mellitus with unspecified complications: Secondary | ICD-10-CM | POA: Diagnosis not present

## 2015-02-19 DIAGNOSIS — G4700 Insomnia, unspecified: Secondary | ICD-10-CM | POA: Diagnosis not present

## 2015-02-19 DIAGNOSIS — G8912 Acute post-thoracotomy pain: Secondary | ICD-10-CM

## 2015-02-19 DIAGNOSIS — K5900 Constipation, unspecified: Secondary | ICD-10-CM | POA: Diagnosis not present

## 2015-02-19 DIAGNOSIS — E46 Unspecified protein-calorie malnutrition: Secondary | ICD-10-CM

## 2015-02-19 DIAGNOSIS — E785 Hyperlipidemia, unspecified: Secondary | ICD-10-CM

## 2015-02-19 DIAGNOSIS — G8918 Other acute postprocedural pain: Secondary | ICD-10-CM

## 2015-02-19 DIAGNOSIS — R0789 Other chest pain: Secondary | ICD-10-CM

## 2015-02-19 NOTE — Progress Notes (Signed)
Patient ID: Bradley Benitez, male   DOB: October 23, 1950, 65 y.o.   MRN: [id number]     Facility: [facility]    PCP: Lisabeth Pick, MD  Code Status: full code  Allergies  Allergen Reactions  . Codeine Other (See Comments)    intolerance    Chief Complaint  Patient presents with  . New Admit To SNF     HPI:  64 y.o. patient is here for short term rehabilitation post hospital admission from 01/23/15-02/15/15 with acute hypoxic respiratory failure in setting of acute systolic CHF in setting of non- STEMI. He also had acute on chronic urinary retention and hyperglycemia. Cardiology was consulted. Echocardiogram showed EF 25-30% with large anterior wall motion abnormality and an apical thrombus. He underwent cardiac catheterization and was noted to have severe multivessel coronary artery disease. Cardiothoracic surgery was consulted. He underwent CABG X 4, endoscopic harvest of right greater saphenous vein. He required inotropic support. He received blood transfusion for acute blood loss anemia. He required diuresis as well. He had ventricular fibrillation and required 9 rounds of defibrillation. He was started on apixaban. He is seen in his room today. He has some numbness to lateral fingers and lateral side of right arm. He has ben refusing to take keflex per nursing. He mentions that was started in hospital for presumed cellulitis and he was asked to discontinue it at time of discharge. He is having trouble sleeping at night.   Review of Systems:  Constitutional: Negative for fever, chills, diaphoresis.  HENT: Negative for headache, congestion, nasal discharge Eyes: Negative for blurred vision, double vision and discharge.  Respiratory: Negative for cough, shortness of breath and wheezing.   Cardiovascular: Negative for chest pain, palpitations, leg swelling.  Gastrointestinal: Negative for heartburn, nausea, vomiting, abdominal pain. Had bowel movement this  am Genitourinary: Negative for dysuria, flank pain.  Musculoskeletal: Negative for back pain, falls Skin: Negative for itching, rash.  Neurological: Negative for dizziness Psychiatric/Behavioral: Negative for depression   Past Medical History  Diagnosis Date  . DM2 (diabetes mellitus, type 2) (HCC)   . Gangrene (HCC)     10+ years ago   Past Surgical History  Procedure Laterality Date  . Knee surgery Left   . Cardiac catheterization N/A 01/25/2015    Procedure: Right/Left Heart Cath and Coronary Angiography;  Surgeon: Tonny Bollman, MD;  Location: [facility] INVASIVE CV LAB;  Service: Cardiovascular;  Laterality: N/A;  . Coronary artery bypass graft N/A 02/01/2015    Procedure: CORONARY ARTERY BYPASS GRAFTING (CABG)x 4 using left internal mammary artery and right greater saphenous leg vein using endoscope.;  Surgeon: [name], MD;  Location: [facility] OR;  Service: Open Heart Surgery;  Laterality: N/A;  . Tee without cardioversion N/A 02/01/2015    Procedure: TRANSESOPHAGEAL ECHOCARDIOGRAM (TEE);  Surgeon: [name], MD;  Location: [facility] OR;  Service: Open Heart Surgery;  Laterality: N/A;  . Placement of centrimag ventricular assist device N/A 02/01/2015    Procedure:  CENTRIMAG VENTRICULAR ASSIST DEVICE, back up;  Surgeon: [name], MD;  Location: [facility] OR;  Service: Open Heart Surgery;  Laterality: N/A;   Social History:   reports that he quit smoking about 21 years ago. His smoking use included Cigarettes. He has a 60 pack-year smoking history. He does not have any smokeless tobacco history on file. He reports that he uses illicit drugs. He reports that he does not drink alcohol.  Family History  Problem Relation Age of Onset  .  Diabetes Mother   . Diabetes Father   . Heart failure Brother     Medications:   Medication List       This list is accurate as of: 02/19/15 12:38 PM.  Always use your most recent med list.               amiodarone 200 MG tablet  Commonly known as:   PACERONE  Take 1 tablet (200 mg total) by mouth daily.     apixaban 2.5 MG Tabs tablet  Commonly known as:  ELIQUIS  Take 1 tablet (2.5 mg total) by mouth 2 (two) times daily.     aspirin 81 MG chewable tablet  Chew 1 tablet (81 mg total) by mouth daily.     atorvastatin 80 MG tablet  Commonly known as:  LIPITOR  Take 1 tablet (80 mg total) by mouth daily at 6 PM.     digoxin 0.125 MG tablet  Commonly known as:  LANOXIN  Take 1 tablet (0.125 mg total) by mouth daily.     docusate sodium 100 MG capsule  Commonly known as:  COLACE  Take 2 capsules (200 mg total) by mouth daily.     feeding supplement (GLUCERNA SHAKE) Liqd  Take 237 mLs by mouth 3 (three) times daily between meals.     furosemide 40 MG tablet  Commonly known as:  LASIX  Take 1 tablet (40 mg total) by mouth every other day.     insulin aspart 100 UNIT/ML injection  Commonly known as:  novoLOG  Inject 0-15 Units into the skin 3 (three) times daily with meals.     insulin detemir 100 UNIT/ML injection  Commonly known as:  LEVEMIR  Inject 0.2 mLs (20 Units total) into the skin daily.     lactulose 10 GM/15ML solution  Commonly known as:  CHRONULAC  Take 30 mLs (20 g total) by mouth daily as needed for mild constipation.     losartan 25 MG tablet  Commonly known as:  COZAAR  Take 0.5 tablets (12.5 mg total) by mouth 2 (two) times daily.     oxyCODONE 5 MG immediate release tablet  Commonly known as:  Oxy IR/ROXICODONE  Take 1-2 tablets (5-10 mg total) by mouth every 4 (four) hours as needed for severe pain.     pantoprazole 40 MG tablet  Commonly known as:  PROTONIX  Take 1 tablet (40 mg total) by mouth daily.     potassium chloride SA 20 MEQ tablet  Commonly known as:  K-DUR,KLOR-CON  Take 1 tablet (20 mEq total) by mouth every other day.     spironolactone 25 MG tablet  Commonly known as:  ALDACTONE  Take 1 tablet (25 mg total) by mouth daily.         Physical Exam: Filed Vitals:    02/19/15 1238  BP: 133/79  Pulse: 71  Temp: 98.4 F (36.9 C)  Resp: 18  SpO2: 97%    General- elderly male, well built, in no acute distress Head- normocephalic, atraumatic Nose- no maxillary or frontal sinus tenderness, no nasal discharge Throat- moist mucus membrane, adentulous Eyes- PERRLA, EOMI, no pallor, no icterus, no discharge, normal conjunctiva, normal sclera Neck- no cervical lymphadenopathy Cardiovascular- normal s1,s2, no murmurs, trace right leg edema Respiratory- bilateral clear to auscultation, no wheeze, no rhonchi, no crackles, no use of accessory muscles Abdomen- bowel sounds present, soft, non tender Musculoskeletal- able to move all 4 extremities, generalized weakness Neurological- no focal deficit, alert and  oriented to person, place and time Skin- warm and dry, surgical incision on chest wall and abdomen healing well, graft site on RLE has dressing in place Psychiatry- normal mood and affect    Labs reviewed: Basic Metabolic Panel:  Recent Labs  82/95/62 0027  02/02/15 0446 02/02/15 1700  02/05/15 0722  02/13/15 1030 02/14/15 0438 02/15/15 0445  NA 141  < > 139  --   < >  --   < > 134* 137 137  K 3.3*  < > 4.5  --   < >  --   < > 4.1 4.0 3.9  CL 107  < > 110  --   < >  --   < > 99* 101 104  CO2 24  < > 24  --   < >  --   < > GLUCOSE 159*  < > 106*  --   < >  --   < > 140* 111* 126*  BUN 15  < > 15  --   < >  --   < > CREATININE 1.30*  < > 1.21 1.54*  < >  --   < > 1.39* 1.40* 1.31*  CALCIUM 8.5*  < > 8.1*  --   < >  --   < > 8.7* 8.7* 8.3*  MG 1.7  < > 2.6* 2.4  --  2.5*  --   --   --   --   PHOS 3.9  --   --   --   --   --   --   --   --   --   < > = values in this interval not displayed. Liver Function Tests:  Recent Labs  01/23/15 0842 01/26/15 1115  AST 21 28  ALT 18 20  ALKPHOS 72 64  BILITOT 0.8 0.5  PROT 7.0 6.6  ALBUMIN 3.9 3.3*   No results for input(s): LIPASE, AMYLASE in the last 8760 hours. No results  for input(s): AMMONIA in the last 8760 hours. CBC:  Recent Labs  01/23/15 0842  01/25/15 0405  02/11/15 0431 02/13/15 1030 02/15/15 0445  WBC 13.4*  < > 8.5  < > 9.2 10.0 9.2  NEUTROABS 10.7*  --  6.0  --   --   --   --   HGB 15.4  < > 12.6*  < > 9.6* 10.1* 9.6*  HCT 45.1  < > 37.2*  < > 29.5* 30.8* 30.1*  MCV 82.4  < > 83.2  < > 84.3 84.2 85.5  PLT 254  < > 204  < > 312 280 284  < > = values in this interval not displayed. Cardiac Enzymes:  Recent Labs  01/23/15 1917 01/24/15 0027 01/26/15 0315  TROPONINI 28.64* 26.20* 5.30*   BNP: Invalid input(s): POCBNP CBG:  Recent Labs  02/14/15 2128 02/15/15 0629 02/15/15 1233  GLUCAP 167* 120* 122*   Lab Results  Component Value Date   HGBA1C 9.9* 01/30/2015    Radiological Exams: Dg Chest Port 1 View  01/23/2015  CLINICAL DATA:  Congestive heart failure EXAM: PORTABLE CHEST 1 VIEW COMPARISON:  None. FINDINGS: There is generalized interstitial edema with patchy alveolar edema in the bases. Heart is enlarged with pulmonary venous hypertension. No adenopathy. IMPRESSION: Evidence of congestive heart failure with edema most pronounced in the lung bases. Electronically Signed   By: Bretta Bang III M.D.   On: 01/23/2015 09:34  Assessment/Plan  Physical deconditioning Will have him work with physical therapy and occupational therapy team to help with gait training and muscle strengthening exercises.fall precautions. Skin care. Encourage to be out of bed. Continue nutritional supplement  CAD S/p CABG. Incision healing well. Chest pain free. Continue aspirin, atorvastatin, losartan   Chest wall soreness Post surgery. Discontinue oxycodone. Start tylenol extra strength 1000 mg q8h prn pain.  CHF Monitor his weight. Continue lasix 40 mg qod, losartan 12.5 mg twice daily, aldactone 25 mg daily and kcl supplement. Monitor bmp  afib Rate controlled. Continue amiodarone, digoxin and eliquis  Insomnia Start  melatonin 3 mg qhs and monitor  Blood loss anemia Post op, s/p transfusion, monitor cbc  HLD Continue atorvastatin  Protein calorie malnutrition Dietary consult. NAS, CCD diet. Continue protein supplement  DM Monitor cbg, a1c reviewed. Continue levemir 20 u daily with SSI.  Constipation Continue colace 200 mg daily and prn lactulose  Will discontinue keflex as no signs of infection noted on exam  Goals of care: short term rehabilitation   Labs/tests ordered: cbc with diff, cmp 02/20/15  Family/ staff Communication: reviewed care plan with patient and nursing supervisor    Oneal Grout, MD  [facility] [phone] (Monday-Friday 8 am - 5 pm) [phone] (afterhours)

## 2015-02-20 ENCOUNTER — Other Ambulatory Visit: Payer: Self-pay | Admitting: Licensed Clinical Social Worker

## 2015-02-20 LAB — HEPATIC FUNCTION PANEL
ALT: 12 U/L (ref 10–40)
AST: 10 U/L — AB (ref 14–40)
Alkaline Phosphatase: 73 U/L (ref 25–125)
Bilirubin, Total: 0.5 mg/dL

## 2015-02-20 LAB — BASIC METABOLIC PANEL
BUN: 16 mg/dL (ref 4–21)
CREATININE: 1.2 mg/dL (ref 0.6–1.3)
GLUCOSE: 98 mg/dL
POTASSIUM: 3.9 mmol/L (ref 3.4–5.3)
Sodium: 142 mmol/L (ref 137–147)

## 2015-02-20 LAB — CBC AND DIFFERENTIAL
HEMATOCRIT: 34 % — AB (ref 41–53)
Hemoglobin: 10.8 g/dL — AB (ref 13.5–17.5)
Platelets: 329 10*3/uL (ref 150–399)
WBC: 6.6 10^3/mL

## 2015-02-20 NOTE — Patient Outreach (Signed)
[facility]) Care Management  02/20/2015  Bradley Benitez Sep 16, 1950 [id number]   Addendum-CSW received voice message from patient's sister in law [place]. Bradley Benitez states that she is trying to figure patient somewhere else to live stating "The house is about to fall apart." Bradley Benitez reports that she has been looking online and is aware that there are no further openings for [employer] Bradley Benitez questions if CSW has any further housing resources. Bradley Benitez agreeable to CSW completing SNF visit on 02/22/15.  CSW completed return call to [facility] but was unable to reach her. CSW left an additional voice message.  [name], BSW, MSW, LCSW [employer] [email or web address] Phone: [phone] Fax: [phone]

## 2015-02-20 NOTE — Patient Outreach (Signed)
[facility]) Care Management  02/20/2015  GLENFORD GARIS Oct 20, 1950 [id number]   Assessment-CSW completed outreach to relative (consent signed) in order to schedule assessment appointment at SNF. CSW unable to reach relative. CSW left a HIPPA compliant voice message encouraging return call.  Plan-CSW will schedule SNF visit for 02/22/15.  [name], BSW, MSW, LCSW [email or web address] Phone: [phone] Fax: [phone]

## 2015-02-21 ENCOUNTER — Other Ambulatory Visit: Payer: Self-pay | Admitting: Licensed Clinical Social Worker

## 2015-02-21 ENCOUNTER — Encounter: Payer: Self-pay | Admitting: Licensed Clinical Social Worker

## 2015-02-21 NOTE — Patient Outreach (Signed)
[facility]) Care Management  02/21/2015  ZAYDAN PAPESH Nov 18, 1950 [id number]   Assessment-CSW received call from SNF discharge planner at [facility]. Discharge planner states that there are no real updates in regards to patient since he has just arrived. CSW informed discharge planner that CSW will be completing SNF visit on 02/22/15.  Plan-CSW will complete assessment on 02/22/15 and assist with all social work and discharge planning needs.  [name], BSW, MSW, LCSW [employer] [email or web address] Phone: [phone] Fax: [phone]

## 2015-02-22 ENCOUNTER — Other Ambulatory Visit: Payer: Self-pay | Admitting: Licensed Clinical Social Worker

## 2015-02-22 NOTE — Patient Outreach (Signed)
[facility]) Care Management  [facility] Social Work  02/22/2015  Bradley Benitez 1950/10/01 [id number]   Current Medications:  Current Outpatient Prescriptions  Medication Sig Dispense Refill  . amiodarone (PACERONE) 200 MG tablet Take 1 tablet (200 mg total) by mouth daily.    Marland Kitchen apixaban (ELIQUIS) 2.5 MG TABS tablet Take 1 tablet (2.5 mg total) by mouth 2 (two) times daily. 60 tablet   . aspirin 81 MG chewable tablet Chew 1 tablet (81 mg total) by mouth daily.    Marland Kitchen atorvastatin (LIPITOR) 80 MG tablet Take 1 tablet (80 mg total) by mouth daily at 6 PM.    . digoxin (LANOXIN) 0.125 MG tablet Take 1 tablet (0.125 mg total) by mouth daily.    Marland Kitchen docusate sodium (COLACE) 100 MG capsule Take 2 capsules (200 mg total) by mouth daily. 10 capsule 0  . feeding supplement, GLUCERNA SHAKE, (GLUCERNA SHAKE) LIQD Take 237 mLs by mouth 3 (three) times daily between meals.  0  . furosemide (LASIX) 40 MG tablet Take 1 tablet (40 mg total) by mouth every other day. 30 tablet   . insulin aspart (NOVOLOG) 100 UNIT/ML injection Inject 0-15 Units into the skin 3 (three) times daily with meals. 10 mL 11  . insulin detemir (LEVEMIR) 100 UNIT/ML injection Inject 0.2 mLs (20 Units total) into the skin daily. 10 mL 11  . lactulose (CHRONULAC) 10 GM/15ML solution Take 30 mLs (20 g total) by mouth daily as needed for mild constipation. 240 mL 0  . losartan (COZAAR) 25 MG tablet Take 0.5 tablets (12.5 mg total) by mouth 2 (two) times daily.    Marland Kitchen oxyCODONE (OXY IR/ROXICODONE) 5 MG immediate release tablet Take 1-2 tablets (5-10 mg total) by mouth every 4 (four) hours as needed for severe pain. 50 tablet 0  . pantoprazole (PROTONIX) 40 MG tablet Take 1 tablet (40 mg total) by mouth daily.    . potassium chloride SA (K-DUR,KLOR-CON) 20 MEQ tablet Take 1 tablet (20 mEq total) by mouth every other day.    . spironolactone (ALDACTONE) 25 MG tablet Take 1 tablet (25 mg total) by mouth daily.     No current  facility-administered medications for this visit.    Functional Status:  In your present state of health, do you have any difficulty performing the following activities: 01/23/2015  Hearing? N  Vision? N  Difficulty concentrating or making decisions? N  Walking or climbing stairs? N  Dressing or bathing? N  Doing errands, shopping? Y    Fall/Depression Screening:  PHQ 2/9 Scores 02/22/2015  PHQ - 2 Score 0    Assessment: CSW completed initial SNF visit at [facility] on 02/22/15. As soon as CSW arrived, patient stated that he needed a nurse immediately. Patient reports that his blood pressure has not been checked all day and that he had not received his insulin in over 18 hours. Nurse informed CSW that patient's blood pressure has been checked and that he was given his insulin this morning. Nurse informed CSW that patient "raised his hand" at another nurse and spoke very aggressive to another nurse this morning. Nurse spoke to patient in regards to his concerns. Patient reports that he has been very unsatisfied with his stay at [facility]. CSW introduced self, reason for visit and of [facility]. Patient agreeable to complete assessment.   Patient is oriented and coherent. Patient reports that before his recent hospitalization "I was fine. I was able to do anything  and everything I wanted." Patient states that he is a vegetarian and eats a low sodium diet. Patient reports that he cooks for himself. 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 Patient has issues affording medications. He denies any depressive symptoms stating "I'm not a sad person. I can get angry about life but I choose not to." Patient reports that he meditates daily. Patient reports that he actively uses deep breathing and biofeedback techniques. Patient shares "I am a very spiritual person." He denies any suicidal or homicidal ideations. Patient has limited  family support. His only stable transportation is his brother and sister in law who have agreed to transport him to any medical appointment needed. CSW educated patient on SCAT services and patient is agreeable to completing SCAT. CSW will fax completed SCAT application once he has been discharged from SNF. Patient reports "I can walk some." Patient has issues with fluid retention. Patient shares that he rather focus on the immediate needs such as his health now than his housing which is a concern for his sister in law. Patient receives $575 per month in social security. Patient applied for Medicaid last year but was denied due to assets. Patient has a car but is unable to drive it. When patient's father passed away, he was given a large amount of money in which he admits he has spent down drastically due to financial issues. Patient reports that he has $9,000 in his savings. He reports that a Child psychotherapist or a case worker informed him that he could transfer this money to a loved one in order to qualify for Medicaid. Patient reports that he gave both his niece and nephew checks for thousands of dollars in order to qualify. CSW educated patient on the Medicaid look back period which is five years. Patient reports that he still wishes to apply online when he discharges from SNF. CSW educated him on Medicaid penalty period. Patient expressed understanding but wishes to have more education. CSW will send information on Medicaid transfer rules to patient's residence. Patient unable to afford a caregiver aide to assist him once he discharges from SNF. Patient has a tub and not a shower which will make it very difficult to bathe. CSW will inform discharge planner of this. Patient denies interest in ALF placement. Patient denied wishing to complete an advance directive.  CSW contacted patient's sister in law Bradley Benitez. Bradley Benitez wishes for CSW to step outside in order to talk freely. Bradley Benitez states that patient current residence  is deplorable sharing "It is a mess. His landlord is his cousin and he has not checked on that house in years. No one wants to go in it." Sister in Social worker used website that CSW provided and found two possible houses that patient can rent that are located in [place] [facility] Bradley Benitez has spoke to patient about these locations and patient reports interest. Bradley Benitez will pursue this further once he has discharged. CSW provided additional housing resources to [place]. Sister in law states that patient's bathroom has major concerns and that she has paid for plumber to go to residence to fix issue. Bradley Benitez reports family conflict with providing care to patient stating "He is very set in his ways. He really doesn't appreciate our help but we still give him what he needs. He sometimes won't do a lot for himself." Occupational psychologist of social work assistance. Contact number provided again.  CSW completed assessment and spoke  to discharge planner at [facility] who will be completing her assessment today. CSW provided all updates gathered during today's assessment. CSW recommended that patient receive a nursing aide in order to help patient with bathing and getting dressed once he returns home. CSW expressed concern to discharge planner in regards to his level of safety for his return back home. Discharge planner shares that she has no updates or concerns for this patient at this time.    Plan: CSW will fax SCAT application once patient discharges from SNF. CSW has contacted RNCM and updated her on case. CSW will send updated information to PCP. CSW will receive updates on patient from sister in law and discharge planner.  [facility] CM Care Plan Problem One        Most Recent Value   Care Plan Problem One  Ongoing health issues that led to SNF admission and psychosocial barriers   Role Documenting the Problem One  Clinical Social Worker   Care Plan for Problem One  Active   [facility] Long Term Goal (31-90 days)  Patient will have a  safe and stable discharge back home from SNF within 90 days.   [facility] Long Term Goal Start Date  02/22/15   Interventions for Problem One Long Term Goal  CSW will coordinate care with patient, family and SNF discharge planner in order to ensure that patient has a safe discharge back home with the support, services and medical equipment that he needs.    [facility] CM Short Term Goal #1 (0-30 days)  Patient will be referred to SCAT within 30 days    [facility] CM Short Term Goal #1 Start Date  02/22/15   Interventions for Short Term Goal #1  CSW will complete SCAT application with patient. Patient will complete assessment. CSW will monitor progress towards reaching stable transportation to medical and non medical appointments.       [name], BSW, MSW, LCSW [email or web address] Phone: [phone] Fax: [phone]

## 2015-02-25 NOTE — Progress Notes (Signed)
Patient ID: Bradley Benitez, male   DOB: [birth year], 65 y.o.   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   Admitted to [facility] the end of December with chest pain. LHC showed 3VD and he underwent CABG. Hospital course was complicated by V fib on 02/05/15 required 9 defibrillations. Loaded on amio. He was not placed on bb due to low output. Placed on eliquis 2.5 mg twice a day per cardiac surgery. Discharge weight 208 pounds.   He returns for post hospital follow up. Complaining of R forearm pain extending to to shoulder. Says it started after he was discharged to SNF. Denies SOB/PND/Orthopnea. Had bleeding from rectum thought to be from hemorrhoids. Refuses stool softner. No further bleeding noted. Participating with PT/OT. Says he is able to walk a little more. Today he was able to dress without difficulty. Weight at the facility 213 pounds. Curretly all meds provided by SNF. All Currently at [facility]. He plans to eventually return home.   LHC/RHC (12/16): RA mean 11 PA 35/11 PCWP mean 23 CI 2.59   Ost RPDA lesion, 70% stenosed.  1st RPLB lesion, 80% stenosed.  Mid Cx lesion, 80% stenosed.  Prox LAD lesion, 60% stenosed.  Mid LAD lesion, 95% stenosed.  Ost 2nd Diag lesion, 70% stenosed.  Ost Ramus to Ramus lesion, 90% stenosed  Cardiac MRI (1/17): 1. Severe LV systolic dysfunction, EF 26%. Wall motion abnormalities as above. 2. Normal RV size and systolic function . 3. LGE pattern suggestive of infarction in LAD territory. The mid anterior/anteroseptal and apical anterior/septal/inferior wall segments and the true apex do not appear viable (probably not likely to improve function with revascularization). The remainder of the left ventricle appears viable. 4. There is a  small LV apical thrombus.  ECHO 20-25% with Apical thombus.  ROS: All systems negative except as listed in HPI, PMH and Problem List.  SH:  Social History   Social History  . Marital Status: Single    Spouse Name: N/A  . Number of Children: N/A  . Years of Education: N/A   Occupational History  . Not on file.   Social History Main Topics  . Smoking status: Former Smoker -- 3.00 packs/day for 20 years    Types: Cigarettes    Quit date: 01/26/1994  . Smokeless tobacco: Not on file  . Alcohol Use: No  . Drug Use: Yes     Comment: Previously smoked marijuana, none in 7 years  . Sexual Activity: Not on file   Other Topics Concern  . Not on file   Social History Narrative    FH:  Family History  Problem Relation Age of Onset  . Diabetes Mother   . Diabetes Father   . Heart failure Brother     Past Medical History  Diagnosis Date  . DM2 (diabetes mellitus, type 2) (HCC)   . Gangrene (HCC)     10+ years ago    Current Outpatient Prescriptions  Medication Sig Dispense Refill  . acetaminophen (TYLENOL) 500 MG tablet Take 1,000 mg by mouth every 8 (eight) hours as needed for mild pain.    . Amino Acids-Protein Hydrolys (FEEDING SUPPLEMENT, PRO-STAT SUGAR FREE 64,) LIQD Take 30 mLs by mouth 3 (three) times daily with meals.    Marland Kitchen  amiodarone (PACERONE) 200 MG tablet Take 1 tablet (200 mg total) by mouth daily.    Marland Kitchen apixaban (ELIQUIS) 2.5 MG TABS tablet Take 1 tablet (2.5 mg total) by mouth 2 (two) times daily. 60 tablet   . aspirin 81 MG chewable tablet Chew 1 tablet (81 mg total) by mouth daily.    Marland Kitchen atorvastatin (LIPITOR) 80 MG tablet Take 1 tablet (80 mg total) by mouth daily at 6 PM.    . digoxin (LANOXIN) 0.125 MG tablet Take 1 tablet (0.125 mg total) by mouth daily.    Marland Kitchen docusate sodium (COLACE) 100 MG capsule Take 2 capsules (200 mg total) by mouth daily. 10 capsule 0  . furosemide (LASIX) 40 MG tablet Take 1 tablet (40 mg total) by mouth every other day. 30 tablet    . insulin detemir (LEVEMIR) 100 UNIT/ML injection Inject 0.2 mLs (20 Units total) into the skin daily. 10 mL 11  . insulin lispro (HUMALOG) 100 UNIT/ML injection Inject 0-10 Units into the skin 3 (three) times daily before meals. Per sliding scale    . lactulose (CHRONULAC) 10 GM/15ML solution Take 30 mLs (20 g total) by mouth daily as needed for mild constipation. 240 mL 0  . losartan (COZAAR) 25 MG tablet Take 0.5 tablets (12.5 mg total) by mouth 2 (two) times daily.    . Melatonin 3 MG TABS Take 3 mg by mouth at bedtime.    . pantoprazole (PROTONIX) 40 MG tablet Take 1 tablet (40 mg total) by mouth daily.    . potassium chloride SA (K-DUR,KLOR-CON) 20 MEQ tablet Take 1 tablet (20 mEq total) by mouth every other day.    . spironolactone (ALDACTONE) 25 MG tablet Take 1 tablet (25 mg total) by mouth daily.     No current facility-administered medications for this encounter.    Filed Vitals:   02/26/15 1426  BP: 90/52  Pulse: 60  Weight: 212 lb (96.163 kg)  SpO2: 99%  Sitting 90/50 Standing 90 /52  PHYSICAL EXAM:  General:  Well appearing. No resp difficulty. Arrived in a wheelchair.  HEENT: normal Neck: supple. JVP flat. Carotids 2+ bilaterally; no bruits. No lymphadenopathy or thryomegaly appreciated. Cor: PMI normal. Regular rate & rhythm. No rubs, gallops or murmurs. Sternal scar Lungs: clear Abdomen: soft, nontender, nondistended. No hepatosplenomegaly. No bruits or masses. Good bowel sounds. Extremities: no cyanosis, clubbing, rash, RLE trace edema Neuro: alert & orientedx3, cranial nerves grossly intact. Moves all 4 extremities w/o difficulty. Affect pleasant.  EKG: Sinus Brady 58 bpm.    ASSESSMENT & PLAN: 1. 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 Will need TSH and yearly eye exams. EKG today Sinus Brady 58 bpm.  2. Chronic  systolic CHF: EF 32% with regional WMAs, ischemic cardiomyopathy. S/P CABG x 4.  -  NYHA II-III. Mild dyspnea with exertion.  Volume status stable. Continue  lasix 40 mg every other day. He is not orthostatic.  No room for BB with soft BP and heart 60.  - Continue dig level 0.125 mg daily. 01/2015 Dig level 0.6  - Continue losartan 12.5 mg daily.  -- Continue spironolactone 25 mg daily.  - Check BMET today.  3. CAD: Diffuse 3 vessel disease, see cath report above. MRI reviewed: LAD territory did not appear to have significant viability. Remainder of LV myocardium did appear viable. He had severe disease in LCx and RCA territory that may benefit from revascularization but suspect LAD territory will not recover.  -  s/p CABG x 4 on 1/6. Suspect ventricular fibrillation arrest 1/10 was scar-mediated as opposed to occlusion of graft, etc. Continue aspirin 81 mg daily + apixaban 2.5 mg twice a day.  - Continue ASA, atorvastatin 80 daily. BP to low for BB 4. LV mural thrombus: Seen on 02/05/2015. Apixaban started 2.5 mg twice a day per cardiac surgery. Cardiac surgery will increase as an outpatient.    5. Diabetes:  Continue insulin. SNF managing.  6.  Diabetic neuropathy: Continue gabapentin 300 mg bid.  7.  R foot/calf pain-  VAS [place] during hospital admit was negative for DVT.    Follow up in  3 weeks with Dr Shirlee Latch. If discharged from facility he will need HH  Sharnelle Cappelli NP-C  2:26 PM

## 2015-02-26 ENCOUNTER — Ambulatory Visit (HOSPITAL_COMMUNITY)
Admit: 2015-02-26 | Discharge: 2015-02-26 | Disposition: A | Discharge status: Home / Self Care | Payer: PPO | Source: Ambulatory Visit | Attending: Internal Medicine | Admitting: Internal Medicine

## 2015-02-26 VITALS — BP 90/52 | HR 60 | Wt 212.0 lb

## 2015-02-26 DIAGNOSIS — Z7902 Long term (current) use of antithrombotics/antiplatelets: Secondary | ICD-10-CM | POA: Diagnosis not present

## 2015-02-26 DIAGNOSIS — Z7982 Long term (current) use of aspirin: Secondary | ICD-10-CM | POA: Insufficient documentation

## 2015-02-26 DIAGNOSIS — E1140 Type 2 diabetes mellitus with diabetic neuropathy, unspecified: Secondary | ICD-10-CM | POA: Diagnosis not present

## 2015-02-26 DIAGNOSIS — Z833 Family history of diabetes mellitus: Secondary | ICD-10-CM | POA: Insufficient documentation

## 2015-02-26 DIAGNOSIS — I4901 Ventricular fibrillation: Secondary | ICD-10-CM | POA: Diagnosis not present

## 2015-02-26 DIAGNOSIS — I5021 Acute systolic (congestive) heart failure: Secondary | ICD-10-CM

## 2015-02-26 DIAGNOSIS — Z79899 Other long term (current) drug therapy: Secondary | ICD-10-CM | POA: Insufficient documentation

## 2015-02-26 DIAGNOSIS — E1142 Type 2 diabetes mellitus with diabetic polyneuropathy: Secondary | ICD-10-CM

## 2015-02-26 DIAGNOSIS — Z8249 Family history of ischemic heart disease and other diseases of the circulatory system: Secondary | ICD-10-CM | POA: Diagnosis not present

## 2015-02-26 DIAGNOSIS — Z794 Long term (current) use of insulin: Secondary | ICD-10-CM | POA: Insufficient documentation

## 2015-02-26 DIAGNOSIS — I255 Ischemic cardiomyopathy: Secondary | ICD-10-CM | POA: Insufficient documentation

## 2015-02-26 DIAGNOSIS — Z87891 Personal history of nicotine dependence: Secondary | ICD-10-CM | POA: Diagnosis not present

## 2015-02-26 DIAGNOSIS — I5022 Chronic systolic (congestive) heart failure: Secondary | ICD-10-CM | POA: Diagnosis not present

## 2015-02-26 DIAGNOSIS — E1165 Type 2 diabetes mellitus with hyperglycemia: Secondary | ICD-10-CM

## 2015-02-26 DIAGNOSIS — I513 Intracardiac thrombosis, not elsewhere classified: Secondary | ICD-10-CM

## 2015-02-26 DIAGNOSIS — Z951 Presence of aortocoronary bypass graft: Secondary | ICD-10-CM | POA: Diagnosis not present

## 2015-02-26 DIAGNOSIS — I2510 Atherosclerotic heart disease of native coronary artery without angina pectoris: Secondary | ICD-10-CM | POA: Diagnosis not present

## 2015-02-26 DIAGNOSIS — D508 Other iron deficiency anemias: Secondary | ICD-10-CM | POA: Diagnosis not present

## 2015-02-26 DIAGNOSIS — I213 ST elevation (STEMI) myocardial infarction of unspecified site: Secondary | ICD-10-CM

## 2015-02-26 LAB — BASIC METABOLIC PANEL
ANION GAP: 7 (ref 5–15)
BUN: 18 mg/dL (ref 6–20)
CALCIUM: 9.2 mg/dL (ref 8.9–10.3)
CHLORIDE: 106 mmol/L (ref 101–111)
CO2: 28 mmol/L (ref 22–32)
Creatinine, Ser: 1.14 mg/dL (ref 0.61–1.24)
GFR calc Af Amer: >60 mL/min (ref >60)
GFR calc non Af Amer: >60 mL/min (ref >60)
GLUCOSE: 224 mg/dL — AB (ref 65–99)
POTASSIUM: 4.1 mmol/L (ref 3.5–5.1)
Sodium: 141 mmol/L (ref 135–145)

## 2015-02-26 LAB — CBC AND DIFFERENTIAL
HCT: 35 % — AB (ref 41–53)
Hemoglobin: 11.4 g/dL — AB (ref 13.5–17.5)
Platelets: 260 10*3/uL (ref 150–399)
WBC: 5.8 10*3/mL

## 2015-02-26 NOTE — Patient Instructions (Signed)
Labs today  Your physician recommends that you schedule a follow-up appointment in: 2 weeks  

## 2015-02-26 NOTE — Progress Notes (Signed)
Advanced Heart Failure Medication Review by a Pharmacist  Does the patient  feel that his/her medications are working for him/her?  yes  Has the patient been experiencing any side effects to the medications prescribed?  no  Does the patient measure his/her own blood pressure or blood glucose at home?  yes   Does the patient have any problems obtaining medications due to transportation or finances?   no  Understanding of regimen: good Understanding of indications: good Potential of compliance: excellent Patient understands to avoid NSAIDs. Patient understands to avoid decongestants.  Issues to address at subsequent visits: None   Pharmacist comments:  Bradley Benitez is a pleasant 65 yo M presenting from [facility] with an up-to-date MAR. His list was reconciled and no significant discrepancies were noted.   Tyler Deis. Bonnye Fava, PharmD, BCPS, CPP Clinical Pharmacist Pager: [phone] Phone: [phone] 02/26/2015 2:23 PM      Time with patient: 4 minutes Preparation and documentation time: 8 minutes Total time: 12 minutes

## 2015-02-27 ENCOUNTER — Telehealth (HOSPITAL_COMMUNITY): Payer: Self-pay | Admitting: *Deleted

## 2015-02-27 DIAGNOSIS — M6281 Muscle weakness (generalized): Secondary | ICD-10-CM | POA: Diagnosis not present

## 2015-02-27 DIAGNOSIS — I5030 Unspecified diastolic (congestive) heart failure: Secondary | ICD-10-CM | POA: Diagnosis not present

## 2015-02-27 DIAGNOSIS — R278 Other lack of coordination: Secondary | ICD-10-CM | POA: Diagnosis not present

## 2015-02-27 DIAGNOSIS — I214 Non-ST elevation (NSTEMI) myocardial infarction: Secondary | ICD-10-CM | POA: Diagnosis not present

## 2015-02-27 DIAGNOSIS — R2681 Unsteadiness on feet: Secondary | ICD-10-CM | POA: Diagnosis not present

## 2015-02-27 DIAGNOSIS — Z48812 Encounter for surgical aftercare following surgery on the circulatory system: Secondary | ICD-10-CM | POA: Diagnosis not present

## 2015-02-27 DIAGNOSIS — L0390 Cellulitis, unspecified: Secondary | ICD-10-CM | POA: Diagnosis not present

## 2015-02-27 MED ORDER — GABAPENTIN 300 MG PO CAPS: 300.0000 mg | ORAL_CAPSULE | Freq: Every day | ORAL | Status: DC

## 2015-02-27 NOTE — Telephone Encounter (Signed)
Kenney Houseman called and stated pt told her we were suppose to order him a pain pill at his appt yesterday but there was nothing ordered on his paperwork he brought back.  Reviewed pt's chart only med that is mentioned for pain is: 6. Diabetic neuropathy: Continue gabapentin 300 mg bid. Although it is not on pt's med list.  Kenney Houseman stated they have no orders for Gabapentin and pt has not been on that med since he has been at that facility.  Discussed w/Amy Filbert Schilder, NP she states pt was on med in the hospital and it should of been continued, however since pt has been on it she recommends starting pt on 300 mg daily.  Order faxed to Kenney Houseman at [phone], med list updated

## 2015-03-01 ENCOUNTER — Other Ambulatory Visit: Payer: Self-pay | Admitting: Licensed Clinical Social Worker

## 2015-03-01 NOTE — Patient Outreach (Signed)
[facility]) Care Management  03/01/2015  LOREN SAWAYA 08/12/1950 [id number]   Assessment-CSW received call from patient's nephew Christianjames Soule. Aurther Loft was with patient at [facility]. Patient provided verbal consent for CSW to discuss patient's care with nephew. Nephew has questions in regards to [employer]. CSW contacted Marylene Land from [employer] and left a HIPPA compliant voice message. Patient is expected to discharge from SNF on 03/06/15 or 03/07/15. Nephew reports that patient does not feel comfortable cooking for himself. Patient is a vegetarian. Patient desires a 30 day service of [employer]. CSW informed nephew that SCAT application had been completed and it will be submitted once patient discharges from SNF. Nephew had questions in regards to program. CSW explained [facility] services. Nephew lives in [place], [place] and is very close to patient as patient has informed CSW previously. Nephew reports that he has been to patient's residence and has "cleaned it up and got things ready for him to come home." Nephew had a question in regards to patient being able to test his blood sugar. Nephew states that staff has not educated him on how to do so and that he does not have equipment. CSW urged nephew to discuss these concerns with SNF staff. Nephew has CSW's contact information.   Plan-CSW will wait call from [facility] from [employer]. CSW will update RNCM. CSW will continue to assist patient in planning for a safe and stable discharge back home.  [name], BSW, MSW, LCSW [email or web address] Phone: [phone] Fax: [phone]

## 2015-03-04 ENCOUNTER — Non-Acute Institutional Stay (SKILLED_NURSING_FACILITY): Payer: PPO | Admitting: Nurse Practitioner

## 2015-03-04 ENCOUNTER — Other Ambulatory Visit: Payer: Self-pay | Admitting: Licensed Clinical Social Worker

## 2015-03-04 ENCOUNTER — Other Ambulatory Visit: Payer: Self-pay

## 2015-03-04 ENCOUNTER — Encounter: Payer: Self-pay | Admitting: Nurse Practitioner

## 2015-03-04 DIAGNOSIS — I4891 Unspecified atrial fibrillation: Secondary | ICD-10-CM

## 2015-03-04 DIAGNOSIS — M5489 Other dorsalgia: Secondary | ICD-10-CM | POA: Diagnosis not present

## 2015-03-04 DIAGNOSIS — E785 Hyperlipidemia, unspecified: Secondary | ICD-10-CM

## 2015-03-04 DIAGNOSIS — G8912 Acute post-thoracotomy pain: Secondary | ICD-10-CM | POA: Diagnosis not present

## 2015-03-04 DIAGNOSIS — I214 Non-ST elevation (NSTEMI) myocardial infarction: Secondary | ICD-10-CM

## 2015-03-04 DIAGNOSIS — M542 Cervicalgia: Secondary | ICD-10-CM | POA: Diagnosis not present

## 2015-03-04 DIAGNOSIS — D62 Acute posthemorrhagic anemia: Secondary | ICD-10-CM | POA: Diagnosis not present

## 2015-03-04 DIAGNOSIS — E118 Type 2 diabetes mellitus with unspecified complications: Secondary | ICD-10-CM | POA: Diagnosis not present

## 2015-03-04 DIAGNOSIS — I110 Hypertensive heart disease with heart failure: Secondary | ICD-10-CM

## 2015-03-04 DIAGNOSIS — R0789 Other chest pain: Secondary | ICD-10-CM

## 2015-03-04 DIAGNOSIS — M546 Pain in thoracic spine: Secondary | ICD-10-CM | POA: Diagnosis not present

## 2015-03-04 DIAGNOSIS — G8918 Other acute postprocedural pain: Secondary | ICD-10-CM

## 2015-03-04 NOTE — Patient Outreach (Signed)
Unsuccessful attempt made to contact patient via telephone to number [phone] and [phone].  HIPPA compliant message left for patient at both numbers.  Will make attempt #2 to contact patient on 2/07

## 2015-03-04 NOTE — Patient Outreach (Signed)
[facility]) Care Management  03/04/2015  DIMITRIY CARRERAS 1950-12-19 [id number]   Assessment-CSW received call back from [employer]. They are unable to accommodate a vegetarian meal. CSW contacted patient and patient repots that he will still like to receive meals and that he will just not eat the meat. CSW contacted nephew to update him but was unable to reach him. HIPPA voice message left.  Plan-CSW will notify RNCM of upcoming SNF discharge on 03/07/15.  [name], BSW, MSW, LCSW [email or web address] Phone: [phone] Fax: [phone]

## 2015-03-04 NOTE — Patient Outreach (Signed)
[facility]) Care Management  03/04/2015  Bradley Benitez February 17, 1950 [id number]   Assessment-CSW received incoming call from patient's nephew Bradley Benitez on 03/04/15. Verbal consent received from patient. Nephew has a few questions for CSW in regards to patient's care. Nephew needs to know what time to pick patient up from facility on 03/07/15. CSW suggested that nephew contact discharge planner to find out. Nephew had questions in regards to [employer] program and CSW provided education. Nephew also had questions in regards to SCAT program which CSW provided as well. Nephew provided CSW with patient's new cell phone number which is [phone].  CSW completed second outreach to [employer] and left an additional voice message.CSW also completed outreach to [facility]. Discharge planner states that she has no updates at this time because they have just become aware themselves of patient's discharge date this morning (last date that insurance will cover.) CSW provided contact number. Discharge planner agreeable to contact CSW with discharge updates.  Plan-CSW will await to hear back from [employer] and SNF discharge planner. CSW will fax completed SCAT application on 03/08/15.  [name], BSW, MSW, LCSW [email or web address] Phone: [phone] Fax: [phone]

## 2015-03-04 NOTE — Progress Notes (Signed)
Patient ID: Bradley Benitez, male   DOB: 11/20/1950, 65 y.o.   MRN: [id number]    Nursing Home Location:  [facility]   Place of Service: SNF (31)  PCP: Lisabeth Pick, MD  Allergies  Allergen Reactions  . Codeine Other (See Comments)    intolerance    Chief Complaint  Patient presents with  . Discharge Note    discharge from facility    HPI:  Patient is a 65 y.o. male seen today at [facility] for discharge home. patient at [facility] for short term rehabilitation after hospitalization from 01/23/15-02/15/15 with acute hypoxic respiratory failure in setting of acute systolic CHF in setting of non-STEMI. EF 25-30% with large anterior wall motion abnormality and an apical thrombus. He underwent cardiac catheterization and was noted to have severe multivessel coronary artery disease. Cardiothoracic surgery was consulted. He underwent CABG X 4, endoscopic harvest of right greater saphenous vein. While hospitalized pt had ventricular fibrillation and required 9 rounds of defibrillation. He was started on apixaban. Ongoing chest soreness but has improved. No chest pains noted at this time. Pt with ongoing numbness to lateral fingers and lateral side of right arm with pain in right upper back near shoulder. This is new from hospitalization and has not improved.  Review of Systems:  Review of Systems  Constitutional: Negative for activity change, appetite change, fatigue and unexpected weight change.  HENT: Negative for congestion and hearing loss.   Eyes: Negative.   Respiratory: Negative for cough and shortness of breath.   Cardiovascular: Negative for chest pain, palpitations and leg swelling.  Gastrointestinal: Negative for abdominal pain, diarrhea and constipation.  Genitourinary: Negative for dysuria and difficulty urinating.  Musculoskeletal: Negative for myalgias and arthralgias.  Skin: Negative for color change and wound.  Neurological: Positive for  numbness (to right 4th and 5th finger). Negative for dizziness and weakness.  Psychiatric/Behavioral: Negative for behavioral problems, confusion and agitation.    Past Medical History  Diagnosis Date  . DM2 (diabetes mellitus, type 2) (HCC)   . Gangrene (HCC)     10+ years ago   Past Surgical History  Procedure Laterality Date  . Knee surgery Left   . Cardiac catheterization N/A 01/25/2015    Procedure: Right/Left Heart Cath and Coronary Angiography;  Surgeon: Tonny Bollman, MD;  Location: [facility] INVASIVE CV LAB;  Service: Cardiovascular;  Laterality: N/A;  . Coronary artery bypass graft N/A 02/01/2015    Procedure: CORONARY ARTERY BYPASS GRAFTING (CABG)x 4 using left internal mammary artery and right greater saphenous leg vein using endoscope.;  Surgeon: [name], MD;  Location: [facility] OR;  Service: Open Heart Surgery;  Laterality: N/A;  . Tee without cardioversion N/A 02/01/2015    Procedure: TRANSESOPHAGEAL ECHOCARDIOGRAM (TEE);  Surgeon: [name], MD;  Location: [facility] OR;  Service: Open Heart Surgery;  Laterality: N/A;  . Placement of centrimag ventricular assist device N/A 02/01/2015    Procedure:  CENTRIMAG VENTRICULAR ASSIST DEVICE, back up;  Surgeon: [name], MD;  Location: [facility] OR;  Service: Open Heart Surgery;  Laterality: N/A;   Social History:   reports that he quit smoking about 21 years ago. His smoking use included Cigarettes. He has a 60 pack-year smoking history. He does not have any smokeless tobacco history on file. He reports that he uses illicit drugs. He reports that he does not drink alcohol.  Family History  Problem Relation Age of Onset  . Diabetes Mother   .  Diabetes Father   . Heart failure Brother     Medications: Patient's Medications  New Prescriptions   No medications on file  Previous Medications   ACETAMINOPHEN (TYLENOL) 500 MG TABLET    Take 1,000 mg by mouth every 8 (eight) hours as needed for mild pain.   AMINO ACIDS-PROTEIN HYDROLYS  (FEEDING SUPPLEMENT, PRO-STAT SUGAR FREE 64,) LIQD    Take 30 mLs by mouth 3 (three) times daily with meals.   AMIODARONE (PACERONE) 200 MG TABLET    Take 1 tablet (200 mg total) by mouth daily.   APIXABAN (ELIQUIS) 2.5 MG TABS TABLET    Take 1 tablet (2.5 mg total) by mouth 2 (two) times daily.   ASPIRIN 81 MG CHEWABLE TABLET    Chew 1 tablet (81 mg total) by mouth daily.   ATORVASTATIN (LIPITOR) 80 MG TABLET    Take 1 tablet (80 mg total) by mouth daily at 6 PM.   DIGOXIN (LANOXIN) 0.125 MG TABLET    Take 1 tablet (0.125 mg total) by mouth daily.   DOCUSATE SODIUM (COLACE) 100 MG CAPSULE    Take 2 capsules (200 mg total) by mouth daily.   FUROSEMIDE (LASIX) 40 MG TABLET    Take 1 tablet (40 mg total) by mouth every other day.   GABAPENTIN (NEURONTIN) 300 MG CAPSULE    Take 1 capsule (300 mg total) by mouth daily.   INSULIN DETEMIR (LEVEMIR) 100 UNIT/ML INJECTION    Inject 0.2 mLs (20 Units total) into the skin daily.   INSULIN LISPRO (HUMALOG) 100 UNIT/ML INJECTION    Inject 0-10 Units into the skin 3 (three) times daily before meals. Per sliding scale   LACTULOSE (CHRONULAC) 10 GM/15ML SOLUTION    Take 30 mLs (20 g total) by mouth daily as needed for mild constipation.   LIDOCAINE (LIDODERM) 5 %    Apply topically to right shoulder/back daily at 8 pm and remove at 8 am   LOSARTAN (COZAAR) 25 MG TABLET    Take 0.5 tablets (12.5 mg total) by mouth 2 (two) times daily.   MELATONIN 3 MG TABS    Take 3 mg by mouth at bedtime.   MENTHOL, TOPICAL ANALGESIC, (BIOFREEZE) 4 % GEL    Apply to right arm topically every 8 hours for pain.   PANTOPRAZOLE (PROTONIX) 40 MG TABLET    Take 1 tablet (40 mg total) by mouth daily.   POTASSIUM CHLORIDE SA (K-DUR,KLOR-CON) 20 MEQ TABLET    Take 1 tablet (20 mEq total) by mouth every other day.   SPIRONOLACTONE (ALDACTONE) 25 MG TABLET    Take 1 tablet (25 mg total) by mouth daily.  Modified Medications   No medications on file  Discontinued Medications   No  medications on file     Physical Exam: Filed Vitals:   03/04/15 1136  BP: 133/69  Pulse: 60  Temp: 99.1 F (37.3 C)  Resp: 17  Height: 6\' 1"  (1.854 m)  Weight: 211 lb 3.2 oz (95.8 kg)    Physical Exam  Constitutional: He is oriented to person, place, and time. He appears well-developed and well-nourished. No distress.  HENT:  Head: Normocephalic and atraumatic.  Mouth/Throat: Oropharynx is clear and moist. No oropharyngeal exudate.  Eyes: Conjunctivae and EOM are normal. Pupils are equal, round, and reactive to light.  Neck: Normal range of motion. Neck supple.  Cardiovascular: Normal rate, regular rhythm and normal heart sounds.   Pulmonary/Chest: Effort normal and breath sounds normal.  Abdominal: Soft. Bowel  sounds are normal.  Musculoskeletal: Normal range of motion. He exhibits no edema or tenderness.  Tenderness to paraspinal muscle along upper thoracic spine. Grip strength normal bilaterally.   Neurological: He is alert and oriented to person, place, and time.  Skin: Skin is warm and dry. He is not diaphoretic.  Well healing chest incision   Psychiatric: He has a normal mood and affect.    Labs reviewed: Basic Metabolic Panel:  Recent Labs  16/10/96 0027  02/02/15 0446 02/02/15 1700  02/05/15 0722  02/14/15 0438 02/15/15 0445 02/20/15 02/26/15 1503  NA 141  < > 139  --   < >  --   < > 137 137 142 141  K 3.3*  < > 4.5  --   < >  --   < > 4.0 3.9 3.9 4.1  CL 107  < > 110  --   < >  --   < > 101 104  --  106  CO2 24  < > 24  --   < >  --   < > 29 27  --  28  GLUCOSE 159*  < > 106*  --   < >  --   < > 111* 126*  --  224*  BUN 15  < > 15  --   < >  --   < > CREATININE 1.30*  < > 1.21 1.54*  < >  --   < > 1.40* 1.31* 1.2 1.14  CALCIUM 8.5*  < > 8.1*  --   < >  --   < > 8.7* 8.3*  --  9.2  MG 1.7  < > 2.6* 2.4  --  2.5*  --   --   --   --   --   PHOS 3.9  --   --   --   --   --   --   --   --   --   --   < > = values in this interval not  displayed. Liver Function Tests:  Recent Labs  01/23/15 0842 01/26/15 1115 02/20/15  AST 21 28 10*  ALT ALKPHOS 72 64 73  BILITOT 0.8 0.5  --   PROT 7.0 6.6  --   ALBUMIN 3.9 3.3*  --    No results for input(s): LIPASE, AMYLASE in the last 8760 hours. No results for input(s): AMMONIA in the last 8760 hours. CBC:  Recent Labs  01/23/15 0842  01/25/15 0405  02/11/15 0431 02/13/15 1030 02/15/15 0445 02/20/15 02/26/15  WBC 13.4*  < > 8.5  < > 9.2 10.0 9.2 6.6 5.8  NEUTROABS 10.7*  --  6.0  --   --   --   --   --   --   HGB 15.4  < > 12.6*  < > 9.6* 10.1* 9.6* 10.8* 11.4*  HCT 45.1  < > 37.2*  < > 29.5* 30.8* 30.1* 34* 35*  MCV 82.4  < > 83.2  < > 84.3 84.2 85.5  --   --   PLT 254  < > 204  < > 312 280 284 329 260  < > = values in this interval not displayed. TSH:  Recent Labs  01/23/15 1410  TSH 1.311   A1C: Lab Results  Component Value Date   HGBA1C 9.9* 01/30/2015   Lipid Panel:  Recent Labs  01/24/15 0027  CHOL 157  HDL 34*  LDLCALC 110*  TRIG 66  CHOLHDL 4.6    Radiological Exams: Dg Chest 2 View  02/10/2015  CLINICAL DATA:  Acute respiratory failure with hypoxemia. Congestive heart failure. Poorly controlled diabetes with diabetic neuropathy. EXAM: CHEST  2 VIEW COMPARISON:  02/09/2015 FINDINGS: Right arm PICC line in right subclavian central venous catheter remain in appropriate position. Heart size is normal. Prior CABG again noted. Bibasilar atelectasis or infiltrates show no significant change. Probable tiny bilateral pleural effusions again noted. IMPRESSION: No significant change in bibasilar atelectasis or infiltrates and probable tiny bilateral pleural effusions. Electronically Signed   By: Myles Rosenthal M.D.   On: 02/10/2015 08:57   Dg Chest 2 View  02/05/2015  CLINICAL DATA:  Shortness of breath, status post CABG 4 days ago. EXAM: CHEST  2 VIEW COMPARISON:  PA and lateral chest x-ray of February 04, 2015 FINDINGS: The left lung is  well-expanded. There is minimal basilar atelectasis medially. On the right a small amount of pleural fluid remains. The cardiac silhouette is mildly enlarged. The pulmonary vascularity is not engorged. There is no pneumothorax or pneumomediastinum. There are 7 intact sternal wires. The right-sided PICC line tip projects over the junction of the distal SVC with the right atrium. IMPRESSION: Further interval improvement in pulmonary interstitial edema. Persistent left basilar subsegmental atelectasis and persistent trace right pleural effusion. Electronically Signed   By: [place]  [place] M.D.   On: 02/05/2015 07:53   Dg Chest 2 View  02/04/2015  CLINICAL DATA:  Acute respiratory failure, CHF, previous MI, former smoker. EXAM: CHEST  2 VIEW COMPARISON:  Portable chest x-ray of February 03, 2015 FINDINGS: The lungs are borderline hypoinflated. There is a trace of pleural fluid on the right. Right basilar atelectasis or pneumonia has improved. There remains retrocardiac subsegmental atelectasis but it has improved as well. The cardiac silhouette remains enlarged. The pulmonary vascularity is less engorged. There are post CABG changes. The right-sided PICC line tip projects over the distal third of the SVC. The right internal jugular Cordis sheath has been removed. IMPRESSION: Mild interval improvement in bibasilar atelectasis or pneumonia. Trace right pleural effusion remains. Stable mild cardiomegaly with decreased pulmonary vascular congestion. Electronically Signed   By: Breylan  Swaziland M.D.   On: 02/04/2015 08:04   Dg Chest Port 1 View  02/09/2015  CLINICAL DATA:  Short of breath EXAM: PORTABLE CHEST 1 VIEW COMPARISON:  02/07/2015 FINDINGS: Normal heart size. Lungs under aerated with bibasilar hypoaeration. Small pleural effusions may be present. No pneumothorax. Right subclavian central venous catheter tip at the cavoatrial junction. IMPRESSION: Bibasilar atelectasis Small pleural effusions may be present.  Electronically Signed   By: Jolaine Click M.D.   On: 02/09/2015 10:32   Dg Chest Port 1 View  02/07/2015  CLINICAL DATA:  CABG EXAM: PORTABLE CHEST 1 VIEW COMPARISON:  02/06/2015 FINDINGS: Right PICC and right subclavian central venous catheter are stable. Normal heart size. Low volumes. Bibasilar atelectasis. Vascular congestion has resolved. Small pleural effusions are suspected. IMPRESSION: Resolved vascular congestion. Bibasilar atelectasis. Small pleural effusions persist. Electronically Signed   By: Jolaine Click M.D.   On: 02/07/2015 07:36   Dg Chest Port 1 View  02/06/2015  CLINICAL DATA:  CABG. EXAM: PORTABLE CHEST 1 VIEW COMPARISON:  02/05/2015. FINDINGS: Right PICC line in stable position. Prior CABG. Cardiomegaly with pulmonary venous congestion and interstitial prominence, no change from prior exam. Findings consistent with stable changes of congestive heart failure. Small bilateral pleural effusions. No pneumothorax. IMPRESSION:  1. Right PICC line in stable position. 2. Prior CABG. Persistent changes of congestive heart failure with mild pulmonary interstitial edema and small pleural effusions. Electronically Signed   By: Maisie Fus  Register   On: 02/06/2015 07:20   Dg Chest Port 1 View  02/05/2015  CLINICAL DATA:  Code. EXAM: PORTABLE CHEST 1 VIEW COMPARISON:  Earlier today at 627 hours.  One day prior. FINDINGS: 741 hours. Extensive artifact degradation due to overlying support apparatus, external pacer. A right sided PICC line terminates at the low SVC. Patient rotated left. Prior median sternotomy. Cardiomegaly accentuated by AP portable technique. Left costophrenic angle excluded. Probable layering right pleural effusion. No pneumothorax. Low lung volumes, accentuating interstitial edema. Developing right base airspace disease. IMPRESSION: Developing congestive heart failure. Probable small right pleural effusion with adjacent right base atelectasis. Extensive artifact degradation.  Electronically Signed   By: Jeronimo Greaves M.D.   On: 02/05/2015 07:50   Dg Chest Port 1 View  02/03/2015  CLINICAL DATA:  Atelectasis EXAM: PORTABLE CHEST 1 VIEW COMPARISON:  Chest x-rays dated 02/02/2015 and 02/01/2015. FINDINGS: Left-sided chest tube is no longer seen. Swan-Ganz catheter is also no longer seen. Mediastinal drains have also been removed. Right IJ central line access site remains with tip projected over the upper SVC. Right-sided PICC line appears well positioned with tip projected over the lower SVC. Cardiomediastinal silhouette is stable in size and configuration. Cardiomegaly is stable. Median sternotomy wires are intact and stable in alignment, presumably for CABG. There is mild central pulmonary vascular congestion. Ill-defined bibasilar airspace opacities are not not significantly changed, presumably atelectasis and/or small effusions. No new lung findings. No pneumothorax seen. IMPRESSION: 1. Multiple tubes and lines removed, as above. 2. Persistent mild central pulmonary vascular congestion without evidence of overt pulmonary edema. 3. Persistent ill-defined bibasilar airspace opacities, presumably atelectasis and/or small effusions. 4. No new lung findings. 5. Stable cardiomegaly. Electronically Signed   By: Bary Richard M.D.   On: 02/03/2015 08:02    Assessment/Plan 1. NSTEMI (non-ST elevated myocardial infarction) (HCC) S/p CABG, chest pain free at this time with some soreness to chest wall. Incisions healing well. conts on ASA, losartan and atorvastatin.   2. Hypertensive heart disease with congestive heart failure (HCC) Stable, Continue lasix 40 mg qod, losartan 12.5 mg twice daily, aldactone 25 mg daily and kcl supplement.   3. Dyslipidemia conts on atorvastatin 80 mg daily   4. Chest wall pain following surgery Pain controlled on tylenol.   5. Atrial fibrillation, unspecified type (HCC) Rate controlled, conts on amiodarone, digoxin for rate and eliquis for  anticoagulation   6. Type 2 diabetes mellitus with complication, unspecified long term insulin use status (HCC) Blood sugars variable, will stop SSI on discharge and cont levemir 20 units, pt to cont to take blood sugars twice daily and follow up with PCP  7. Acute blood loss anemia hgb stable and slowly improving. PCP to monitor   8. back pain with numbess and tingling to right arm Pt noted with ongoing pain to paraspinal muscles on  thoracic spine with pain radiating down right arm and numbness to 4th and 5th digit. Normal strength bilaterally and normal ROM Pain is new from hospitalization and has not gotten better. Reports biofreeze and lidocaine patch helps. Pt has also been placed on gabapentin. Will refer to neurology at this time for further evaluation with possible EMG and to rule out nerve impingement and   Dontee Jaso K. Biagio Borg  [facility] [phone] 8 am - 5 pm) [phone] (after hours)

## 2015-03-04 NOTE — Patient Outreach (Signed)
Unsuccessful attempt made to contact patient at [phone] and [phone].  HIPPA compliant message left at both numbers.  Plan: Make another attempt on tomorrow, February 7

## 2015-03-05 ENCOUNTER — Other Ambulatory Visit: Payer: Self-pay | Admitting: Licensed Clinical Social Worker

## 2015-03-05 ENCOUNTER — Other Ambulatory Visit: Payer: Self-pay

## 2015-03-05 NOTE — Patient Outreach (Signed)
[facility]) Care Management  03/05/2015  Bradley Benitez [birth year] [id number]   Assessment-CSW received call from patient's nephew. Nephew reports that he is disappointed [employer] does not accommodate vegetarians but it would "be better than nothing and it's free." Bradley Benitez has some concerns for patient's discharge on 03/07/15 as patient is in need of a working cane (one at his residence does not work.) Bradley Benitez reports that he has been to patient's home daily trying to set it up for patient to return home from SNF. Bradley Benitez was able to reach SNF discharge planner and received details on what time he will need to pick up patient from SNF on 03/07/15. CSW and RNCM have discussed updates on on patient.   CSW completed outreach to SNF discharge planner. Discharge planner states that patient will return home with prescriptions, physical and occupational therapy, RN and CNA to assist with bathing. Discharge planner reports that therapy did not recommend any medical equipment. Insurance will not cover for patient to get a cane.   Plan-CSW will continue to ensure that patient has a safe and stable discharge back home form SNF on 03/07/15.  [name], BSW, MSW, LCSW [email or web address] Phone: [phone] Fax: [phone]

## 2015-03-06 ENCOUNTER — Telehealth: Payer: Self-pay | Admitting: Cardiology

## 2015-03-06 NOTE — Telephone Encounter (Signed)
Pt has a 30 free card for Eliquis but pharmacist says he needs written rx in order to use it-pls call [phone] cell number

## 2015-03-06 NOTE — Telephone Encounter (Signed)
Patient st he will need a written Rx for Eliquis for the 30 day card. He st he will also need Dr. Alford Highland nurse to fill out paperwork for him to receive medication after the free 30 days are up. Informed patient that Dr. Alford Highland nurse will not be in the office until Friday. He st he will call back Friday morning to speak with her.

## 2015-03-07 ENCOUNTER — Other Ambulatory Visit: Payer: Self-pay | Admitting: Licensed Clinical Social Worker

## 2015-03-07 DIAGNOSIS — I110 Hypertensive heart disease with heart failure: Secondary | ICD-10-CM

## 2015-03-07 NOTE — Patient Outreach (Signed)
[facility]) Care Management  03/07/2015  Bradley Benitez May 21, 1950 [id number]   Assessment-CSW received incoming call from patient's nephew on 03/07/15. Verbal consent provided by patient on 03/01/15. Bradley Benitez reports that he is on his way to pick up patient from SNF. CSW provided updates to patient's nephew in regards to his discharge planning and home health. Nephew has concerns for patient being able to afford medications and confusion on how to take new medications. CSW will make referral to pharmacy at this time.  Plan-CSW will make referral to pharmacy. CSW will fax completed SCAT application on 03/08/15. Patient Mobile Meals will start 03/08/15.  [name], BSW, MSW, LCSW [email or web address] Phone: [phone] Fax: [phone]

## 2015-03-08 ENCOUNTER — Other Ambulatory Visit (HOSPITAL_COMMUNITY): Payer: Self-pay

## 2015-03-08 ENCOUNTER — Telehealth (HOSPITAL_COMMUNITY): Payer: Self-pay | Admitting: Pharmacist

## 2015-03-08 ENCOUNTER — Other Ambulatory Visit (HOSPITAL_COMMUNITY): Payer: Self-pay | Admitting: Pharmacist

## 2015-03-08 ENCOUNTER — Other Ambulatory Visit: Payer: Self-pay

## 2015-03-08 DIAGNOSIS — I25700 Atherosclerosis of coronary artery bypass graft(s), unspecified, with unstable angina pectoris: Secondary | ICD-10-CM

## 2015-03-08 MED ORDER — APIXABAN 2.5 MG PO TABS: 2.5000 mg | ORAL_TABLET | Freq: Two times a day (BID) | ORAL | Status: DC

## 2015-03-08 NOTE — Telephone Encounter (Signed)
Patient's niece arrived in clinic asking about Eliquis patient assistance. I have advised her that he will need to have spent 3% of his annual income on out-of-pocket prescription costs before he is eligible for assistance through the BMS program. I have also sent a Rx for his Eliquis to [facility] and provided a 30-day free card so that he can at least get through the next 30 days.   Tyler Deis. Bonnye Fava, PharmD, BCPS, CPP Clinical Pharmacist Pager: [phone] Phone: [phone] 03/08/2015 12:33 PM

## 2015-03-08 NOTE — Patient Outreach (Signed)
This RNCM was successful after several attempts to reach patietn via telephone. Patient and this RNCM made an appointment for home visit on Monday, February 13.  Patient states he was concern he will not be able to afford all his medications. Patient states he will  Have a better ideal on Monday during our home visit.  Plan: Home visit on Monday, February 13 Referral made to [facility] for education, assessment of need for pharmaceutical assistance.

## 2015-03-11 ENCOUNTER — Other Ambulatory Visit: Payer: Self-pay

## 2015-03-11 ENCOUNTER — Other Ambulatory Visit: Payer: Self-pay | Admitting: *Deleted

## 2015-03-11 ENCOUNTER — Other Ambulatory Visit: Payer: Self-pay | Admitting: Licensed Clinical Social Worker

## 2015-03-11 DIAGNOSIS — Z951 Presence of aortocoronary bypass graft: Secondary | ICD-10-CM

## 2015-03-11 NOTE — Patient Outreach (Signed)
[facility]) Care Management  03/11/2015  Bradley Benitez 17-Nov-1950 [id number]    Assessment- CSW received incoming call from patient. Patient reports that he is feeling very overwhelmed. He stated that he was informed at SNF that his medications would be taken care of. However, he spent $285 on his medications last week. Patient reports "I am barely able to pay my bills." Patient in need of pharmacy assistance. CSW made referral to pharmacy on 03/04/15. CSW will update pharmacy. Patient also reports confusion on how to use medical equipment. He states that his feet and legs were swelling so much over the weekend that he could not walk. RNCM has home visit scheduled for today. CSW will update RNCM. Patient reports receiving Mobile Me[employer] on Friday. Patient is a vegetarian and is unable to eat the meat in the meal but reports he was able to eat a salad and vegetable.   Plan-CSW will update [facility] pharmacy and nursing. CSW will continue to provide social work assistance and link patient to community resources.  [name], BSW, MSW, LCSW [email or web address] Phone: [phone] Fax: [phone]

## 2015-03-11 NOTE — Patient Outreach (Signed)
[facility]) Care Management  03/11/2015  Bradley Benitez 05-May-1950 [id number]   Assessment-CSW received call from patient. Patient is in need of transportation to his upcoming follow up surgery appointment. Patient will need to have chest X ray (at the same building) 30 minutes before appointment. CSW will assit patient with gaining stable transportation to this appointment. SCAT appointment pending. CSW contacted MD's office to gather appointment details.  Plan-CSW will make request to Care Management Assistant Damita Rhodie to arrange transportation to appointment through My [employer]oint[employer].   [name], BSW, MSW, LCSW [email or web address] Phone: [phone] Fax: [phone]

## 2015-03-11 NOTE — Patient Outreach (Signed)
[facility]) Care Management  03/11/2015  ATHA MCBAIN [birth year] [id number]   Request from [name], LCSW to arrange transportation to patient's appointment on 03/13/2015 to Dr. Zenaida Niece Trigt's office. Transportation was arranged and confirmed through My [employer]oint[employer]. Telephone outreach to patient to inform of transportation arrangements.   Laveyah Oriol L. Magnolia Mattila, AAS [facility] Care Management Assistant

## 2015-03-11 NOTE — Telephone Encounter (Signed)
See phone note 2/10 

## 2015-03-12 ENCOUNTER — Other Ambulatory Visit: Payer: Self-pay

## 2015-03-12 NOTE — Patient Outreach (Signed)
[facility]) Care Management  March 11, 2015   Bradley Benitez 09-16-1950 [id number]    Initial home visit with patient for assessment of need for community care coordination. Patient was recently discharged from a skilled nursing center after being admitted after an acute stay after having coronary artery bypass surgery.  Patient states he had not had any medical care for 40 years, does not have a primary care physician. [name], LCSW was available via telephone for collaborating in finding community resources.  This RNCM reviewed skilled nursing facility discharge paperwork. Patient is being followed by [facility] for PT/OT.  Patient lives alone in a single family home.   He states his income is less than $600, however, he may not qualify for medicaid as he indicates he gave his niece and nephew $84 for his burial expenses.  Patient has a diagnosis of CHF, states he knows nothing about the diagnosis, does not have a scale to weigh each day. Patient has most of his medication, those he has were indicated on his medication review.  Patient states he did not know he was a diagnosis of diabetes for which he knows nothing about.  Patient states his social support is limited to his niece and nephew.  This RNCM assisted patient in getting established with a primary care physician by calling several [facility] practices to see if they were accepting new patients including [facility], [facility] and Dr. Modesto Charon.   Patient and this RNCM agreed to meet again next week for HF and diabetes education, delivery of scales for daily weights.

## 2015-03-13 ENCOUNTER — Other Ambulatory Visit: Payer: Self-pay

## 2015-03-13 ENCOUNTER — Encounter: Payer: Self-pay | Admitting: Cardiothoracic Surgery

## 2015-03-13 ENCOUNTER — Ambulatory Visit (INDEPENDENT_AMBULATORY_CARE_PROVIDER_SITE_OTHER): Payer: Self-pay | Admitting: Cardiothoracic Surgery

## 2015-03-13 ENCOUNTER — Ambulatory Visit
Admission: RE | Admit: 2015-03-13 | Discharge: 2015-03-13 | Disposition: A | Discharge status: Home / Self Care | Payer: PPO | Source: Ambulatory Visit | Attending: Cardiothoracic Surgery | Admitting: Cardiothoracic Surgery

## 2015-03-13 VITALS — BP 116/68 | HR 74 | Resp 16 | Ht 73.0 in | Wt 216.0 lb

## 2015-03-13 DIAGNOSIS — I214 Non-ST elevation (NSTEMI) myocardial infarction: Secondary | ICD-10-CM

## 2015-03-13 DIAGNOSIS — Z951 Presence of aortocoronary bypass graft: Secondary | ICD-10-CM

## 2015-03-13 DIAGNOSIS — R0602 Shortness of breath: Secondary | ICD-10-CM | POA: Diagnosis not present

## 2015-03-13 DIAGNOSIS — I2510 Atherosclerotic heart disease of native coronary artery without angina pectoris: Secondary | ICD-10-CM

## 2015-03-13 MED FILL — CYCLOBENZAPRINE 10 MG TAB: 10 | 20 days supply | Qty: 20 | Fill #0

## 2015-03-13 MED FILL — metOLazone 5 MG TABS: 5 | 5 days supply | Qty: 5 | Fill #0

## 2015-03-13 MED FILL — FUROSEMIDE 40 MG TABLET: 40 | 30 days supply | Qty: 30 | Fill #0

## 2015-03-13 NOTE — Patient Outreach (Signed)
I called Bradley Benitez and discussed his need for assistance with medications.  He stated he had all his medications except for the three new medications Dr. Maren Beach just prescribed for him. I stated I could assist with purchasing those for him since he qualified for the Pharmacy Emergency Fund.  He stated he appreciated it.  I told him to go to the [facility] and they would be able to fill the medications for him.  I also asked if I could make a home visit on Friday to review his medications and enroll him in Extra Help.  He stated that would be fine.    Steve Rattler, PharmD, [employer] [facility] Environmental consultant [phone]

## 2015-03-13 NOTE — Progress Notes (Signed)
PCP is Lisabeth Pick, MD Referring Provider is Chrystie Nose, MD  Chief Complaint  Patient presents with  . Routine Post Op    s/p CABG X 4 .02/01/15. with a CXR    HPI:2 month followup after urgent CABG for ischemic cardiomyopathy and severe multivessel CAD.patient had postop ventricular arrhythmias which resolves with amiodarone and time. The patient is back living at home after transitioning from skilled nursing facility He denies angina. His chest x-rays clear however he has developed some pedal edema when his Lasix dose was dropped and he started eating lots of pimento cheese. 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 He has numbness in the right ulnar distribution of his hand.  He is being followed by the advanced heart failure clinic-Dr. Shirlee Latch   Past Medical History  Diagnosis Date  . DM2 (diabetes mellitus, type 2) (HCC)   . Gangrene (HCC)     10+ years ago    Past Surgical History  Procedure Laterality Date  . Knee surgery Left   . Cardiac catheterization N/A 01/25/2015    Procedure: Right/Left Heart Cath and Coronary Angiography;  Surgeon: Tonny Bollman, MD;  Location: [facility] INVASIVE CV LAB;  Service: Cardiovascular;  Laterality: N/A;  . Coronary artery bypass graft N/A 02/01/2015    Procedure: CORONARY ARTERY BYPASS GRAFTING (CABG)x 4 using left internal mammary artery and right greater saphenous leg vein using endoscope.;  Surgeon: [name], MD;  Location: [facility] OR;  Service: Open Heart Surgery;  Laterality: N/A;  . Tee without cardioversion N/A 02/01/2015    Procedure: TRANSESOPHAGEAL ECHOCARDIOGRAM (TEE);  Surgeon: [name], MD;  Location: [facility] OR;  Service: Open Heart Surgery;  Laterality: N/A;  . Placement of centrimag ventricular assist device N/A 02/01/2015    Procedure:  CENTRIMAG VENTRICULAR ASSIST DEVICE, back up;  Surgeon: [name], MD;  Location: [facility] OR;  Service: Open Heart  Surgery;  Laterality: N/A;    Family History  Problem Relation Age of Onset  . Diabetes Mother   . Diabetes Father   . Heart failure Brother     Social History Social History  Substance Use Topics  . Smoking status: Former Smoker -- 3.00 packs/day for 20 years    Types: Cigarettes    Quit date: 01/26/1994  . Smokeless tobacco: None  . Alcohol Use: No    Current Outpatient Prescriptions  Medication Sig Dispense Refill  . acetaminophen (TYLENOL) 500 MG tablet Take 1,000 mg by mouth every 8 (eight) hours as needed for mild pain. Reported on 03/11/2015    . amiodarone (PACERONE) 200 MG tablet Take 1 tablet (200 mg total) by mouth daily.    Marland Kitchen apixaban (ELIQUIS) 2.5 MG TABS tablet Take 1 tablet (2.5 mg total) by mouth 2 (two) times daily. 60 tablet 11  . aspirin 81 MG chewable tablet Chew 1 tablet (81 mg total) by mouth daily.    Marland Kitchen atorvastatin (LIPITOR) 80 MG tablet Take 1 tablet (80 mg total) by mouth daily at 6 PM.    . digoxin (LANOXIN) 0.125 MG tablet Take 1 tablet (0.125 mg total) by mouth daily.    . furosemide (LASIX) 40 MG tablet Take 1 tablet (40 mg total) by mouth every other day. 30 tablet   . gabapentin (NEURONTIN) 300 MG capsule Take 1 capsule (300 mg total) by mouth daily. 30 capsule 3  . insulin detemir (LEVEMIR) 100 UNIT/ML injection Inject 0.2 mLs (20 Units  total) into the skin daily. 10 mL 11  . insulin lispro (HUMALOG) 100 UNIT/ML injection Inject 0-10 Units into the skin 3 (three) times daily before meals. Per sliding scale    . lactulose (CHRONULAC) 10 GM/15ML solution Take 30 mLs (20 g total) by mouth daily as needed for mild constipation. 240 mL 0  . lidocaine (LIDODERM) 5 % Reported on 03/11/2015    . losartan (COZAAR) 25 MG tablet Take 0.5 tablets (12.5 mg total) by mouth 2 (two) times daily.    . pantoprazole (PROTONIX) 40 MG tablet Take 1 tablet (40 mg total) by mouth daily.    . potassium chloride SA (K-DUR,KLOR-CON) 20 MEQ tablet Take 1 tablet (20 mEq total)  by mouth every other day.    . spironolactone (ALDACTONE) 25 MG tablet Take 1 tablet (25 mg total) by mouth daily.     No current facility-administered medications for this visit.    Allergies  Allergen Reactions  . Codeine Other (See Comments)    intolerance    Review of Systems   Appetite improved, overall strength improve, sleeping habits improved No problems with the surgical incisions Has chronic diabetic ulcers in his feet  BP 116/68 mmHg  Pulse 74  Resp 16  Ht  (1.854 m)  Wt 216 lb (97.977 kg)  BMI 28.50 kg/m2  SpO2 98% Physical Exam Alert and comfortable Breath sounds clear and equal Range of motion of right arm limited because of shoulder pain. The patient had a previous traumatic  dislocation of the right shoulder. Heart rhythm regular without gallop or murmur 2+ pedal edema  Diagnostic Tests: Chest x-ray clear sternal wires intact  Impression: Doing well after CABG Probably has some recurrent CHF with ankle edema but is probably related to the increase salt intake and his diet  Plan: The patient may drive and lift up to 20 pounds maximum. No lifting more than 20 pounds until 3 months after surgery. We will increase his diuretic to 40  mg of Lasix daily and give an additional 3 doses of metolazone The patient be given Flexeril 10 mg every evening for his right shoulder pain help him sleep The patient is encouraged to use a heating pad on his right shoulder. 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  Mikey Bussing, MD [facility] [phone]

## 2015-03-14 ENCOUNTER — Telehealth: Payer: Self-pay | Admitting: Cardiology

## 2015-03-14 ENCOUNTER — Telehealth (HOSPITAL_COMMUNITY): Payer: Self-pay | Admitting: *Deleted

## 2015-03-14 ENCOUNTER — Other Ambulatory Visit: Payer: Self-pay | Admitting: Licensed Clinical Social Worker

## 2015-03-14 DIAGNOSIS — Z794 Long term (current) use of insulin: Secondary | ICD-10-CM | POA: Diagnosis not present

## 2015-03-14 DIAGNOSIS — Z7901 Long term (current) use of anticoagulants: Secondary | ICD-10-CM | POA: Diagnosis not present

## 2015-03-14 DIAGNOSIS — I110 Hypertensive heart disease with heart failure: Secondary | ICD-10-CM | POA: Diagnosis not present

## 2015-03-14 DIAGNOSIS — E1140 Type 2 diabetes mellitus with diabetic neuropathy, unspecified: Secondary | ICD-10-CM | POA: Diagnosis not present

## 2015-03-14 DIAGNOSIS — Z48812 Encounter for surgical aftercare following surgery on the circulatory system: Secondary | ICD-10-CM | POA: Diagnosis not present

## 2015-03-14 DIAGNOSIS — I5040 Unspecified combined systolic (congestive) and diastolic (congestive) heart failure: Secondary | ICD-10-CM | POA: Diagnosis not present

## 2015-03-14 DIAGNOSIS — Z951 Presence of aortocoronary bypass graft: Secondary | ICD-10-CM | POA: Diagnosis not present

## 2015-03-14 DIAGNOSIS — E785 Hyperlipidemia, unspecified: Secondary | ICD-10-CM | POA: Diagnosis not present

## 2015-03-14 DIAGNOSIS — Z87891 Personal history of nicotine dependence: Secondary | ICD-10-CM | POA: Diagnosis not present

## 2015-03-14 DIAGNOSIS — Z7982 Long term (current) use of aspirin: Secondary | ICD-10-CM | POA: Diagnosis not present

## 2015-03-14 DIAGNOSIS — I2510 Atherosclerotic heart disease of native coronary artery without angina pectoris: Secondary | ICD-10-CM | POA: Diagnosis not present

## 2015-03-14 MED ORDER — LOSARTAN POTASSIUM 25 MG PO TABS: 12.5000 mg | ORAL_TABLET | Freq: Two times a day (BID) | ORAL | Status: DC

## 2015-03-14 MED ORDER — DIGOXIN 125 MCG PO TABS: 0.1250 mg | ORAL_TABLET | Freq: Every day | ORAL | Status: DC

## 2015-03-14 MED ORDER — AMIODARONE HCL 200 MG PO TABS: 200.0000 mg | ORAL_TABLET | Freq: Every day | ORAL | Status: DC

## 2015-03-14 MED ORDER — ATORVASTATIN CALCIUM 80 MG PO TABS: 80.0000 mg | ORAL_TABLET | Freq: Every day | ORAL | Status: DC

## 2015-03-14 MED ORDER — FUROSEMIDE 40 MG PO TABS: 40.0000 mg | ORAL_TABLET | Freq: Every day | ORAL | Status: DC

## 2015-03-14 MED ORDER — APIXABAN 2.5 MG PO TABS: 2.5000 mg | ORAL_TABLET | Freq: Two times a day (BID) | ORAL | Status: DC

## 2015-03-14 MED ORDER — POTASSIUM CHLORIDE CRYS ER 20 MEQ PO TBCR: 20.0000 meq | EXTENDED_RELEASE_TABLET | ORAL | Status: DC

## 2015-03-14 MED ORDER — SPIRONOLACTONE 25 MG PO TABS: 25.0000 mg | ORAL_TABLET | Freq: Every day | ORAL | Status: DC

## 2015-03-14 NOTE — Telephone Encounter (Signed)
Error

## 2015-03-14 NOTE — Patient Outreach (Signed)
[facility]) Care Management  03/14/2015  Bradley Benitez 10-21-1950 [id number]   Addendum-CSW received call back from patient. Patient reports frustration as his assessment appointment with [facility] will not be until 03/27/15 at 9:30 am. Patient has several medical appointments within the next few weeks. Patient has an appointment with [name], MD his cardiologist on 03/19/15 at 12:00 pm. Patient is in need of stable transportation to this appointment. Patient's nephew is unable to transport him as he has already asked. Patient's sister in law states that she has something planned that day as well. CSW will make a request for [facility] to provide stable transportation to this appointment.   Plan-CSW will send request for transportation to cardiologist appointment. CSW will continue to provide social work assistance and support to patient.  [name], BSW, MSW, LCSW [facility] Hydrograph[employer] [email or web address] Phone: [phone] Fax: [phone]

## 2015-03-14 NOTE — Telephone Encounter (Signed)
HHRN w/[facility], she wants to make sure Dr Shirlee Latch will sign home health orders and pt needs refills of heart medications.  Refills sent in and ok for Dr Shirlee Latch to sign orders.  She also states pt saw Dr Zenaida Niece Tright yesterday and was told to increase lasix to 40 mg daily, verified this by his note will adjust med list

## 2015-03-14 NOTE — Patient Outreach (Signed)
[facility]) Care Management  03/14/2015  Bradley Benitez 04/26/1950 [id number]   Assessment-CSW received two voice messages from patient. Patient asking for [facility] Pharmacist contact information. CSW completed outreach to patient and was able to successfully reach him. CSW provided him with [facility] Pharmacist number but informed him that she would be completing home visit tomorrow at 1 pm. Patient expressed understanding. Patient has not heard back from [facility] at this point. CSW provided patient with [facility] number for him to cal and set up assessment appointment as he is in need of stable transportation. Patient continues to receive Mobile Meals and is aware that this is a 30 day period paid by [facility].   Plan-CSW will email [facility] admission Wandra Feinstein and notify her that patient will be calling her today. CSW will continue to assist patient in gaining community resources.  [name], BSW, MSW, LCSW [facility] Hydro[facility] [email or web address] Phone: [phone] Fax: [phone]

## 2015-03-15 ENCOUNTER — Other Ambulatory Visit: Payer: Self-pay

## 2015-03-15 ENCOUNTER — Other Ambulatory Visit: Payer: Self-pay | Admitting: Licensed Clinical Social Worker

## 2015-03-15 NOTE — Patient Outreach (Signed)
[facility]) Care Management  03/15/2015  Bradley Benitez [birth year] [id number]   Telephone outreach to patient to inform him of [facility] arrangements [facility] on [facility]19/2015. Transportation was arranged and confirmed using My [employer].  Adylene Dlugosz L. Lloyd Ayo, AAS [facility] Care Management Assistant

## 2015-03-15 NOTE — Patient Outreach (Signed)
[facility]) Care Management  03/15/2015  Bradley Benitez Jun 15, 1950 [id number]   Assessment-Confirmed with [facility] Care Management Assistant Damita Rhodie that patient will have stable transportation with [employer] (paid by [facility]) to and from upcoming cardiologist appointment.  Plan-CSW will continue to provide social work assistance to patient as needed.  [name], BSW, MSW, LCSW [facility] Hydro[facility] [email or web address] Phone: [phone] Fax: [phone]

## 2015-03-18 ENCOUNTER — Other Ambulatory Visit: Payer: Self-pay

## 2015-03-18 ENCOUNTER — Other Ambulatory Visit: Payer: Self-pay | Admitting: Licensed Clinical Social Worker

## 2015-03-18 DIAGNOSIS — E1159 Type 2 diabetes mellitus with other circulatory complications: Secondary | ICD-10-CM

## 2015-03-18 MED FILL — ELIQUIS 2.5 MG TABLET: 2.5 | 30 days supply | Qty: 60 | Fill #0

## 2015-03-18 NOTE — Patient Outreach (Signed)
[facility]) Care Management  03/18/2015  Bradley Benitez [birth year] [id number]   Assessment-CSW received call from patient. Patient states he has a physician appointment with Dr. Pearson Benitez on 03/21/15 at 11:00 am. Patient needs to see physician as he has ran out of medications and need for physician to write him new medications in order to maintain health compliance. Patient is extremely anxious about running out of medications. Patient states that family cannot provide transportation. [facility] application pending (assessment appointment on 03/27/15.)  Plan-CSW has sent request to [facility] Care Management Assistant for approval. Transportation arranged.   Bradley Benitez, BSW, MSW, LCSW [facility] Hydro[employer] [email or web address] Phone: [phone] Fax: [phone]
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
## 2015-03-19 ENCOUNTER — Encounter: Payer: Self-pay | Admitting: Licensed Clinical Social Worker

## 2015-03-19 ENCOUNTER — Ambulatory Visit: Payer: Self-pay

## 2015-03-19 ENCOUNTER — Ambulatory Visit (HOSPITAL_COMMUNITY)
Admission: RE | Admit: 2015-03-19 | Discharge: 2015-03-19 | Disposition: A | Discharge status: Home / Self Care | Payer: PPO | Source: Ambulatory Visit | Attending: Cardiology | Admitting: Cardiology

## 2015-03-19 VITALS — BP 98/58 | HR 84 | Wt 209.2 lb

## 2015-03-19 DIAGNOSIS — Z7901 Long term (current) use of anticoagulants: Secondary | ICD-10-CM | POA: Diagnosis not present

## 2015-03-19 DIAGNOSIS — E785 Hyperlipidemia, unspecified: Secondary | ICD-10-CM | POA: Diagnosis not present

## 2015-03-19 DIAGNOSIS — Z833 Family history of diabetes mellitus: Secondary | ICD-10-CM | POA: Insufficient documentation

## 2015-03-19 DIAGNOSIS — Z7902 Long term (current) use of antithrombotics/antiplatelets: Secondary | ICD-10-CM | POA: Diagnosis not present

## 2015-03-19 DIAGNOSIS — Z794 Long term (current) use of insulin: Secondary | ICD-10-CM | POA: Diagnosis not present

## 2015-03-19 DIAGNOSIS — I5021 Acute systolic (congestive) heart failure: Secondary | ICD-10-CM | POA: Diagnosis not present

## 2015-03-19 DIAGNOSIS — I5022 Chronic systolic (congestive) heart failure: Secondary | ICD-10-CM | POA: Diagnosis not present

## 2015-03-19 DIAGNOSIS — Z8249 Family history of ischemic heart disease and other diseases of the circulatory system: Secondary | ICD-10-CM | POA: Insufficient documentation

## 2015-03-19 DIAGNOSIS — Z951 Presence of aortocoronary bypass graft: Secondary | ICD-10-CM | POA: Diagnosis not present

## 2015-03-19 DIAGNOSIS — Z86718 Personal history of other venous thrombosis and embolism: Secondary | ICD-10-CM | POA: Insufficient documentation

## 2015-03-19 DIAGNOSIS — Z79899 Other long term (current) drug therapy: Secondary | ICD-10-CM | POA: Diagnosis not present

## 2015-03-19 DIAGNOSIS — Z87891 Personal history of nicotine dependence: Secondary | ICD-10-CM | POA: Insufficient documentation

## 2015-03-19 DIAGNOSIS — Z8674 Personal history of sudden cardiac arrest: Secondary | ICD-10-CM | POA: Diagnosis not present

## 2015-03-19 DIAGNOSIS — I255 Ischemic cardiomyopathy: Secondary | ICD-10-CM | POA: Insufficient documentation

## 2015-03-19 DIAGNOSIS — I2510 Atherosclerotic heart disease of native coronary artery without angina pectoris: Secondary | ICD-10-CM | POA: Diagnosis not present

## 2015-03-19 DIAGNOSIS — Z48812 Encounter for surgical aftercare following surgery on the circulatory system: Secondary | ICD-10-CM | POA: Diagnosis not present

## 2015-03-19 DIAGNOSIS — E1140 Type 2 diabetes mellitus with diabetic neuropathy, unspecified: Secondary | ICD-10-CM | POA: Diagnosis not present

## 2015-03-19 DIAGNOSIS — I213 ST elevation (STEMI) myocardial infarction of unspecified site: Secondary | ICD-10-CM

## 2015-03-19 DIAGNOSIS — I110 Hypertensive heart disease with heart failure: Secondary | ICD-10-CM | POA: Diagnosis not present

## 2015-03-19 DIAGNOSIS — Z7982 Long term (current) use of aspirin: Secondary | ICD-10-CM | POA: Diagnosis not present

## 2015-03-19 DIAGNOSIS — E119 Type 2 diabetes mellitus without complications: Secondary | ICD-10-CM | POA: Insufficient documentation

## 2015-03-19 DIAGNOSIS — I5040 Unspecified combined systolic (congestive) and diastolic (congestive) heart failure: Secondary | ICD-10-CM | POA: Diagnosis not present

## 2015-03-19 DIAGNOSIS — I513 Intracardiac thrombosis, not elsewhere classified: Secondary | ICD-10-CM

## 2015-03-19 LAB — COMPREHENSIVE METABOLIC PANEL
ALK PHOS: 75 U/L (ref 38–126)
ALT: 23 U/L (ref 17–63)
AST: 17 U/L (ref 15–41)
Albumin: 4 g/dL (ref 3.5–5.0)
Anion gap: 13 (ref 5–15)
BUN: 17 mg/dL (ref 6–20)
CALCIUM: 9.8 mg/dL (ref 8.9–10.3)
CO2: 25 mmol/L (ref 22–32)
CREATININE: 1.53 mg/dL — AB (ref 0.61–1.24)
Chloride: 100 mmol/L — ABNORMAL LOW (ref 101–111)
GFR, EST AFRICAN AMERICAN: 53 mL/min — AB (ref >60)
GFR, EST NON AFRICAN AMERICAN: 46 mL/min — AB (ref >60)
Glucose, Bld: 225 mg/dL — ABNORMAL HIGH (ref 65–99)
Potassium: 5 mmol/L (ref 3.5–5.1)
SODIUM: 138 mmol/L (ref 135–145)
Total Bilirubin: 0.5 mg/dL (ref 0.3–1.2)
Total Protein: 7.1 g/dL (ref 6.5–8.1)

## 2015-03-19 LAB — CBC
HCT: 40.1 % (ref 39.0–52.0)
Hemoglobin: 13.8 g/dL (ref 13.0–17.0)
MCH: 27.3 pg (ref 26.0–34.0)
MCHC: 34.4 g/dL (ref 30.0–36.0)
MCV: 79.4 fL (ref 78.0–100.0)
PLATELETS: 232 10*3/uL (ref 150–400)
RBC: 5.05 MIL/uL (ref 4.22–5.81)
RDW: 13.3 % (ref 11.5–15.5)
WBC: 7.2 10*3/uL (ref 4.0–10.5)

## 2015-03-19 LAB — TSH: TSH: 3.673 u[IU]/mL (ref 0.350–4.500)

## 2015-03-19 LAB — DIGOXIN LEVEL: Digoxin Level: 1.4 ng/mL (ref 0.8–2.0)

## 2015-03-19 MED ORDER — APIXABAN 5 MG PO TABS: 5.0000 mg | ORAL_TABLET | Freq: Two times a day (BID) | ORAL | Status: DC

## 2015-03-19 MED ORDER — LOSARTAN POTASSIUM 25 MG PO TABS: 25.0000 mg | ORAL_TABLET | Freq: Every day | ORAL | Status: DC

## 2015-03-19 MED ORDER — POTASSIUM CHLORIDE CRYS ER 20 MEQ PO TBCR: 20.0000 meq | EXTENDED_RELEASE_TABLET | Freq: Every day | ORAL | Status: DC

## 2015-03-19 NOTE — Progress Notes (Signed)
Medication Samples have been provided to the patient.  Drug name: Eliquis   Qty: 3 boxes  LOT: [id number]  Exp.Date: 02/2017  The patient has been instructed regarding the correct time, dose, and frequency of taking this medication, including desired effects and most common side effects.   Bedelia Pong 12:48 PM 03/19/2015

## 2015-03-19 NOTE — Patient Outreach (Signed)
[facility]) Care Management  03/19/2015  Bradley Benitez 25-Jul-1950 [id number]   Transportation request received from [name], [place] to arrange transportation for patient's appointment to Dr. Selena Batten on 03/21/2015. Transportation was arranged and confirmed using My [employer]oint[employer]. Patient is aware of arrangements and knows what time to be ready for pickup.  [name] L. [name], AAS [facility] Care Management Assistant

## 2015-03-19 NOTE — Patient Instructions (Signed)
Increase Eliquis to 5 mg Twice daily   Increase Potassium to 20 meq daily  Change Losartan to 25 mg (1 tab) at bedtime  Labs today  Your physician recommends that you schedule a follow-up appointment in: 3 weeks

## 2015-03-19 NOTE — Progress Notes (Signed)
CSW referred to assist patient medication assistance. Patient reports that he receives SSA of $575 monthly and has Medicare. Patient reports he does not have Medicare D and no other prescription assistance. Patient reports he has no other assets and rents his apartment. CSW discussed possibly eligibile for SSI benefits due to his low SSA income. CSW referred patient to Social Security for further evaluation and also discuss Medicare D enrollment. Patient verbalizes understanding and will follow up with Social Security. CSW encouraged patient to return call to CSW with outcome and further assistance pending SSA visit. Lasandra Beech, LCSW [phone]

## 2015-03-20 ENCOUNTER — Ambulatory Visit: Payer: Self-pay | Admitting: Neurology

## 2015-03-20 ENCOUNTER — Other Ambulatory Visit: Payer: Self-pay | Admitting: Licensed Clinical Social Worker

## 2015-03-20 DIAGNOSIS — I5022 Chronic systolic (congestive) heart failure: Secondary | ICD-10-CM | POA: Insufficient documentation

## 2015-03-20 NOTE — Patient Outreach (Signed)
[facility]) Care Management  03/20/2015  ELZIE KNISLEY 1950-07-28 [id number]   Assessment-CSW received call from patient. Patient received a medical bill from when he was in the hospital for almost $500 and he states that he does not know what to do. CSW questioned if he had contacted his insurance provider and he declined. CSW advised him to contact his insurance.  Plan-CSW will continue to provide social work assistance to patient. [facility] assessment appointment scheduled for 03/27/15.  [name], BSW, MSW, LCSW [facility] Hydrograph[employer] [email or web address] Phone: [phone] Fax: [phone]

## 2015-03-20 NOTE — Progress Notes (Signed)
Patient ID: Bradley Benitez, male   DOB: 01-Mar-1950, 65 y.o.   MRN: [id number]   PCP: Dr Selena Batten Primary HF Cardiologist: Dr Shirlee Latch  Cardiac Surgeon: Dr Donata Clay   HPI: Mr Steiner is a 65 year old with a history of DM, smoker, ICM, chronic sysotolic HF, V fib arrest 01/2015 and S/P CABG x4 01/2015.   Admitted to [facility] the end of December with chest pain. LHC showed 3VD and he underwent CABG. Hospital course was complicated by V fib on 02/05/15 required 9 defibrillations. Loaded on amio. He was not placed on bb due to low output. Placed on eliquis 2.5 mg twice a day per cardiac surgery. Discharge weight 208 pounds.   He returns follow up. Still reports right forearm pain extending to to shoulder. Thought to be neuropathic.  He is now at home.  He has not been particularly active but denies dyspnea walking short distances.  No chest pain.  No tachypalpitations.  When he saw Dr Donata Clay recently, he was thought to be volume overloaded.  Lasix was increased to 40 mg daily and he was given metolazone to take for about 4 days.  He has finished the metolazone and weight is down.    Labs (1/17): K 4.1, creatinine 1.14  LHC/RHC (12/16): RA mean 11 PA 35/11 PCWP mean 23 CI 2.59   Ost RPDA lesion, 70% stenosed.  1st RPLB lesion, 80% stenosed.  Mid Cx lesion, 80% stenosed.  Prox LAD lesion, 60% stenosed.  Mid LAD lesion, 95% stenosed.  Ost 2nd Diag lesion, 70% stenosed.  Ost Ramus to Ramus lesion, 90% stenosed  Cardiac MRI (1/17): 1. Severe LV systolic dysfunction, EF 26%. Wall motion abnormalities as above. 2. Normal RV size and systolic function . 3. LGE pattern suggestive of infarction in LAD territory. The mid anterior/anteroseptal and apical anterior/septal/inferior wall segments and the true apex do not appear viable (probably not likely to improve function with revascularization). The remainder of the left ventricle appears viable. 4. There is a small LV apical thrombus.  ECHO  (1/17): EF 20-25% with Apical thombus.   ROS: All systems negative except as listed in HPI, PMH and Problem List.  SH:  Social History   Social History  . Marital Status: Single    Spouse Name: N/A  . Number of Children: N/A  . Years of Education: N/A   Occupational History  . Not on file.   Social History Main Topics  . Smoking status: Former Smoker -- 3.00 packs/day for 20 years    Types: Cigarettes    Quit date: 01/26/1994  . Smokeless tobacco: Not on file  . Alcohol Use: No  . Drug Use: Yes     Comment: Previously smoked marijuana, none in 7 years  . Sexual Activity: Not on file   Other Topics Concern  . Not on file   Social History Narrative    FH:  Family History  Problem Relation Age of Onset  . Diabetes Mother   . Diabetes Father   . Heart failure Brother     Past Medical History  Diagnosis Date  . DM2 (diabetes mellitus, type 2) (HCC)   . Gangrene (HCC)     10+ years ago    Current Outpatient Prescriptions  Medication Sig Dispense Refill  . acetaminophen (TYLENOL) 500 MG tablet Take 1,000 mg by mouth every 8 (eight) hours as needed for mild pain. Reported on 03/11/2015    . amiodarone (PACERONE) 200 MG tablet Take 1 tablet (  200 mg total) by mouth daily. 30 tablet 3  . apixaban (ELIQUIS) 5 MG TABS tablet Take 1 tablet (5 mg total) by mouth 2 (two) times daily. 60 tablet 6  . atorvastatin (LIPITOR) 80 MG tablet Take 1 tablet (80 mg total) by mouth daily at 6 PM. 30 tablet 3  . digoxin (LANOXIN) 0.125 MG tablet Take 1 tablet (0.125 mg total) by mouth daily. 30 tablet 3  . furosemide (LASIX) 40 MG tablet Take 1 tablet (40 mg total) by mouth daily. 30 tablet 3  . gabapentin (NEURONTIN) 300 MG capsule Take 1 capsule (300 mg total) by mouth daily. 30 capsule 3  . insulin detemir (LEVEMIR) 100 UNIT/ML injection Inject 0.2 mLs (20 Units total) into the skin daily. 10 mL 11  . insulin lispro (HUMALOG) 100 UNIT/ML injection Inject 0-10 Units into the skin 3  (three) times daily before meals. Per sliding scale    . lidocaine (LIDODERM) 5 % Reported on 03/11/2015    . losartan (COZAAR) 25 MG tablet Take 1 tablet (25 mg total) by mouth at bedtime. 30 tablet 3  . potassium chloride SA (K-DUR,KLOR-CON) 20 MEQ tablet Take 1 tablet (20 mEq total) by mouth daily. 30 tablet 3  . spironolactone (ALDACTONE) 25 MG tablet Take 1 tablet (25 mg total) by mouth daily. 30 tablet 3   No current facility-administered medications for this encounter.    Filed Vitals:   03/19/15 1214  BP: 98/58  Pulse: 84  Weight: 209 lb 4 oz (94.915 kg)  SpO2: 100%   PHYSICAL EXAM:  General:  Well appearing. No resp difficulty. Arrived in a wheelchair.  HEENT: normal Neck: supple. JVP flat. Carotids 2+ bilaterally; no bruits. No lymphadenopathy or thryomegaly appreciated. Cor: PMI normal. Regular rate & rhythm. No rubs, gallops or murmurs. Sternal scar Lungs: clear Abdomen: soft, nontender, nondistended. No hepatosplenomegaly. No bruits or masses. Good bowel sounds. Extremities: no cyanosis, clubbing, rash.  No edema. Neuro: alert & orientedx3, cranial nerves grossly intact. Moves all 4 extremities w/o difficulty. Affect pleasant.  ASSESSMENT & PLAN: 1. 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  - Continue amio 200 mg daily. Check LFTs and TSH today.  He will need regular eye exams.  2. Chronic systolic CHF: EF 09% with regional WMAs by cardiac MRI, ischemic cardiomyopathy. S/P CABG x 4. NYHA II but not very active. He is not volume overloaded on exam.  - Continue lasix 40 mg daily, increase KCl to 20 daily. BMET today.  - No BP room yet for beta blocker.  - Continue digoxin, check level today.   - Change losartan from 12.5 mg bid to 25 mg qhs, hopefully this will have less effect on him. - Continue spironolactone 25 mg daily.  3. CAD: Diffuse 3 vessel disease, see cath report above. MRI reviewed: LAD territory did not appear to have  significant viability. Remainder of LV myocardium did appear viable. He had severe disease in LCx and RCA territory that may benefit from revascularization but suspect LAD territory will not recover. s/p CABG x 4 in 1/17. Suspect ventricular fibrillation arrest 1/10 was scar-mediated as opposed to occlusion of graft, etc.  - At this point, I think he can stop ASA as he will be increasing apixaban to 5 mg bid. - PT to start this week, after home PT he will need cardiac rehab.  4. LV mural thrombus: Seen on 02/05/2015. Apixaban started 2.5 mg twice a day per cardiac surgery.  Not thought to be a good  candidate for coumadin (concern about compliance with followup).  - Increase apixaban to 5 mg bid.     Followup in 3 wks.  [name] NP  12:05 AM

## 2015-03-21 ENCOUNTER — Telehealth (HOSPITAL_COMMUNITY): Payer: Self-pay | Admitting: Vascular Surgery

## 2015-03-21 ENCOUNTER — Ambulatory Visit: Payer: Self-pay | Admitting: Neurology

## 2015-03-21 DIAGNOSIS — I10 Essential (primary) hypertension: Secondary | ICD-10-CM | POA: Diagnosis not present

## 2015-03-21 DIAGNOSIS — I5030 Unspecified diastolic (congestive) heart failure: Secondary | ICD-10-CM | POA: Diagnosis not present

## 2015-03-21 DIAGNOSIS — E119 Type 2 diabetes mellitus without complications: Secondary | ICD-10-CM | POA: Diagnosis not present

## 2015-03-21 DIAGNOSIS — I25810 Atherosclerosis of coronary artery bypass graft(s) without angina pectoris: Secondary | ICD-10-CM | POA: Diagnosis not present

## 2015-03-21 MED ORDER — FUROSEMIDE 40 MG PO TABS: 20.0000 mg | ORAL_TABLET | Freq: Every day | ORAL | Status: DC

## 2015-03-21 MED FILL — FREESTYLE LANCETS: 33 days supply | Qty: 100 | Fill #0

## 2015-03-21 MED FILL — FREESTYLE LITE TEST STRIP: 33 days supply | Qty: 100 | Fill #0

## 2015-03-21 NOTE — Telephone Encounter (Signed)
Notes Recorded by Noralee Space, RN on 03/21/2015 at 4:33 PM Pt aware, agreeable and verbalizes understanding, repeat labs 3/3

## 2015-03-21 NOTE — Telephone Encounter (Signed)
Pt called he needs someone to call his increased dosage of potassium sent to [facility].. Please advise pt states this [place] [facility]

## 2015-03-21 NOTE — Telephone Encounter (Signed)
-----   Message from Laurey Morale, MD sent at 03/21/2015 12:30 AM EST ----- Stop KCl. Decrease Lasix to 20 mg daily.  Repeat digoxin level as trough (in morning before taking digoxin). Call today.

## 2015-03-21 NOTE — Patient Outreach (Signed)
[facility]) Care Management  [facility] CM Pharmacy  03/21/2015  This is a late entry for 03/15/15  Bradley Benitez 05/20/1950 [id number]  Subjective: Mr. Salais is a 65 year old male who was referred to pharmacy for medication management and medication assistance.  He is having difficulty paying for his Eliquis and test strips.  He does not have a primary care provider currently and does not have a prescription for the test strips to be billed under his Part B.  He is trying to set up an appointment with Dr. Selena Batten at [facility].  He has a 30 day free supply coupon for Eliquis which he just needs to present to the pharmacy with a 30 day prescription and he will get it free.  [facility] has completed the paperwork for him to get Eliquis from the company.  He just needs to spend about $90 more out of pocket on prescription and he will qualify.  Today, I am going to complete the Extra Help application.  He has all his medications today.  He is about to run out of his Humalog.  He does not have a prescription to refill it though.    Objective:   Current Medications: Current Outpatient Prescriptions  Medication Sig Dispense Refill  . amiodarone (PACERONE) 200 MG tablet Take 1 tablet (200 mg total) by mouth daily. 30 tablet 3  . atorvastatin (LIPITOR) 80 MG tablet Take 1 tablet (80 mg total) by mouth daily at 6 PM. 30 tablet 3  . digoxin (LANOXIN) 0.125 MG tablet Take 1 tablet (0.125 mg total) by mouth daily. 30 tablet 3  . furosemide (LASIX) 40 MG tablet Take 1 tablet (40 mg total) by mouth daily. 30 tablet 3  . gabapentin (NEURONTIN) 300 MG capsule Take 1 capsule (300 mg total) by mouth daily. 30 capsule 3  . insulin detemir (LEVEMIR) 100 UNIT/ML injection Inject 0.2 mLs (20 Units total) into the skin daily. 10 mL 11  . insulin lispro (HUMALOG) 100 UNIT/ML injection Inject 0-10 Units into the skin 3 (three) times daily before meals. Per sliding scale     . lidocaine (LIDODERM) 5 % Reported on 03/11/2015    . spironolactone (ALDACTONE) 25 MG tablet Take 1 tablet (25 mg total) by mouth daily. 30 tablet 3  . acetaminophen (TYLENOL) 500 MG tablet Take 1,000 mg by mouth every 8 (eight) hours as needed for mild pain. Reported on 03/11/2015    . apixaban (ELIQUIS) 5 MG TABS tablet Take 1 tablet (5 mg total) by mouth 2 (two) times daily. 60 tablet 6  . losartan (COZAAR) 25 MG tablet Take 1 tablet (25 mg total) by mouth at bedtime. 30 tablet 3  . potassium chloride SA (K-DUR,KLOR-CON) 20 MEQ tablet Take 1 tablet (20 mEq total) by mouth daily. 30 tablet 3   No current facility-administered medications for this visit.    Functional Status: In your present state of health, do you have any difficulty performing the following activities: 03/11/2015 01/23/2015  Hearing? N N  Vision? Y N  Difficulty concentrating or making decisions? Y N  Walking or climbing stairs? Y N  Dressing or bathing? Y N  Doing errands, shopping? Malvin Johns  Preparing Food and eating ? N -  Using the Toilet? N -  In the past six months, have you accidently leaked urine? N -  Do you have problems with loss of bowel control? N -  Managing your Medications? Y -  Managing your Finances? Y -  Housekeeping or managing your Housekeeping? Y -    Fall/Depression Screening: PHQ 2/9 Scores 03/11/2015 02/22/2015  PHQ - 2 Score 0 0    Assessment: 1.  Medication management:  Mr. Sinning is able to manage his medication once he has them in the home.  He has difficulty understanding when he is able to refill them and who is supposed to prescribe which medication.  If his medications as not filled at the same time it adds to his confusion.   2. Medication affordability:  Mr. Oak will benefit from completely the Extra Help application.  Based on his income, he should qualify for some assistance.   3.  Humalog:  Humalog is nonformulary.  He will need to be switched to Novolog which is on  [employer]'s formulary.   Plan: 1.  I completed the Extra Help application for Mr. Troy.  He will find out in 2 to 4 weeks if he qualifies for assistance.  I helped him with purchasing his furosemide, cyclobenzaprine, and metolazone.  2.  I recommended he talked to Dr. Selena Batten about getting a prescription for Novolog, test strips, and needles or pen needs.  Prescriptions for all of these will be covered under his insurance.    3.  I will continue to work with Mr. Roam to help him understand when he needs to get his medications refilled.  He may need to work with a pharmacy that specializes in synchronization of medications.  I did not see any major drug interactions or problems when reviewing his medication list.    4.  I will follow up with Ms. Perales in  3 weeks to see if he qualified for Extra Help.    [facility] CM Care Plan Problem One        Most Recent Value   Care Plan Problem One  Medication Management   Role Documenting the Problem One  Clinical Pharmacist   Care Plan for Problem One  Active   [facility] Long Term Goal (31-90 days)  Mr. Andel will be able to manage his medications by himself (refill them) over the 90 days evident by patient report and by the patient not calling the pharmacist daily   [facility] Long Term Goal Start Date  03/21/15   Interventions for Problem One Long Term Goal  I educated Mr Winterbottom on how to obtain his medications.  I instructed him on how to get prescriptions from his providers   I educated him on how he cannot get his prescriptions filled but once a month.    [facility] CM Short Term Goal #1 (0-30 days)  Mr. Spitler will fill his prescriptions on his own over the next 30 days evident by him having all his medications at his next pharmacy visit    [facility] CM Short Term Goal #1 Start Date  03/21/15   Interventions for Short Term Goal #1  I gave Mr. Atchley resources on how to fill his prescriptions.  I educated him on how to fill his prescriptions once a month.    [facility] CM  Care Plan Problem Two        Most Recent Value   Care Plan Problem Two  Medication affordability   Role Documenting the Problem Two  Clinical Pharmacist   Care Plan for Problem Two  Active   [facility] CM Short Term Goal #1 (0-30 days)  Mr. Stooksbury will apply for Extra Help and let the pharmacist team know if he  is approved for Extra Help in the next 30 days   [facility] CM Short Term Goal #1 Start Date  03/21/15   Interventions for Short Term Goal #2   I helped Mr Zuckerman apply for Extra Help.  He will hear in the next 3 to 4 weeks if he gets approved.         Steve Rattler, PharmD, [employer] [facility] [phone]

## 2015-03-22 MED FILL — ULTICARE SYR 0.5 ML 30GX5/1: 30G X 5/16" | 30 days supply | Qty: 100 | Fill #0

## 2015-03-25 ENCOUNTER — Other Ambulatory Visit: Payer: Self-pay

## 2015-03-25 DIAGNOSIS — E119 Type 2 diabetes mellitus without complications: Secondary | ICD-10-CM | POA: Diagnosis not present

## 2015-03-25 DIAGNOSIS — I10 Essential (primary) hypertension: Secondary | ICD-10-CM | POA: Diagnosis not present

## 2015-03-25 NOTE — Patient Outreach (Signed)
I called Bradley Benitez back in regards to his question about his syringes.  He was questioning why he had to pay $22.99 for his syringes and he did not have a co-pay his strips or lancets.  I explained to him that his strips and lancets were covered under his Part B for zero dollars but needles, syringes, and alcohol swabs are all covered under Part D at a Tier 3 co-pay ($90 for a 90 day supply).  I explained that if he receives Extra Help the cost of his medications and the syringes will go down as well.  He is very concerned about how he will pay for all of his medications.  He may benefit from financial management or education.  I will follow up in 2 to 4 weeks in regards to the Extra Help.   Steve Rattler, PharmD, [employer] [facility] [phone]

## 2015-03-26 ENCOUNTER — Other Ambulatory Visit: Payer: Self-pay

## 2015-03-26 DIAGNOSIS — E1140 Type 2 diabetes mellitus with diabetic neuropathy, unspecified: Secondary | ICD-10-CM | POA: Diagnosis not present

## 2015-03-26 DIAGNOSIS — I110 Hypertensive heart disease with heart failure: Secondary | ICD-10-CM | POA: Diagnosis not present

## 2015-03-26 DIAGNOSIS — I2510 Atherosclerotic heart disease of native coronary artery without angina pectoris: Secondary | ICD-10-CM | POA: Diagnosis not present

## 2015-03-26 DIAGNOSIS — Z7982 Long term (current) use of aspirin: Secondary | ICD-10-CM | POA: Diagnosis not present

## 2015-03-26 DIAGNOSIS — I5040 Unspecified combined systolic (congestive) and diastolic (congestive) heart failure: Secondary | ICD-10-CM | POA: Diagnosis not present

## 2015-03-26 DIAGNOSIS — Z48812 Encounter for surgical aftercare following surgery on the circulatory system: Secondary | ICD-10-CM | POA: Diagnosis not present

## 2015-03-26 DIAGNOSIS — Z951 Presence of aortocoronary bypass graft: Secondary | ICD-10-CM | POA: Diagnosis not present

## 2015-03-26 DIAGNOSIS — Z87891 Personal history of nicotine dependence: Secondary | ICD-10-CM | POA: Diagnosis not present

## 2015-03-26 DIAGNOSIS — Z794 Long term (current) use of insulin: Secondary | ICD-10-CM | POA: Diagnosis not present

## 2015-03-26 DIAGNOSIS — E785 Hyperlipidemia, unspecified: Secondary | ICD-10-CM | POA: Diagnosis not present

## 2015-03-26 DIAGNOSIS — Z7901 Long term (current) use of anticoagulants: Secondary | ICD-10-CM | POA: Diagnosis not present

## 2015-03-26 NOTE — Patient Outreach (Signed)
[facility]) Care Management  03/26/2015  Bradley Benitez 1950-05-18 [id number]      [facility]  Spoke with Efraim Kaufmann who stated they had attempted to   Appointment made at [facility] for March 28 at 2pm

## 2015-03-27 ENCOUNTER — Telehealth: Payer: Self-pay | Admitting: *Deleted

## 2015-03-27 MED FILL — CYCLOBENZAPRINE 10 MG TAB: 10 | 20 days supply | Qty: 20 | Fill #1

## 2015-03-27 NOTE — Patient Outreach (Signed)
This RNCM met with patient in his home to assess his need for community care coordination. Patient stats he is doing good, still weak.  Patient presented the weights he had recorded for the past week. Patient states he understands the importance of weighing daily.  Patient and RNCM reviewed the Heart Failure Action Plan. Patient and RNCM reviewed EMMI videos on Advanced Planning and Fall Prevention. Patient states he has paid for his funeral already and his nice and nephew will make any decisions regarding his care. This RNCM encouraged patient to have his wishes written down taking the burden off his family.  Patient and RNCM reviewed is care plan and updated. Verbal praise was given to patient for all his effort in making lifestyle changes.    Plan: Telephone call week of March 10 for discharge.

## 2015-03-27 NOTE — Telephone Encounter (Signed)
Patient left a voicemail requesting a call back to discuss his refills. He can be reached at [phone]. Thanks, MI

## 2015-03-29 ENCOUNTER — Other Ambulatory Visit (HOSPITAL_COMMUNITY): Payer: Self-pay

## 2015-03-30 DIAGNOSIS — Z794 Long term (current) use of insulin: Secondary | ICD-10-CM | POA: Diagnosis not present

## 2015-03-30 DIAGNOSIS — E1140 Type 2 diabetes mellitus with diabetic neuropathy, unspecified: Secondary | ICD-10-CM | POA: Diagnosis not present

## 2015-03-30 DIAGNOSIS — I110 Hypertensive heart disease with heart failure: Secondary | ICD-10-CM | POA: Diagnosis not present

## 2015-03-30 DIAGNOSIS — Z87891 Personal history of nicotine dependence: Secondary | ICD-10-CM | POA: Diagnosis not present

## 2015-03-30 DIAGNOSIS — Z951 Presence of aortocoronary bypass graft: Secondary | ICD-10-CM | POA: Diagnosis not present

## 2015-03-30 DIAGNOSIS — I5040 Unspecified combined systolic (congestive) and diastolic (congestive) heart failure: Secondary | ICD-10-CM | POA: Diagnosis not present

## 2015-03-30 DIAGNOSIS — I2510 Atherosclerotic heart disease of native coronary artery without angina pectoris: Secondary | ICD-10-CM | POA: Diagnosis not present

## 2015-03-30 DIAGNOSIS — Z48812 Encounter for surgical aftercare following surgery on the circulatory system: Secondary | ICD-10-CM | POA: Diagnosis not present

## 2015-03-30 DIAGNOSIS — Z7982 Long term (current) use of aspirin: Secondary | ICD-10-CM | POA: Diagnosis not present

## 2015-03-30 DIAGNOSIS — E785 Hyperlipidemia, unspecified: Secondary | ICD-10-CM | POA: Diagnosis not present

## 2015-03-30 DIAGNOSIS — Z7901 Long term (current) use of anticoagulants: Secondary | ICD-10-CM | POA: Diagnosis not present

## 2015-04-01 ENCOUNTER — Other Ambulatory Visit: Payer: Self-pay | Admitting: Licensed Clinical Social Worker

## 2015-04-01 ENCOUNTER — Ambulatory Visit (HOSPITAL_COMMUNITY)
Admission: RE | Admit: 2015-04-01 | Discharge: 2015-04-01 | Disposition: A | Discharge status: Home / Self Care | Payer: PPO | Source: Ambulatory Visit | Attending: Cardiology | Admitting: Cardiology

## 2015-04-01 ENCOUNTER — Other Ambulatory Visit (HOSPITAL_COMMUNITY): Payer: Self-pay

## 2015-04-01 DIAGNOSIS — I5022 Chronic systolic (congestive) heart failure: Secondary | ICD-10-CM | POA: Insufficient documentation

## 2015-04-01 LAB — BASIC METABOLIC PANEL
ANION GAP: 8 (ref 5–15)
BUN: 10 mg/dL (ref 6–20)
CO2: 28 mmol/L (ref 22–32)
Calcium: 9.1 mg/dL (ref 8.9–10.3)
Chloride: 104 mmol/L (ref 101–111)
Creatinine, Ser: 1.32 mg/dL — ABNORMAL HIGH (ref 0.61–1.24)
GFR calc Af Amer: >60 mL/min (ref >60)
GFR calc non Af Amer: 55 mL/min — ABNORMAL LOW (ref >60)
GLUCOSE: 354 mg/dL — AB (ref 65–99)
POTASSIUM: 4.5 mmol/L (ref 3.5–5.1)
Sodium: 140 mmol/L (ref 135–145)

## 2015-04-01 LAB — DIGOXIN LEVEL: Digoxin Level: 1.7 ng/mL (ref 0.8–2.0)

## 2015-04-01 MED FILL — LOSARTAN POTASSIUM 25 MG TA: 25 | 30 days supply | Qty: 30 | Fill #0

## 2015-04-01 MED FILL — ATORVASTATIN 80 MG TABLET: 80 | 30 days supply | Qty: 30 | Fill #0

## 2015-04-01 MED FILL — AMIODARONE HCL 200 MG TAB: 200 | 30 days supply | Qty: 30 | Fill #0

## 2015-04-01 MED FILL — DIGITEK 125 MCG TABLET: 125 | 30 days supply | Qty: 30 | Fill #0

## 2015-04-01 MED FILL — SPIRONOLACTONE 25 MG TABLET: 25 | 30 days supply | Qty: 30 | Fill #0

## 2015-04-01 MED FILL — GABAPENTIN 300 MG CAPSULE: 300 | 30 days supply | Qty: 30 | Fill #0

## 2015-04-01 MED FILL — FUROSEMIDE 40 MG TABLET: 40 | 30 days supply | Qty: 30 | Fill #0

## 2015-04-01 NOTE — Patient Outreach (Signed)
[facility]) Care Management  04/01/2015  Oliver PilaDavid M Locatelli Sep 23, 1950 [id number]   Assessment-CSW received call from patient. Patient shares that he received a letter in regards to his Extra Help application. CSW informed patient that he will need to contact [facility] to inquire about document. Patient confirms that he completed [facility] assessment appointment on 03/27/15 and was approved for services. CSW educated patient on how to arrange transportation ride to appointments. Patient shares that he has an upcoming physician appointment on Thursday. CSW encouraged patient to contact [facility] and schedule ride today.  Plan-CSW will complete outreach within two weeks to assess if there are any further social work needs.  Dickie LaBrooke Elica Almas, BSW, MSW, LCSW [facility] Care Management [email or web address] Phone: [phone] Fax: [phone]

## 2015-04-02 ENCOUNTER — Other Ambulatory Visit (HOSPITAL_COMMUNITY): Payer: Self-pay | Admitting: *Deleted

## 2015-04-03 ENCOUNTER — Telehealth: Payer: Self-pay | Admitting: Licensed Clinical Social Worker

## 2015-04-03 NOTE — Telephone Encounter (Signed)
CSW contacted patient to follow up on previous clinic visit. Patient reports he has not had a chance to follow up with Social Security. CSW encouraged patient to call to inquire about possible SSI. Patient appreciative of the call and will follow up with CSW if needed. Bradley BeechJackie Randy Castrejon, LCSW [phone]

## 2015-04-04 ENCOUNTER — Ambulatory Visit: Payer: Self-pay

## 2015-04-04 DIAGNOSIS — I10 Essential (primary) hypertension: Secondary | ICD-10-CM | POA: Diagnosis not present

## 2015-04-04 DIAGNOSIS — E785 Hyperlipidemia, unspecified: Secondary | ICD-10-CM | POA: Diagnosis not present

## 2015-04-04 DIAGNOSIS — Z7901 Long term (current) use of anticoagulants: Secondary | ICD-10-CM | POA: Diagnosis not present

## 2015-04-05 ENCOUNTER — Other Ambulatory Visit: Payer: Self-pay

## 2015-04-05 ENCOUNTER — Other Ambulatory Visit (HOSPITAL_COMMUNITY): Payer: Self-pay

## 2015-04-09 ENCOUNTER — Encounter: Payer: Self-pay | Admitting: Licensed Clinical Social Worker

## 2015-04-09 ENCOUNTER — Ambulatory Visit (HOSPITAL_COMMUNITY)
Admission: RE | Admit: 2015-04-09 | Discharge: 2015-04-09 | Disposition: A | Discharge status: Home / Self Care | Payer: PPO | Source: Ambulatory Visit | Attending: Cardiology | Admitting: Cardiology

## 2015-04-09 VITALS — BP 150/82 | HR 83 | Wt 218.4 lb

## 2015-04-09 DIAGNOSIS — Z8249 Family history of ischemic heart disease and other diseases of the circulatory system: Secondary | ICD-10-CM | POA: Diagnosis not present

## 2015-04-09 DIAGNOSIS — I213 ST elevation (STEMI) myocardial infarction of unspecified site: Secondary | ICD-10-CM

## 2015-04-09 DIAGNOSIS — Z794 Long term (current) use of insulin: Secondary | ICD-10-CM | POA: Insufficient documentation

## 2015-04-09 DIAGNOSIS — Z951 Presence of aortocoronary bypass graft: Secondary | ICD-10-CM | POA: Diagnosis not present

## 2015-04-09 DIAGNOSIS — E119 Type 2 diabetes mellitus without complications: Secondary | ICD-10-CM | POA: Diagnosis not present

## 2015-04-09 DIAGNOSIS — I4901 Ventricular fibrillation: Secondary | ICD-10-CM | POA: Diagnosis not present

## 2015-04-09 DIAGNOSIS — Z79899 Other long term (current) drug therapy: Secondary | ICD-10-CM | POA: Diagnosis not present

## 2015-04-09 DIAGNOSIS — Z7901 Long term (current) use of anticoagulants: Secondary | ICD-10-CM | POA: Diagnosis not present

## 2015-04-09 DIAGNOSIS — I5022 Chronic systolic (congestive) heart failure: Secondary | ICD-10-CM | POA: Diagnosis not present

## 2015-04-09 DIAGNOSIS — Z8674 Personal history of sudden cardiac arrest: Secondary | ICD-10-CM | POA: Diagnosis not present

## 2015-04-09 DIAGNOSIS — I2510 Atherosclerotic heart disease of native coronary artery without angina pectoris: Secondary | ICD-10-CM | POA: Diagnosis not present

## 2015-04-09 DIAGNOSIS — Z87891 Personal history of nicotine dependence: Secondary | ICD-10-CM | POA: Insufficient documentation

## 2015-04-09 DIAGNOSIS — I255 Ischemic cardiomyopathy: Secondary | ICD-10-CM | POA: Diagnosis not present

## 2015-04-09 DIAGNOSIS — Z833 Family history of diabetes mellitus: Secondary | ICD-10-CM | POA: Diagnosis not present

## 2015-04-09 DIAGNOSIS — I513 Intracardiac thrombosis, not elsewhere classified: Secondary | ICD-10-CM

## 2015-04-09 MED ORDER — AMIODARONE HCL 200 MG PO TABS: 100.0000 mg | ORAL_TABLET | Freq: Every day | ORAL | Status: DC

## 2015-04-09 MED ORDER — CARVEDILOL 3.125 MG PO TABS: 3.1250 mg | ORAL_TABLET | Freq: Two times a day (BID) | ORAL | Status: DC

## 2015-04-09 NOTE — Progress Notes (Cosign Needed)
Patient ID: Bradley PilaDavid M Benitez, male   DOB: Jul 23, 1950, 65 y.o.   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   [facility] with chest pain. LHC showed 3VD and he underwent CABG. Hospital course was complicated by V fib on 02/05/15 required 9 defibrillations. Loaded on amio. He was not placed on bb due to low output. Placed on eliquis 2.5 mg twice a day per cardiac surgery. Discharge weight 208 pounds.   He returns follow up. Last visit apixaban was increased to 5 mg twice a day.  He was discharged from rehab about 2 weeks ago. Ambulates with a cane. He is managing his own medications. Appetite improving. Denies SOB/PND/Orthopnea/CP. Weight at home 212-217 pounds. He requires [facility] for transportation.   Labs (1/17): K 4.1, creatinine 1.14 Labs (04/01/2015): K 4.5 Creatinine 1.32.   LHC/RHC (12/16): RA mean 11 PA 35/11 PCWP mean 23 CI 2.59   Ost RPDA lesion, 70% stenosed.  1st RPLB lesion, 80% stenosed.  Mid Cx lesion, 80% stenosed.  Prox LAD lesion, 60% stenosed.  Mid LAD lesion, 95% stenosed.  Ost 2nd Diag lesion, 70% stenosed.  Ost Ramus to Ramus lesion, 90% stenosed  Cardiac MRI (1/17): 1. Severe LV systolic dysfunction, EF 26%. Wall motion abnormalities as above. 2. Normal RV size and systolic function . 3. LGE pattern suggestive of infarction in LAD territory. The mid anterior/anteroseptal and apical anterior/septal/inferior wall segments and the true apex do not appear viable (probably not likely to improve function with revascularization). The remainder of the left ventricle appears viable. 4. There is a small LV apical thrombus.  ECHO (1/17): EF 20-25% with Apical thombus.   ROS: All systems negative except as listed in HPI, PMH and Problem List.  SH:    Social History   Social History  . Marital Status: Single    Spouse Name: N/A  . Number of Children: N/A  . Years of Education: N/A   Occupational History  . Not on file.   Social History Main Topics  . Smoking status: Former Smoker -- 3.00 packs/day for 20 years    Types: Cigarettes    Quit date: 01/26/1994  . Smokeless tobacco: Not on file  . Alcohol Use: No  . Drug Use: Yes     Comment: Previously smoked marijuana, none in 7 years  . Sexual Activity: Not on file   Other Topics Concern  . Not on file   Social History Narrative    FH:  Family History  Problem Relation Age of Onset  . Diabetes Mother   . Diabetes Father   . Heart failure Brother     Past Medical History  Diagnosis Date  . DM2 (diabetes mellitus, type 2) (HCC)   . Gangrene (HCC)     10+ years ago    Current Outpatient Prescriptions  Medication Sig Dispense Refill  . acetaminophen (TYLENOL) 500 MG tablet Take 1,000 mg by mouth every 8 (eight) hours as needed for mild pain. Reported on 03/11/2015    . amiodarone (PACERONE) 200 MG tablet Take 1 tablet (200 mg total) by mouth daily. 30 tablet 3  . apixaban (ELIQUIS) 5 MG TABS tablet Take 1 tablet (5 mg total) by mouth 2 (two) times daily. 60 tablet  6  . atorvastatin (LIPITOR) 80 MG tablet Take 1 tablet (80 mg total) by mouth daily at 6 PM. 30 tablet 3  . furosemide (LASIX) 40 MG tablet Take 0.5 tablets (20 mg total) by mouth daily. 30 tablet 3  . gabapentin (NEURONTIN) 300 MG capsule Take 1 capsule (300 mg total) by mouth daily. 30 capsule 3  . insulin detemir (LEVEMIR) 100 UNIT/ML injection Inject 0.2 mLs (20 Units total) into the skin daily. 10 mL 11  . lidocaine (LIDODERM) 5 % Reported on 03/11/2015    . losartan (COZAAR) 25 MG tablet Take 1 tablet (25 mg total) by mouth at bedtime. 30 tablet 3  . spironolactone (ALDACTONE) 25 MG tablet Take 1 tablet (25 mg total) by mouth daily. 30 tablet 3   No current facility-administered medications for this  encounter.    Filed Vitals:   04/09/15 1406  BP: 150/82  Pulse: 83  Weight: 218 lb 6.4 oz (99.066 kg)  SpO2: 100%   PHYSICAL EXAM: General:  Well appearing. No resp difficulty. Ambulated in the clinic. Marland Kitchen  HEENT: normal Neck: supple. JVP flat. Carotids 2+ bilaterally; no bruits. No lymphadenopathy or thryomegaly appreciated. Cor: PMI normal. Regular rate & rhythm. No rubs, gallops or murmurs. Sternal scar Lungs: clear Abdomen: soft, nontender, nondistended. No hepatosplenomegaly. No bruits or masses. Good bowel sounds. Extremities: no cyanosis, clubbing, rash.  RLE trace edema. LLE no edema. Neuro: alert & orientedx3, cranial nerves grossly intact. Moves all 4 extremities w/o difficulty. Affect pleasant.  ASSESSMENT & PLAN: 1. 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  - Cut amio back to 100 mg daily.  He will need regular eye exams.  2. Chronic systolic CHF: EF 16% with regional WMAs by cardiac MRI, ischemic cardiomyopathy. S/P CABG x 4. NYHA II. He is not volume overloaded on exam despite weight gain.  - Continue lasix 20 mg daily and continue KCl to 20 daily.  - Add carvedilol 3.125 mg twice a day.  - Off dig due to elevated dig level  - Continue losartan  25 mg qhs - Continue spironolactone 25 mg daily - Repeat ECHO in a few more months after HF meds optimized.  - Reviewed BMET from March 6th. Renal function was stable.  3. CAD: Diffuse 3 vessel disease, see cath report above. MRI reviewed: LAD territory did not appear to have significant viability. Remainder of LV myocardium did appear viable. He had severe disease in LCx and RCA territory that may benefit from revascularization but suspect LAD territory will not recover. s/p CABG x 4 in 1/17. Suspect ventricular fibrillation arrest 1/10 was scar-mediated as opposed to occlusion of graft, etc.   - Continue apixaban to 5 mg bid. 4. LV mural thrombus: Seen on 02/05/2015. Apixaban started 2.5 mg twice  a day per cardiac surgery.  Not thought to be a good candidate for coumadin (concern about compliance with followup). Last visit apixaban was increased to 5 mg twice daily without difficulty.      Follow up in 4 weeks with Dr Bradley Latch. Refer to Paramedicine to ensure medication accuracy.    Amy Clegg NP-C   2:24 PM

## 2015-04-09 NOTE — Progress Notes (Signed)
CSW referred to follow up on SSI referral and also to assist further with medication assistance options. Patient states he has not been able to call SSA yet about the possibility of SSI eligibility. CSW explained possible options and encouraged patient to call to inquire as well as to contact Extra Help program for assistance with Medicare D. Patient verbalizes understanding of follow up needed and will return call to CSW for further assistance. [name], LCSW [phone]

## 2015-04-09 NOTE — Progress Notes (Signed)
Advanced Heart Failure Medication Review by a Pharmacist  Does the patient  feel that his/her medications are working for him/her?  yes  Has the patient been experiencing any side effects to the medications prescribed?  no  Does the patient measure his/her own blood pressure or blood glucose at home?  no   Does the patient have any problems obtaining medications due to transportation or finances?   yes  Understanding of regimen: good Understanding of indications: good Potential of compliance: good Patient understands to avoid NSAIDs. Patient understands to avoid decongestants.  Issues to address at subsequent visits: Medication Assistance   Pharmacist comments: 65 YO pleasant male presenting to HF clinic with his medication list.  Pt states he has stopped digoxin and KCl after last visit.  Pt denies s/sx of bleeding with eliquis but states he needs samples of eliquis as he has been taking his 30 day supply of 2.5 mg (2 tabs BID) and is almost out.  Pt expresses problems obtaining his insulins as well.  Pt has started the process for patient assistance but did not meet the 3% income requirement spent on prescription medications from [facility] [employer]. Pt is also working with CSW for [employer].  Will provide pt with a 21 day supply of Eliquis samples today.    Time with patient: 10 min  Preparation and documentation time: 5 min  Total time: 15 min

## 2015-04-09 NOTE — Patient Instructions (Signed)
Follow up in 4 weeks  Take amiodarone 100 mg daily  Take carvedilol 3.125 mg twice a day.

## 2015-04-10 ENCOUNTER — Other Ambulatory Visit (HOSPITAL_COMMUNITY): Payer: Self-pay | Admitting: *Deleted

## 2015-04-10 MED ORDER — APIXABAN 5 MG PO TABS: 5.0000 mg | ORAL_TABLET | Freq: Two times a day (BID) | ORAL | Status: DC

## 2015-04-11 DIAGNOSIS — I5040 Unspecified combined systolic (congestive) and diastolic (congestive) heart failure: Secondary | ICD-10-CM | POA: Diagnosis not present

## 2015-04-11 DIAGNOSIS — I2510 Atherosclerotic heart disease of native coronary artery without angina pectoris: Secondary | ICD-10-CM | POA: Diagnosis not present

## 2015-04-11 DIAGNOSIS — Z7982 Long term (current) use of aspirin: Secondary | ICD-10-CM | POA: Diagnosis not present

## 2015-04-11 DIAGNOSIS — E785 Hyperlipidemia, unspecified: Secondary | ICD-10-CM | POA: Diagnosis not present

## 2015-04-11 DIAGNOSIS — I110 Hypertensive heart disease with heart failure: Secondary | ICD-10-CM | POA: Diagnosis not present

## 2015-04-11 DIAGNOSIS — Z7901 Long term (current) use of anticoagulants: Secondary | ICD-10-CM | POA: Diagnosis not present

## 2015-04-11 DIAGNOSIS — Z48812 Encounter for surgical aftercare following surgery on the circulatory system: Secondary | ICD-10-CM | POA: Diagnosis not present

## 2015-04-11 DIAGNOSIS — E1140 Type 2 diabetes mellitus with diabetic neuropathy, unspecified: Secondary | ICD-10-CM | POA: Diagnosis not present

## 2015-04-11 DIAGNOSIS — Z951 Presence of aortocoronary bypass graft: Secondary | ICD-10-CM | POA: Diagnosis not present

## 2015-04-11 DIAGNOSIS — Z794 Long term (current) use of insulin: Secondary | ICD-10-CM | POA: Diagnosis not present

## 2015-04-11 DIAGNOSIS — Z87891 Personal history of nicotine dependence: Secondary | ICD-10-CM | POA: Diagnosis not present

## 2015-04-12 ENCOUNTER — Other Ambulatory Visit: Payer: Self-pay | Admitting: Pharmacist

## 2015-04-12 NOTE — Patient Outreach (Signed)
[facility]) Care Management  04/12/2015  Oliver PilaDavid M Fruth [birth year] [id number]   [name] [facility] and I called him back this morning.  He reports that the cardiologist increased his Eliquis to 5 mg twice daily and he is concerned about paying for it.    He reports that he is presently using samples that he was given by cardiology and has enough until next Friday.  Patient had also [facility] and I inquired if he had received a letter from [facility] yet regarding his application---he says he has not yet.  Patient also reports that he has not heard from [facility] Patient Assistance to date either.   I called [employer] Patient Assistance [employer] [facility]([employer]MSPAF) to verify application status for patient.  [employer] reports that patient has been denied at this time due to existing insurance coverage.  The representative for [employer] said that if patient submits proof of out-of-pocket prescription drug spend he may be eligible and reports he needs to submit proof of spending at least $193 on prescription out-of-pocket for 2017.    I attempted to call [name] back to relay this information to him and left a HIPAA compliant voice mail message asking for a return call.    [facility] CM Clinical Pharmacist will attempt to reach patient next week if he doesn't return call.    Tommye StandardKevin Inez Rosato, PharmD, [facility] Clinical Pharmacist [facility] [phone]

## 2015-04-15 ENCOUNTER — Other Ambulatory Visit: Payer: Self-pay | Admitting: Pharmacist

## 2015-04-15 NOTE — Patient Outreach (Signed)
[facility]) Care Management  04/15/2015  Bradley PilaDavid M France [birth year] [id number]   Attempted to reach Bradley this morning to discuss [facility] Benitez Bradley Benitez.  No answer, will continue to attempt to reach Bradley.    Bradley StandardKevin Paris Benitez, PharmD, [facility] Clinical Pharmacist [facility] [phone]

## 2015-04-15 NOTE — Patient Outreach (Signed)
Spoke with Mr Bradley Benitez on the phone this afternoon, verified his name and date of birth.   Faxed proof of out-of-pocket to spend to [facility] and let Mr Bradley Benitez know this.    Will continue to follow-up with [employer] regarding application status.   Tommye StandardKevin Malley Hauter, PharmD, [facility] Clinical Pharmacist [facility] [phone]

## 2015-04-15 NOTE — Patient Outreach (Signed)
[facility]) Care Management  04/15/2015  Oliver PilaDavid M Lovecchio 1950-02-10 [id number]   [name] had requested that I contact [facility] to attempt to verify his out-of-pocket spend for the calendar year as it is required for his patient assistance application for Eliquis.  [facility] [address], [place], and [facility] staff reported that they are unable to release a calendar year out-of-pocket spend without Mr Thamas JaegersOldham's signed release.  He will either need to pick it up, or have it mailed to him.

## 2015-04-15 NOTE — Patient Outreach (Signed)
[facility]) Care Management  04/15/2015  Bradley PilaDavid M Benitez 10/21/1950 [id number]   [name] returned [facility] phone call this morning regarding patient assistance with [employer] Patient Assistance Foundation [facility](BMSPAF).    Discussed with patient that BMSPAF has denied patient at this time due to missing proof of out-of-pocket prescription drug spend exceeding 3% of annual income for calendar year 2017.     Advised patient that BMSPAF is requesting either a printout of calendar year 2017 prescription drug spend from his pharmacy(ies) or proof of spend from his insurance company with out-of-pocket prescription drug spend.    Patient reports that [facility] has [facility], and [facility] discussed that a print out from the pharmacy showing total drug spend is what BMSPAF is requesting, not a receipt.    Patient reports that he will try to obtain this.    Tommye StandardKevin Zaidin Blyden, PharmD, [facility] Clinical Pharmacist [facility] [phone]

## 2015-04-16 ENCOUNTER — Other Ambulatory Visit: Payer: Self-pay | Admitting: Licensed Clinical Social Worker

## 2015-04-16 ENCOUNTER — Telehealth (HOSPITAL_COMMUNITY): Payer: Self-pay | Admitting: Pharmacist

## 2015-04-16 NOTE — Patient Outreach (Signed)
[facility](THN) Care Management  04/16/2015  Bradley PilaDavid M Benitez [birth year] [id number]   Assessment-CSW completed outreach to patient on 04/16/15. Patient answers. Patient reports that he cannot talk long as physical therapist will arrive soon. Patient reports that he has not followed up with SSI. Patient was working with CSW Bradley Benitez. Patient reports "I don't think my body will allow me to wait at the social security office all day." Patient encouraged to either contact office or apply online. CSW provided contact information. Patient states that he rather focus on gaining medication assistance before SSI.  Plan-CSW will follow up with patient in two weeks to assess if there are any further social work needs.  Bradley Benitez, BSW, MSW, LCSW [facility] Care [facility] [email or web address] Phone: [phone] Fax: [phone]

## 2015-04-16 NOTE — Telephone Encounter (Signed)
Eliquis BMS patient assistance approved through 01/26/16. Patient informed of approval.   Candelaria Pies K. Bonnye FavaNicolsen, PharmD, BCPS, CPP Clinical Pharmacist Pager: [phone] Phone: [phone] 04/16/2015 2:15 PM

## 2015-04-17 DIAGNOSIS — Z48812 Encounter for surgical aftercare following surgery on the circulatory system: Secondary | ICD-10-CM | POA: Diagnosis not present

## 2015-04-17 DIAGNOSIS — E785 Hyperlipidemia, unspecified: Secondary | ICD-10-CM | POA: Diagnosis not present

## 2015-04-17 DIAGNOSIS — Z951 Presence of aortocoronary bypass graft: Secondary | ICD-10-CM | POA: Diagnosis not present

## 2015-04-17 DIAGNOSIS — Z794 Long term (current) use of insulin: Secondary | ICD-10-CM | POA: Diagnosis not present

## 2015-04-17 DIAGNOSIS — Z87891 Personal history of nicotine dependence: Secondary | ICD-10-CM | POA: Diagnosis not present

## 2015-04-17 DIAGNOSIS — Z7901 Long term (current) use of anticoagulants: Secondary | ICD-10-CM | POA: Diagnosis not present

## 2015-04-17 DIAGNOSIS — I2510 Atherosclerotic heart disease of native coronary artery without angina pectoris: Secondary | ICD-10-CM | POA: Diagnosis not present

## 2015-04-17 DIAGNOSIS — I5040 Unspecified combined systolic (congestive) and diastolic (congestive) heart failure: Secondary | ICD-10-CM | POA: Diagnosis not present

## 2015-04-17 DIAGNOSIS — E1140 Type 2 diabetes mellitus with diabetic neuropathy, unspecified: Secondary | ICD-10-CM | POA: Diagnosis not present

## 2015-04-17 DIAGNOSIS — I110 Hypertensive heart disease with heart failure: Secondary | ICD-10-CM | POA: Diagnosis not present

## 2015-04-17 DIAGNOSIS — Z7982 Long term (current) use of aspirin: Secondary | ICD-10-CM | POA: Diagnosis not present

## 2015-04-19 ENCOUNTER — Other Ambulatory Visit: Payer: Self-pay | Admitting: Pharmacist

## 2015-04-19 NOTE — Patient Outreach (Signed)
[facility]N) Care Management  04/19/2015  Bradley PilaDavid M Ternes [birth year] [id number]   Called [employer] to follow-up on patient's shipment of Eliquis from manufacturer patient assistance.  [employer] representative confirmed dose of Eliquis 5 mg twice daily, and reports that the pharmacy, [facility], has been trying to reach patient to schedule initial shipment.  [employer] representative reports patient can call [facility] at [phone] to schedule initial shipment.    Called patient and left a HIPAA compliant message requesting a call back.     Patient returned call at 1153 on 04/19/15.  [facility] Pharmac[facility] advised patient of above information and patient reports that he will call [facility] to set up his initial shipment of Eliquis.  He verbalized understanding and denied questions.    Will follow-up with patient next week to see if he received Eliquis.    Tommye StandardKevin Neidy Guerrieri, PharmD, [facility] PlanoBCACP Clinical Pharmacist [facility] [phone]

## 2015-04-23 ENCOUNTER — Ambulatory Visit: Payer: Self-pay | Admitting: *Deleted

## 2015-04-24 DIAGNOSIS — I2510 Atherosclerotic heart disease of native coronary artery without angina pectoris: Secondary | ICD-10-CM | POA: Diagnosis not present

## 2015-04-24 DIAGNOSIS — Z7901 Long term (current) use of anticoagulants: Secondary | ICD-10-CM | POA: Diagnosis not present

## 2015-04-24 DIAGNOSIS — E1140 Type 2 diabetes mellitus with diabetic neuropathy, unspecified: Secondary | ICD-10-CM | POA: Diagnosis not present

## 2015-04-24 DIAGNOSIS — E785 Hyperlipidemia, unspecified: Secondary | ICD-10-CM | POA: Diagnosis not present

## 2015-04-24 DIAGNOSIS — Z951 Presence of aortocoronary bypass graft: Secondary | ICD-10-CM | POA: Diagnosis not present

## 2015-04-24 DIAGNOSIS — Z87891 Personal history of nicotine dependence: Secondary | ICD-10-CM | POA: Diagnosis not present

## 2015-04-24 DIAGNOSIS — Z48812 Encounter for surgical aftercare following surgery on the circulatory system: Secondary | ICD-10-CM | POA: Diagnosis not present

## 2015-04-24 DIAGNOSIS — Z794 Long term (current) use of insulin: Secondary | ICD-10-CM | POA: Diagnosis not present

## 2015-04-24 DIAGNOSIS — I110 Hypertensive heart disease with heart failure: Secondary | ICD-10-CM | POA: Diagnosis not present

## 2015-04-24 DIAGNOSIS — I5040 Unspecified combined systolic (congestive) and diastolic (congestive) heart failure: Secondary | ICD-10-CM | POA: Diagnosis not present

## 2015-04-24 DIAGNOSIS — Z7982 Long term (current) use of aspirin: Secondary | ICD-10-CM | POA: Diagnosis not present

## 2015-04-26 ENCOUNTER — Other Ambulatory Visit: Payer: Self-pay | Admitting: Pharmacist

## 2015-04-26 ENCOUNTER — Ambulatory Visit: Payer: Self-pay | Admitting: Pharmacist

## 2015-04-26 MED FILL — SPIRONOLACTONE 25 MG TABLET: 25 | 30 days supply | Qty: 30 | Fill #1

## 2015-04-26 MED FILL — GABAPENTIN 300 MG CAPSULE: 300 | 30 days supply | Qty: 30 | Fill #1

## 2015-04-26 MED FILL — ATORVASTATIN 80 MG TABLET: 80 | 30 days supply | Qty: 30 | Fill #1

## 2015-04-26 MED FILL — CARVEDILOL 3.125 MG TABLET: 3.125 | 30 days supply | Qty: 60 | Fill #0

## 2015-04-26 MED FILL — FUROSEMIDE 40 MG TABLET: 40 | 30 days supply | Qty: 30 | Fill #1

## 2015-04-26 MED FILL — AMIODARONE HCL 200 MG TAB: 200 | 30 days supply | Qty: 30 | Fill #1

## 2015-04-26 MED FILL — LOSARTAN POTASSIUM 25 MG TA: 25 | 30 days supply | Qty: 30 | Fill #1

## 2015-04-26 NOTE — Patient Outreach (Signed)
[facility]) Care Management  [facility]   04/26/2015  AZAHEL BELCASTRO 1950/03/05 [id number]  Subjective:  [facility] Pharmacist followed-up with SSA Extra Help application today and was told patient has to call to verify status of application.  Called patient and provided phone number for SSA, [phone].    Patient called pharmacist back and said that [facility] Extra Help application was incomplete and he has an appointment for SSA to call him Tuesday 04/30/15 to finish it.    Patient reports that he is in need of various refills and not sure what to do.  Patient reports he has 4 pens of Levemir remaining and asked if there is a patient assistance program for it.  He says that he thinks [facility] filled Eliquis and he doesn't remember picking it up.  He had questions about other patient assistance programs and how they work.  Objective:   Current Medications: Current Outpatient Prescriptions  Medication Sig Dispense Refill  . acetaminophen (TYLENOL) 500 MG tablet Take 1,000 mg by mouth every 8 (eight) hours as needed for mild pain. Reported on 03/11/2015    . amiodarone (PACERONE) 200 MG tablet Take 0.5 tablets (100 mg total) by mouth daily. 30 tablet 3  . apixaban (ELIQUIS) 5 MG TABS tablet Take 1 tablet (5 mg total) by mouth 2 (two) times daily. 60 tablet 11  . atorvastatin (LIPITOR) 80 MG tablet Take 1 tablet (80 mg total) by mouth daily at 6 PM. 30 tablet 3  . carvedilol (COREG) 3.125 MG tablet Take 1 tablet (3.125 mg total) by mouth 2 (two) times daily with a meal. 60 tablet 6  . furosemide (LASIX) 40 MG tablet Take 0.5 tablets (20 mg total) by mouth daily. 30 tablet 3  . gabapentin (NEURONTIN) 300 MG capsule Take 1 capsule (300 mg total) by mouth daily. 30 capsule 3  . insulin detemir (LEVEMIR) 100 UNIT/ML injection Inject 0.2 mLs (20 Units total) into the skin daily. 10 mL 11  . insulin regular (NOVOLIN R,HUMULIN R) 100 units/mL injection Inject 5-8 Units into the skin 3  (three) times daily before meals.    Marland Kitchen losartan (COZAAR) 25 MG tablet Take 1 tablet (25 mg total) by mouth at bedtime. 30 tablet 3  . spironolactone (ALDACTONE) 25 MG tablet Take 1 tablet (25 mg total) by mouth daily. 30 tablet 3   No current facility-administered medications for this visit.    Functional Status: In your present state of health, do you have any difficulty performing the following activities: 03/11/2015 01/23/2015  Hearing? N N  Vision? Y N  Difficulty concentrating or making decisions? Y N  Walking or climbing stairs? Y N  Dressing or bathing? Y N  Doing errands, shopping? Malvin Johns  Preparing Food and eating ? N -  Using the Toilet? N -  In the past six months, have you accidently leaked urine? N -  Do you have problems with loss of bowel control? N -  Managing your Medications? Y -  Managing your Finances? Y -  Housekeeping or managing your Housekeeping? Y -    Fall/Depression Screening: PHQ 2/9 Scores 03/26/2015 03/11/2015 02/22/2015  PHQ - 2 Score 0 0 0    Assessment:  Advised patient he should call [facility] pharmacy to determine if Eliquis was filled there.  He confirmed that he did receive Eliquis from manufacturer patient assistance program.   Patient was encouraged to complete the Extra Help application next week during his call with Social Security  Administration [facility](SSA).    Patient explained that he needs to refill amiodarone, atorvastatin, carvedilol, furosemide, gabapentin, losartan, spironolactone and wasn't sure how.    Discussed with patient each manufacturer sets its own rules for eligibility for patient assistance.  [employer] PontExplained [employer] has a patient assistance program for Levemir but requires denial from [facility] Extra Help.    Plan:  1) Patient plans to complete [employer] Extra Help application over phone next week with SSA.  2) Patient was counseled how to obtain refills from his pharmacy of choice---he agrees he will call in his refills to pharmacy.  3)  Pharmacist will follow-up next week with patient regarding Extra Help application---it may take 2-4 weeks or longer to process once completed.  Tommye StandardKevin Raechell Singleton, PharmD, [facility]BCACP Clinical Pharmacist [facility] [phone]

## 2015-05-01 DIAGNOSIS — Z7982 Long term (current) use of aspirin: Secondary | ICD-10-CM | POA: Diagnosis not present

## 2015-05-01 DIAGNOSIS — Z48812 Encounter for surgical aftercare following surgery on the circulatory system: Secondary | ICD-10-CM | POA: Diagnosis not present

## 2015-05-01 DIAGNOSIS — I2510 Atherosclerotic heart disease of native coronary artery without angina pectoris: Secondary | ICD-10-CM | POA: Diagnosis not present

## 2015-05-01 DIAGNOSIS — Z87891 Personal history of nicotine dependence: Secondary | ICD-10-CM | POA: Diagnosis not present

## 2015-05-01 DIAGNOSIS — I110 Hypertensive heart disease with heart failure: Secondary | ICD-10-CM | POA: Diagnosis not present

## 2015-05-01 DIAGNOSIS — I5040 Unspecified combined systolic (congestive) and diastolic (congestive) heart failure: Secondary | ICD-10-CM | POA: Diagnosis not present

## 2015-05-01 DIAGNOSIS — E785 Hyperlipidemia, unspecified: Secondary | ICD-10-CM | POA: Diagnosis not present

## 2015-05-01 DIAGNOSIS — E1140 Type 2 diabetes mellitus with diabetic neuropathy, unspecified: Secondary | ICD-10-CM | POA: Diagnosis not present

## 2015-05-01 DIAGNOSIS — Z7901 Long term (current) use of anticoagulants: Secondary | ICD-10-CM | POA: Diagnosis not present

## 2015-05-01 DIAGNOSIS — Z951 Presence of aortocoronary bypass graft: Secondary | ICD-10-CM | POA: Diagnosis not present

## 2015-05-01 DIAGNOSIS — Z794 Long term (current) use of insulin: Secondary | ICD-10-CM | POA: Diagnosis not present

## 2015-05-08 DIAGNOSIS — Z794 Long term (current) use of insulin: Secondary | ICD-10-CM | POA: Diagnosis not present

## 2015-05-08 DIAGNOSIS — Z7982 Long term (current) use of aspirin: Secondary | ICD-10-CM | POA: Diagnosis not present

## 2015-05-08 DIAGNOSIS — Z951 Presence of aortocoronary bypass graft: Secondary | ICD-10-CM | POA: Diagnosis not present

## 2015-05-08 DIAGNOSIS — Z87891 Personal history of nicotine dependence: Secondary | ICD-10-CM | POA: Diagnosis not present

## 2015-05-08 DIAGNOSIS — E1140 Type 2 diabetes mellitus with diabetic neuropathy, unspecified: Secondary | ICD-10-CM | POA: Diagnosis not present

## 2015-05-08 DIAGNOSIS — Z48812 Encounter for surgical aftercare following surgery on the circulatory system: Secondary | ICD-10-CM | POA: Diagnosis not present

## 2015-05-08 DIAGNOSIS — I5040 Unspecified combined systolic (congestive) and diastolic (congestive) heart failure: Secondary | ICD-10-CM | POA: Diagnosis not present

## 2015-05-08 DIAGNOSIS — E785 Hyperlipidemia, unspecified: Secondary | ICD-10-CM | POA: Diagnosis not present

## 2015-05-08 DIAGNOSIS — I2510 Atherosclerotic heart disease of native coronary artery without angina pectoris: Secondary | ICD-10-CM | POA: Diagnosis not present

## 2015-05-08 DIAGNOSIS — Z7901 Long term (current) use of anticoagulants: Secondary | ICD-10-CM | POA: Diagnosis not present

## 2015-05-08 DIAGNOSIS — I110 Hypertensive heart disease with heart failure: Secondary | ICD-10-CM | POA: Diagnosis not present

## 2015-05-09 ENCOUNTER — Encounter (HOSPITAL_COMMUNITY): Payer: Self-pay

## 2015-05-09 ENCOUNTER — Ambulatory Visit (HOSPITAL_COMMUNITY)
Admission: RE | Admit: 2015-05-09 | Discharge: 2015-05-09 | Disposition: A | Discharge status: Home / Self Care | Payer: PPO | Source: Ambulatory Visit | Attending: Internal Medicine | Admitting: Internal Medicine

## 2015-05-09 VITALS — BP 110/63 | HR 85 | Resp 18 | Wt 221.5 lb

## 2015-05-09 DIAGNOSIS — Z86718 Personal history of other venous thrombosis and embolism: Secondary | ICD-10-CM | POA: Diagnosis not present

## 2015-05-09 DIAGNOSIS — E119 Type 2 diabetes mellitus without complications: Secondary | ICD-10-CM | POA: Insufficient documentation

## 2015-05-09 DIAGNOSIS — Z7901 Long term (current) use of anticoagulants: Secondary | ICD-10-CM | POA: Diagnosis not present

## 2015-05-09 DIAGNOSIS — I213 ST elevation (STEMI) myocardial infarction of unspecified site: Secondary | ICD-10-CM

## 2015-05-09 DIAGNOSIS — I2510 Atherosclerotic heart disease of native coronary artery without angina pectoris: Secondary | ICD-10-CM | POA: Insufficient documentation

## 2015-05-09 DIAGNOSIS — Z87891 Personal history of nicotine dependence: Secondary | ICD-10-CM | POA: Insufficient documentation

## 2015-05-09 DIAGNOSIS — Z79899 Other long term (current) drug therapy: Secondary | ICD-10-CM | POA: Diagnosis not present

## 2015-05-09 DIAGNOSIS — Z794 Long term (current) use of insulin: Secondary | ICD-10-CM | POA: Insufficient documentation

## 2015-05-09 DIAGNOSIS — I513 Intracardiac thrombosis, not elsewhere classified: Secondary | ICD-10-CM

## 2015-05-09 DIAGNOSIS — I110 Hypertensive heart disease with heart failure: Secondary | ICD-10-CM

## 2015-05-09 DIAGNOSIS — Z951 Presence of aortocoronary bypass graft: Secondary | ICD-10-CM | POA: Insufficient documentation

## 2015-05-09 DIAGNOSIS — Z833 Family history of diabetes mellitus: Secondary | ICD-10-CM | POA: Insufficient documentation

## 2015-05-09 DIAGNOSIS — I255 Ischemic cardiomyopathy: Secondary | ICD-10-CM | POA: Insufficient documentation

## 2015-05-09 DIAGNOSIS — I5022 Chronic systolic (congestive) heart failure: Secondary | ICD-10-CM | POA: Insufficient documentation

## 2015-05-09 MED ORDER — FUROSEMIDE 40 MG PO TABS: 40.0000 mg | ORAL_TABLET | Freq: Every day | ORAL | Status: DC

## 2015-05-09 NOTE — Patient Instructions (Signed)
INCREASE Lasix to 40 mg (1 whole tab) once daily.  [facility] Address: [address] ([address] Floor), [place], [place] [address]  Phone: [phone]  Follow up 1 month with Dr. Shirlee LatchMcLean.  Do the following things EVERYDAY: 1) Weigh yourself in the morning before breakfast. Write it down and keep it in a log. 2) Take your medicines as prescribed 3) Eat low salt foods-Limit salt (sodium) to 2000 mg per day.  4) Stay as active as you can everyday 5) Limit all fluids for the day to less than 2 liters

## 2015-05-09 NOTE — Progress Notes (Signed)
Patient ID: Bradley Benitez, male   DOB: 04-15-1950, 65 y.o.   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   Ad[facility] to [facility] the end of December with chest pain. LHC showed 3VD and he underwent CABG. Hospital course was complicated by V fib on 02/05/15 required 9 defibrillations. Loaded on amio. He was not placed on bb due to low output. Placed on eliquis 2.5 mg twice a day per cardiac surgery. Discharge weight 208 pounds.   He returns follow up. Last visit carvedilol was added and dig stopped. Overall feeling ok. Has not started cardiac rehab. He plans to start after Easter. Denies SOB/PND/Orthopnea. Walking some but spends a lot of time at his home computer. Weight at home 217-220 pounds.  No bleeding problems. Appetite improved. He is a vegetarian. Taking all medications.   Labs (1/17): K 4.1, creatinine 1.14 Labs (04/01/2015): K 4.5 Creatinine 1.32. Dig levenl 1.7   LHC/RHC (12/16): RA mean 11 PA 35/11 PCWP mean 23 CI 2.59   Ost RPDA lesion, 70% stenosed.  1st RPLB lesion, 80% stenosed.  Mid Cx lesion, 80% stenosed.  Prox LAD lesion, 60% stenosed.  Mid LAD lesion, 95% stenosed.  Ost 2nd Diag lesion, 70% stenosed.  Ost Ramus to Ramus lesion, 90% stenosed  Cardiac MRI (1/17): 1. Severe LV systolic dysfunction, EF 26%. Wall motion abnormalities as above. 2. Normal RV size and systolic function . 3. LGE pattern suggestive of infarction in LAD territory. The mid anterior/anteroseptal and apical anterior/septal/inferior wall segments and the true apex do not appear viable (probably not likely to improve function with revascularization). The remainder of the left ventricle appears viable. 4. There is a small LV apical thrombus.  ECHO (1/17): EF 20-25% with Apical thombus.   ROS: All  systems negative except as listed in HPI, PMH and Problem List.  SH:  Social History   Social History  . Marital Status: Single    Spouse Name: N/A  . Number of Children: N/A  . Years of Education: N/A   Occupational History  . Not on file.   Social History Main Topics  . Smoking status: Former Smoker -- 3.00 packs/day for 20 years    Types: Cigarettes    Quit date: 01/26/1994  . Smokeless tobacco: Not on file  . Alcohol Use: No  . Drug Use: Yes     Comment: Previously smoked marijuana, none in 7 years  . Sexual Activity: Not on file   Other Topics Concern  . Not on file   Social History Narrative    FH:  Family History  Problem Relation Age of Onset  . Diabetes Mother   . Diabetes Father   . Heart failure Brother     Past Medical History  Diagnosis Date  . DM2 (diabetes mellitus, type 2) (HCC)   . Gangrene (HCC)     10+ years ago    Current Outpatient Prescriptions  Medication Sig Dispense Refill  . acetaminophen (TYLENOL) 500 MG tablet Take 1,000 mg by mouth every 8 (eight) hours as needed for mild pain. Reported on 03/11/2015    . amiodarone (PACERONE) 200 MG tablet Take 0.5 tablets (100 mg total) by mouth daily. 30 tablet 3  . apixaban (ELIQUIS) 5 MG TABS tablet Take 1 tablet (  5 mg total) by mouth 2 (two) times daily. 60 tablet 11  . atorvastatin (LIPITOR) 80 MG tablet Take 1 tablet (80 mg total) by mouth daily at 6 PM. 30 tablet 3  . carvedilol (COREG) 3.125 MG tablet Take 1 tablet (3.125 mg total) by mouth 2 (two) times daily with a meal. 60 tablet 6  . furosemide (LASIX) 40 MG tablet Take 0.5 tablets (20 mg total) by mouth daily. 30 tablet 3  . gabapentin (NEURONTIN) 300 MG capsule Take 1 capsule (300 mg total) by mouth daily. 30 capsule 3  . insulin detemir (LEVEMIR) 100 UNIT/ML injection Inject 0.2 mLs (20 Units total) into the skin daily. 10 mL 11  . insulin regular (NOVOLIN R,HUMULIN R) 100 units/mL injection Inject 5-8 Units into the skin 3 (three)  times daily before meals.    Marland Kitchen. losartan (COZAAR) 25 MG tablet Take 1 tablet (25 mg total) by mouth at bedtime. 30 tablet 3  . spironolactone (ALDACTONE) 25 MG tablet Take 1 tablet (25 mg total) by mouth daily. 30 tablet 3   No current facility-administered medications for this encounter.    Filed Vitals:   05/09/15 1404  BP: 110/63  Pulse: 85  Resp: 18  Weight: 221 lb 8 oz (100.472 kg)  SpO2: 98%   PHYSICAL EXAM: General:  Well appearing. No resp difficulty. Ambulated in the clinic. Marland Kitchen.  HEENT: normal Neck: supple. JVP ~10 . Carotids 2+ bilaterally; no bruits. No lymphadenopathy or thryomegaly appreciated. Cor: PMI normal. Regular rate & rhythm. No rubs, gallops or murmurs. Sternal scar Lungs: clear Abdomen: soft, nontender, nondistended. No hepatosplenomegaly. No bruits or masses. Good bowel sounds. Extremities: no cyanosis, clubbing, rash.  RLE and LLE 2+ edema.  Neuro: alert & orientedx3, cranial nerves grossly intact. Moves all 4 extremities w/o difficulty. Affect pleasant.  ASSESSMENT & PLAN: 1. 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  - Cut amio back to 100 mg daily.  He will need regular eye exams.  2. Chronic systolic CHF: EF 40%26% with regional WMAs by cardiac MRI, ischemic cardiomyopathy. S/P CABG x 4. NYHA II.  Mild volume overload today. Increase lasix 40 mg daily and continue KCl to 20 daily.  - Continue carvedilol 3.125 mg twice a day.  - Off dig due to elevated dig level  - Continue losartan  25 mg qhs - Continue spironolactone 25 mg dailyd - Repeat ECHO next visit.   3. CAD: Diffuse 3 vessel disease, see cath report above. MRI reviewed: LAD territory did not appear to have significant viability. Remainder of LV myocardium did appear viable. He had severe disease in LCx and RCA territory that may benefit from revascularization but suspect LAD territory will not recover. s/p CABG x 4 in 1/17. Suspect ventricular fibrillation arrest 1/10  was scar-mediated as opposed to occlusion of graft, etc.   - Continue apixaban to 5 mg bid. On statin and bb.  4. LV mural thrombus: Seen on 02/05/2015. Apixaban 5 mg twice a day. Not thought to be a good candidate for coumadin (concern about compliance with followup).    Follow up in 4 weeks with an ECHO and Dr Shirlee LatchMcLean.   Amy Clegg NP-C   1:57 PM

## 2015-05-13 ENCOUNTER — Other Ambulatory Visit: Payer: Self-pay | Admitting: Licensed Clinical Social Worker

## 2015-05-13 NOTE — Patient Outreach (Signed)
[facility](THN) Care Management  05/13/2015  Oliver PilaDavid M Wik 19-Apr-1950 [id number]   Assessment-CSW completed outreach to patient to complete social work discharge. CSW completed outreach to both numbers of patient but as unable to reach him. HIPPA compliant voice message left.   Plan-CSW will complete additional outreach by 05/17/15.  Dickie LaBrooke Fantasia Jinkins, BSW, MSW, LCSW [facility] Care Management [email or web address] Phone: [phone] Fax: [phone]

## 2015-05-17 ENCOUNTER — Other Ambulatory Visit: Payer: Self-pay | Admitting: Pharmacist

## 2015-05-17 ENCOUNTER — Other Ambulatory Visit: Payer: Self-pay | Admitting: Licensed Clinical Social Worker

## 2015-05-17 NOTE — Patient Outreach (Signed)
  [facility]N) Care Management  05/17/2015  Bradley Benitez 1950-10-16 [id number]   Assessment-CSW completed outreach to patient to complete social work discharge. Patient unable to be reached. Patient's mobile number is not currently working. CSW completed outreach to patient's home number but it went directly to voice message. CSW left a HIPPA compliant voice message encouraging return call.  Plan-CSW will complete additional outreach within one week to patient.  [name], BSW, MSW, LCSW [facility] Care Management [name] Phone: [phone] Fax: [phone]

## 2015-05-17 NOTE — Patient Outreach (Signed)
[facility]N) Care Management  05/17/2015  Oliver PilaDavid M Eagon 01/16/1951 [id number]  [facility] Pharmacist attempted to reach patient today to follow-up on his SSA Extra Help application status with him.   Cell phone was not accepting calls.   Will attempt to reach patient again next week.  Tommye StandardKevin Nimai Burbach, PharmD, [facility] HillBCACP Clinical Pharmacist [facility] [phone]

## 2015-05-22 ENCOUNTER — Other Ambulatory Visit: Payer: Self-pay | Admitting: Licensed Clinical Social Worker

## 2015-05-22 NOTE — Patient Outreach (Signed)
[facility]N) Care Management  05/22/2015  Bradley PilaDavid M Benitez [birth year] [id number]   Assessment-CSW completed second outreach to patient but was unsuccessful in reaching him. CSW left a HIPPA compliant voice message on home number. CSW having difficulty maintaining contact with patient.  Plan-CSW will complete third outreach attempt within one week.  Bradley Benitez, BSW, MSW, LCSW [facility] Care Management [email or web address] Phone: [phone] Fax: [phone]

## 2015-05-23 ENCOUNTER — Other Ambulatory Visit: Payer: Self-pay | Admitting: Pharmacist

## 2015-05-23 NOTE — Patient Outreach (Signed)
[facility]N) Care Management  05/23/2015  Bradley PilaDavid M Huttner 1950/09/22 [id number]  [facility] Pharmacist attempted to reach patient via phone today and was unsuccessful as phone was not accepting calls.   This was second unsuccessful attempt.   Will attempt to reach patient again next week.    Bradley StandardKevin Cordarrell Benitez, PharmD, [facility] CenterBCACP Clinical Pharmacist [facility] [phone]

## 2015-05-24 MED FILL — CARVEDILOL 3.125 MG TABLET: 3.125 | 30 days supply | Qty: 60 | Fill #1

## 2015-05-24 MED FILL — SPIRONOLACTONE 25 MG TABLET: 25 | 30 days supply | Qty: 30 | Fill #2

## 2015-05-24 MED FILL — AMIODARONE HCL 200 MG TAB: 200 | 30 days supply | Qty: 30 | Fill #2

## 2015-05-24 MED FILL — ATORVASTATIN 80 MG TABLET: 80 | 30 days supply | Qty: 30 | Fill #2

## 2015-05-24 MED FILL — LOSARTAN POTASSIUM 25 MG TA: 25 | 30 days supply | Qty: 30 | Fill #2

## 2015-05-24 MED FILL — NovoLOG 100 UNIT/ML SOLN: 100 | 28 days supply | Qty: 10 | Fill #0

## 2015-05-24 MED FILL — GABAPENTIN 300 MG CAPSULE: 300 | 30 days supply | Qty: 30 | Fill #2

## 2015-05-27 ENCOUNTER — Ambulatory Visit (HOSPITAL_COMMUNITY): Payer: PPO | Attending: Cardiovascular Disease

## 2015-05-27 ENCOUNTER — Other Ambulatory Visit: Payer: Self-pay

## 2015-05-27 ENCOUNTER — Other Ambulatory Visit (HOSPITAL_COMMUNITY): Payer: Self-pay | Admitting: Adult Health

## 2015-05-27 DIAGNOSIS — I2510 Atherosclerotic heart disease of native coronary artery without angina pectoris: Secondary | ICD-10-CM | POA: Insufficient documentation

## 2015-05-27 DIAGNOSIS — I110 Hypertensive heart disease with heart failure: Secondary | ICD-10-CM | POA: Diagnosis not present

## 2015-05-27 DIAGNOSIS — Z87891 Personal history of nicotine dependence: Secondary | ICD-10-CM | POA: Insufficient documentation

## 2015-05-27 DIAGNOSIS — R29898 Other symptoms and signs involving the musculoskeletal system: Secondary | ICD-10-CM | POA: Insufficient documentation

## 2015-05-27 DIAGNOSIS — E119 Type 2 diabetes mellitus without complications: Secondary | ICD-10-CM | POA: Insufficient documentation

## 2015-05-27 DIAGNOSIS — I5022 Chronic systolic (congestive) heart failure: Secondary | ICD-10-CM

## 2015-05-27 DIAGNOSIS — I340 Nonrheumatic mitral (valve) insufficiency: Secondary | ICD-10-CM | POA: Diagnosis not present

## 2015-05-27 DIAGNOSIS — Z951 Presence of aortocoronary bypass graft: Secondary | ICD-10-CM | POA: Insufficient documentation

## 2015-05-27 DIAGNOSIS — Z8249 Family history of ischemic heart disease and other diseases of the circulatory system: Secondary | ICD-10-CM | POA: Insufficient documentation

## 2015-05-27 DIAGNOSIS — I509 Heart failure, unspecified: Secondary | ICD-10-CM | POA: Diagnosis not present

## 2015-05-27 MED FILL — ULTICARE SYR 0.5 ML 30GX5/1: 30G X 5/16" | 30 days supply | Qty: 100 | Fill #0

## 2015-05-29 ENCOUNTER — Telehealth: Payer: Self-pay | Admitting: Licensed Clinical Social Worker

## 2015-05-29 NOTE — Telephone Encounter (Signed)
CSW called patient to follow up on previous contacts related to SSI/Extra Help programs to assist with finances and medication assistance. CSW left message for return call. Patient has appointment in the clinic next week on 06/05/15 and CSW will attempt contact at that time. Lasandra BeechJackie Rhiana Morash, LCSW [phone]

## 2015-05-30 ENCOUNTER — Encounter: Payer: Self-pay | Admitting: Licensed Clinical Social Worker

## 2015-05-30 ENCOUNTER — Other Ambulatory Visit: Payer: Self-pay | Admitting: Licensed Clinical Social Worker

## 2015-05-30 NOTE — Patient Outreach (Signed)
[facility]) Care Management  05/30/2015  Oliver PilaDavid M Turnage Dec 12, 1950 [id number]   Assessment-CSW completed third outreach to patient but was unsuccessful in reaching him. CSW left HIPPA compliant voice message requesting patient to contact CSW if still interested in [facility] social [facility] assistance. CSW will complete social work discharge at this time due to three unsuccessful outreach attempts.   Plan-CSW will update [facility] Pharmacist of social work discharge.  Dickie LaBrooke Zaidyn Claire, BSW, MSW, LCSW [facility] Care Management [email or web address] Phone: [phone] Fax: [phone]

## 2015-06-04 ENCOUNTER — Other Ambulatory Visit: Payer: Self-pay | Admitting: Pharmacist

## 2015-06-04 NOTE — Patient Outreach (Signed)
[facility]) Care Management  06/04/2015  Oliver PilaDavid M Cong 03-18-1950 [id number]  [facility] Pharmacist attempted to reach patient via phone today and was unsuccessful as phone was not accepting calls.   This was third unsuccessful attempt.   Will make a final attempt to reach patient via phone with in about 1-2 weeks.  Tommye StandardKevin Ceaira Ernster, PharmD, [facility]BCACP Clinical Pharmacist [facility] [phone]

## 2015-06-05 ENCOUNTER — Encounter (HOSPITAL_COMMUNITY): Payer: Self-pay

## 2015-06-05 ENCOUNTER — Ambulatory Visit (HOSPITAL_COMMUNITY)
Admission: RE | Admit: 2015-06-05 | Discharge: 2015-06-05 | Disposition: A | Discharge status: Home / Self Care | Payer: PPO | Source: Ambulatory Visit | Attending: Cardiology | Admitting: Cardiology

## 2015-06-05 ENCOUNTER — Telehealth (HOSPITAL_COMMUNITY): Payer: Self-pay | Admitting: Vascular Surgery

## 2015-06-05 VITALS — BP 134/64 | HR 63 | Wt 221.0 lb

## 2015-06-05 DIAGNOSIS — Z7901 Long term (current) use of anticoagulants: Secondary | ICD-10-CM | POA: Insufficient documentation

## 2015-06-05 DIAGNOSIS — I2510 Atherosclerotic heart disease of native coronary artery without angina pectoris: Secondary | ICD-10-CM | POA: Insufficient documentation

## 2015-06-05 DIAGNOSIS — I255 Ischemic cardiomyopathy: Secondary | ICD-10-CM | POA: Insufficient documentation

## 2015-06-05 DIAGNOSIS — I739 Peripheral vascular disease, unspecified: Secondary | ICD-10-CM | POA: Diagnosis not present

## 2015-06-05 DIAGNOSIS — Z8674 Personal history of sudden cardiac arrest: Secondary | ICD-10-CM | POA: Insufficient documentation

## 2015-06-05 DIAGNOSIS — I213 ST elevation (STEMI) myocardial infarction of unspecified site: Secondary | ICD-10-CM | POA: Diagnosis not present

## 2015-06-05 DIAGNOSIS — Z951 Presence of aortocoronary bypass graft: Secondary | ICD-10-CM

## 2015-06-05 DIAGNOSIS — Z79899 Other long term (current) drug therapy: Secondary | ICD-10-CM | POA: Diagnosis not present

## 2015-06-05 DIAGNOSIS — Z87891 Personal history of nicotine dependence: Secondary | ICD-10-CM | POA: Insufficient documentation

## 2015-06-05 DIAGNOSIS — Z8249 Family history of ischemic heart disease and other diseases of the circulatory system: Secondary | ICD-10-CM | POA: Diagnosis not present

## 2015-06-05 DIAGNOSIS — E119 Type 2 diabetes mellitus without complications: Secondary | ICD-10-CM | POA: Diagnosis not present

## 2015-06-05 DIAGNOSIS — Z833 Family history of diabetes mellitus: Secondary | ICD-10-CM | POA: Diagnosis not present

## 2015-06-05 DIAGNOSIS — I4901 Ventricular fibrillation: Secondary | ICD-10-CM

## 2015-06-05 DIAGNOSIS — Z794 Long term (current) use of insulin: Secondary | ICD-10-CM | POA: Insufficient documentation

## 2015-06-05 DIAGNOSIS — I5022 Chronic systolic (congestive) heart failure: Secondary | ICD-10-CM

## 2015-06-05 DIAGNOSIS — Z86718 Personal history of other venous thrombosis and embolism: Secondary | ICD-10-CM | POA: Insufficient documentation

## 2015-06-05 DIAGNOSIS — I513 Intracardiac thrombosis, not elsewhere classified: Secondary | ICD-10-CM

## 2015-06-05 LAB — BASIC METABOLIC PANEL
Anion gap: 12 (ref 5–15)
BUN: 22 mg/dL — AB (ref 6–20)
CO2: 24 mmol/L (ref 22–32)
CREATININE: 1.44 mg/dL — AB (ref 0.61–1.24)
Calcium: 9.3 mg/dL (ref 8.9–10.3)
Chloride: 106 mmol/L (ref 101–111)
GFR calc Af Amer: 57 mL/min — ABNORMAL LOW (ref >60)
GFR calc non Af Amer: 49 mL/min — ABNORMAL LOW (ref >60)
Glucose, Bld: 99 mg/dL (ref 65–99)
Potassium: 4.6 mmol/L (ref 3.5–5.1)
Sodium: 142 mmol/L (ref 135–145)

## 2015-06-05 LAB — BRAIN NATRIURETIC PEPTIDE: B NATRIURETIC PEPTIDE 5: 287.5 pg/mL — AB (ref 0.0–100.0)

## 2015-06-05 MED ORDER — CARVEDILOL 3.125 MG PO TABS: 6.2500 mg | ORAL_TABLET | Freq: Two times a day (BID) | ORAL | Status: DC

## 2015-06-05 NOTE — Patient Instructions (Addendum)
Increase Coreg to 6.25mg  (2 tablets) twice daily.  Your provider has requested you have peripheral arterial dopplers.  You have been referred to cardiac rehab and EP.  Follow up with Dr.McLean in 1 month.

## 2015-06-05 NOTE — Telephone Encounter (Signed)
Left pt message to make 1 month f/u appt

## 2015-06-06 NOTE — Progress Notes (Signed)
Patient ID: Bradley PilaDavid M Culverhouse, male   DOB: 04-27-1950, 65 y.o.   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   [facility] end of [facility] with chest pain. LHC showed 3VD and he underwent CABG. Hospital course was complicated by V fib on 02/05/15 required 9 defibrillations. Loaded on amio. He was not placed on bb due to low output. Placed on eliquis for LV thrombus due to concerns about compliance with coumadin. Discharge weight 208 pounds.   He returns follow up. Symptoms stable.  Still feels weak. Balance is off when he squats and stands but he is not lightheaded.  He is not very active.  No dyspnea walking on flat ground.  No chest pain.  Digoxin was stopped with elevated level.   Labs (1/17): K 4.1, creatinine 1.14 Labs (2/17): LFTs normal, TSH normal Labs (04/01/2015): K 4.5 Creatinine 1.32. Dig level 1.7   LHC/RHC (12/16): RA mean 11 PA 35/11 PCWP mean 23 CI 2.59   Ost RPDA lesion, 70% stenosed.  1st RPLB lesion, 80% stenosed.  Mid Cx lesion, 80% stenosed.  Prox LAD lesion, 60% stenosed.  Mid LAD lesion, 95% stenosed.  Ost 2nd Diag lesion, 70% stenosed.  Ost Ramus to Ramus lesion, 90% stenosed  Cardiac MRI (1/17): 1. Severe LV systolic dysfunction, EF 26%. Wall motion abnormalities as above. 2. Normal RV size and systolic function . 3. LGE pattern suggestive of infarction in LAD territory. The mid anterior/anteroseptal and apical anterior/septal/inferior wall segments and the true apex do not appear viable (probably not likely to improve function with revascularization). The remainder of the left ventricle appears viable. 4. There is a small LV apical thrombus.  ECHO (1/17): EF 20-25% with apical thombus.  ECHO (5/17): EF 30-35%, no mention of apical thrombus, moderate MR  ROS:  All systems negative except as listed in HPI, PMH and Problem List.  SH:  Social History   Social History  . Marital Status: Single    Spouse Name: N/A  . Number of Children: N/A  . Years of Education: N/A   Occupational History  . Not on file.   Social History Main Topics  . Smoking status: Former Smoker -- 3.00 packs/day for 20 years    Types: Cigarettes    Quit date: 01/26/1994  . Smokeless tobacco: Not on file  . Alcohol Use: No  . Drug Use: Yes     Comment: Previously smoked marijuana, none in 7 years  . Sexual Activity: Not on file   Other Topics Concern  . Not on file   Social History Narrative    FH:  Family History  Problem Relation Age of Onset  . Diabetes Mother   . Diabetes Father   . Heart failure Brother     Past Medical History  Diagnosis Date  . DM2 (diabetes mellitus, type 2) (HCC)   . Gangrene (HCC)     10+ years ago    Current Outpatient Prescriptions  Medication Sig Dispense Refill  . amiodarone (PACERONE) 200 MG tablet Take 0.5 tablets (100 mg total) by mouth daily. 30 tablet 3  . apixaban (ELIQUIS) 5 MG TABS tablet Take 1 tablet (5 mg total) by mouth 2 (two) times daily. 60 tablet 11  . atorvastatin (LIPITOR) 80 MG tablet Take  1 tablet (80 mg total) by mouth daily at 6 PM. 30 tablet 3  . carvedilol (COREG) 3.125 MG tablet Take 2 tablets (6.25 mg total) by mouth 2 (two) times daily with a meal. 120 tablet 6  . furosemide (LASIX) 40 MG tablet Take 1 tablet (40 mg total) by mouth daily. 30 tablet 6  . gabapentin (NEURONTIN) 300 MG capsule Take 1 capsule (300 mg total) by mouth daily. 30 capsule 3  . insulin detemir (LEVEMIR) 100 UNIT/ML injection Inject 0.2 mLs (20 Units total) into the skin daily. 10 mL 11  . insulin regular (NOVOLIN R,HUMULIN R) 100 units/mL injection Inject 5-8 Units into the skin 3 (three) times daily before meals.    Marland Kitchen losartan (COZAAR) 25 MG tablet Take 1 tablet (25 mg total) by mouth at bedtime. 30 tablet 3  .  spironolactone (ALDACTONE) 25 MG tablet Take 1 tablet (25 mg total) by mouth daily. 30 tablet 3   No current facility-administered medications for this encounter.    Filed Vitals:   06/05/15 1159  BP: 134/64  Pulse: 63  Weight: 221 lb (100.245 kg)  SpO2: 100%   PHYSICAL EXAM: General:  Well appearing. No resp difficulty. Ambulated in the clinic. Marland Kitchen  HEENT: normal Neck: supple. JVP 7 cm. Carotids 2+ bilaterally; no bruits. No lymphadenopathy or thryomegaly appreciated. Cor: PMI normal. Regular rate & rhythm. No rubs, gallops or murmurs. Sternal scar.  Unable to palpate pedal pulse. Lungs: clear Abdomen: soft, nontender, nondistended. No hepatosplenomegaly. No bruits or masses. Good bowel sounds. Extremities: no cyanosis, clubbing, rash.  1+ ankle edema.  Neuro: alert & orientedx3, cranial nerves grossly intact. Moves all 4 extremities w/o difficulty. Affect pleasant.  ASSESSMENT & PLAN: 1. 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  - Continue amiodarone 100 mg daily.  LFTs/TSH normal in 2/17.  He will need regular eye exams.  2. Chronic systolic CHF: EF 52% with regional WMAs by cardiac MRI, ischemic cardiomyopathy. S/P CABG x 4. NYHA II-III symptoms.  Repeat echo in 5/17 shows that EF remains low, 30-35%.  He does not appear volume overloaded on exam.  We have been titrating meds slowly due to tendency to tolerate meds poorly.  - Increase Coreg to 6.25 mg bid.  - Off digoxin due to elevated level  - Continue losartan 25 mg qhs - Continue spironolactone 25 mg daily. - Continue current Lasix, check BMET/BNP today.  - I will refer to EP for ICD. He has a narrow QRS so would not qualify for CRT.   3. CAD: Diffuse 3 vessel disease, see cath report above. MRI reviewed: LAD territory did not appear to have significant viability. Remainder of LV myocardium did appear viable. He had severe disease in LCx and RCA territory. s/p CABG x 4 in 1/17. Suspect ventricular  fibrillation arrest 1/10 was scar-mediated as opposed to occlusion of graft, etc.   - No ASA as on apixaban with stable CAD.  - Continue statin.   - I will refer to cardiac rehab.  4. LV mural thrombus: Seen on 1/17 cardiac MRI.  Not reported on most recent echo in 5/17. Continue apixaban 5 mg twice a day. Not thought to be a good candidate for coumadin (concern about compliance with followup).  5. PAD: Weak legs, difficult to palpate pedal pulses.  I will arrange for peripheral arterial dopplers.   Followup in 1 month.   [name] 06/06/2015

## 2015-06-07 ENCOUNTER — Other Ambulatory Visit (HOSPITAL_COMMUNITY): Payer: Self-pay | Admitting: *Deleted

## 2015-06-07 MED ORDER — CARVEDILOL 6.25 MG PO TABS: 6.2500 mg | ORAL_TABLET | Freq: Two times a day (BID) | ORAL | Status: DC

## 2015-06-07 MED FILL — CARVEDILOL 6.25 MG TABLET: 6.25 | 30 days supply | Qty: 60 | Fill #0

## 2015-06-10 ENCOUNTER — Other Ambulatory Visit: Payer: Self-pay | Admitting: Pharmacist

## 2015-06-10 ENCOUNTER — Other Ambulatory Visit (HOSPITAL_COMMUNITY): Payer: Self-pay | Admitting: Cardiology

## 2015-06-10 ENCOUNTER — Encounter: Payer: Self-pay | Admitting: Pharmacist

## 2015-06-10 DIAGNOSIS — R0989 Other specified symptoms and signs involving the circulatory and respiratory systems: Secondary | ICD-10-CM

## 2015-06-10 NOTE — Patient Outreach (Signed)
[facility] Care Management  06/10/2015  Bradley PilaDavid M Harpole 1950-04-10 [id number]   [facility] Pharmacist made a final attempt to reach patient via phone.  Given three unsuccessful attempts to reach patient, will mail patient an Careers adviseroutreach letter.  Plan:  Will mail patient an outreach letter due to unsuccessful attempts to reach patient.  If no response from patient in 10 business days, will close out patient case.   Tommye StandardKevin Setareh Rom, PharmD, [facility] LPBCACP Clinical Pharmacist [facility] [phone]

## 2015-06-11 MED FILL — FREESTYLE LITE TEST STRIP: 33 days supply | Qty: 100 | Fill #0

## 2015-06-18 ENCOUNTER — Ambulatory Visit (INDEPENDENT_AMBULATORY_CARE_PROVIDER_SITE_OTHER): Payer: PPO | Admitting: Internal Medicine

## 2015-06-18 ENCOUNTER — Encounter: Payer: Self-pay | Admitting: Internal Medicine

## 2015-06-18 VITALS — BP 146/70 | HR 58 | Ht 73.0 in | Wt 226.0 lb

## 2015-06-18 DIAGNOSIS — I5022 Chronic systolic (congestive) heart failure: Secondary | ICD-10-CM | POA: Diagnosis not present

## 2015-06-18 NOTE — Progress Notes (Signed)
HPI [name] is referred today by Dr. Shirlee Latch for consideration for ICD implant. He is a pleasant 65 yo man who sustained an AMI several months ago. He had a stormy course with multiple external defib shocks and bypass surgery. He has persistent LV dysfunction. The patient has been stable since. His EF has gone from 25-30% to 30-35%. He has class 2 CHF symptoms. He is on maximal medical therapy. No syncope. Minimal edema.  Allergies  Allergen Reactions  . Codeine Other (See Comments)    unknown     Current Outpatient Prescriptions  Medication Sig Dispense Refill  . amiodarone (PACERONE) 200 MG tablet Take 0.5 tablets (100 mg total) by mouth daily. 30 tablet 3  . apixaban (ELIQUIS) 5 MG TABS tablet Take 1 tablet (5 mg total) by mouth 2 (two) times daily. 60 tablet 11  . atorvastatin (LIPITOR) 80 MG tablet Take 1 tablet (80 mg total) by mouth daily at 6 PM. 30 tablet 3  . carvedilol (COREG) 6.25 MG tablet Take 1 tablet (6.25 mg total) by mouth 2 (two) times daily with a meal. 60 tablet 3  . furosemide (LASIX) 40 MG tablet Take 1 tablet (40 mg total) by mouth daily. 30 tablet 6  . gabapentin (NEURONTIN) 300 MG capsule Take 1 capsule (300 mg total) by mouth daily. 30 capsule 3  . insulin detemir (LEVEMIR) 100 UNIT/ML injection Inject 0.2 mLs (20 Units total) into the skin daily. 10 mL 11  . insulin regular (NOVOLIN R,HUMULIN R) 100 units/mL injection Inject 5-8 Units into the skin 3 (three) times daily before meals.    Marland Kitchen losartan (COZAAR) 25 MG tablet Take 1 tablet (25 mg total) by mouth at bedtime. 30 tablet 3  . spironolactone (ALDACTONE) 25 MG tablet Take 1 tablet (25 mg total) by mouth daily. 30 tablet 3   No current facility-administered medications for this visit.     Past Medical History  Diagnosis Date  . DM2 (diabetes mellitus, type 2) (HCC)   . Gangrene (HCC)     10+ years ago    ROS:   All systems reviewed and negative except as noted in the HPI.   Past  Surgical History  Procedure Laterality Date  . Knee surgery Left   . Cardiac catheterization N/A 01/25/2015    Procedure: Right/Left Heart Cath and Coronary Angiography;  Surgeon: Tonny Bollman, MD;  Location: [facility] INVASIVE CV LAB;  Service: Cardiovascular;  Laterality: N/A;  . Coronary artery bypass graft N/A 02/01/2015    Procedure: CORONARY ARTERY BYPASS GRAFTING (CABG)x 4 using left internal mammary artery and right greater saphenous leg vein using endoscope.;  Surgeon: [name], MD;  Location: [facility] OR;  Service: Open Heart Surgery;  Laterality: N/A;  . Tee without cardioversion N/A 02/01/2015    Procedure: TRANSESOPHAGEAL ECHOCARDIOGRAM (TEE);  Surgeon: [name], MD;  Location: [facility] OR;  Service: Open Heart Surgery;  Laterality: N/A;  . Placement of centrimag ventricular assist device N/A 02/01/2015    Procedure:  CENTRIMAG VENTRICULAR ASSIST DEVICE, back up;  Surgeon: [name], MD;  Location: [facility] OR;  Service: Open Heart Surgery;  Laterality: N/A;     Family History  Problem Relation Age of Onset  . Diabetes Mother   . Diabetes Father   . Heart failure Brother      Social History   Social History  . Marital Status: Single    Spouse Name: N/A  . Number of Children: N/A  .  Years of Education: N/A   Occupational History  . Not on file.   Social History Main Topics  . Smoking status: Former Smoker -- 3.00 packs/day for 20 years    Types: Cigarettes    Quit date: 01/26/1994  . Smokeless tobacco: Not on file  . Alcohol Use: No  . Drug Use: Yes     Comment: Previously smoked marijuana, none in 7 years  . Sexual Activity: Not on file   Other Topics Concern  . Not on file   Social History Narrative     BP 146/70 mmHg  Pulse 58  Ht 6\' 1"  (1.854 m)  Wt 226 lb (102.513 kg)  BMI 29.82 kg/m2  Physical Exam:  Well appearing but diskempt 65 yo man, NAD HEENT: Unremarkable Neck:  No JVD, no thyromegally Lymphatics:  No adenopathy Back:  No CVA  tenderness Lungs:  Clear with no wheezes HEART:  Regular rate rhythm, no murmurs, no rubs, no clicks Abd:  soft, positive bowel sounds, no organomegally, no rebound, no guarding Ext:  2 plus pulses, no edema, no cyanosis, no clubbing Skin:  No rashes no nodules Neuro:  CN II through XII intact, motor grossly intact  EKG - nsr with septal MI  Assess/Plan: 1. Chronic systolic heart failure - his symptoms are well compensated. He will continue his current meds. I have discussed the risks/benefits/goals/expectations of ICD implant and he wishes to proceed. 2. ICM - he denies anginal symptoms, and is s/p CABG. No change in meds. 3. VT - he had VT/VF at the time of his initial presentation. He has had no recurrent episodes but with his LV function, is at risk for more arrhythmias.  Leonia ReevesGregg Taylor,M.D

## 2015-06-18 NOTE — Patient Instructions (Signed)
Medication Instructions:  Your physician recommends that you continue on your current medications as directed. Please refer to the Current Medication list given to you today.   Labwork: None ordered   Testing/Procedures: Your physician has recommended that you have a defibrillator inserted. An implantable cardioverter defibrillator (ICD) is a small device that is placed in your chest or, in rare cases, your abdomen. This device uses electrical pulses or shocks to help control life-threatening, irregular heartbeats that could lead the heart to suddenly stop beating (sudden cardiac arrest). Leads are attached to the ICD that goes into your heart. This is done in the hospital and usually requires an overnight stay. Please see the instruction sheet given to you today for more information.  Dates for procedure: 6/01,6/5,6/8,6/12,6/21,6/23,6/27,6/29  Call Dennis BastKelly Lanier, RN to schedule    Follow-Up: Will schedule follow up after implant date  Any Other Special Instructions Will Be Listed Below (If Applicable).     If you need a refill on your cardiac medications before your next appointment, please call your pharmacy.

## 2015-06-19 ENCOUNTER — Encounter (HOSPITAL_COMMUNITY): Payer: Self-pay

## 2015-06-19 ENCOUNTER — Ambulatory Visit (HOSPITAL_COMMUNITY)
Admission: RE | Admit: 2015-06-19 | Discharge: 2015-06-19 | Disposition: A | Discharge status: Home / Self Care | Payer: PPO | Source: Ambulatory Visit | Attending: Cardiology | Admitting: Cardiology

## 2015-06-19 DIAGNOSIS — R0989 Other specified symptoms and signs involving the circulatory and respiratory systems: Secondary | ICD-10-CM

## 2015-06-19 DIAGNOSIS — E119 Type 2 diabetes mellitus without complications: Secondary | ICD-10-CM | POA: Insufficient documentation

## 2015-06-20 ENCOUNTER — Telehealth: Payer: Self-pay | Admitting: Internal Medicine

## 2015-06-20 NOTE — Telephone Encounter (Signed)
Error

## 2015-06-21 ENCOUNTER — Telehealth: Payer: Self-pay | Admitting: Internal Medicine

## 2015-06-21 DIAGNOSIS — I5021 Acute systolic (congestive) heart failure: Secondary | ICD-10-CM

## 2015-06-21 DIAGNOSIS — I4901 Ventricular fibrillation: Secondary | ICD-10-CM

## 2015-06-21 NOTE — Telephone Encounter (Signed)
[name] is calling to set up surgery. Please call   Thanks

## 2015-06-21 NOTE — Telephone Encounter (Signed)
F/u  Pt following up on scheduling surgery. Please call back and discuss.

## 2015-06-21 NOTE — Telephone Encounter (Signed)
Follow up   Pt wants surgery on the 5th of June he wants rn to call him back today

## 2015-06-25 ENCOUNTER — Encounter: Payer: Self-pay | Admitting: Pharmacist

## 2015-06-25 ENCOUNTER — Other Ambulatory Visit: Payer: Self-pay | Admitting: Pharmacist

## 2015-06-25 NOTE — Patient Outreach (Signed)
[facility] Care Management  06/25/2015  Bradley PilaDavid M Benitez 04-Jun-1950 [id number]  [facility] made three unsuccessful attempts to reach patient via phone and mailed patient outreach letter.  No response from patient after letter sent.    Plan:  Will close pharmacy case at this time and route case closure letter to patient's PCP.    Tommye StandardKevin Abir Eroh, PharmD, [facility]ACP Clinical Pharmacist [facility] [phone]

## 2015-06-26 ENCOUNTER — Other Ambulatory Visit (INDEPENDENT_AMBULATORY_CARE_PROVIDER_SITE_OTHER): Payer: PPO

## 2015-06-26 DIAGNOSIS — I5021 Acute systolic (congestive) heart failure: Secondary | ICD-10-CM | POA: Diagnosis not present

## 2015-06-26 DIAGNOSIS — I4901 Ventricular fibrillation: Secondary | ICD-10-CM | POA: Diagnosis not present

## 2015-06-26 LAB — CBC WITH DIFFERENTIAL/PLATELET
BASOS ABS: 69 {cells}/uL (ref 0–200)
Basophils Relative: 1 %
EOS ABS: 207 {cells}/uL (ref 15–500)
EOS PCT: 3 %
HCT: 36.2 % — ABNORMAL LOW (ref 38.5–50.0)
HEMOGLOBIN: 12.3 g/dL — AB (ref 13.2–17.1)
LYMPHS ABS: 1380 {cells}/uL (ref 850–3900)
Lymphocytes Relative: 20 %
MCH: 28.7 pg (ref 27.0–33.0)
MCHC: 34 g/dL (ref 32.0–36.0)
MCV: 84.6 fL (ref 80.0–100.0)
MONOS PCT: 9 %
MPV: 9.2 fL (ref 7.5–12.5)
Monocytes Absolute: 621 cells/uL (ref 200–950)
NEUTROS PCT: 67 %
Neutro Abs: 4623 cells/uL (ref 1500–7800)
Platelets: 198 10*3/uL (ref 140–400)
RBC: 4.28 MIL/uL (ref 4.20–5.80)
RDW: 14.1 % (ref 11.0–15.0)
WBC: 6.9 10*3/uL (ref 3.8–10.8)

## 2015-06-26 LAB — BASIC METABOLIC PANEL
BUN: 22 mg/dL (ref 7–25)
CALCIUM: 8.8 mg/dL (ref 8.6–10.3)
CHLORIDE: 107 mmol/L (ref 98–110)
CO2: 24 mmol/L (ref 20–31)
CREATININE: 1.6 mg/dL — AB (ref 0.70–1.25)
GLUCOSE: 97 mg/dL (ref 65–99)
Potassium: 4.5 mmol/L (ref 3.5–5.3)
Sodium: 141 mmol/L (ref 135–146)

## 2015-06-26 NOTE — Telephone Encounter (Signed)
Spoke with patient yesterday and let him know 07/01/15 is not available.  He is going to have procedure on 07/04/15 and labs 06/26/15 at 1pm   [facility] on 07/04/15 at 7:30am Do not eat or drink after midnight the night prior to you procedure Expect one night stay in hospital Do not take any medications the morning of your procedure  Will need a follow up appointment 10-14 days from 07/04/15 in device clinic and 3 months from 07/04/15 with Dr Ladona Ridgelaylor

## 2015-06-28 MED FILL — SPIRONOLACTONE 25 MG TABLET: 25 | 30 days supply | Qty: 30 | Fill #3

## 2015-06-28 MED FILL — LOSARTAN POTASSIUM 25 MG TA: 25 | 30 days supply | Qty: 30 | Fill #3

## 2015-06-28 MED FILL — GABAPENTIN 300 MG CAPSULE: 300 | 30 days supply | Qty: 30 | Fill #0

## 2015-06-28 MED FILL — FUROSEMIDE 40 MG TABLET: 40 | 30 days supply | Qty: 30 | Fill #2

## 2015-06-28 MED FILL — ATORVASTATIN 80 MG TABLET: 80 | 30 days supply | Qty: 30 | Fill #3

## 2015-07-01 MED FILL — CARVEDILOL 6.25 MG TABLET: 6.25 | 30 days supply | Qty: 60 | Fill #1

## 2015-07-04 ENCOUNTER — Encounter (HOSPITAL_COMMUNITY): Payer: Self-pay | Admitting: *Deleted

## 2015-07-04 ENCOUNTER — Ambulatory Visit (HOSPITAL_COMMUNITY)
Admission: RE | Admit: 2015-07-04 | Discharge: 2015-07-05 | Disposition: A | Discharge status: Home / Self Care | Payer: PPO | Source: Ambulatory Visit | Attending: Internal Medicine | Admitting: Internal Medicine

## 2015-07-04 ENCOUNTER — Encounter (HOSPITAL_COMMUNITY)
Admission: RE | Disposition: A | Discharge status: Home / Self Care | Payer: Self-pay | Source: Ambulatory Visit | Attending: Internal Medicine

## 2015-07-04 DIAGNOSIS — Z7901 Long term (current) use of anticoagulants: Secondary | ICD-10-CM | POA: Diagnosis not present

## 2015-07-04 DIAGNOSIS — I509 Heart failure, unspecified: Secondary | ICD-10-CM | POA: Diagnosis not present

## 2015-07-04 DIAGNOSIS — I255 Ischemic cardiomyopathy: Secondary | ICD-10-CM | POA: Diagnosis not present

## 2015-07-04 DIAGNOSIS — Z95810 Presence of automatic (implantable) cardiac defibrillator: Secondary | ICD-10-CM

## 2015-07-04 DIAGNOSIS — Z794 Long term (current) use of insulin: Secondary | ICD-10-CM | POA: Insufficient documentation

## 2015-07-04 DIAGNOSIS — I472 Ventricular tachycardia: Secondary | ICD-10-CM | POA: Diagnosis not present

## 2015-07-04 DIAGNOSIS — I2510 Atherosclerotic heart disease of native coronary artery without angina pectoris: Secondary | ICD-10-CM | POA: Diagnosis not present

## 2015-07-04 DIAGNOSIS — E119 Type 2 diabetes mellitus without complications: Secondary | ICD-10-CM | POA: Diagnosis not present

## 2015-07-04 DIAGNOSIS — I5022 Chronic systolic (congestive) heart failure: Secondary | ICD-10-CM | POA: Diagnosis present

## 2015-07-04 HISTORY — PX: EP IMPLANTABLE DEVICE: SHX172B

## 2015-07-04 LAB — GLUCOSE, CAPILLARY
GLUCOSE-CAPILLARY: 139 mg/dL — AB (ref 65–99)
GLUCOSE-CAPILLARY: 178 mg/dL — AB (ref 65–99)
Glucose-Capillary: 127 mg/dL — ABNORMAL HIGH (ref 65–99)
Glucose-Capillary: 105 mg/dL — ABNORMAL HIGH (ref 65–99)

## 2015-07-04 LAB — SURGICAL PCR SCREEN
MRSA, PCR: NEGATIVE
Staphylococcus aureus: NEGATIVE

## 2015-07-04 SURGERY — ICD IMPLANT

## 2015-07-04 MED ORDER — HEPARIN (PORCINE) IN NACL 2-0.9 UNIT/ML-% IJ SOLN
INTRAMUSCULAR | Status: DC | PRN
  Administered 2015-07-04: 10:00:00

## 2015-07-04 MED ORDER — ATORVASTATIN CALCIUM 80 MG PO TABS
80.0000 mg | ORAL_TABLET | Freq: Every day | ORAL | Status: DC
  Administered 2015-07-04: 80 mg via ORAL
  Filled 2015-07-04: qty 1

## 2015-07-04 MED ORDER — SODIUM CHLORIDE 0.9 % IV SOLN
INTRAVENOUS | Status: DC
  Administered 2015-07-04: 09:00:00 via INTRAVENOUS

## 2015-07-04 MED ORDER — CEFAZOLIN SODIUM 1-5 GM-% IV SOLN
1.0000 g | Freq: Four times a day (QID) | INTRAVENOUS | Status: AC
  Administered 2015-07-04 – 2015-07-05 (×3): 1 g via INTRAVENOUS
  Filled 2015-07-04 (×3): qty 50

## 2015-07-04 MED ORDER — CHLORHEXIDINE GLUCONATE 4 % EX LIQD: 60.0000 mL | Freq: Once | CUTANEOUS | Status: DC

## 2015-07-04 MED ORDER — ACETAMINOPHEN 325 MG PO TABS
325.0000 mg | ORAL_TABLET | ORAL | Status: DC | PRN
Start: 2015-07-04 — End: 2015-07-05

## 2015-07-04 MED ORDER — CARVEDILOL 6.25 MG PO TABS
6.2500 mg | ORAL_TABLET | Freq: Two times a day (BID) | ORAL | Status: DC
  Administered 2015-07-04 – 2015-07-05 (×2): 6.25 mg via ORAL
  Filled 2015-07-04 (×2): qty 1

## 2015-07-04 MED ORDER — MUPIROCIN 2 % EX OINT
TOPICAL_OINTMENT | CUTANEOUS | Status: AC
  Administered 2015-07-04: 1
  Filled 2015-07-04: qty 22

## 2015-07-04 MED ORDER — SODIUM CHLORIDE 0.9 % IR SOLN
80.0000 mg | Status: AC
  Administered 2015-07-04: 80 mg

## 2015-07-04 MED ORDER — MUPIROCIN 2 % EX OINT: 1.0000 "application " | TOPICAL_OINTMENT | Freq: Once | CUTANEOUS | Status: DC

## 2015-07-04 MED ORDER — GABAPENTIN 300 MG PO CAPS
300.0000 mg | ORAL_CAPSULE | Freq: Every day | ORAL | Status: DC
  Administered 2015-07-04: 300 mg via ORAL
  Filled 2015-07-04: qty 1

## 2015-07-04 MED ORDER — LIDOCAINE HCL (PF) 1 % IJ SOLN
INTRAMUSCULAR | Status: DC | PRN
  Administered 2015-07-04: 41 mL

## 2015-07-04 MED ORDER — FUROSEMIDE 40 MG PO TABS
40.0000 mg | ORAL_TABLET | Freq: Every day | ORAL | Status: DC
  Administered 2015-07-05: 40 mg via ORAL
  Filled 2015-07-04: qty 1

## 2015-07-04 MED ORDER — AMIODARONE HCL 100 MG PO TABS: 100.0000 mg | ORAL_TABLET | Freq: Every day | ORAL | Status: DC

## 2015-07-04 MED ORDER — MIDAZOLAM HCL 5 MG/5ML IJ SOLN
INTRAMUSCULAR | Status: DC | PRN
  Administered 2015-07-04: 2 mg via INTRAVENOUS
  Administered 2015-07-04 (×3): 1 mg via INTRAVENOUS

## 2015-07-04 MED ORDER — LOSARTAN POTASSIUM 25 MG PO TABS
25.0000 mg | ORAL_TABLET | Freq: Every day | ORAL | Status: DC
  Administered 2015-07-04: 25 mg via ORAL
  Filled 2015-07-04: qty 1

## 2015-07-04 MED ORDER — INSULIN DETEMIR 100 UNIT/ML ~~LOC~~ SOLN
20.0000 [IU] | Freq: Every day | SUBCUTANEOUS | Status: DC
  Administered 2015-07-04: 20 [IU] via SUBCUTANEOUS
  Filled 2015-07-04 (×2): qty 0.2

## 2015-07-04 MED ORDER — INSULIN ASPART 100 UNIT/ML ~~LOC~~ SOLN: 5.0000 [IU] | Freq: Three times a day (TID) | SUBCUTANEOUS | Status: DC

## 2015-07-04 MED ORDER — INSULIN ASPART 100 UNIT/ML ~~LOC~~ SOLN: 0.0000 [IU] | Freq: Three times a day (TID) | SUBCUTANEOUS | Status: DC

## 2015-07-04 MED ORDER — GENTAMICIN SULFATE 40 MG/ML IJ SOLN
INTRAMUSCULAR | Status: AC
  Filled 2015-07-04: qty 2

## 2015-07-04 MED ORDER — HEPARIN (PORCINE) IN NACL 2-0.9 UNIT/ML-% IJ SOLN
INTRAMUSCULAR | Status: AC
  Filled 2015-07-04: qty 500

## 2015-07-04 MED ORDER — APIXABAN 5 MG PO TABS
5.0000 mg | ORAL_TABLET | Freq: Two times a day (BID) | ORAL | Status: DC
  Administered 2015-07-04 – 2015-07-05 (×2): 5 mg via ORAL
  Filled 2015-07-04 (×2): qty 1

## 2015-07-04 MED ORDER — ONDANSETRON HCL 4 MG/2ML IJ SOLN: 4.0000 mg | Freq: Four times a day (QID) | INTRAMUSCULAR | Status: DC | PRN

## 2015-07-04 MED ORDER — FENTANYL CITRATE (PF) 100 MCG/2ML IJ SOLN
INTRAMUSCULAR | Status: AC
  Filled 2015-07-04: qty 2

## 2015-07-04 MED ORDER — CEFAZOLIN SODIUM-DEXTROSE 2-4 GM/100ML-% IV SOLN
INTRAVENOUS | Status: AC
  Filled 2015-07-04: qty 100

## 2015-07-04 MED ORDER — LIDOCAINE HCL (PF) 1 % IJ SOLN
INTRAMUSCULAR | Status: AC
  Filled 2015-07-04: qty 60

## 2015-07-04 MED ORDER — FENTANYL CITRATE (PF) 100 MCG/2ML IJ SOLN
INTRAMUSCULAR | Status: DC | PRN
  Administered 2015-07-04: 25 ug via INTRAVENOUS
  Administered 2015-07-04: 12.5 ug via INTRAVENOUS
  Administered 2015-07-04: 25 ug via INTRAVENOUS

## 2015-07-04 MED ORDER — MIDAZOLAM HCL 5 MG/5ML IJ SOLN
INTRAMUSCULAR | Status: AC
  Filled 2015-07-04: qty 5

## 2015-07-04 MED ORDER — SPIRONOLACTONE 25 MG PO TABS: 25.0000 mg | ORAL_TABLET | Freq: Every day | ORAL | Status: DC

## 2015-07-04 MED ORDER — CEFAZOLIN SODIUM-DEXTROSE 2-4 GM/100ML-% IV SOLN
2.0000 g | INTRAVENOUS | Status: AC
  Administered 2015-07-04: 2 g via INTRAVENOUS

## 2015-07-04 SURGICAL SUPPLY — 6 items
CABLE SURGICAL S-101-97-12 (CABLE) ×2 IMPLANT
ICD ELLIPSE VR CD1411-36C (ICD Generator) ×2 IMPLANT
LEAD DURATA 7122-65CM (Lead) ×2 IMPLANT
PAD DEFIB LIFELINK (PAD) ×2 IMPLANT
SHEATH CLASSIC 8F (SHEATH) ×2 IMPLANT
TRAY PACEMAKER INSERTION (PACKS) ×2 IMPLANT

## 2015-07-04 NOTE — H&P (Signed)
  ICD Criteria  Current LVEF:30%. Within 12 months prior to implant: Yes   Heart failure history: Yes, Class II  Cardiomyopathy history: Yes, Ischemic Cardiomyopathy.  Atrial Fibrillation/Atrial Flutter: No.  Ventricular tachycardia history: Yes, Hemodynamic instability present. VT Type: Sustained Ventricular Tachycardia - Monomorphic.  Cardiac arrest history: Yes, Ventricular Tachycardia.  History of syndromes with risk of sudden death: No.  Previous ICD: no  Current ICD indication: Primary  PPM indication: No.   Class I or II Bradycardia indication present: No  Beta Blocker therapy for 3 or more months: Yes, prescribed.   Ace Inhibitor/ARB therapy for 3 or more months: Yes, prescribed.

## 2015-07-04 NOTE — Progress Notes (Signed)
Visitors in to see. Lunch requested.

## 2015-07-04 NOTE — Interval H&P Note (Signed)
History and Physical Interval Note:  07/04/2015 8:39 AM  Bradley Benitez  has presented today for surgery, with the diagnosis of chf - vfib  The various methods of treatment have been discussed with the patient and family. After consideration of risks, benefits and other options for treatment, the patient has consented to  Procedure(s): ICD Implant (N/A) as a surgical intervention .  The patient's history has been reviewed, patient examined, no change in status, stable for surgery.  I have reviewed the patient's chart and labs.  Questions were answered to the patient's satisfaction.     Lewayne BuntingGregg Chakita Mcgraw

## 2015-07-04 NOTE — Discharge Instructions (Signed)
° ° °  Supplemental Discharge Instructions for  °Pacemaker/Defibrillator Patients ° °Activity °No heavy lifting or vigorous activity with your left/right arm for 6 to 8 weeks.  Do not raise your left/right arm above your head for one week.  Gradually raise your affected arm as drawn below. ° °        °    07/08/15                    07/09/15                    07/10/15                   07/11/15 °__ ° °NO DRIVING for  1 week   ; you may begin driving on  07/11/15  . ° °WOUND CARE °- Keep the wound area clean and dry.  Do not get this area wet for one week. No showers for one week; you may shower on  07/11/15   . °- The tape/steri-strips on your wound will fall off; do not pull them off.  No bandage is needed on the site.  DO  NOT apply any creams, oils, or ointments to the wound area. °- If you notice any drainage or discharge from the wound, any swelling or bruising at the site, or you develop a fever > 101? F after you are discharged home, call the office at once. ° °Special Instructions °- You are still able to use cellular telephones; use the ear opposite the side where you have your pacemaker/defibrillator.  Avoid carrying your cellular phone near your device. °- When traveling through airports, show security personnel your identification card to avoid being screened in the metal detectors.  Ask the security personnel to use the hand wand. °- Avoid arc welding equipment, MRI testing (magnetic resonance imaging), TENS units (transcutaneous nerve stimulators).  Call the office for questions about other devices. °- Avoid electrical appliances that are in poor condition or are not properly grounded. °- Microwave ovens are safe to be near or to operate. ° °Additional information for defibrillator patients should your device go off: °- If your device goes off ONCE and you feel fine afterward, notify the device clinic nurses. °- If your device goes off ONCE and you do not feel well afterward, call 911. °- If your device goes  off TWICE, call 911. °- If your device goes off THREE times in one day, call 911. ° °DO NOT DRIVE YOURSELF OR A FAMILY MEMBER °WITH A DEFIBRILLATOR TO THE HOSPITAL--CALL 911. ° °

## 2015-07-04 NOTE — Discharge Summary (Signed)
ELECTROPHYSIOLOGY PROCEDURE DISCHARGE SUMMARY    Patient ID: Bradley Benitez,  MRN: [id number], DOB/AGE: 67-Oct-1952 67 y.o.  Admit date: 07/04/2015 Discharge date: 07/05/15  Primary Care Physician: Pearson GrippeJames Kim, MD Primary Cardiologist: Dr. Shirlee LatchMcLean Electrophysiologist: Dr. Ladona Ridgelaylor  Primary Discharge Diagnosis:  1. ICM 2. VT/VF (76mo ago at the time of his MI)  Secondary Discharge Diagnosis:  1. CAD 2. CHF, chronic class II symptoms 3. DM 4. Hx of LV thrombus on Eliquis  Allergies  Allergen Reactions  . Codeine Other (See Comments)    Pt does not remember reaction     Procedures This Admission:  1.  Implantation of a STJ single chamber ICD on 07/04/15  by Dr Ladona Ridgelaylor.  The patient received aSt. Jude (serial Number [id number]) ICD, and a Medtronic (serial number [id number]) right ventricular defibrillator lead.  DFT's were deferred at time of implant.  There were no immediate post procedure complications. 2.  CXR on 07/05/15 demonstrated no pneumothorax status post device implantation.   Brief HPI: Bradley Benitez is a 65 y.o. male was referred to electrophysiology in the outpatient setting for consideration of ICD implantation.  Past medical history includes ICM, CAD AMI complicated by VT/VF, HLD, CHF, hx of LV thrombus.  The patient has persistent LV dysfunction despite guideline directed therapy.  Risks, benefits, and alternatives to ICD implantation were reviewed with the patient who wished to proceed.   Hospital Course:  The patient was admitted and underwent implantation of an ICD with details as outlined above. He was monitored on telemetry overnight which demonstrated SB/SR 50's-60's.  Left chest was without hematoma or ecchymosis.  The device was interrogated and found to be functioning normally.  CXR was obtained and demonstrated no pneumothorax status post device implantation.  Wound care, arm mobility, and restrictions were reviewed with the patient.  The patient was examined  By  Dr. Ladona Ridgelaylor and considered stable for discharge to home.   The patient's discharge medications include an ARB (losartan) and beta blocker (coreg).   Physical Exam: Filed Vitals:   07/04/15 1350 07/04/15 2100 07/04/15 2112 07/05/15 0552  BP: 138/66  133/71 130/55  Pulse: 52  60 57  Temp:  98 F (36.7 C)  97.7 F (36.5 C)  TempSrc:  Oral  Oral  Resp: 16   18  Height:      Weight:    221 lb 1.6 oz (100.29 kg)  SpO2: 100% 99%  100%    GEN- The patient is well appearing, alert and oriented x 3 today.   HEENT: normocephalic, atraumatic; sclera clear, conjunctiva pink; hearing intact; oropharynx clear Lungs- Clear to ausculation bilaterally, normal work of breathing.  No wheezes, rales, rhonchi Heart- Regular rate and rhythm, no murmurs, rubs or gallops, PMI not laterally displaced GI- soft, non-tender, non-distended Extremities- no clubbing, cyanosis, or edema MS- no significant deformity or atrophy Skin- warm and dry, no rash or lesion, left chest without hematoma/ecchymosis Psych- euthymic mood, full affect Neuro- no gross defecits  Labs:   Lab Results  Component Value Date   WBC 6.9 06/26/2015   HGB 12.3* 06/26/2015   HCT 36.2* 06/26/2015   MCV 84.6 06/26/2015   PLT 198 06/26/2015   No results for input(s): NA, K, CL, CO2, BUN, CREATININE, CALCIUM, PROT, BILITOT, ALKPHOS, ALT, AST, GLUCOSE in the last 168 hours.  Invalid input(s): LABALBU  Discharge Medications:    Medication List    TAKE these medications        acetaminophen 500  MG tablet  Commonly known as:  TYLENOL  Take 500-1,000 mg by mouth every 6 (six) hours as needed.     amiodarone 200 MG tablet  Commonly known as:  PACERONE  Take 0.5 tablets (100 mg total) by mouth daily with lunch.     apixaban 5 MG Tabs tablet  Commonly known as:  ELIQUIS  Take 1 tablet (5 mg total) by mouth 2 (two) times daily.     atorvastatin 80 MG tablet  Commonly known as:  LIPITOR  Take 1 tablet (80 mg total) by mouth  daily at 6 PM.     BIOFREEZE EX  Apply 1 application topically at bedtime.     carvedilol 6.25 MG tablet  Commonly known as:  COREG  Take 1 tablet (6.25 mg total) by mouth 2 (two) times daily with a meal.     furosemide 40 MG tablet  Commonly known as:  LASIX  Take 1 tablet (40 mg total) by mouth daily.     gabapentin 300 MG capsule  Commonly known as:  NEURONTIN  Take 1 capsule (300 mg total) by mouth daily.     insulin aspart 100 UNIT/ML injection  Commonly known as:  novoLOG  Inject 5-10 Units into the skin 3 (three) times daily before meals. Per sliding scale:     insulin detemir 100 UNIT/ML injection  Commonly known as:  LEVEMIR  Inject 0.2 mLs (20 Units total) into the skin daily.     losartan 25 MG tablet  Commonly known as:  COZAAR  Take 1 tablet (25 mg total) by mouth at bedtime.     OVER THE COUNTER MEDICATION  Place 1 drop into both eyes daily as needed (dry eyes). Over the counter lubricating eye drops from [employer]     spironolactone 25 MG tablet  Commonly known as:  ALDACTONE  Take 1 tablet (25 mg total) by mouth daily.        Disposition:  Home Discharge Instructions    Diet - low sodium heart healthy    Complete by:  As directed      Increase activity slowly    Complete by:  As directed           Follow-up Information    Follow up with [facility] On 07/15/2015.   Specialty:  Cardiology   Why:  2:00PM, wound check   Contact information:   [address], [address] [place] [address] [phone]      Follow up with Lewayne Bunting, MD On 10/07/2015.   Specialty:  Cardiology   Why:  2:15PM   Contact information:   [address] [place] [address] [phone]       Duration of Discharge Encounter: Greater than 30 minutes including physician time.  Norma Fredrickson, PA-C 07/05/2015 10:56 AM   EP Attending  Patient seen and examined. Agree with above.He is stable for  DC. Usual followup. Normal device function and CXR  Keeli Roberg,M.D.

## 2015-07-04 NOTE — Progress Notes (Signed)
Eating lunch 

## 2015-07-05 ENCOUNTER — Ambulatory Visit (HOSPITAL_COMMUNITY): Payer: PPO

## 2015-07-05 DIAGNOSIS — I2510 Atherosclerotic heart disease of native coronary artery without angina pectoris: Secondary | ICD-10-CM | POA: Diagnosis not present

## 2015-07-05 DIAGNOSIS — I517 Cardiomegaly: Secondary | ICD-10-CM | POA: Diagnosis not present

## 2015-07-05 DIAGNOSIS — E119 Type 2 diabetes mellitus without complications: Secondary | ICD-10-CM | POA: Diagnosis not present

## 2015-07-05 DIAGNOSIS — I472 Ventricular tachycardia: Secondary | ICD-10-CM | POA: Diagnosis not present

## 2015-07-05 DIAGNOSIS — I255 Ischemic cardiomyopathy: Secondary | ICD-10-CM | POA: Diagnosis not present

## 2015-07-05 DIAGNOSIS — Z95810 Presence of automatic (implantable) cardiac defibrillator: Secondary | ICD-10-CM | POA: Diagnosis not present

## 2015-07-05 LAB — GLUCOSE, CAPILLARY: Glucose-Capillary: 103 mg/dL — ABNORMAL HIGH (ref 65–99)

## 2015-07-05 MED ORDER — AMIODARONE HCL 200 MG PO TABS: 100.0000 mg | ORAL_TABLET | Freq: Every day | ORAL | Status: DC

## 2015-07-05 MED FILL — Gentamicin Sulfate Inj 40 MG/ML: INTRAMUSCULAR | Qty: 2 | Status: AC

## 2015-07-05 MED FILL — Sodium Chloride Irrigation Soln 0.9%: Qty: 500 | Status: AC

## 2015-07-15 ENCOUNTER — Encounter: Payer: Self-pay | Admitting: Internal Medicine

## 2015-07-15 ENCOUNTER — Ambulatory Visit (INDEPENDENT_AMBULATORY_CARE_PROVIDER_SITE_OTHER): Payer: PPO | Admitting: *Deleted

## 2015-07-15 DIAGNOSIS — I4901 Ventricular fibrillation: Secondary | ICD-10-CM | POA: Diagnosis not present

## 2015-07-15 LAB — CUP PACEART INCLINIC DEVICE CHECK
Date Time Interrogation Session: 20170619155633
HighPow Impedance: 56.25 Ohm
Implantable Lead Implant Date: 20170608
Lead Channel Pacing Threshold Amplitude: 0.5 V
Lead Channel Sensing Intrinsic Amplitude: 11.9 mV
Lead Channel Setting Sensing Sensitivity: 0.5 mV
MDC IDC LEAD LOCATION: 753860
MDC IDC LEAD MODEL: 7122
MDC IDC MSMT BATTERY REMAINING LONGEVITY: 105.6
MDC IDC MSMT LEADCHNL RV IMPEDANCE VALUE: 450 Ohm
MDC IDC MSMT LEADCHNL RV PACING THRESHOLD PULSEWIDTH: 0.5 ms
MDC IDC PG SERIAL: 7359513
MDC IDC SET LEADCHNL RV PACING AMPLITUDE: 3.5 V
MDC IDC SET LEADCHNL RV PACING PULSEWIDTH: 0.5 ms
MDC IDC STAT BRADY RV PERCENT PACED: 0.05 %

## 2015-07-15 NOTE — Progress Notes (Signed)
Wound check appointment. Steri-strips removed. Wound without redness or edema. Incision edges approximated, wound well healed. Normal device function. Threshold, sensing, and impedance consistent with implant measurements. Device programmed at 3.5V for extra safety margin until 3 month visit. Histogram distribution appropriate for patient and level of activity. No ventricular arrhythmias noted. Patient educated about wound care, arm mobility, lifting restrictions, shock plan. ROV 9/11 with GT

## 2015-07-17 ENCOUNTER — Ambulatory Visit (HOSPITAL_COMMUNITY)
Admission: RE | Admit: 2015-07-17 | Discharge: 2015-07-17 | Disposition: A | Discharge status: Home / Self Care | Payer: PPO | Source: Ambulatory Visit | Attending: Cardiology | Admitting: Cardiology

## 2015-07-17 ENCOUNTER — Encounter (HOSPITAL_COMMUNITY): Payer: Self-pay

## 2015-07-17 ENCOUNTER — Encounter: Payer: Self-pay | Admitting: Licensed Clinical Social Worker

## 2015-07-17 VITALS — BP 118/64 | HR 60 | Wt 227.8 lb

## 2015-07-17 DIAGNOSIS — Z79899 Other long term (current) drug therapy: Secondary | ICD-10-CM | POA: Diagnosis not present

## 2015-07-17 DIAGNOSIS — I5022 Chronic systolic (congestive) heart failure: Secondary | ICD-10-CM | POA: Diagnosis not present

## 2015-07-17 DIAGNOSIS — Z951 Presence of aortocoronary bypass graft: Secondary | ICD-10-CM

## 2015-07-17 DIAGNOSIS — I2510 Atherosclerotic heart disease of native coronary artery without angina pectoris: Secondary | ICD-10-CM | POA: Insufficient documentation

## 2015-07-17 DIAGNOSIS — E785 Hyperlipidemia, unspecified: Secondary | ICD-10-CM | POA: Insufficient documentation

## 2015-07-17 DIAGNOSIS — Z7901 Long term (current) use of anticoagulants: Secondary | ICD-10-CM | POA: Insufficient documentation

## 2015-07-17 DIAGNOSIS — E119 Type 2 diabetes mellitus without complications: Secondary | ICD-10-CM | POA: Diagnosis not present

## 2015-07-17 DIAGNOSIS — Z95810 Presence of automatic (implantable) cardiac defibrillator: Secondary | ICD-10-CM | POA: Diagnosis not present

## 2015-07-17 DIAGNOSIS — Z8249 Family history of ischemic heart disease and other diseases of the circulatory system: Secondary | ICD-10-CM | POA: Insufficient documentation

## 2015-07-17 DIAGNOSIS — I255 Ischemic cardiomyopathy: Secondary | ICD-10-CM | POA: Diagnosis not present

## 2015-07-17 DIAGNOSIS — Z87891 Personal history of nicotine dependence: Secondary | ICD-10-CM | POA: Insufficient documentation

## 2015-07-17 DIAGNOSIS — Z833 Family history of diabetes mellitus: Secondary | ICD-10-CM | POA: Insufficient documentation

## 2015-07-17 DIAGNOSIS — Z8674 Personal history of sudden cardiac arrest: Secondary | ICD-10-CM | POA: Insufficient documentation

## 2015-07-17 DIAGNOSIS — Z794 Long term (current) use of insulin: Secondary | ICD-10-CM | POA: Insufficient documentation

## 2015-07-17 LAB — COMPREHENSIVE METABOLIC PANEL
ALT: 22 U/L (ref 17–63)
AST: 17 U/L (ref 15–41)
Albumin: 3.6 g/dL (ref 3.5–5.0)
Alkaline Phosphatase: 56 U/L (ref 38–126)
Anion gap: 8 (ref 5–15)
BILIRUBIN TOTAL: 0.6 mg/dL (ref 0.3–1.2)
BUN: 22 mg/dL — AB (ref 6–20)
CALCIUM: 9.2 mg/dL (ref 8.9–10.3)
CO2: 23 mmol/L (ref 22–32)
Chloride: 109 mmol/L (ref 101–111)
Creatinine, Ser: 1.49 mg/dL — ABNORMAL HIGH (ref 0.61–1.24)
GFR, EST AFRICAN AMERICAN: 55 mL/min — AB (ref >60)
GFR, EST NON AFRICAN AMERICAN: 47 mL/min — AB (ref >60)
GLUCOSE: 127 mg/dL — AB (ref 65–99)
Potassium: 4.3 mmol/L (ref 3.5–5.1)
Sodium: 140 mmol/L (ref 135–145)
TOTAL PROTEIN: 6.4 g/dL — AB (ref 6.5–8.1)

## 2015-07-17 LAB — LIPID PANEL
Cholesterol: 98 mg/dL (ref 0–200)
HDL: 33 mg/dL — AB (ref >40)
LDL CALC: 54 mg/dL (ref 0–99)
Total CHOL/HDL Ratio: 3 RATIO
Triglycerides: 57 mg/dL (ref <150)
VLDL: 11 mg/dL (ref 0–40)

## 2015-07-17 LAB — TSH: TSH: 4.288 u[IU]/mL (ref 0.350–4.500)

## 2015-07-17 MED ORDER — CARVEDILOL 3.125 MG PO TABS: 3.1250 mg | ORAL_TABLET | Freq: Two times a day (BID) | ORAL | Status: DC

## 2015-07-17 MED ORDER — CARVEDILOL 6.25 MG PO TABS: 9.3750 mg | ORAL_TABLET | Freq: Two times a day (BID) | ORAL | Status: DC

## 2015-07-17 MED ORDER — CARVEDILOL 6.25 MG PO TABS: 6.2500 mg | ORAL_TABLET | Freq: Two times a day (BID) | ORAL | Status: DC

## 2015-07-17 MED FILL — CARVEDILOL 3.125 MG TABLET: 3.125 | 30 days supply | Qty: 60 | Fill #0

## 2015-07-17 NOTE — Progress Notes (Signed)
Patient ID: Bradley Benitez, male   DOB: 21-Jan-1951, 65 y.o.   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   [facility] with chest pain. LHC showed 3VD and he underwent CABG. Hospital course was complicated by V fib on 02/05/15 required 9 defibrillations. Loaded on amio. He was not placed on bb due to low output. Placed on eliquis for LV thrombus due to concerns about compliance with coumadin. Discharge weight 208 pounds.   Repeat echo in 5/17 with EF remaining 30-35%.  He had Medtronic ICD placed in 6/17.   He returns follow up. He says that breathing is ok, no significant dyspnea.  No chest pain.  No lightheadedness.  So far, tolerating the current medication regimen.   Labs (1/17): K 4.1, creatinine 1.14 Labs (2/17): LFTs normal, TSH normal Labs (04/01/2015): K 4.5 Creatinine 1.32. Dig level 1.7  Labs (5/17): K 4.5, creatinine 1.6  LHC/RHC (12/16): RA mean 11 PA 35/11 PCWP mean 23 CI 2.59   Ost RPDA lesion, 70% stenosed.  1st RPLB lesion, 80% stenosed.  Mid Cx lesion, 80% stenosed.  Prox LAD lesion, 60% stenosed.  Mid LAD lesion, 95% stenosed.  Ost 2nd Diag lesion, 70% stenosed.  Ost Ramus to Ramus lesion, 90% stenosed  Cardiac MRI (1/17): 1. Severe LV systolic dysfunction, EF 26%. Wall motion abnormalities as above. 2. Normal RV size and systolic function . 3. LGE pattern suggestive of infarction in LAD territory. The mid anterior/anteroseptal and apical anterior/septal/inferior wall segments and the true apex do not appear viable (probably not likely to improve function with revascularization). The remainder of the left ventricle appears viable. 4. There is a small LV apical thrombus.  ECHO (1/17): EF 20-25% with apical thombus.  ECHO (5/17): EF 30-35%, no  mention of apical thrombus, moderate MR Peripheral arterial dopplers (6/17): normal study.   ROS: All systems negative except as listed in HPI, PMH and Problem List.  SH:  Social History   Social History  . Marital Status: Single    Spouse Name: N/A  . Number of Children: N/A  . Years of Education: N/A   Occupational History  . Not on file.   Social History Main Topics  . Smoking status: Former Smoker -- 3.00 packs/day for 20 years    Types: Cigarettes    Quit date: 01/26/1994  . Smokeless tobacco: Not on file  . Alcohol Use: No  . Drug Use: Yes     Comment: Previously smoked marijuana, none in 7 years  . Sexual Activity: Not on file   Other Topics Concern  . Not on file   Social History Narrative    FH:  Family History  Problem Relation Age of Onset  . Diabetes Mother   . Diabetes Father   . Heart failure Brother     Past Medical History  Diagnosis Date  . DM2 (diabetes mellitus, type 2) (HCC)   . Gangrene (HCC)     10+ years ago    Current Outpatient Prescriptions  Medication Sig Dispense Refill  . apixaban (ELIQUIS) 5 MG TABS tablet Take 1 tablet (5 mg total) by mouth 2 (two) times daily. 60 tablet 11  . atorvastatin (LIPITOR) 80 MG tablet Take 1 tablet (80 mg total) by mouth  daily at 6 PM. 30 tablet 3  . carvedilol (COREG) 6.25 MG tablet Take 1 tablet (6.25 mg total) by mouth 2 (two) times daily with a meal. Take along with 3.125 mg tabs to equal 9.375 mg 60 tablet 3  . furosemide (LASIX) 40 MG tablet Take 1 tablet (40 mg total) by mouth daily. 30 tablet 6  . gabapentin (NEURONTIN) 300 MG capsule Take 1 capsule (300 mg total) by mouth daily. 30 capsule 3  . insulin aspart (NOVOLOG) 100 UNIT/ML injection Inject 5-10 Units into the skin 3 (three) times daily before meals. Per sliding scale:    . insulin detemir (LEVEMIR) 100 UNIT/ML injection Inject 0.2 mLs (20 Units total) into the skin daily. (Patient taking differently: Inject 20 Units into the skin at  bedtime. ) 10 mL 11  . losartan (COZAAR) 25 MG tablet Take 1 tablet (25 mg total) by mouth at bedtime. 30 tablet 3  . Menthol, Topical Analgesic, (BIOFREEZE EX) Apply 1 application topically at bedtime.    Marland Kitchen OVER THE COUNTER MEDICATION Place 1 drop into both eyes daily as needed (dry eyes). Over the counter lubricating eye drops from [employer]    . spironolactone (ALDACTONE) 25 MG tablet Take 1 tablet (25 mg total) by mouth daily. 30 tablet 3  . carvedilol (COREG) 3.125 MG tablet Take 1 tablet (3.125 mg total) by mouth 2 (two) times daily. Take along with 6.25 mg tabs to equal 9.375 mg 60 tablet 3   No current facility-administered medications for this encounter.    Filed Vitals:   07/17/15 0953  BP: 118/64  Pulse: 60  Weight: 227 lb 12 oz (103.307 kg)  SpO2: 100%   PHYSICAL EXAM: General:  Well appearing. No resp difficulty. Ambulated in the clinic. Marland Kitchen  HEENT: normal Neck: supple. JVP 7 cm. Carotids 2+ bilaterally; no bruits. No lymphadenopathy or thryomegaly appreciated. Cor: PMI normal. Regular rate & rhythm. No rubs, gallops or murmurs. Sternal scar.  Unable to palpate pedal pulse. Lungs: clear Abdomen: soft, nontender, nondistended. No hepatosplenomegaly. No bruits or masses. Good bowel sounds. Extremities: no cyanosis, clubbing, rash.  No edema.  Neuro: alert & orientedx3, cranial nerves grossly intact. Moves all 4 extremities w/o difficulty. Affect pleasant.  ASSESSMENT & PLAN: 1. 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  - He now has Medtronic ICD.  - I am going to let him stop amiodarone.  Will increase Coreg to 9.375 mg bid.   2. Chronic systolic CHF: EF 47% with regional WMAs by cardiac MRI, ischemic cardiomyopathy. S/P CABG x 4. NYHA II symptoms though not very active.  Repeat echo in 5/17 showed that EF remains low, 30-35%.  Now with Medtronic ICD. He does not appear volume overloaded on exam.  We have been titrating meds slowly due to tendency  to tolerate meds poorly.  - Increase Coreg to 9.375 mg bid.  - Off digoxin due to elevated level  - Continue losartan 25 mg qhs - Continue spironolactone 25 mg daily. - Continue current Lasix, check BMET today.  3. CAD: Diffuse 3 vessel disease, see cath report above. MRI reviewed: LAD territory did not appear to have significant viability. Remainder of LV myocardium did appear viable. He had severe disease in LCx and RCA territory. s/p CABG x 4 in 1/17. Suspect ventricular fibrillation arrest 02/05/15 was scar-mediated as opposed to occlusion of graft, etc.   - No ASA as on apixaban with stable CAD.  - Continue statin.   - I will refer to  cardiac rehab.  4. LV mural thrombus: Seen on 1/17 cardiac MRI.  Not reported on most recent echo in 5/17. Continue apixaban 5 mg twice a day. Not thought to be a good candidate for coumadin (concern about compliance with followup).  5. Hyperlipidemia: Check lipids today.   Followup in 6 wks.   [name] 07/17/2015

## 2015-07-17 NOTE — Progress Notes (Signed)
Paramedicine Multidisciplinary Team Update  Oliver PilaDavid M Centner is enrolled in the [facility].  The patient presents today in association with an Advanced Heart Failure Clinic Appointment.  Patient living/home environment and social support-- Lives alone  Insurance/ Prescription Coverage-- Medicare  Does the patient have a scale and weigh each day? yes Does the patient follow a low salt diet? yes Is patient compliant with medications? Yes and reviews with paramedicine Does the patient have transportation for physician appointments? Drives self Does the patient contact the HF Clinic appropriately with worsening symptoms or weight increases? Patient verbalizes understanding   Do you have an Advanced Directive? Patient has discussed with niece and nephew and plans to complete HPOA with CSW on next visit.  Are there any identified obstacles / challenges for adherence to current treatment plan? Patient states he applied for SSI but was turned down as he is over income by $100.00. Patient states his account is actually much less as his landlord has not cashed rent checks for 3 months. CSW encouraged patient to return and reapply pending landlord cashing his rent checks. CSW will continue to follow for support and care coordination as needed. Lasandra BeechJackie Joven Mom, LCSW [phone]

## 2015-07-17 NOTE — Patient Instructions (Addendum)
Stop Amiodarone  Change Carvedilol to 9.375 mg Twice daily--take 3.125 mg tab and a 6.25 mg tab to equal 9.375 mg  Labs today  You have been referred to Cardiac Rehab  Your physician recommends that you schedule a follow-up appointment in: 6 weeks

## 2015-07-18 ENCOUNTER — Telehealth (HOSPITAL_COMMUNITY): Payer: Self-pay | Admitting: Vascular Surgery

## 2015-07-18 NOTE — Telephone Encounter (Signed)
Left pt to make 6 week f/u

## 2015-07-22 MED FILL — UNIFINE PENTIPS 31GX3/16: 31G X 5 MM | 25 days supply | Qty: 100 | Fill #0

## 2015-07-22 MED FILL — NOVOLOG FLEXPEN SYRINGE: 100 | 40 days supply | Qty: 6 | Fill #0

## 2015-07-29 ENCOUNTER — Other Ambulatory Visit (HOSPITAL_COMMUNITY): Payer: Self-pay | Admitting: Cardiology

## 2015-07-29 MED FILL — SPIRONOLACTONE 25 MG TABLET: 25 | 30 days supply | Qty: 30 | Fill #0

## 2015-07-29 MED FILL — FUROSEMIDE 40 MG TABLET: 40 | 30 days supply | Qty: 30 | Fill #3

## 2015-07-29 MED FILL — ATORVASTATIN 80 MG TABLET: 80 | 30 days supply | Qty: 30 | Fill #0

## 2015-07-29 MED FILL — LOSARTAN POTASSIUM 25 MG TA: 25 | 30 days supply | Qty: 30 | Fill #0

## 2015-07-29 MED FILL — GABAPENTIN 300 MG CAPSULE: 300 | 30 days supply | Qty: 30 | Fill #1

## 2015-07-29 MED FILL — CARVEDILOL 6.25 MG TABLET: 6.25 | 30 days supply | Qty: 60 | Fill #2

## 2015-08-01 ENCOUNTER — Other Ambulatory Visit: Payer: Self-pay | Admitting: Pharmacist

## 2015-08-01 NOTE — Patient Outreach (Signed)
[facility] Care Management  08/01/2015  Oliver PilaDavid M Busbee 12-12-1950 [id number]   Patient had called and left voicemail for [facility], and [facility] Pharmac[facility] attempted to return patient's call.  No answer, left a HIPAA compliant message requesting a return call.    Plan:  Will make a second attempt to reach patient next week if no return call.   Tommye StandardKevin Indy Kuck, PharmD, [facility] Clinical Pharmacist [facility] [phone]

## 2015-08-05 ENCOUNTER — Other Ambulatory Visit: Payer: Self-pay | Admitting: Pharmacist

## 2015-08-05 ENCOUNTER — Encounter: Payer: Self-pay | Admitting: Pharmacist

## 2015-08-05 NOTE — Patient Outreach (Signed)
Second unsuccessful attempt to reach patient.  Left HIPAA compliant voicemail requesting a return call.   Plan:  Will make a third attempt to reach patient within the next week if no return call from patient.   Tommye StandardKevin Juliza Machnik, PharmD, [facility] Clinical Pharmacist [facility] [phone]

## 2015-08-07 ENCOUNTER — Ambulatory Visit: Payer: Self-pay | Admitting: Pharmacist

## 2015-08-07 MED FILL — LEVEMIR FLEXTOUCH 100 UNITS: 100 | 75 days supply | Qty: 15 | Fill #0

## 2015-08-09 ENCOUNTER — Other Ambulatory Visit: Payer: Self-pay | Admitting: Pharmacist

## 2015-08-09 NOTE — Patient Outreach (Signed)
Third attempt to reach patient, initially left a HIPAA compliant message on home phone number listed in chart, then reached patient on cell phone number listed in chart.  Patient answered call and verified  HIPAA details.  Advised patient that [facility] trying to get in touch with patient following his voic[facility] to [facility].    Patient states that he doesn't remember why he called.  He states that he has all of his medications.    Patient states that he will call [facility] if he needs something in the future and he confirmed he has phone number.   Plan:  Will close this case since patient denies pharmacy needs at this time.   Tommye StandardKevin Christne Platts, PharmD, [facility] Clinical Pharmacist [facility] [phone]

## 2015-08-12 MED FILL — UNIFINE PENTIPS 31GX3/16: 31G X 5 MM | 25 days supply | Qty: 100 | Fill #1

## 2015-08-20 MED FILL — CARVEDILOL 3.125 MG TABLET: 3.125 | 30 days supply | Qty: 60 | Fill #1

## 2015-08-27 ENCOUNTER — Encounter: Payer: Self-pay | Admitting: Licensed Clinical Social Worker

## 2015-08-27 ENCOUNTER — Ambulatory Visit (HOSPITAL_COMMUNITY)
Admission: RE | Admit: 2015-08-27 | Discharge: 2015-08-27 | Disposition: A | Discharge status: Home / Self Care | Payer: PPO | Source: Ambulatory Visit | Attending: Cardiology | Admitting: Cardiology

## 2015-08-27 VITALS — BP 142/66 | HR 61 | Wt 232.2 lb

## 2015-08-27 DIAGNOSIS — Z833 Family history of diabetes mellitus: Secondary | ICD-10-CM | POA: Insufficient documentation

## 2015-08-27 DIAGNOSIS — Z79899 Other long term (current) drug therapy: Secondary | ICD-10-CM | POA: Diagnosis not present

## 2015-08-27 DIAGNOSIS — N183 Chronic kidney disease, stage 3 (moderate): Secondary | ICD-10-CM | POA: Diagnosis not present

## 2015-08-27 DIAGNOSIS — Z95810 Presence of automatic (implantable) cardiac defibrillator: Secondary | ICD-10-CM | POA: Diagnosis not present

## 2015-08-27 DIAGNOSIS — Z951 Presence of aortocoronary bypass graft: Secondary | ICD-10-CM | POA: Diagnosis not present

## 2015-08-27 DIAGNOSIS — Z87891 Personal history of nicotine dependence: Secondary | ICD-10-CM | POA: Insufficient documentation

## 2015-08-27 DIAGNOSIS — I2510 Atherosclerotic heart disease of native coronary artery without angina pectoris: Secondary | ICD-10-CM | POA: Diagnosis not present

## 2015-08-27 DIAGNOSIS — Z8249 Family history of ischemic heart disease and other diseases of the circulatory system: Secondary | ICD-10-CM | POA: Diagnosis not present

## 2015-08-27 DIAGNOSIS — I255 Ischemic cardiomyopathy: Secondary | ICD-10-CM | POA: Insufficient documentation

## 2015-08-27 DIAGNOSIS — E1122 Type 2 diabetes mellitus with diabetic chronic kidney disease: Secondary | ICD-10-CM | POA: Diagnosis not present

## 2015-08-27 DIAGNOSIS — Z794 Long term (current) use of insulin: Secondary | ICD-10-CM | POA: Diagnosis not present

## 2015-08-27 DIAGNOSIS — I5022 Chronic systolic (congestive) heart failure: Secondary | ICD-10-CM | POA: Insufficient documentation

## 2015-08-27 DIAGNOSIS — Z7901 Long term (current) use of anticoagulants: Secondary | ICD-10-CM | POA: Insufficient documentation

## 2015-08-27 DIAGNOSIS — E785 Hyperlipidemia, unspecified: Secondary | ICD-10-CM | POA: Insufficient documentation

## 2015-08-27 DIAGNOSIS — Z86718 Personal history of other venous thrombosis and embolism: Secondary | ICD-10-CM | POA: Diagnosis not present

## 2015-08-27 LAB — BASIC METABOLIC PANEL
Anion gap: 7 (ref 5–15)
BUN: 20 mg/dL (ref 6–20)
CALCIUM: 9.4 mg/dL (ref 8.9–10.3)
CHLORIDE: 108 mmol/L (ref 101–111)
CO2: 24 mmol/L (ref 22–32)
CREATININE: 1.36 mg/dL — AB (ref 0.61–1.24)
GFR calc Af Amer: >60 mL/min (ref >60)
GFR calc non Af Amer: 52 mL/min — ABNORMAL LOW (ref >60)
Glucose, Bld: 153 mg/dL — ABNORMAL HIGH (ref 65–99)
Potassium: 4.2 mmol/L (ref 3.5–5.1)
Sodium: 139 mmol/L (ref 135–145)

## 2015-08-27 LAB — CBC
HCT: 37.1 % — ABNORMAL LOW (ref 39.0–52.0)
Hemoglobin: 12.6 g/dL — ABNORMAL LOW (ref 13.0–17.0)
MCH: 28 pg (ref 26.0–34.0)
MCHC: 34 g/dL (ref 30.0–36.0)
MCV: 82.4 fL (ref 78.0–100.0)
PLATELETS: 201 10*3/uL (ref 150–400)
RBC: 4.5 MIL/uL (ref 4.22–5.81)
RDW: 13.1 % (ref 11.5–15.5)
WBC: 6.7 10*3/uL (ref 4.0–10.5)

## 2015-08-27 MED ORDER — SACUBITRIL-VALSARTAN 24-26 MG PO TABS: 1.0000 | ORAL_TABLET | Freq: Two times a day (BID) | ORAL | 3 refills | Status: DC

## 2015-08-27 NOTE — Patient Instructions (Signed)
You have been referred to Cardiac Rehab.  Stop Losartan.  Start Entresto 24/26 mg twice daily.  Routine lab work today. Will notify you of abnormal results  Repeat lab work (bmet) in 2 weeks.   Follow up with Dr.McLean in 6 weeks.

## 2015-08-28 ENCOUNTER — Telehealth (HOSPITAL_COMMUNITY): Payer: Self-pay | Admitting: *Deleted

## 2015-08-28 MED FILL — CARVEDILOL 6.25 MG TABLET: 6.25 | 30 days supply | Qty: 60 | Fill #3

## 2015-08-28 MED FILL — ATORVASTATIN 80 MG TABLET: 80 | 30 days supply | Qty: 30 | Fill #1

## 2015-08-28 MED FILL — ENTRESTO 24 MG-26 MG TABLET: 24-26 | 30 days supply | Qty: 60 | Fill #0

## 2015-08-28 MED FILL — SPIRONOLACTONE 25 MG TABLET: 25 | 30 days supply | Qty: 30 | Fill #1

## 2015-08-28 MED FILL — GABAPENTIN 300 MG CAPSULE: 300 | 30 days supply | Qty: 30 | Fill #2

## 2015-08-28 MED FILL — FUROSEMIDE 40 MG TABLET: 40 | 30 days supply | Qty: 30 | Fill #0

## 2015-08-28 MED FILL — NOVOLOG FLEXPEN SYRINGE: 100 | 40 days supply | Qty: 6 | Fill #1

## 2015-08-28 NOTE — Progress Notes (Signed)
CSW met with patient in the clinic to assist with questions regarding unpaid medical bills. Patient states that he applied for SSI a few months back but was denied due to over resource limit. Patient reports he was $100 over because his landlord had not cashed 3 months worth of rent checks. Patient plans to reapply for SSI which would help with coverage of medical bills. CSW also discussed [facility] discount program to help cover current bills. Patient plans to follow up with both and will return call to CSW if further needs arise. Jackie Brennan, LCSW [phone]   

## 2015-08-28 NOTE — Telephone Encounter (Signed)
Pt called to verify what medication he was suppose to stop when he starts the [facility], advised pt to stop Losartan, he is aware and verbalizes understanding

## 2015-08-28 NOTE — Progress Notes (Signed)
Patient ID: Bradley Benitez, male   DOB: 12/22/1950, 65 y.o.   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   Admitted to [facility] the end of December with chest pain. LHC showed 3VD and he underwent CABG. Hospital course was complicated by V fib on 02/05/15 required 9 defibrillations. Loaded on amio. He was not placed on bb due to low output. Placed on eliquis for LV thrombus due to concerns about compliance with coumadin. Discharge weight 208 pounds.   Repeat echo in 5/17 with EF remaining 30-35%.  He had Medtronic ICD placed in 6/17.   He returns follow up. He says that breathing is ok, no significant dyspnea.  No chest pain.  No lightheadedness.  No orthopnea/PND. So far, tolerating the current medication regimen.   ECG: NSR, poor RWP  Labs (1/17): K 4.1, creatinine 1.14 Labs (2/17): LFTs normal, TSH normal Labs (04/01/2015): K 4.5 Creatinine 1.32. Dig level 1.7  Labs (5/17): K 4.5, creatinine 1.6 Labs (6/17): K 4.3, creatinine 1.49, LDL 54, HDL 33  LHC/RHC (12/16): RA mean 11 PA 35/11 PCWP mean 23 CI 2.59   Ost RPDA lesion, 70% stenosed.  1st RPLB lesion, 80% stenosed.  Mid Cx lesion, 80% stenosed.  Prox LAD lesion, 60% stenosed.  Mid LAD lesion, 95% stenosed.  Ost 2nd Diag lesion, 70% stenosed.  Ost Ramus to Ramus lesion, 90% stenosed  Cardiac MRI (1/17): 1. Severe LV systolic dysfunction, EF 26%. Wall motion abnormalities as above. 2. Normal RV size and systolic function . 3. LGE pattern suggestive of infarction in LAD territory. The mid anterior/anteroseptal and apical anterior/septal/inferior wall segments and the true apex do not appear viable (probably not likely to improve function with revascularization). The remainder of the left ventricle appears viable. 4. There is a small LV apical thrombus.  ECHO (1/17): EF  20-25% with apical thombus.  ECHO (5/17): EF 30-35%, no mention of apical thrombus, moderate MR Peripheral arterial dopplers (6/17): normal study.   ROS: All systems negative except as listed in HPI, PMH and Problem List.  SH:  Social History   Social History  . Marital status: Single    Spouse name: N/A  . Number of children: N/A  . Years of education: N/A   Occupational History  . Not on file.   Social History Main Topics  . Smoking status: Former Smoker    Packs/day: 3.00    Years: 20.00    Types: Cigarettes    Quit date: 01/26/1994  . Smokeless tobacco: Not on file  . Alcohol use No  . Drug use:      Comment: Previously smoked marijuana, none in 7 years  . Sexual activity: Not on file   Other Topics Concern  . Not on file   Social History Narrative  . No narrative on file    FH:  Family History  Problem Relation Age of Onset  . Diabetes Mother   . Diabetes Father   . Heart failure Brother     Past Medical History:  Diagnosis Date  . DM2 (diabetes mellitus, type 2) (HCC)   . Gangrene (HCC)    10+ years ago    Current Outpatient Prescriptions  Medication Sig Dispense Refill  . apixaban (ELIQUIS) 5 MG TABS tablet Take 1 tablet (5 mg total) by mouth 2 (two)  times daily. 60 tablet 11  . atorvastatin (LIPITOR) 80 MG tablet TAKE 1 TABLET BY MOUTH DAILY AT 6 PM. 30 tablet 3  . carvedilol (COREG) 3.125 MG tablet Take 1 tablet (3.125 mg total) by mouth 2 (two) times daily. Take along with 6.25 mg tabs to equal 9.375 mg 60 tablet 3  . carvedilol (COREG) 6.25 MG tablet Take 1 tablet (6.25 mg total) by mouth 2 (two) times daily with a meal. Take along with 3.125 mg tabs to equal 9.375 mg 60 tablet 3  . furosemide (LASIX) 40 MG tablet Take 1 tablet (40 mg total) by mouth daily. 30 tablet 6  . gabapentin (NEURONTIN) 300 MG capsule Take 1 capsule (300 mg total) by mouth daily. 30 capsule 3  . insulin aspart (NOVOLOG) 100 UNIT/ML injection Inject 5-10 Units into the skin  3 (three) times daily before meals. Per sliding scale:    . insulin detemir (LEVEMIR) 100 UNIT/ML injection Inject 0.2 mLs (20 Units total) into the skin daily. (Patient taking differently: Inject 20 Units into the skin at bedtime. ) 10 mL 11  . Menthol, Topical Analgesic, (BIOFREEZE EX) Apply 1 application topically at bedtime.    Marland Kitchen OVER THE COUNTER MEDICATION Place 1 drop into both eyes daily as needed (dry eyes). Over the counter lubricating eye drops from [employer]    . spironolactone (ALDACTONE) 25 MG tablet TAKE 1 TABLET BY MOUTH DAILY. 30 tablet 3  . sacubitril-valsartan (ENTRESTO) 24-26 MG Take 1 tablet by mouth 2 (two) times daily. 60 tablet 3   No current facility-administered medications for this encounter.     Vitals:   08/27/15 1434  BP: (!) 142/66  Pulse: 61  SpO2: 97%  Weight: 232 lb 4 oz (105.3 kg)   PHYSICAL EXAM: General:  Well appearing. No resp difficulty. Ambulated in the clinic. Marland Kitchen  HEENT: normal Neck: supple. JVP 7 cm. Carotids 2+ bilaterally; no bruits. No lymphadenopathy or thryomegaly appreciated. Cor: PMI normal. Regular rate & rhythm. No rubs, gallops or murmurs. Sternal scar.  Unable to palpate pedal pulse. Lungs: clear Abdomen: soft, nontender, nondistended. No hepatosplenomegaly. No bruits or masses. Good bowel sounds. Extremities: no cyanosis, clubbing, rash.  No edema.  Neuro: alert & orientedx3, cranial nerves grossly intact. Moves all 4 extremities w/o difficulty. Affect pleasant.  ASSESSMENT & PLAN: 1. 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  - He now has Medtronic ICD and is off amiodarone.  - Continue Coreg.  2. Chronic systolic CHF: EF 16% with regional WMAs by cardiac MRI, ischemic cardiomyopathy. S/P CABG x 4. NYHA II symptoms.  Repeat echo in 5/17 showed that EF remains low, 30-35%.  Now with Medtronic ICD. He does not appear volume overloaded on exam.  We have been titrating meds slowly due to tendency to tolerate  meds poorly.  - Continue current Coreg.   - Off digoxin due to elevated level  - Stop losartan, start Entresto 24/26 bid. BMET today and again in 2 wks.  - Continue spironolactone 25 mg daily. - Continue current Lasix, check BMET today.  3. CAD: Diffuse 3 vessel disease, see cath report above. MRI reviewed: LAD territory did not appear to have significant viability. Remainder of LV myocardium did appear viable. He had severe disease in LCx and RCA territory. s/p CABG x 4 in 1/17. Suspect ventricular fibrillation arrest 02/05/15 was scar-mediated as opposed to occlusion of graft, etc.   - No ASA as on apixaban with stable CAD.  - Continue statin.   -  I will refer to cardiac rehab.  4. LV mural thrombus: Seen on 1/17 cardiac MRI.  Not reported on most recent echo in 5/17. Continue apixaban 5 mg twice a day. Not thought to be a good candidate for coumadin (concern about compliance with followup). Check CBC.  5. Hyperlipidemia: Good lipids in 6/17.  6. CKD: Stage III.  Follow creatinine closely.   Followup in 6 wks.   [name] 08/28/2015

## 2015-09-02 ENCOUNTER — Telehealth (HOSPITAL_COMMUNITY): Payer: Self-pay | Admitting: Pharmacist

## 2015-09-02 NOTE — Telephone Encounter (Addendum)
Entresto 24-26 mg PO BID PA approved by [employer] Part D through 01/26/16. Also enrolled in [employer] foundation since cannot afford $85/mo copay. Relayed info to Outpatient Pharmacy.   Billing ID: [id number] Person Code: [id number] RX Group: [id number] RX BIN: [id number] PCN for Part D: [id number]    Tyler DeisErika K. Bonnye FavaNicolsen, PharmD, BCPS, CPP Clinical Pharmacist Pager: [phone] Phone: [phone] 09/02/2015 10:34 AM

## 2015-09-12 MED FILL — UNIFINE PENTIPS 31GX3/16: 31G X 5 MM | 25 days supply | Qty: 100 | Fill #2

## 2015-09-13 ENCOUNTER — Ambulatory Visit (HOSPITAL_COMMUNITY)
Admission: RE | Admit: 2015-09-13 | Discharge: 2015-09-13 | Disposition: A | Discharge status: Home / Self Care | Payer: PPO | Source: Ambulatory Visit | Attending: Internal Medicine | Admitting: Internal Medicine

## 2015-09-13 DIAGNOSIS — I5023 Acute on chronic systolic (congestive) heart failure: Secondary | ICD-10-CM

## 2015-09-13 DIAGNOSIS — I5022 Chronic systolic (congestive) heart failure: Secondary | ICD-10-CM | POA: Insufficient documentation

## 2015-09-13 LAB — BASIC METABOLIC PANEL
ANION GAP: 5 (ref 5–15)
BUN: 17 mg/dL (ref 6–20)
CALCIUM: 8.9 mg/dL (ref 8.9–10.3)
CO2: 26 mmol/L (ref 22–32)
Chloride: 107 mmol/L (ref 101–111)
Creatinine, Ser: 1.56 mg/dL — ABNORMAL HIGH (ref 0.61–1.24)
GFR calc non Af Amer: 44 mL/min — ABNORMAL LOW (ref >60)
GFR, EST AFRICAN AMERICAN: 51 mL/min — AB (ref >60)
Glucose, Bld: 173 mg/dL — ABNORMAL HIGH (ref 65–99)
POTASSIUM: 4 mmol/L (ref 3.5–5.1)
Sodium: 138 mmol/L (ref 135–145)

## 2015-09-18 ENCOUNTER — Telehealth (HOSPITAL_COMMUNITY): Payer: Self-pay | Admitting: Pharmacist

## 2015-09-18 MED ORDER — SACUBITRIL-VALSARTAN 24-26 MG PO TABS: 1.0000 | ORAL_TABLET | Freq: Two times a day (BID) | ORAL | 3 refills | Status: DC

## 2015-09-18 MED ORDER — FUROSEMIDE 40 MG PO TABS: 40.0000 mg | ORAL_TABLET | Freq: Every day | ORAL | 3 refills | Status: DC

## 2015-09-18 MED ORDER — CARVEDILOL 6.25 MG PO TABS: 6.2500 mg | ORAL_TABLET | Freq: Two times a day (BID) | ORAL | 3 refills | Status: DC

## 2015-09-18 MED ORDER — CARVEDILOL 3.125 MG PO TABS: 3.1250 mg | ORAL_TABLET | Freq: Two times a day (BID) | ORAL | 3 refills | Status: DC

## 2015-09-18 MED ORDER — SPIRONOLACTONE 25 MG PO TABS: 25.0000 mg | ORAL_TABLET | Freq: Every day | ORAL | 3 refills | Status: DC

## 2015-09-18 MED FILL — ENTRESTO 24 MG-26 MG TABLET: 24-26 | 90 days supply | Qty: 180 | Fill #0

## 2015-09-18 MED FILL — CARVEDILOL 3.125 MG TABLET: 3.125 | 90 days supply | Qty: 180 | Fill #0

## 2015-09-18 NOTE — Telephone Encounter (Signed)
[name] called stating that 1 month supplies of his HF medications do not add up to > $25 which is required for [employer] foundation reimbursement. I have suggested 90 day supplies for his HF medications (carvedilol 3.125 mg, carvedilol 6.25 mg, furosemide 40 mg, Entresto 24-26 mg, and spironolactone 25 mg) and verified that the total will be > $25 at which point he can submit for [employer] foundation reimbursement. He can pick them up from our pharmacy on Friday 8/25. Left VM with this information for him.   Bradley Benitez, PharmD, BCPS, CPP Clinical Pharmacist Pager: [phone] Phone: [phone] 09/18/2015 3:52 PM

## 2015-09-20 ENCOUNTER — Other Ambulatory Visit (HOSPITAL_COMMUNITY): Payer: Self-pay | Admitting: *Deleted

## 2015-09-20 MED ORDER — GABAPENTIN 300 MG PO CAPS: 300.0000 mg | ORAL_CAPSULE | Freq: Every day | ORAL | 3 refills | Status: DC

## 2015-09-20 MED ORDER — ATORVASTATIN CALCIUM 80 MG PO TABS: ORAL_TABLET | ORAL | 3 refills | Status: DC

## 2015-09-20 MED FILL — ATORVASTATIN 80 MG TABLET: 80 | 90 days supply | Qty: 90 | Fill #0

## 2015-09-20 MED FILL — FUROSEMIDE 40 MG TABLET: 40 | 90 days supply | Qty: 90 | Fill #0

## 2015-09-20 MED FILL — GABAPENTIN 300 MG CAPSULE: 300 | 90 days supply | Qty: 90 | Fill #0

## 2015-09-20 MED FILL — SPIRONOLACTONE 25 MG TABLET: 25 | 90 days supply | Qty: 90 | Fill #0

## 2015-09-20 MED FILL — CARVEDILOL 6.25 MG TABLET: 6.25 | 90 days supply | Qty: 180 | Fill #0

## 2015-09-23 ENCOUNTER — Encounter: Payer: Self-pay | Admitting: Internal Medicine

## 2015-10-02 ENCOUNTER — Encounter: Payer: Self-pay | Admitting: Internal Medicine

## 2015-10-04 MED FILL — UNIFINE PENTIPS 31GX3/16: 31G X 5 MM | 25 days supply | Qty: 100 | Fill #3

## 2015-10-04 MED FILL — UNIFINE PENTIPS 31GX3/16": 31G X 5 MM | 25 days supply | Qty: 100 | Fill #3

## 2015-10-07 ENCOUNTER — Ambulatory Visit (INDEPENDENT_AMBULATORY_CARE_PROVIDER_SITE_OTHER): Payer: PPO | Admitting: Internal Medicine

## 2015-10-07 ENCOUNTER — Encounter: Payer: Self-pay | Admitting: Internal Medicine

## 2015-10-07 VITALS — BP 130/64 | HR 60 | Ht 73.0 in | Wt 237.0 lb

## 2015-10-07 DIAGNOSIS — Z95810 Presence of automatic (implantable) cardiac defibrillator: Secondary | ICD-10-CM | POA: Diagnosis not present

## 2015-10-07 DIAGNOSIS — I4901 Ventricular fibrillation: Secondary | ICD-10-CM

## 2015-10-07 DIAGNOSIS — I5022 Chronic systolic (congestive) heart failure: Secondary | ICD-10-CM | POA: Diagnosis not present

## 2015-10-07 NOTE — Progress Notes (Signed)
HPI [name] returns today for ongoing followup, s/p ICD implant. He is a pleasant 65 yo man who sustained an AMI several months ago. He had a stormy course with multiple external defib shocks and bypass surgery. He has persistent LV dysfunction. His EF is 30-35%. He has class 2 CHF symptoms. He is on maximal medical therapy. No syncope. Minimal edema. Since his ICD was placed, he has done well with no chest pain or sob.   Allergies  Allergen Reactions  . Codeine Other (See Comments)    Pt does not remember reaction     Current Outpatient Prescriptions  Medication Sig Dispense Refill  . apixaban (ELIQUIS) 5 MG TABS tablet Take 1 tablet (5 mg total) by mouth 2 (two) times daily. 60 tablet 11  . atorvastatin (LIPITOR) 80 MG tablet TAKE 1 TABLET BY MOUTH DAILY AT 6 PM. 90 tablet 3  . carvedilol (COREG) 3.125 MG tablet Take 1 tablet (3.125 mg total) by mouth 2 (two) times daily. Take along with 6.25 mg tabs to equal 9.375 mg 180 tablet 3  . carvedilol (COREG) 6.25 MG tablet Take 1 tablet (6.25 mg total) by mouth 2 (two) times daily with a meal. Take along with 3.125 mg tabs to equal 9.375 mg 180 tablet 3  . furosemide (LASIX) 40 MG tablet Take 1 tablet (40 mg total) by mouth daily. 90 tablet 3  . gabapentin (NEURONTIN) 300 MG capsule Take 1 capsule (300 mg total) by mouth at bedtime. 90 capsule 3  . insulin aspart (NOVOLOG) 100 UNIT/ML injection Inject 5-10 Units into the skin 3 (three) times daily before meals. Per sliding scale:    . insulin detemir (LEVEMIR) 100 UNIT/ML injection Inject 0.2 mLs (20 Units total) into the skin daily. (Patient taking differently: Inject 20 Units into the skin at bedtime. ) 10 mL 11  . Menthol, Topical Analgesic, (BIOFREEZE EX) Apply 1 application topically at bedtime.    Marland Kitchen OVER THE COUNTER MEDICATION Place 1 drop into both eyes daily as needed (dry eyes). Over the counter lubricating eye drops from [employer]    . sacubitril-valsartan (ENTRESTO) 24-26  MG Take 1 tablet by mouth 2 (two) times daily. 180 tablet 3  . spironolactone (ALDACTONE) 25 MG tablet Take 1 tablet (25 mg total) by mouth daily. 90 tablet 3   No current facility-administered medications for this visit.      Past Medical History:  Diagnosis Date  . DM2 (diabetes mellitus, type 2) (HCC)   . Gangrene (HCC)    10+ years ago    ROS:   All systems reviewed and negative except as noted in the HPI.   Past Surgical History:  Procedure Laterality Date  . CARDIAC CATHETERIZATION N/A 01/25/2015   Procedure: Right/Left Heart Cath and Coronary Angiography;  Surgeon: Tonny Bollman, MD;  Location: [facility] INVASIVE CV LAB;  Service: Cardiovascular;  Laterality: N/A;  . CORONARY ARTERY BYPASS GRAFT N/A 02/01/2015   Procedure: CORONARY ARTERY BYPASS GRAFTING (CABG)x 4 using left internal mammary artery and right greater saphenous leg vein using endoscope.;  Surgeon: [name], MD;  Location: [facility] OR;  Service: Open Heart Surgery;  Laterality: N/A;  . EP IMPLANTABLE DEVICE N/A 07/04/2015   Procedure: ICD Implant;  Surgeon: Marinus Maw, MD;  Location: [facility] INVASIVE CV LAB;  Service: Cardiovascular;  Laterality: N/A;  . KNEE SURGERY Left   . PLACEMENT OF CENTRIMAG VENTRICULAR ASSIST DEVICE N/A 02/01/2015   Procedure:  CENTRIMAG VENTRICULAR ASSIST DEVICE,  back up;  Surgeon: Kerin PernaPeter Van Trigt, MD;  [facility];  Service: Open Heart Surgery;  Laterality: N/A;  . TEE WITHOUT CARDIOVERSION N/A 02/01/2015   Procedure: TRANSESOPHAGEAL ECHOCARDIOGRAM (TEE);  Surgeon: Kerin PernaPeter Van Trigt, MD;  [facility];  Service: Open Heart Surgery;  Laterality: N/A;     Family History  Problem Relation Age of Onset  . Diabetes Mother   . Diabetes Father   . Heart failure Brother      Social History   Social History  . Marital status: Single    Spouse name: N/A  . Number of children: N/A  . Years of education: N/A   Occupational History  . Not on file.   Social History Main Topics  . Smoking  status: Former Smoker    Packs/day: 3.00    Years: 20.00    Types: Cigarettes    Quit date: 01/26/1994  . Smokeless tobacco: Never Used  . Alcohol use No  . Drug use:      Comment: Previously smoked marijuana, none in 7 years  . Sexual activity: Not on file   Other Topics Concern  . Not on file   Social History Narrative  . No narrative on file     BP 130/64   Pulse 60   Ht 6\' 1"  (1.854 m)   Wt 237 lb (107.5 kg)   SpO2 99%   BMI 31.27 kg/m   Physical Exam:  Well appearing but diskempt 65 yo man, NAD HEENT: Unremarkable Neck:  No JVD, no thyromegally Lymphatics:  No adenopathy Back:  No CVA tenderness Lungs:  Clear with no wheezes HEART:  Regular rate rhythm, no murmurs, no rubs, no clicks Abd:  soft, positive bowel sounds, no organomegally, no rebound, no guarding Ext:  2 plus pulses, no edema, no cyanosis, no clubbing Skin:  No rashes no nodules Neuro:  CN II through XII intact, motor grossly intact  EKG - nsr with septal MI  Assess/Plan: 1. Chronic systolic heart failure - his symptoms are well compensated. He will continue his current meds.  2. ICM - he denies anginal symptoms, and is s/p CABG. No change in meds. 3. VT - he had VT/VF at the time of his initial presentation. He has had no recurrent episodes but with his LV function, is at risk for more arrhythmias. 4. ICD - his device is working normally. Will recheck in several months.  Leonia ReevesGregg Taylor,M.D

## 2015-10-07 NOTE — Patient Instructions (Signed)
Medication Instructions:  Your physician recommends that you continue on your current medications as directed. Please refer to the Current Medication list given to you today.   Labwork: None Ordered   Testing/Procedures: None Ordered   Follow-Up: Your physician wants you to follow-up in: 9 months with Dr. Taylor. You will receive a reminder letter in the mail two months in advance. If you don't receive a letter, please call our office to schedule the follow-up appointment.  Remote monitoring is used to monitor your ICD from home. This monitoring reduces the number of office visits required to check your device to one time per year. It allows us to keep an eye on the functioning of your device to ensure it is working properly. You are scheduled for a device check from home on 01/06/16. You may send your transmission at any time that day. If you have a wireless device, the transmission will be sent automatically. After your physician reviews your transmission, you will receive a postcard with your next transmission date.    Any Other Special Instructions Will Be Listed Below (If Applicable).     If you need a refill on your cardiac medications before your next appointment, please call your pharmacy.   

## 2015-10-15 ENCOUNTER — Ambulatory Visit (HOSPITAL_COMMUNITY)
Admission: RE | Admit: 2015-10-15 | Discharge: 2015-10-15 | Disposition: A | Discharge status: Home / Self Care | Payer: PPO | Source: Ambulatory Visit | Attending: Cardiology | Admitting: Cardiology

## 2015-10-15 ENCOUNTER — Encounter (HOSPITAL_COMMUNITY): Payer: Self-pay

## 2015-10-15 VITALS — BP 124/68 | HR 61 | Wt 235.5 lb

## 2015-10-15 DIAGNOSIS — Z951 Presence of aortocoronary bypass graft: Secondary | ICD-10-CM

## 2015-10-15 DIAGNOSIS — E1122 Type 2 diabetes mellitus with diabetic chronic kidney disease: Secondary | ICD-10-CM | POA: Diagnosis not present

## 2015-10-15 DIAGNOSIS — E785 Hyperlipidemia, unspecified: Secondary | ICD-10-CM | POA: Insufficient documentation

## 2015-10-15 DIAGNOSIS — Z79899 Other long term (current) drug therapy: Secondary | ICD-10-CM | POA: Insufficient documentation

## 2015-10-15 DIAGNOSIS — Z7901 Long term (current) use of anticoagulants: Secondary | ICD-10-CM | POA: Insufficient documentation

## 2015-10-15 DIAGNOSIS — N183 Chronic kidney disease, stage 3 (moderate): Secondary | ICD-10-CM | POA: Insufficient documentation

## 2015-10-15 DIAGNOSIS — Z95810 Presence of automatic (implantable) cardiac defibrillator: Secondary | ICD-10-CM | POA: Diagnosis not present

## 2015-10-15 DIAGNOSIS — I213 ST elevation (STEMI) myocardial infarction of unspecified site: Secondary | ICD-10-CM | POA: Diagnosis not present

## 2015-10-15 DIAGNOSIS — Z794 Long term (current) use of insulin: Secondary | ICD-10-CM | POA: Diagnosis not present

## 2015-10-15 DIAGNOSIS — I2510 Atherosclerotic heart disease of native coronary artery without angina pectoris: Secondary | ICD-10-CM | POA: Diagnosis not present

## 2015-10-15 DIAGNOSIS — I255 Ischemic cardiomyopathy: Secondary | ICD-10-CM | POA: Insufficient documentation

## 2015-10-15 DIAGNOSIS — I5022 Chronic systolic (congestive) heart failure: Secondary | ICD-10-CM | POA: Diagnosis not present

## 2015-10-15 DIAGNOSIS — Z87891 Personal history of nicotine dependence: Secondary | ICD-10-CM | POA: Diagnosis not present

## 2015-10-15 DIAGNOSIS — I513 Intracardiac thrombosis, not elsewhere classified: Secondary | ICD-10-CM

## 2015-10-15 DIAGNOSIS — I4901 Ventricular fibrillation: Secondary | ICD-10-CM | POA: Diagnosis not present

## 2015-10-15 MED ORDER — CARVEDILOL 12.5 MG PO TABS: 12.5000 mg | ORAL_TABLET | Freq: Two times a day (BID) | ORAL | 3 refills | Status: DC

## 2015-10-15 NOTE — Patient Instructions (Signed)
Increase Carvedilol to 12.5 mg Twice daily   Please call Cardiac Rehab to schedule, [phone]  Your physician recommends that you schedule a follow-up appointment in: 3 months

## 2015-10-17 ENCOUNTER — Other Ambulatory Visit (HOSPITAL_COMMUNITY): Payer: Self-pay | Admitting: Pharmacist

## 2015-10-17 ENCOUNTER — Telehealth (HOSPITAL_COMMUNITY): Payer: Self-pay | Admitting: Pharmacist

## 2015-10-17 DIAGNOSIS — E785 Hyperlipidemia, unspecified: Secondary | ICD-10-CM | POA: Diagnosis not present

## 2015-10-17 DIAGNOSIS — I10 Essential (primary) hypertension: Secondary | ICD-10-CM | POA: Diagnosis not present

## 2015-10-17 DIAGNOSIS — E119 Type 2 diabetes mellitus without complications: Secondary | ICD-10-CM | POA: Diagnosis not present

## 2015-10-17 LAB — CUP PACEART INCLINIC DEVICE CHECK
Battery Remaining Longevity: 104.4
Date Time Interrogation Session: 20170911182759
HighPow Impedance: 61.875
Implantable Lead Implant Date: 20170608
Implantable Lead Model: 7122
Lead Channel Pacing Threshold Amplitude: 0.75 V
Lead Channel Pacing Threshold Pulse Width: 0.5 ms
Lead Channel Sensing Intrinsic Amplitude: 11.9 mV
MDC IDC LEAD LOCATION: 753860
MDC IDC MSMT LEADCHNL RV IMPEDANCE VALUE: 425 Ohm
MDC IDC PG SERIAL: 7359513
MDC IDC SET LEADCHNL RV PACING AMPLITUDE: 2.5 V
MDC IDC SET LEADCHNL RV PACING PULSEWIDTH: 0.5 ms
MDC IDC SET LEADCHNL RV SENSING SENSITIVITY: 0.5 mV
MDC IDC STAT BRADY RV PERCENT PACED: 0.04 %

## 2015-10-17 MED FILL — CARVEDILOL 12.5 MG TABLET: 12.5 | 60 days supply | Qty: 120 | Fill #0

## 2015-10-17 NOTE — Telephone Encounter (Signed)
Called patient and left VM to call back on my pharmacy line.   Tyler DeisErika K. Bonnye FavaNicolsen, PharmD, BCPS, CPP Clinical Pharmacist Pager: [phone] Phone: [phone] 10/17/2015 11:55 AM

## 2015-10-17 NOTE — Progress Notes (Signed)
Patient ID: Bradley Benitez, male   DOB: 09-11-1950, 65 y.o.   MRN: [id number]   PCP: Dr Selena Batten Cardiologist: Dr Shirlee Latch   HPI: [name] is a 65 year old with a history of DM, smoker, ICM, chronic sysotolic HF, V fib arrest 01/2015 and S/P CABG x4 01/2015.   Admitted to [facility] the end of December with chest pain. LHC showed 3VD and he underwent CABG. Hospital course was complicated by V fib on 02/05/15 required 9 defibrillations. Loaded on amio. He was not placed on bb due to low output. Placed on eliquis for LV thrombus due to concerns about compliance with coumadin. Discharge weight 208 pounds.   Repeat echo in 5/17 with EF remaining 30-35%.  He had Medtronic ICD placed in 6/17.   He returns follow up. He says that breathing is ok, no significant dyspnea.  No chest pain.  No lightheadedness.  No orthopnea/PND. So far, tolerating the current medication regimen. Weight is up 3 lbs.   Labs (1/17): K 4.1, creatinine 1.14 Labs (2/17): LFTs normal, TSH normal Labs (04/01/2015): K 4.5 Creatinine 1.32. Dig level 1.7  Labs (5/17): K 4.5, creatinine 1.6 Labs (6/17): K 4.3, creatinine 1.49, LDL 54, HDL 33 Labs (8/17): K 4, creatinine 1.56, Hgb 12.6  LHC/RHC (12/16): RA mean 11 PA 35/11 PCWP mean 23 CI 2.59   Ost RPDA lesion, 70% stenosed.  1st RPLB lesion, 80% stenosed.  Mid Cx lesion, 80% stenosed.  Prox LAD lesion, 60% stenosed.  Mid LAD lesion, 95% stenosed.  Ost 2nd Diag lesion, 70% stenosed.  Ost Ramus to Ramus lesion, 90% stenosed  Cardiac MRI (1/17): 1. Severe LV systolic dysfunction, EF 26%. Wall motion abnormalities as above. 2. Normal RV size and systolic function . 3. LGE pattern suggestive of infarction in LAD territory. The mid anterior/anteroseptal and apical anterior/septal/inferior wall segments and the true apex do not appear viable (probably not likely to improve function with revascularization). The remainder of the left ventricle appears viable. 4. There is a  small LV apical thrombus.  ECHO (1/17): EF 20-25% with apical thombus.  ECHO (5/17): EF 30-35%, no mention of apical thrombus, moderate MR Peripheral arterial dopplers (6/17): normal study.   ROS: All systems negative except as listed in HPI, PMH and Problem List.  SH:  Social History   Social History  . Marital status: Single    Spouse name: N/A  . Number of children: N/A  . Years of education: N/A   Occupational History  . Not on file.   Social History Main Topics  . Smoking status: Former Smoker    Packs/day: 3.00    Years: 20.00    Types: Cigarettes    Quit date: 01/26/1994  . Smokeless tobacco: Never Used  . Alcohol use No  . Drug use:      Comment: Previously smoked marijuana, none in 7 years  . Sexual activity: Not on file   Other Topics Concern  . Not on file   Social History Narrative  . No narrative on file    FH:  Family History  Problem Relation Age of Onset  . Diabetes Mother   . Diabetes Father   . Heart failure Brother     Past Medical History:  Diagnosis Date  . DM2 (diabetes mellitus, type 2) (HCC)   . Gangrene (HCC)    10+ years ago    Current Outpatient Prescriptions  Medication Sig Dispense Refill  . apixaban (ELIQUIS) 5 MG TABS tablet Take 1 tablet (  5 mg total) by mouth 2 (two) times daily. 60 tablet 11  . atorvastatin (LIPITOR) 80 MG tablet TAKE 1 TABLET BY MOUTH DAILY AT 6 PM. 90 tablet 3  . furosemide (LASIX) 40 MG tablet Take 1 tablet (40 mg total) by mouth daily. 90 tablet 3  . gabapentin (NEURONTIN) 300 MG capsule Take 1 capsule (300 mg total) by mouth at bedtime. 90 capsule 3  . insulin aspart (NOVOLOG) 100 UNIT/ML injection Inject 5-10 Units into the skin 3 (three) times daily before meals. Per sliding scale:    . insulin detemir (LEVEMIR) 100 UNIT/ML injection Inject 0.2 mLs (20 Units total) into the skin daily. (Patient taking differently: Inject 20 Units into the skin at bedtime. ) 10 mL 11  . Menthol, Topical Analgesic,  (BIOFREEZE EX) Apply 1 application topically at bedtime.    Marland Kitchen. OVER THE COUNTER MEDICATION Place 1 drop into both eyes daily as needed (dry eyes). Over the counter lubricating eye drops from [employer]    . sacubitril-valsartan (ENTRESTO) 24-26 MG Take 1 tablet by mouth 2 (two) times daily. 180 tablet 3  . spironolactone (ALDACTONE) 25 MG tablet Take 1 tablet (25 mg total) by mouth daily. 90 tablet 3  . carvedilol (COREG) 12.5 MG tablet Take 1 tablet (12.5 mg total) by mouth 2 (two) times daily. 60 tablet 3   No current facility-administered medications for this encounter.     Vitals:   10/15/15 1451  BP: 124/68  Pulse: 61  SpO2: 100%  Weight: 235 lb 8 oz (106.8 kg)   PHYSICAL EXAM: General:  Well appearing. No resp difficulty. Ambulated in the clinic. Marland Kitchen.  HEENT: normal Neck: supple. JVP 7 cm. Carotids 2+ bilaterally; no bruits. No lymphadenopathy or thryomegaly appreciated. Cor: PMI normal. Regular rate & rhythm. No rubs, gallops or murmurs. Sternal scar.  Unable to palpate pedal pulse. Lungs: clear Abdomen: soft, nontender, nondistended. No hepatosplenomegaly. No bruits or masses. Good bowel sounds. Extremities: no cyanosis, clubbing, rash.  Trace ankle edema.  Neuro: alert & orientedx3, cranial nerves grossly intact. Moves all 4 extremities w/o difficulty. Affect pleasant.  ASSESSMENT & PLAN: 1. 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  - He now has Medtronic ICD and is off amiodarone.  - Continue Coreg.  2. Chronic systolic CHF: EF 16%26% with regional WMAs by cardiac MRI, ischemic cardiomyopathy. S/P CABG x 4. Repeat echo in 5/17 showed that EF remains low, 30-35%.  Now with Medtronic ICD. He does not appear volume overloaded on exam, NYHA class II symptoms.  We have been titrating meds slowly due to tendency to tolerate meds poorly.  - Increase Coreg to 12.5 mg bid.   - Off digoxin due to elevated level  - Continue current Entresto and spironolactone. -  Continue current Lasix.  3. CAD: Diffuse 3 vessel disease, see cath report above. MRI reviewed: LAD territory did not appear to have significant viability. Remainder of LV myocardium did appear viable. He had severe disease in LCx and RCA territory. s/p CABG x 4 in 1/17. Suspect ventricular fibrillation arrest 02/05/15 was scar-mediated as opposed to occlusion of graft, etc.   - No ASA as on apixaban with stable CAD.  - Continue statin.   - Will check on cardiac rehab, he has not heard from them yet.  4. LV mural thrombus: Seen on 1/17 cardiac MRI.  Not reported on most recent echo in 5/17. Continue apixaban 5 mg twice a day. Not thought to be a good candidate for coumadin (concern  about compliance with followup). 5. Hyperlipidemia: Good lipids in 6/17.  6. CKD: Stage III.  Follow creatinine closely.   Followup in 3 months.   [name] 10/17/2015

## 2015-10-17 NOTE — Telephone Encounter (Signed)
-----   Message from Trish FountainKamilah A Stephens sent at 10/16/2015 12:42 PM EDT ----- Pt called would like you to call him

## 2015-10-17 NOTE — Telephone Encounter (Signed)
Spoke with patient about concerns with cost of carvedilol 12.5 mg BID not meeting the >$25 cost for [employer] foundation reimbursement. Will call in Rx for 2 month supply. On or around 11/25, he can get 90 day Rx's for all of his medications and hopefully total they will be >$25 so that he will be reimbursed.   Tyler DeisErika K. Bonnye FavaNicolsen, PharmD, BCPS, CPP Clinical Pharmacist Pager: [phone] Phone: [phone] 10/17/2015 4:35 PM

## 2015-10-22 DIAGNOSIS — E119 Type 2 diabetes mellitus without complications: Secondary | ICD-10-CM | POA: Diagnosis not present

## 2015-10-22 DIAGNOSIS — E785 Hyperlipidemia, unspecified: Secondary | ICD-10-CM | POA: Diagnosis not present

## 2015-10-23 MED FILL — LEVEMIR FLEXTOUCH 100 UNITS: 100 | 90 days supply | Qty: 18 | Fill #0

## 2015-10-23 MED FILL — UNIFINE PENTIPS 31GX3/16": 31G X 5 MM | 90 days supply | Qty: 400 | Fill #0

## 2015-10-23 MED FILL — NOVOLOG FLEXPEN SYRINGE: 100 | 80 days supply | Qty: 12 | Fill #2

## 2015-10-23 MED FILL — UNIFINE PENTIPS 31GX3/16: 31G X 5 MM | 90 days supply | Qty: 400 | Fill #0

## 2015-10-29 ENCOUNTER — Encounter: Payer: Self-pay | Admitting: Internal Medicine

## 2015-10-30 ENCOUNTER — Telehealth (HOSPITAL_COMMUNITY): Payer: Self-pay | Admitting: Surgery

## 2015-10-30 NOTE — Telephone Encounter (Signed)
[facility] has "graduated" from [facility] [employer] [facility] [facility] and will be discharged at this time.  He is stable at home and can teach back HF home recommendations.

## 2015-12-25 MED FILL — SPIRONOLACTONE 25 MG TABLET: 25 | 90 days supply | Qty: 90 | Fill #1

## 2015-12-25 MED FILL — GABAPENTIN 300 MG CAPSULE: 300 | 90 days supply | Qty: 90 | Fill #1

## 2015-12-25 MED FILL — CARVEDILOL 12.5 MG TABLET: 12.5 | 30 days supply | Qty: 60 | Fill #0

## 2015-12-25 MED FILL — ATORVASTATIN 80 MG TABLET: 80 | 90 days supply | Qty: 90 | Fill #1

## 2015-12-25 MED FILL — ENTRESTO 24 MG-26 MG TABLET: 24-26 | 90 days supply | Qty: 180 | Fill #1

## 2015-12-25 MED FILL — FUROSEMIDE 40 MG TABLET: 40 | 90 days supply | Qty: 90 | Fill #1

## 2016-01-06 ENCOUNTER — Telehealth: Payer: Self-pay | Admitting: Cardiology

## 2016-01-06 ENCOUNTER — Ambulatory Visit (INDEPENDENT_AMBULATORY_CARE_PROVIDER_SITE_OTHER): Payer: PPO | Admitting: *Deleted

## 2016-01-06 DIAGNOSIS — I4901 Ventricular fibrillation: Secondary | ICD-10-CM

## 2016-01-06 NOTE — Telephone Encounter (Signed)
LMOVM reminding pt to send remote transmission.   

## 2016-01-07 NOTE — Progress Notes (Signed)
Remote ICD transmission.   

## 2016-01-10 ENCOUNTER — Encounter: Payer: Self-pay | Admitting: Cardiology

## 2016-01-14 ENCOUNTER — Encounter (HOSPITAL_COMMUNITY): Payer: Self-pay

## 2016-01-14 ENCOUNTER — Ambulatory Visit (HOSPITAL_COMMUNITY)
Admission: RE | Admit: 2016-01-14 | Discharge: 2016-01-14 | Disposition: A | Discharge status: Home / Self Care | Payer: PPO | Source: Ambulatory Visit | Attending: Cardiology | Admitting: Cardiology

## 2016-01-14 ENCOUNTER — Encounter: Payer: Self-pay | Admitting: Internal Medicine

## 2016-01-14 VITALS — BP 134/82 | HR 66 | Wt 247.0 lb

## 2016-01-14 DIAGNOSIS — Z794 Long term (current) use of insulin: Secondary | ICD-10-CM | POA: Diagnosis not present

## 2016-01-14 DIAGNOSIS — I2510 Atherosclerotic heart disease of native coronary artery without angina pectoris: Secondary | ICD-10-CM | POA: Diagnosis not present

## 2016-01-14 DIAGNOSIS — Z7901 Long term (current) use of anticoagulants: Secondary | ICD-10-CM | POA: Insufficient documentation

## 2016-01-14 DIAGNOSIS — Z951 Presence of aortocoronary bypass graft: Secondary | ICD-10-CM | POA: Insufficient documentation

## 2016-01-14 DIAGNOSIS — I4901 Ventricular fibrillation: Secondary | ICD-10-CM

## 2016-01-14 DIAGNOSIS — Z9889 Other specified postprocedural states: Secondary | ICD-10-CM | POA: Diagnosis not present

## 2016-01-14 DIAGNOSIS — I110 Hypertensive heart disease with heart failure: Secondary | ICD-10-CM | POA: Diagnosis not present

## 2016-01-14 DIAGNOSIS — E785 Hyperlipidemia, unspecified: Secondary | ICD-10-CM | POA: Insufficient documentation

## 2016-01-14 DIAGNOSIS — Z79899 Other long term (current) drug therapy: Secondary | ICD-10-CM | POA: Insufficient documentation

## 2016-01-14 DIAGNOSIS — E1122 Type 2 diabetes mellitus with diabetic chronic kidney disease: Secondary | ICD-10-CM | POA: Insufficient documentation

## 2016-01-14 DIAGNOSIS — Z95810 Presence of automatic (implantable) cardiac defibrillator: Secondary | ICD-10-CM | POA: Diagnosis not present

## 2016-01-14 DIAGNOSIS — I5022 Chronic systolic (congestive) heart failure: Secondary | ICD-10-CM | POA: Insufficient documentation

## 2016-01-14 DIAGNOSIS — I513 Intracardiac thrombosis, not elsewhere classified: Secondary | ICD-10-CM

## 2016-01-14 DIAGNOSIS — Z87891 Personal history of nicotine dependence: Secondary | ICD-10-CM | POA: Insufficient documentation

## 2016-01-14 DIAGNOSIS — N183 Chronic kidney disease, stage 3 (moderate): Secondary | ICD-10-CM | POA: Diagnosis not present

## 2016-01-14 DIAGNOSIS — I255 Ischemic cardiomyopathy: Secondary | ICD-10-CM | POA: Insufficient documentation

## 2016-01-14 LAB — BASIC METABOLIC PANEL
ANION GAP: 7 (ref 5–15)
BUN: 22 mg/dL — ABNORMAL HIGH (ref 6–20)
CALCIUM: 9.2 mg/dL (ref 8.9–10.3)
CO2: 26 mmol/L (ref 22–32)
Chloride: 106 mmol/L (ref 101–111)
Creatinine, Ser: 1.28 mg/dL — ABNORMAL HIGH (ref 0.61–1.24)
GFR, EST NON AFRICAN AMERICAN: 56 mL/min — AB (ref >60)
Glucose, Bld: 204 mg/dL — ABNORMAL HIGH (ref 65–99)
POTASSIUM: 4.3 mmol/L (ref 3.5–5.1)
Sodium: 139 mmol/L (ref 135–145)

## 2016-01-14 NOTE — Patient Instructions (Signed)
Routine lab work today. Will notify you of abnormal results  Follow up with Dr.McLean in 3 months.  

## 2016-01-14 NOTE — Progress Notes (Signed)
Patient ID: UMER HARIG, male   DOB: 09-15-1950, 65 y.o.   MRN: [id number]   PCP: Dr Selena Batten Cardiologist: Dr Shirlee Latch   HPI: Mr Mayabb is a 66 year old with a history of DM, smoker, ICM, chronic sysotolic HF, V fib arrest 01/2015 and S/P CABG x4 01/2015.   Admitted to [facility] the end of December with chest pain. LHC showed 3VD and he underwent CABG. Hospital course was complicated by V fib on 02/05/15 required 9 defibrillations. Loaded on amio. He was not placed on bb due to low output. Placed on eliquis for LV thrombus due to concerns about compliance with coumadin. Discharge weight 208 pounds.   Repeat echo in 5/17 with EF remaining 30-35%.  He had Medtronic ICD placed in 6/17.   He returns follow up. Last visit losartan was stopped and entresto was started. Overall feeling ok. Denies SOB/PND/orthopnea. No chest pain.  No lightheadedness.  Taking all medications.    ECG: NSR, poor RWP  Labs (1/17): K 4.1, creatinine 1.14 Labs (2/17): LFTs normal, TSH normal Labs (04/01/2015): K 4.5 Creatinine 1.32. Dig level 1.7  Labs (5/17): K 4.5, creatinine 1.6 Labs (6/17): K 4.3, creatinine 1.49, LDL 54, HDL 33 Labs (08/2015): K 4.0 Creatinine 1.56  LHC/RHC (12/16): RA mean 11 PA 35/11 PCWP mean 23 CI 2.59   Ost RPDA lesion, 70% stenosed.  1st RPLB lesion, 80% stenosed.  Mid Cx lesion, 80% stenosed.  Prox LAD lesion, 60% stenosed.  Mid LAD lesion, 95% stenosed.  Ost 2nd Diag lesion, 70% stenosed.  Ost Ramus to Ramus lesion, 90% stenosed  Cardiac MRI (1/17): 1. Severe LV systolic dysfunction, EF 26%. Wall motion abnormalities as above. 2. Normal RV size and systolic function . 3. LGE pattern suggestive of infarction in LAD territory. The mid anterior/anteroseptal and apical anterior/septal/inferior wall segments and the true apex do not appear viable (probably not likely to improve function with revascularization). The remainder of the left ventricle appears viable. 4. There is a  small LV apical thrombus.  ECHO (1/17): EF 20-25% with apical thombus.  ECHO (5/17): EF 30-35%, no mention of apical thrombus, moderate MR Peripheral arterial dopplers (6/17): normal study.   ROS: All systems negative except as listed in HPI, PMH and Problem List.  SH:  Social History   Social History  . Marital status: Single    Spouse name: N/A  . Number of children: N/A  . Years of education: N/A   Occupational History  . Not on file.   Social History Main Topics  . Smoking status: Former Smoker    Packs/day: 3.00    Years: 20.00    Types: Cigarettes    Quit date: 01/26/1994  . Smokeless tobacco: Never Used  . Alcohol use No  . Drug use:      Comment: Previously smoked marijuana, none in 7 years  . Sexual activity: Not on file   Other Topics Concern  . Not on file   Social History Narrative  . No narrative on file    FH:  Family History  Problem Relation Age of Onset  . Diabetes Mother   . Diabetes Father   . Heart failure Brother     Past Medical History:  Diagnosis Date  . DM2 (diabetes mellitus, type 2) (HCC)   . Gangrene (HCC)    10+ years ago    Current Outpatient Prescriptions  Medication Sig Dispense Refill  . apixaban (ELIQUIS) 5 MG TABS tablet Take 1 tablet (5 mg total)  by mouth 2 (two) times daily. 60 tablet 11  . atorvastatin (LIPITOR) 80 MG tablet TAKE 1 TABLET BY MOUTH DAILY AT 6 PM. 90 tablet 3  . carvedilol (COREG) 12.5 MG tablet Take 12.5 mg by mouth 2 (two) times daily with a meal.    . furosemide (LASIX) 40 MG tablet Take 1 tablet (40 mg total) by mouth daily. 90 tablet 3  . gabapentin (NEURONTIN) 300 MG capsule Take 1 capsule (300 mg total) by mouth at bedtime. 90 capsule 3  . insulin aspart (NOVOLOG) 100 UNIT/ML injection Inject 5-10 Units into the skin 3 (three) times daily before meals. Per sliding scale:    . insulin detemir (LEVEMIR) 100 UNIT/ML injection Inject 0.2 mLs (20 Units total) into the skin daily. 10 mL 11  . Menthol,  Topical Analgesic, (BIOFREEZE EX) Apply 1 application topically at bedtime.    Marland Kitchen. OVER THE COUNTER MEDICATION Place 1 drop into both eyes daily as needed (dry eyes). Over the counter lubricating eye drops from [employer]    . sacubitril-valsartan (ENTRESTO) 24-26 MG Take 1 tablet by mouth 2 (two) times daily. 180 tablet 3  . spironolactone (ALDACTONE) 25 MG tablet Take 1 tablet (25 mg total) by mouth daily. 90 tablet 3   No current facility-administered medications for this encounter.     Vitals:   01/14/16 1428  BP: 134/82  Pulse: 66  SpO2: 99%  Weight: 247 lb (112 kg)   PHYSICAL EXAM: General:  Well appearing. No resp difficulty. Ambulated in the clinic. Marland Kitchen.  HEENT: normal Neck: supple. JVP 7 cm. Carotids 2+ bilaterally; no bruits. No lymphadenopathy or thryomegaly appreciated. Cor: PMI normal. Regular rate & rhythm. No rubs, gallops or murmurs. Sternal scar.  Unable to palpate pedal pulse. Lungs: clear Abdomen: soft, nontender, nondistended. No hepatosplenomegaly. No bruits or masses. Good bowel sounds. Extremities: no cyanosis, clubbing, rash.  No edema.  Neuro: alert & orientedx3, cranial nerves grossly intact. Moves all 4 extremities w/o difficulty. Affect pleasant.  ASSESSMENT & PLAN: 1. 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  - He now has Medtronic ICD and is off amiodarone.  - Continue Coreg.  2. Chronic systolic CHF: EF 16%26% with regional WMAs by cardiac MRI, ischemic cardiomyopathy. S/P CABG x 4. NYHA II symptoms.  Repeat echo in 5/17 showed that EF remains low, 30-35%.  Now with Medtronic ICD. He does not appear volume overloaded on exam but Corvue concerning for volume overload. Increase last 40 mg twice a day for 2 days then cut back lasix to 40 mg daily. We have been titrating meds slowly due to tendency to tolerate meds poorly.  - Continue current Coreg.   - Off digoxin due to elevated level  -  Entresto 24/26 bid.  - Continue  spironolactone 25 mg daily. - Check BMET today.  3. CAD: Diffuse 3 vessel disease, see cath report above. MRI reviewed: LAD territory did not appear to have significant viability. Remainder of LV myocardium did appear viable. He had severe disease in LCx and RCA territory. s/p CABG x 4 in 1/17. Suspect ventricular fibrillation arrest 02/05/15 was scar-mediated as opposed to occlusion of graft, etc.   - No ASA as on apixaban with stable CAD.  - Continue statin.   -4. LV mural thrombus: Seen on 1/17 cardiac MRI.  Not reported on most recent echo in 5/17. Continue apixaban 5 mg twice a day. Not thought to be a good candidate for coumadin (concern about compliance with followup).  5. Hyperlipidemia:  Good lipids in 6/17.  6. CKD: Stage III- BMET today.    BMET today.   Follow up in 4 weeks.    Aleigh Grunden NP-C 01/14/2016

## 2016-01-15 ENCOUNTER — Telehealth (HOSPITAL_COMMUNITY): Payer: Self-pay

## 2016-01-15 NOTE — Telephone Encounter (Signed)
Message  Received: Yesterday  Message Contents  Amy Georgie Chard Clegg, NP  Chyrl CivatteMegan G Quanda Pavlicek, RN; Modesta MessingJasmine Q Brown, CMA         Please call him and ask him to double lasix 40 mg twice a day for 2 days then back to lasix 40 mg daily.   He needs follow up in 4 weeks with Shirlee LatchMcLean   tks A    Corvue reviewed by Tonye BecketAmy Clegg NP-C from yesterday's OV. Left VM for patient to call CHF clinic back to receive instructions as stated above regarding this.  Ave FilterBradley, Jassmine Vandruff Genevea, RN

## 2016-01-15 NOTE — Telephone Encounter (Signed)
Message  Received: Yesterday  Message Contents     Amy Georgie Chard Clegg, NP  Chyrl CivatteMegan G Surafel Hilleary, RN; Modesta MessingJasmine Q Brown, CMA          Please call him and ask him to double lasix 40 mg twice a day for 2 days then back to lasix 40 mg daily.   He needs follow up in 4 weeks with Shirlee LatchMcLean   tks A     Corvue reviewed by Tonye BecketAmy Clegg NP-C from yesterday's OV. Left VM for patient to call CHF clinic back to receive instructions as stated above regarding this.      2nd attempt to reach patient. LVMTCB regarding above.

## 2016-01-15 NOTE — Telephone Encounter (Signed)
Pt aware voiced understanding. [facility] !@ 1120. Patient advised if weight has not come down can return call on 12/26 for further advsie

## 2016-01-21 MED FILL — LEVEMIR FLEXTOUCH 100 UNITS: 100 | 90 days supply | Qty: 18 | Fill #1

## 2016-01-21 MED FILL — NOVOLOG FLEXPEN SYRINGE: 100 | 80 days supply | Qty: 12 | Fill #3

## 2016-01-21 MED FILL — UNIFINE PENTIPS 31GX3/16: 31G X 5 MM | 90 days supply | Qty: 400 | Fill #1

## 2016-01-21 MED FILL — UNIFINE PENTIPS 31GX3/16": 31G X 5 MM | 90 days supply | Qty: 400 | Fill #1

## 2016-01-30 LAB — CUP PACEART REMOTE DEVICE CHECK
Battery Remaining Longevity: 98 mo
Battery Remaining Percentage: 93 %
Brady Statistic RV Percent Paced: 1 %
Date Time Interrogation Session: 20171212062322
HIGH POWER IMPEDANCE MEASURED VALUE: 66 Ohm
HIGH POWER IMPEDANCE MEASURED VALUE: 66 Ohm
Implantable Lead Implant Date: 20170608
Implantable Lead Model: 7122
Implantable Pulse Generator Implant Date: 20170608
Lead Channel Impedance Value: 410 Ohm
Lead Channel Pacing Threshold Amplitude: 0.75 V
Lead Channel Pacing Threshold Pulse Width: 0.5 ms
MDC IDC LEAD LOCATION: 753860
MDC IDC MSMT BATTERY VOLTAGE: 3.2 V
MDC IDC MSMT LEADCHNL RV SENSING INTR AMPL: 11.9 mV
MDC IDC PG SERIAL: 7359513
MDC IDC SET LEADCHNL RV PACING AMPLITUDE: 2.5 V
MDC IDC SET LEADCHNL RV PACING PULSEWIDTH: 0.5 ms
MDC IDC SET LEADCHNL RV SENSING SENSITIVITY: 0.5 mV

## 2016-02-11 ENCOUNTER — Encounter (HOSPITAL_COMMUNITY): Payer: Self-pay

## 2016-02-11 ENCOUNTER — Ambulatory Visit (HOSPITAL_COMMUNITY)
Admission: RE | Admit: 2016-02-11 | Discharge: 2016-02-11 | Disposition: A | Discharge status: Home / Self Care | Payer: PPO | Source: Ambulatory Visit | Attending: Cardiology | Admitting: Cardiology

## 2016-02-11 VITALS — BP 124/64 | HR 58 | Wt 249.8 lb

## 2016-02-11 DIAGNOSIS — Z7901 Long term (current) use of anticoagulants: Secondary | ICD-10-CM | POA: Insufficient documentation

## 2016-02-11 DIAGNOSIS — Z87891 Personal history of nicotine dependence: Secondary | ICD-10-CM | POA: Insufficient documentation

## 2016-02-11 DIAGNOSIS — E1122 Type 2 diabetes mellitus with diabetic chronic kidney disease: Secondary | ICD-10-CM | POA: Diagnosis not present

## 2016-02-11 DIAGNOSIS — N183 Chronic kidney disease, stage 3 (moderate): Secondary | ICD-10-CM | POA: Insufficient documentation

## 2016-02-11 DIAGNOSIS — Z951 Presence of aortocoronary bypass graft: Secondary | ICD-10-CM | POA: Diagnosis not present

## 2016-02-11 DIAGNOSIS — I4901 Ventricular fibrillation: Secondary | ICD-10-CM

## 2016-02-11 DIAGNOSIS — E785 Hyperlipidemia, unspecified: Secondary | ICD-10-CM | POA: Insufficient documentation

## 2016-02-11 DIAGNOSIS — Z794 Long term (current) use of insulin: Secondary | ICD-10-CM | POA: Insufficient documentation

## 2016-02-11 DIAGNOSIS — I5022 Chronic systolic (congestive) heart failure: Secondary | ICD-10-CM | POA: Diagnosis not present

## 2016-02-11 DIAGNOSIS — Z79899 Other long term (current) drug therapy: Secondary | ICD-10-CM | POA: Diagnosis not present

## 2016-02-11 DIAGNOSIS — I255 Ischemic cardiomyopathy: Secondary | ICD-10-CM | POA: Insufficient documentation

## 2016-02-11 DIAGNOSIS — Z9889 Other specified postprocedural states: Secondary | ICD-10-CM | POA: Insufficient documentation

## 2016-02-11 DIAGNOSIS — I2510 Atherosclerotic heart disease of native coronary artery without angina pectoris: Secondary | ICD-10-CM | POA: Diagnosis not present

## 2016-02-11 DIAGNOSIS — Z95810 Presence of automatic (implantable) cardiac defibrillator: Secondary | ICD-10-CM | POA: Insufficient documentation

## 2016-02-11 LAB — BASIC METABOLIC PANEL
Anion gap: 8 (ref 5–15)
BUN: 17 mg/dL (ref 6–20)
CHLORIDE: 108 mmol/L (ref 101–111)
CO2: 25 mmol/L (ref 22–32)
CREATININE: 1.18 mg/dL (ref 0.61–1.24)
Calcium: 9.1 mg/dL (ref 8.9–10.3)
GFR calc Af Amer: >60 mL/min (ref >60)
GFR calc non Af Amer: >60 mL/min (ref >60)
Glucose, Bld: 150 mg/dL — ABNORMAL HIGH (ref 65–99)
Potassium: 4.3 mmol/L (ref 3.5–5.1)
SODIUM: 141 mmol/L (ref 135–145)

## 2016-02-11 LAB — BRAIN NATRIURETIC PEPTIDE: B Natriuretic Peptide: 197.5 pg/mL — ABNORMAL HIGH (ref 0.0–100.0)

## 2016-02-11 MED ORDER — FUROSEMIDE 40 MG PO TABS: ORAL_TABLET | ORAL | 3 refills | Status: DC

## 2016-02-11 MED ORDER — SACUBITRIL-VALSARTAN 49-51 MG PO TABS: 1.0000 | ORAL_TABLET | Freq: Two times a day (BID) | ORAL | 3 refills | Status: DC

## 2016-02-11 MED FILL — ENTRESTO 49 MG-51 MG TABLET: 49-51 | 30 days supply | Qty: 60 | Fill #0

## 2016-02-11 NOTE — Patient Instructions (Signed)
Increase Furosemide to 40 mg (1 tab) in AM and 20 mg (1/2 tab) in PM  Increase Entresto to 49/51 mg Twice daily   Labs today  Labs in 10 days  Your physician recommends that you schedule a follow-up appointment in: 1 month

## 2016-02-12 NOTE — Progress Notes (Signed)
Patient ID: Bradley Benitez, male   DOB: 29-Dec-1950, 66 y.o.   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   Admitted to [facility] the end of December with chest pain. LHC showed 3VD and he underwent CABG. Hospital course was complicated by V fib on 02/05/15 required 9 defibrillations. Loaded on amio. He was not placed on bb due to low output. Placed on eliquis for LV thrombus due to concerns about compliance with coumadin. Discharge weight 208 pounds.   Repeat echo in 5/17 with EF remaining 30-35%.  He had Medtronic ICD placed in 6/17.   He returns follow up.  Still with ankle edema.  Dyspnea walking 50-100 yards, stable.  No chest pain.  No orthopnea/PND.  No BRPBR/melena.  Weight is up 2 lbs.   Labs (1/17): K 4.1, creatinine 1.14 Labs (2/17): LFTs normal, TSH normal Labs (04/01/2015): K 4.5 Creatinine 1.32. Dig level 1.7  Labs (5/17): K 4.5, creatinine 1.6 Labs (6/17): K 4.3, creatinine 1.49, LDL 54, HDL 33 Labs (08/2015): K 4.0 Creatinine 1.56 Labs (12/17): K 4.3, creatinine 1.28  LHC/RHC (12/16): RA mean 11 PA 35/11 PCWP mean 23 CI 2.59   Ost RPDA lesion, 70% stenosed.  1st RPLB lesion, 80% stenosed.  Mid Cx lesion, 80% stenosed.  Prox LAD lesion, 60% stenosed.  Mid LAD lesion, 95% stenosed.  Ost 2nd Diag lesion, 70% stenosed.  Ost Ramus to Ramus lesion, 90% stenosed  Cardiac MRI (1/17): 1. Severe LV systolic dysfunction, EF 26%. Wall motion abnormalities as above. 2. Normal RV size and systolic function . 3. LGE pattern suggestive of infarction in LAD territory. The mid anterior/anteroseptal and apical anterior/septal/inferior wall segments and the true apex do not appear viable (probably not likely to improve function with revascularization). The remainder of the left ventricle appears viable. 4. There is a small LV  apical thrombus.  ECHO (1/17): EF 20-25% with apical thombus.  ECHO (5/17): EF 30-35%, no mention of apical thrombus, moderate MR Peripheral arterial dopplers (6/17): normal study.   ROS: All systems negative except as listed in HPI, PMH and Problem List.  SH:  Social History   Social History  . Marital status: Single    Spouse name: N/A  . Number of children: N/A  . Years of education: N/A   Occupational History  . Not on file.   Social History Main Topics  . Smoking status: Former Smoker    Packs/day: 3.00    Years: 20.00    Types: Cigarettes    Quit date: 01/26/1994  . Smokeless tobacco: Never Used  . Alcohol use No  . Drug use:      Comment: Previously smoked marijuana, none in 7 years  . Sexual activity: Not on file   Other Topics Concern  . Not on file   Social History Narrative  . No narrative on file    FH:  Family History  Problem Relation Age of Onset  . Diabetes Mother   . Diabetes Father   . Heart failure Brother     Past Medical History:  Diagnosis Date  . DM2 (diabetes mellitus, type 2) (HCC)   . Gangrene (HCC)    10+ years ago    Current Outpatient Prescriptions  Medication Sig Dispense Refill  . apixaban (ELIQUIS) 5 MG TABS tablet Take 1 tablet (  5 mg total) by mouth 2 (two) times daily. 60 tablet 11  . atorvastatin (LIPITOR) 80 MG tablet TAKE 1 TABLET BY MOUTH DAILY AT 6 PM. 90 tablet 3  . carvedilol (COREG) 12.5 MG tablet Take 12.5 mg by mouth 2 (two) times daily with a meal.    . furosemide (LASIX) 40 MG tablet Take 1 tab in AM and 1/2 tab in PM 90 tablet 3  . gabapentin (NEURONTIN) 300 MG capsule Take 1 capsule (300 mg total) by mouth at bedtime. 90 capsule 3  . insulin aspart (NOVOLOG) 100 UNIT/ML injection Inject 5-10 Units into the skin 3 (three) times daily before meals. Per sliding scale:    . insulin detemir (LEVEMIR) 100 UNIT/ML injection Inject 0.2 mLs (20 Units total) into the skin daily. 10 mL 11  . Menthol, Topical Analgesic,  (BIOFREEZE EX) Apply 1 application topically at bedtime.    Marland Kitchen OVER THE COUNTER MEDICATION Place 1 drop into both eyes daily as needed (dry eyes). Over the counter lubricating eye drops from [employer]    . spironolactone (ALDACTONE) 25 MG tablet Take 1 tablet (25 mg total) by mouth daily. 90 tablet 3  . sacubitril-valsartan (ENTRESTO) 49-51 MG Take 1 tablet by mouth 2 (two) times daily. 60 tablet 3   No current facility-administered medications for this encounter.     Vitals:   02/11/16 1143  BP: 124/64  Pulse: (!) 58  SpO2: 100%  Weight: 249 lb 12 oz (113.3 kg)   PHYSICAL EXAM: General:  Well appearing. No resp difficulty. Ambulated in the clinic. Marland Kitchen  HEENT: normal Neck: supple. JVP 8-9 cm. Carotids 2+ bilaterally; no bruits. No lymphadenopathy or thryomegaly appreciated. Cor: PMI normal. Regular rate & rhythm. No rubs, gallops or murmurs. Sternal scar.  Unable to palpate pedal pulse. Lungs: clear Abdomen: soft, nontender, nondistended. No hepatosplenomegaly. No bruits or masses. Good bowel sounds. Extremities: no cyanosis, clubbing, rash.  2+ ankle edema.  Neuro: alert & orientedx3, cranial nerves grossly intact. Moves all 4 extremities w/o difficulty. Affect pleasant.  ASSESSMENT & PLAN: 1. 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  - He now has Medtronic ICD and is off amiodarone.  - Continue Coreg.  2. Chronic systolic CHF: EF 40% with regional WMAs by cardiac MRI, ischemic cardiomyopathy. S/P CABG x 4. NYHA II symptoms.  Repeat echo in 5/17 showed that EF remains low, 30-35%.  Now with Medtronic ICD. He is volume overloaded on exam today, NYHA class III symptoms.  - Continue current Coreg.   - Off digoxin due to elevated level  - Increase Entresto to 49/51 bid.  BMET today and in 2 wks.   - Continue spironolactone 25 mg daily. - Increase Lasix to 40 qam/20 qpm.    3. CAD: Diffuse 3 vessel disease, see cath report above. MRI reviewed: LAD territory  did not appear to have significant viability. Remainder of LV myocardium did appear viable. He had severe disease in LCx and RCA territory. s/p CABG x 4 in 1/17. Suspect ventricular fibrillation arrest 02/05/15 was scar-mediated as opposed to occlusion of graft, etc.  No chest pain.  - No ASA as on apixaban with stable CAD.  - Continue statin.   4. LV mural thrombus: Seen on 1/17 cardiac MRI.  Not reported on most recent echo in 5/17. Continue apixaban 5 mg twice a day. Not thought to be a good candidate for coumadin (concern about compliance with followup). 5. Hyperlipidemia: Good lipids in 6/17.  6. CKD: Stage III. BMET  today.    Followup in 1 month.   Marca AnconaDalton Catalaya Garr 02/12/2016

## 2016-02-14 ENCOUNTER — Other Ambulatory Visit (HOSPITAL_COMMUNITY): Payer: Self-pay | Admitting: Pharmacist

## 2016-02-14 MED ORDER — FUROSEMIDE 40 MG PO TABS: ORAL_TABLET | ORAL | 3 refills | Status: DC

## 2016-02-17 ENCOUNTER — Other Ambulatory Visit (HOSPITAL_COMMUNITY): Payer: Self-pay | Admitting: Pharmacist

## 2016-02-17 MED ORDER — FUROSEMIDE 20 MG PO TABS: 20.0000 mg | ORAL_TABLET | Freq: Two times a day (BID) | ORAL | 5 refills | Status: DC

## 2016-02-17 MED FILL — FUROSEMIDE 20 MG TABLET: 20 | 30 days supply | Qty: 90 | Fill #0

## 2016-02-20 ENCOUNTER — Other Ambulatory Visit (HOSPITAL_COMMUNITY): Payer: PPO

## 2016-02-21 ENCOUNTER — Other Ambulatory Visit (HOSPITAL_COMMUNITY): Payer: PPO

## 2016-03-02 ENCOUNTER — Other Ambulatory Visit (HOSPITAL_COMMUNITY): Payer: Self-pay | Admitting: *Deleted

## 2016-03-02 ENCOUNTER — Ambulatory Visit (HOSPITAL_COMMUNITY)
Admission: RE | Admit: 2016-03-02 | Discharge: 2016-03-02 | Disposition: A | Discharge status: Home / Self Care | Payer: PPO | Source: Ambulatory Visit | Attending: Cardiology | Admitting: Cardiology

## 2016-03-02 DIAGNOSIS — I255 Ischemic cardiomyopathy: Secondary | ICD-10-CM | POA: Insufficient documentation

## 2016-03-02 DIAGNOSIS — I5022 Chronic systolic (congestive) heart failure: Secondary | ICD-10-CM

## 2016-03-02 LAB — BASIC METABOLIC PANEL
Anion gap: 9 (ref 5–15)
BUN: 16 mg/dL (ref 6–20)
CALCIUM: 9.1 mg/dL (ref 8.9–10.3)
CO2: 26 mmol/L (ref 22–32)
Chloride: 104 mmol/L (ref 101–111)
Creatinine, Ser: 1.37 mg/dL — ABNORMAL HIGH (ref 0.61–1.24)
GFR calc Af Amer: >60 mL/min (ref >60)
GFR calc non Af Amer: 52 mL/min — ABNORMAL LOW (ref >60)
GLUCOSE: 255 mg/dL — AB (ref 65–99)
Potassium: 4.4 mmol/L (ref 3.5–5.1)
Sodium: 139 mmol/L (ref 135–145)

## 2016-03-02 LAB — BRAIN NATRIURETIC PEPTIDE: B Natriuretic Peptide: 194.7 pg/mL — ABNORMAL HIGH (ref 0.0–100.0)

## 2016-03-04 ENCOUNTER — Other Ambulatory Visit: Payer: Self-pay | Admitting: Internal Medicine

## 2016-03-16 ENCOUNTER — Encounter (HOSPITAL_COMMUNITY): Payer: PPO

## 2016-03-17 ENCOUNTER — Other Ambulatory Visit (HOSPITAL_COMMUNITY): Payer: Self-pay | Admitting: Pharmacist

## 2016-03-17 MED ORDER — CARVEDILOL 12.5 MG PO TABS: 12.5000 mg | ORAL_TABLET | Freq: Two times a day (BID) | ORAL | 3 refills | Status: DC

## 2016-03-17 MED ORDER — GABAPENTIN 300 MG PO CAPS: 300.0000 mg | ORAL_CAPSULE | Freq: Every day | ORAL | 3 refills | Status: DC

## 2016-03-17 MED ORDER — SPIRONOLACTONE 25 MG PO TABS: 25.0000 mg | ORAL_TABLET | Freq: Every day | ORAL | 3 refills | Status: DC

## 2016-03-17 MED ORDER — APIXABAN 5 MG PO TABS: 5.0000 mg | ORAL_TABLET | Freq: Two times a day (BID) | ORAL | 3 refills | Status: DC

## 2016-03-17 MED ORDER — FUROSEMIDE 20 MG PO TABS: 20.0000 mg | ORAL_TABLET | Freq: Two times a day (BID) | ORAL | 3 refills | Status: DC

## 2016-03-17 MED ORDER — ATORVASTATIN CALCIUM 80 MG PO TABS: 80.0000 mg | ORAL_TABLET | Freq: Every day | ORAL | 3 refills | Status: DC

## 2016-03-17 MED ORDER — SACUBITRIL-VALSARTAN 49-51 MG PO TABS: 1.0000 | ORAL_TABLET | Freq: Two times a day (BID) | ORAL | 3 refills | Status: DC

## 2016-03-17 MED FILL — ELIQUIS 5 MG TABLET: 5 | 90 days supply | Qty: 180 | Fill #0

## 2016-03-17 MED FILL — FUROSEMIDE 20 MG TABLET: 20 | 90 days supply | Qty: 270 | Fill #0

## 2016-03-17 MED FILL — ATORVASTATIN 80 MG TABLET: 80 | 90 days supply | Qty: 90 | Fill #0

## 2016-03-17 MED FILL — ENTRESTO 49 MG-51 MG TABLET: 49-51 | 90 days supply | Qty: 180 | Fill #0

## 2016-03-17 MED FILL — SPIRONOLACTONE 25 MG TABLET: 25 | 90 days supply | Qty: 90 | Fill #0

## 2016-03-17 MED FILL — CARVEDILOL 12.5 MG TABLET: 12.5 | 90 days supply | Qty: 180 | Fill #0

## 2016-03-17 NOTE — Telephone Encounter (Signed)
[name] called stating he needed 90 day supplies of his furosemide 20 mg, Entresto 49-51 mg and Eliquis 5 mg. I have sent these in each with 3 refills. Also per his request, I called the pharmacy and verified that he had 90 day refills on all of his other medications.   Tyler DeisErika K. Bonnye FavaNicolsen, PharmD, BCPS, CPP Clinical Pharmacist Pager: [phone] Phone: [phone] 03/17/2016 4:57 PM

## 2016-03-20 ENCOUNTER — Ambulatory Visit (HOSPITAL_COMMUNITY)
Admission: RE | Admit: 2016-03-20 | Discharge: 2016-03-20 | Disposition: A | Discharge status: Home / Self Care | Payer: PPO | Source: Ambulatory Visit | Attending: Cardiology | Admitting: Cardiology

## 2016-03-20 ENCOUNTER — Encounter (HOSPITAL_COMMUNITY): Payer: Self-pay

## 2016-03-20 VITALS — BP 126/78 | HR 78 | Wt 252.0 lb

## 2016-03-20 DIAGNOSIS — Z95810 Presence of automatic (implantable) cardiac defibrillator: Secondary | ICD-10-CM | POA: Insufficient documentation

## 2016-03-20 DIAGNOSIS — G8929 Other chronic pain: Secondary | ICD-10-CM

## 2016-03-20 DIAGNOSIS — N183 Chronic kidney disease, stage 3 (moderate): Secondary | ICD-10-CM | POA: Insufficient documentation

## 2016-03-20 DIAGNOSIS — Z951 Presence of aortocoronary bypass graft: Secondary | ICD-10-CM

## 2016-03-20 DIAGNOSIS — Z87891 Personal history of nicotine dependence: Secondary | ICD-10-CM | POA: Diagnosis not present

## 2016-03-20 DIAGNOSIS — I4901 Ventricular fibrillation: Secondary | ICD-10-CM | POA: Diagnosis not present

## 2016-03-20 DIAGNOSIS — E1122 Type 2 diabetes mellitus with diabetic chronic kidney disease: Secondary | ICD-10-CM | POA: Insufficient documentation

## 2016-03-20 DIAGNOSIS — I5022 Chronic systolic (congestive) heart failure: Secondary | ICD-10-CM | POA: Diagnosis not present

## 2016-03-20 DIAGNOSIS — Z9889 Other specified postprocedural states: Secondary | ICD-10-CM | POA: Diagnosis not present

## 2016-03-20 DIAGNOSIS — Z79899 Other long term (current) drug therapy: Secondary | ICD-10-CM | POA: Diagnosis not present

## 2016-03-20 DIAGNOSIS — Z794 Long term (current) use of insulin: Secondary | ICD-10-CM | POA: Insufficient documentation

## 2016-03-20 DIAGNOSIS — Z7901 Long term (current) use of anticoagulants: Secondary | ICD-10-CM | POA: Insufficient documentation

## 2016-03-20 DIAGNOSIS — M25562 Pain in left knee: Secondary | ICD-10-CM | POA: Insufficient documentation

## 2016-03-20 DIAGNOSIS — I513 Intracardiac thrombosis, not elsewhere classified: Secondary | ICD-10-CM | POA: Diagnosis not present

## 2016-03-20 DIAGNOSIS — I255 Ischemic cardiomyopathy: Secondary | ICD-10-CM | POA: Diagnosis not present

## 2016-03-20 DIAGNOSIS — E785 Hyperlipidemia, unspecified: Secondary | ICD-10-CM | POA: Insufficient documentation

## 2016-03-20 DIAGNOSIS — I2510 Atherosclerotic heart disease of native coronary artery without angina pectoris: Secondary | ICD-10-CM | POA: Insufficient documentation

## 2016-03-20 NOTE — Patient Instructions (Signed)
Keep appointment as scheduled March 19th

## 2016-03-22 NOTE — Progress Notes (Signed)
Patient ID: Oliver PilaDavid M Scheid, male   DOB: 02-Apr-1950, 66 y.o.   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   Admitted to [facility] the end of December with chest pain. LHC showed 3VD and he underwent CABG. Hospital course was complicated by V fib on 02/05/15 required 9 defibrillations. Loaded on amio. He was not placed on bb due to low output. Placed on eliquis for LV thrombus due to concerns about compliance with coumadin. Discharge weight 208 pounds.   Repeat echo in 5/17 with EF remaining 30-35%.  He had St Jude ICD placed in 6/17.   He returns follow up.  Still with ankle edema.  Dyspnea walking 100 yards, stable.  No chest pain.  No orthopnea/PND.  No BRPBR/melena.  Weight is up 3 lbs.  He occasionally gets lightheaded with standing but has not fallen or passed out. He has a painful left knee, there appears to be an effusion present.  He has had to walk with a cane because of this for 2 wks.   Labs (1/17): K 4.1, creatinine 1.14 Labs (2/17): LFTs normal, TSH normal Labs (04/01/2015): K 4.5 Creatinine 1.32. Dig level 1.7  Labs (5/17): K 4.5, creatinine 1.6 Labs (6/17): K 4.3, creatinine 1.49, LDL 54, HDL 33 Labs (08/2015): K 4.0 Creatinine 1.56 Labs (12/17): K 4.3, creatinine 1.28 Labs (2/18): K 4.4, creatinine 1.37, BNP 195  Corevue: No evidence today for volume overload.   LHC/RHC (12/16): RA mean 11 PA 35/11 PCWP mean 23 CI 2.59   Ost RPDA lesion, 70% stenosed.  1st RPLB lesion, 80% stenosed.  Mid Cx lesion, 80% stenosed.  Prox LAD lesion, 60% stenosed.  Mid LAD lesion, 95% stenosed.  Ost 2nd Diag lesion, 70% stenosed.  Ost Ramus to Ramus lesion, 90% stenosed  Cardiac MRI (1/17): 1. Severe LV systolic dysfunction, EF 26%. Wall motion abnormalities as above. 2. Normal RV size and systolic function . 3. LGE pattern suggestive  of infarction in LAD territory. The mid anterior/anteroseptal and apical anterior/septal/inferior wall segments and the true apex do not appear viable (probably not likely to improve function with revascularization). The remainder of the left ventricle appears viable. 4. There is a small LV apical thrombus.  ECHO (1/17): EF 20-25% with apical thombus.  ECHO (5/17): EF 30-35%, no mention of apical thrombus, moderate MR Peripheral arterial dopplers (6/17): normal study.   ROS: All systems negative except as listed in HPI, PMH and Problem List.  SH:  Social History   Social History  . Marital status: Single    Spouse name: N/A  . Number of children: N/A  . Years of education: N/A   Occupational History  . Not on file.   Social History Main Topics  . Smoking status: Former Smoker    Packs/day: 3.00    Years: 20.00    Types: Cigarettes    Quit date: 01/26/1994  . Smokeless tobacco: Never Used  . Alcohol use No  . Drug use: Yes     Comment: Previously smoked marijuana, none in 7 years  . Sexual activity: Not on file   Other Topics Concern  . Not on file   Social History Narrative  . No narrative on file    FH:  Family History  Problem Relation Age of Onset  . Diabetes Mother   .  Diabetes Father   . Heart failure Brother     Past Medical History:  Diagnosis Date  . DM2 (diabetes mellitus, type 2) (HCC)   . Gangrene (HCC)    10+ years ago    Current Outpatient Prescriptions  Medication Sig Dispense Refill  . apixaban (ELIQUIS) 5 MG TABS tablet Take 1 tablet (5 mg total) by mouth 2 (two) times daily. 180 tablet 3  . atorvastatin (LIPITOR) 80 MG tablet Take 1 tablet (80 mg total) by mouth daily. 90 tablet 3  . carvedilol (COREG) 12.5 MG tablet Take 1 tablet (12.5 mg total) by mouth 2 (two) times daily with a meal. 180 tablet 3  . furosemide (LASIX) 20 MG tablet Take 1-2 tablets (20-40 mg total) by mouth 2 (two) times daily. Take 40 mg (2 tablets) in the morning  and 20 mg (1 tablet) in the afternoon 270 tablet 3  . gabapentin (NEURONTIN) 300 MG capsule Take 1 capsule (300 mg total) by mouth at bedtime. 90 capsule 3  . insulin aspart (NOVOLOG) 100 UNIT/ML injection Inject 5-10 Units into the skin 3 (three) times daily before meals. Per sliding scale:    . insulin detemir (LEVEMIR) 100 UNIT/ML injection Inject 0.2 mLs (20 Units total) into the skin daily. 10 mL 11  . Menthol, Topical Analgesic, (BIOFREEZE EX) Apply 1 application topically at bedtime.    Marland Kitchen OVER THE COUNTER MEDICATION Place 1 drop into both eyes daily as needed (dry eyes). Over the counter lubricating eye drops from [employer]    . sacubitril-valsartan (ENTRESTO) 49-51 MG Take 1 tablet by mouth 2 (two) times daily. 180 tablet 3  . spironolactone (ALDACTONE) 25 MG tablet Take 1 tablet (25 mg total) by mouth daily. 90 tablet 3   No current facility-administered medications for this encounter.     Vitals:   03/20/16 1511  BP: 126/78  Pulse: 78  SpO2: 98%  Weight: 252 lb (114.3 kg)   PHYSICAL EXAM: General:  Well appearing. No resp difficulty. Ambulated in the clinic. Marland Kitchen  HEENT: normal Neck: supple. JVP 7 cm. Carotids 2+ bilaterally; no bruits. No lymphadenopathy or thryomegaly appreciated. Cor: PMI normal. Regular rate & rhythm. No rubs, gallops or murmurs. Sternal scar.  Unable to palpate pedal pulse. Lungs: clear Abdomen: soft, nontender, nondistended. No hepatosplenomegaly. No bruits or masses. Good bowel sounds. Extremities: no cyanosis, clubbing, rash.  1+ ankle edema.  Neuro: alert & orientedx3, cranial nerves grossly intact. Moves all 4 extremities w/o difficulty. Affect pleasant.  ASSESSMENT & PLAN: 1. 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  - He now has St Jude ICD and is off amiodarone.  - Continue Coreg.  2. Chronic systolic CHF: EF 81% with regional WMAs by cardiac MRI, ischemic cardiomyopathy. S/P CABG x 4. NYHA II symptoms.  Repeat echo  in 5/17 showed that EF remains low, 30-35%.  Now with Medtronic ICD. He is not volume overloaded by exam or Corevue. - Continue current Coreg.   - Off digoxin due to elevated level  - Continue Entresto 49/51 bid. I will not increase dose today due to occasional orthostatic symptoms.   - Continue spironolactone 25 mg daily. - Continue Lasix 40 qam/20 qpm.    3. CAD: Diffuse 3 vessel disease, see cath report above. MRI reviewed: LAD territory did not appear to have significant viability. Remainder of LV myocardium did appear viable. He had severe disease in LCx and RCA territory. s/p CABG x 4 in 1/17. Suspect ventricular fibrillation arrest 02/05/15 was scar-mediated  as opposed to occlusion of graft, etc.  No chest pain.  - No ASA as on apixaban with stable CAD.  - Continue statin.   4. LV mural thrombus: Seen on 1/17 cardiac MRI.  Not reported on most recent echo in 5/17. Continue apixaban 5 mg twice a day. Not thought to be a good candidate for coumadin (concern about compliance with followup). 5. Hyperlipidemia: Good lipids in 6/17.  6. CKD: Stage III.  7. Left knee arthralgias/effusion: Significant limitation to his activity.  I will arrange for orthopedics evaluation.   Followup in 2 months.   [name] 03/22/2016

## 2016-03-27 MED FILL — GABAPENTIN 300 MG CAPSULE: 300 | 90 days supply | Qty: 90 | Fill #2

## 2016-04-06 ENCOUNTER — Ambulatory Visit (INDEPENDENT_AMBULATORY_CARE_PROVIDER_SITE_OTHER): Payer: PPO | Admitting: *Deleted

## 2016-04-06 ENCOUNTER — Telehealth: Payer: Self-pay | Admitting: Cardiology

## 2016-04-06 DIAGNOSIS — I4901 Ventricular fibrillation: Secondary | ICD-10-CM | POA: Diagnosis not present

## 2016-04-06 NOTE — Telephone Encounter (Signed)
LMOVM reminding pt to send remote transmission.   

## 2016-04-08 ENCOUNTER — Encounter: Payer: Self-pay | Admitting: Cardiology

## 2016-04-08 ENCOUNTER — Other Ambulatory Visit: Payer: Self-pay | Admitting: Internal Medicine

## 2016-04-08 LAB — CUP PACEART REMOTE DEVICE CHECK
Battery Voltage: 3.16 V
Date Time Interrogation Session: 20180314055043
HIGH POWER IMPEDANCE MEASURED VALUE: 71 Ohm
HighPow Impedance: 71 Ohm
Implantable Lead Implant Date: 20170608
Implantable Lead Location: 753860
Implantable Lead Model: 7122
Lead Channel Pacing Threshold Pulse Width: 0.5 ms
Lead Channel Sensing Intrinsic Amplitude: 11.9 mV
Lead Channel Setting Pacing Amplitude: 2.5 V
Lead Channel Setting Pacing Pulse Width: 0.5 ms
MDC IDC MSMT BATTERY REMAINING LONGEVITY: 96 mo
MDC IDC MSMT BATTERY REMAINING PERCENTAGE: 92 %
MDC IDC MSMT LEADCHNL RV IMPEDANCE VALUE: 430 Ohm
MDC IDC MSMT LEADCHNL RV PACING THRESHOLD AMPLITUDE: 0.75 V
MDC IDC PG IMPLANT DT: 20170608
MDC IDC SET LEADCHNL RV SENSING SENSITIVITY: 0.5 mV
MDC IDC STAT BRADY RV PERCENT PACED: 1 %
Pulse Gen Serial Number: 7359513

## 2016-04-08 NOTE — Progress Notes (Signed)
Remote ICD transmission.   

## 2016-04-13 ENCOUNTER — Ambulatory Visit (HOSPITAL_COMMUNITY)
Admission: RE | Admit: 2016-04-13 | Discharge: 2016-04-13 | Disposition: A | Discharge status: Home / Self Care | Payer: PPO | Source: Ambulatory Visit | Attending: Cardiology | Admitting: Cardiology

## 2016-04-13 ENCOUNTER — Encounter (HOSPITAL_COMMUNITY): Payer: Self-pay

## 2016-04-13 VITALS — BP 107/56 | HR 65 | Wt 252.0 lb

## 2016-04-13 DIAGNOSIS — Z87891 Personal history of nicotine dependence: Secondary | ICD-10-CM | POA: Diagnosis not present

## 2016-04-13 DIAGNOSIS — N183 Chronic kidney disease, stage 3 (moderate): Secondary | ICD-10-CM | POA: Insufficient documentation

## 2016-04-13 DIAGNOSIS — E785 Hyperlipidemia, unspecified: Secondary | ICD-10-CM | POA: Diagnosis not present

## 2016-04-13 DIAGNOSIS — I5022 Chronic systolic (congestive) heart failure: Secondary | ICD-10-CM

## 2016-04-13 DIAGNOSIS — I513 Intracardiac thrombosis, not elsewhere classified: Secondary | ICD-10-CM | POA: Diagnosis not present

## 2016-04-13 DIAGNOSIS — E1122 Type 2 diabetes mellitus with diabetic chronic kidney disease: Secondary | ICD-10-CM | POA: Diagnosis not present

## 2016-04-13 DIAGNOSIS — I2510 Atherosclerotic heart disease of native coronary artery without angina pectoris: Secondary | ICD-10-CM | POA: Diagnosis not present

## 2016-04-13 DIAGNOSIS — Z95810 Presence of automatic (implantable) cardiac defibrillator: Secondary | ICD-10-CM | POA: Diagnosis not present

## 2016-04-13 DIAGNOSIS — Z7901 Long term (current) use of anticoagulants: Secondary | ICD-10-CM | POA: Diagnosis not present

## 2016-04-13 DIAGNOSIS — Z9889 Other specified postprocedural states: Secondary | ICD-10-CM | POA: Insufficient documentation

## 2016-04-13 DIAGNOSIS — Z794 Long term (current) use of insulin: Secondary | ICD-10-CM | POA: Diagnosis not present

## 2016-04-13 DIAGNOSIS — Z951 Presence of aortocoronary bypass graft: Secondary | ICD-10-CM

## 2016-04-13 DIAGNOSIS — M25562 Pain in left knee: Secondary | ICD-10-CM | POA: Diagnosis not present

## 2016-04-13 DIAGNOSIS — I255 Ischemic cardiomyopathy: Secondary | ICD-10-CM | POA: Insufficient documentation

## 2016-04-13 DIAGNOSIS — Z79899 Other long term (current) drug therapy: Secondary | ICD-10-CM | POA: Diagnosis not present

## 2016-04-13 NOTE — Patient Instructions (Signed)
You have been referred to Dr Dorene GrebeScott Dean at [facility]   Your physician recommends that you schedule a follow-up appointment in: 3 months

## 2016-04-14 NOTE — Progress Notes (Signed)
Patient ID: Bradley Benitez, male   DOB: 03-07-1950, 66 y.o.   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   Admitted to [facility] the end of December with chest pain. LHC showed 3VD and he underwent CABG. Hospital course was complicated by V fib on 02/05/15 required 9 defibrillations. Loaded on amio. He was not placed on bb due to low output. Placed on eliquis for LV thrombus due to concerns about compliance with coumadin. Discharge weight 208 pounds.   Repeat echo in 5/17 with EF remaining 30-35%.  He had St Jude ICD placed in 6/17.   He returns follow up.  Weight is stable.  He has not been very active because of left knee pain.  Knee appears to have effusion.  Has not seen ortho yet.  He still gets "groggy" after taking his meds, but BP ok today.  No exertional dyspnea (though not particularly active with knee pain).  No chest pain.     Labs (1/17): K 4.1, creatinine 1.14 Labs (2/17): LFTs normal, TSH normal Labs (04/01/2015): K 4.5 Creatinine 1.32. Dig level 1.7  Labs (5/17): K 4.5, creatinine 1.6 Labs (6/17): K 4.3, creatinine 1.49, LDL 54, HDL 33 Labs (08/2015): K 4.0 Creatinine 1.56 Labs (12/17): K 4.3, creatinine 1.28 Labs (2/18): K 4.4, creatinine 1.37, BNP 195  Corevue: No evidence today for volume overload.   LHC/RHC (12/16): RA mean 11 PA 35/11 PCWP mean 23 CI 2.59   Ost RPDA lesion, 70% stenosed.  1st RPLB lesion, 80% stenosed.  Mid Cx lesion, 80% stenosed.  Prox LAD lesion, 60% stenosed.  Mid LAD lesion, 95% stenosed.  Ost 2nd Diag lesion, 70% stenosed.  Ost Ramus to Ramus lesion, 90% stenosed  Cardiac MRI (1/17): 1. Severe LV systolic dysfunction, EF 26%. Wall motion abnormalities as above. 2. Normal RV size and systolic function . 3. LGE pattern suggestive of infarction in LAD territory. The  mid anterior/anteroseptal and apical anterior/septal/inferior wall segments and the true apex do not appear viable (probably not likely to improve function with revascularization). The remainder of the left ventricle appears viable. 4. There is a small LV apical thrombus.  ECHO (1/17): EF 20-25% with apical thombus.  ECHO (5/17): EF 30-35%, no mention of apical thrombus, moderate MR Peripheral arterial dopplers (6/17): normal study.   ROS: All systems negative except as listed in HPI, PMH and Problem List.  SH:  Social History   Social History  . Marital status: Single    Spouse name: N/A  . Number of children: N/A  . Years of education: N/A   Occupational History  . Not on file.   Social History Main Topics  . Smoking status: Former Smoker    Packs/day: 3.00    Years: 20.00    Types: Cigarettes    Quit date: 01/26/1994  . Smokeless tobacco: Never Used  . Alcohol use No  . Drug use: Yes     Comment: Previously smoked marijuana, none in 7 years  . Sexual activity: Not on file   Other Topics Concern  . Not on file   Social History Narrative  . No narrative on file    FH:  Family History  Problem Relation Age of Onset  . Diabetes Mother   . Diabetes Father   . Heart failure Brother  Past Medical History:  Diagnosis Date  . DM2 (diabetes mellitus, type 2) (HCC)   . Gangrene (HCC)    10+ years ago    Current Outpatient Prescriptions  Medication Sig Dispense Refill  . apixaban (ELIQUIS) 5 MG TABS tablet Take 1 tablet (5 mg total) by mouth 2 (two) times daily. 180 tablet 3  . atorvastatin (LIPITOR) 80 MG tablet Take 1 tablet (80 mg total) by mouth daily. 90 tablet 3  . carvedilol (COREG) 12.5 MG tablet Take 1 tablet (12.5 mg total) by mouth 2 (two) times daily with a meal. 180 tablet 3  . furosemide (LASIX) 20 MG tablet Take 1-2 tablets (20-40 mg total) by mouth 2 (two) times daily. Take 40 mg (2 tablets) in the morning and 20 mg (1 tablet) in the afternoon  270 tablet 3  . gabapentin (NEURONTIN) 300 MG capsule Take 1 capsule (300 mg total) by mouth at bedtime. 90 capsule 3  . insulin aspart (NOVOLOG) 100 UNIT/ML injection Inject 5-10 Units into the skin 3 (three) times daily before meals. Per sliding scale:    . insulin detemir (LEVEMIR) 100 UNIT/ML injection Inject 0.2 mLs (20 Units total) into the skin daily. 10 mL 11  . Menthol, Topical Analgesic, (BIOFREEZE EX) Apply 1 application topically at bedtime.    Marland Kitchen OVER THE COUNTER MEDICATION Place 1 drop into both eyes daily as needed (dry eyes). Over the counter lubricating eye drops from [employer]    . sacubitril-valsartan (ENTRESTO) 49-51 MG Take 1 tablet by mouth 2 (two) times daily. 180 tablet 3  . spironolactone (ALDACTONE) 25 MG tablet Take 1 tablet (25 mg total) by mouth daily. 90 tablet 3   No current facility-administered medications for this encounter.     Vitals:   04/13/16 1433  BP: (!) 107/56  Pulse: 65  SpO2: 100%  Weight: 252 lb (114.3 kg)   PHYSICAL EXAM: General:  NAD HEENT: normal Neck: supple. JVP not elevated. Carotids 2+ bilaterally; no bruits. No lymphadenopathy or thryomegaly appreciated. Cor: PMI normal. Regular rate & rhythm. No rubs, gallops or murmurs. Sternal scar.  Unable to palpate pedal pulses. Lungs: clear Abdomen: soft, nontender, nondistended. No hepatosplenomegaly. No bruits or masses. Good bowel sounds. Extremities: no cyanosis, clubbing, rash.  Trace ankle edema.  Neuro: alert & orientedx3, cranial nerves grossly intact. Moves all 4 extremities w/o difficulty. Affect pleasant.  ASSESSMENT & PLAN: 1. 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  - He now has St Jude ICD and is off amiodarone.  - Continue Coreg.  2. Chronic systolic CHF: EF 16% with regional WMAs by cardiac MRI, ischemic cardiomyopathy. S/P CABG x 4. NYHA II symptoms.  Repeat echo in 5/17 showed that EF remains low, 30-35%.  Now with Medtronic ICD. He is not  volume overloaded by exam or Corevue. - Continue current Coreg.   - Off digoxin due to elevated level  - Continue Entresto 49/51 bid. Don't think we have BP room to increase.   - Continue spironolactone 25 mg daily. - Continue Lasix 40 qam/20 qpm.    3. CAD: Diffuse 3 vessel disease, see cath report above. MRI reviewed: LAD territory did not appear to have significant viability. Remainder of LV myocardium did appear viable. He had severe disease in LCx and RCA territory. s/p CABG x 4 in 1/17. Suspect ventricular fibrillation arrest 02/05/15 was scar-mediated as opposed to occlusion of graft, etc.  No chest pain.  - No ASA as on apixaban with stable CAD.  - Continue  statin.   4. LV mural thrombus: Seen on 1/17 cardiac MRI.  Not reported on most recent echo in 5/17. Continue apixaban 5 mg twice a day. Not started on coumadin initially due to concerns about compliance, but he has been quite good with followup. 5. Hyperlipidemia: Good lipids in 6/17.  6. CKD: Stage III.  7. Left knee arthralgias/effusion: Significant limitation to his activity.  Needs orthopedics evaluation, we will make the appointment today.   Followup in 3 months.   Marca AnconaDalton McLean 04/14/2016

## 2016-04-16 MED FILL — UNIFINE PENTIPS 31GX3/16": 31G X 5 MM | 90 days supply | Qty: 400 | Fill #2

## 2016-04-16 MED FILL — LEVEMIR FLEXTOUCH 100 UNITS: 100 | 90 days supply | Qty: 18 | Fill #2

## 2016-04-16 MED FILL — NOVOLOG FLEXPEN SYRINGE: 100 | 80 days supply | Qty: 12 | Fill #4

## 2016-04-16 MED FILL — UNIFINE PENTIPS 31GX3/16: 31G X 5 MM | 90 days supply | Qty: 400 | Fill #2

## 2016-04-20 ENCOUNTER — Telehealth (HOSPITAL_COMMUNITY): Payer: Self-pay | Admitting: Pharmacist

## 2016-04-20 ENCOUNTER — Other Ambulatory Visit: Payer: Self-pay | Admitting: Internal Medicine

## 2016-04-20 NOTE — Telephone Encounter (Signed)
[name] wanted to verify that [employer] does not cover Eliquis. We discussed that currently the [employer] only covers his HF medications and Eliquis is not being used for his HF. He verbalized understanding.   Tyler DeisErika K. Bonnye FavaNicolsen, PharmD, BCPS, CPP Clinical Pharmacist Pager: [phone] Phone: [phone] 04/20/2016 4:57 PM

## 2016-04-22 ENCOUNTER — Ambulatory Visit (INDEPENDENT_AMBULATORY_CARE_PROVIDER_SITE_OTHER): Payer: PPO

## 2016-04-22 ENCOUNTER — Ambulatory Visit (INDEPENDENT_AMBULATORY_CARE_PROVIDER_SITE_OTHER): Payer: PPO | Admitting: Orthopedic Surgery

## 2016-04-22 ENCOUNTER — Encounter (INDEPENDENT_AMBULATORY_CARE_PROVIDER_SITE_OTHER): Payer: Self-pay | Admitting: Orthopedic Surgery

## 2016-04-22 DIAGNOSIS — M25562 Pain in left knee: Secondary | ICD-10-CM
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
## 2016-05-12 ENCOUNTER — Telehealth: Payer: Self-pay | Admitting: Cardiology
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
## 2016-05-13 ENCOUNTER — Ambulatory Visit (INDEPENDENT_AMBULATORY_CARE_PROVIDER_SITE_OTHER): Payer: Self-pay | Admitting: *Deleted

## 2016-05-13 DIAGNOSIS — I5022 Chronic systolic (congestive) heart failure: Secondary | ICD-10-CM

## 2016-05-14 NOTE — Progress Notes (Signed)
Patient presents to the office to get a new Merlin monitor. Bradley Benitez w/ [employer] [employer] paired patient's monitor and provided education. Patient to f/u with Dr.Taylor as scheduled.

## 2016-05-18 ENCOUNTER — Telehealth: Payer: Self-pay | Admitting: Licensed Clinical Social Worker
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
## 2016-05-22 ENCOUNTER — Telehealth: Payer: Self-pay | Admitting: Licensed Clinical Social Worker

## 2016-05-22 ENCOUNTER — Telehealth (HOSPITAL_COMMUNITY): Payer: Self-pay | Admitting: *Deleted
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
## 2016-05-22 NOTE — Telephone Encounter (Signed)
Bradley Benitez paid over the phone for patients quad cane at [facility]. Pt notified to pick it up today before 5:30.
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
## 2016-05-25 ENCOUNTER — Telehealth: Payer: Self-pay | Admitting: Licensed Clinical Social Worker
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
## 2016-05-28 ENCOUNTER — Telehealth: Payer: Self-pay | Admitting: Licensed Clinical Social Worker
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
## 2016-05-29 ENCOUNTER — Other Ambulatory Visit (HOSPITAL_COMMUNITY): Payer: Self-pay

## 2016-05-29 NOTE — Progress Notes (Signed)
Paramedicine Encounter    Patient ID: Bradley Benitez, male    DOB: July 17, 1950, 66 y.o.   MRN: [id number]    Patient Care Team: Pearson Grippe, MD as PCP - General (Internal Medicine)  Patient Active Problem List   Diagnosis Date Noted  . ICD (implantable cardioverter-defibrillator), single, in situ 10/07/2015  . Ischemic cardiomyopathy 07/04/2015  . Chronic systolic CHF (congestive heart failure) (HCC) 03/20/2015  . Ventricular fibrillation (HCC) 02/26/2015  . 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 Acute respiratory failure with hypoxemia (HCC) 01/23/2015  . 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 Poorly controlled type 2 diabetes mellitus with peripheral neuropathy (HCC) 01/23/2015  . 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 Acute systolic congestive heart failure, NYHA class 4 (HCC) 01/23/2015  . NSTEMI (non-ST elevated myocardial infarction) (HCC) 01/23/2015  . Mural thrombus of cardiac apex 01/23/2015  . Dyslipidemia 01/23/2015    Current Outpatient Prescriptions:  .  apixaban (ELIQUIS) 5 MG TABS tablet, Take 1 tablet (5 mg total) by mouth 2 (two) times daily., Disp: 180 tablet, Rfl: 3 .  atorvastatin (LIPITOR) 80 MG tablet, Take 1 tablet (80 mg total) by mouth daily., Disp: 90 tablet, Rfl: 3 .  carvedilol (COREG) 12.5 MG tablet, Take 1 tablet (12.5 mg total) by mouth 2 (two) times daily with a meal., Disp: 180 tablet, Rfl: 3 .  furosemide (LASIX) 20 MG tablet, Take 1-2 tablets (20-40 mg total) by mouth 2 (two) times daily. Take 40 mg (2 tablets) in the morning and 20 mg (1 tablet) in the afternoon, Disp: 270 tablet, Rfl: 3 .  gabapentin (NEURONTIN) 300 MG capsule, Take 1 capsule (300 mg total) by mouth at bedtime., Disp: 90 capsule, Rfl: 3 .  insulin aspart (NOVOLOG) 100 UNIT/ML injection, Inject 5-10 Units into the skin 3 (three) times daily before meals. Per sliding scale:, Disp: , Rfl:  .   insulin detemir (LEVEMIR) 100 UNIT/ML injection, Inject 0.2 mLs (20 Units total) into the skin daily., Disp: 10 mL, Rfl: 11 .  Menthol, Topical Analgesic, (BIOFREEZE EX), Apply 1 application topically at bedtime., Disp: , Rfl:  .  sacubitril-valsartan (ENTRESTO) 49-51 MG, Take 1 tablet by mouth 2 (two) times daily., Disp: 180 tablet, Rfl: 3 .  spironolactone (ALDACTONE) 25 MG tablet, Take 1 tablet (25 mg total) by mouth daily., Disp: 90 tablet, Rfl: 3 .  OVER THE COUNTER MEDICATION, Place 1 drop into both eyes daily as needed (dry eyes). Over the counter lubricating eye drops from [employer], Disp: , Rfl:  Allergies  Allergen Reactions  . Codeine Other (See Comments)    Pt does not remember reaction     Social History   Social History  . Marital status: Single    Spouse name: N/A  . Number of children: N/A  . Years of education: N/A   Occupational History  . Not on file.   Social History Main Topics  . Smoking status: Former Smoker    Packs/day: 3.00    Years: 20.00    Types: Cigarettes    Quit date: 01/26/1994  . Smokeless tobacco: Never Used  . Alcohol use No  . Drug use: Yes     Comment: Previously smoked marijuana, none in 7 years  . Sexual activity: Not on file   Other Topics Concern  . Not on file   Social History Narrative  . No narrative on file    Physical Exam  SAFE - 05/29/16 1441      Situation   Admitting diagnosis CFH   Heart failure history Exisiting   Comorbidities Atrial fibillation;DM;HTN;Hx MI/CAD   Readmitted within 30 days No   Hospital admission within past 12 months No   Target Weight 240 lbs     Assessment   Lives alone Yes   Primary support person Administrator, [employer]lee Baker   Mode of transportation personal car   Other services involved None   Home equipement Cane     Weight   Weighs self daily Yes   Scale provided Yes   Records on weight chart Yes     Resources   Has "Living better w/heart failure" book Yes   Has HF Zone tool Yes    Able to identify yellow zone signs/when to call MD Yes   Records zone daily Yes     Medications   Uses a pill box Yes   Who stocks the pill box self   Pill box checked this visit Yes   Pill box refilled this visit No   Difficulty obtaining medications No   Mail order medications No   Missed one or more doses of medications per week No     Nutrition   Patient receives meals on wheels No   Patient follows low sodium diet Yes   Has foods at home that meet the current recommended diet Yes   Patient follows low sugar/card diet Yes   Nutritional concerns/issues no     Activity Level   ADL's/Mobility Independent   How many feet can patient ambulate 150   Typical activity level Moderate   Barriers none     Urine   Difficulty urinating No   Changes in urine None     Time spent with patient   Time spent with patient  9145 Minutes         Community Paramedic Living Environment - 05/29/16 1400      Outside of House   Sidewalk and pathway to house is level and free from any hazards Yes   Driveway is free from debris/snow/ice Yes   Outside stairs are stable and have sturdy handrail N/A   Porch lights are working and provide adequate lighting Yes     Living Room   Furniture is of adequate height and offers arm rests that assist in getting up and down Yes   Floor is free from any clutter that would create tripping hazards Yes   All cords are either behind furniture or secured in a manner that does not cause trip hazards Yes   All rugs are secured to floor with double-sided tape Yes   Lighting is adequate to light room Yes   All lighting has an easily accessible on/off switch Yes   Phone is readily accessible near favorite seating areas Yes   Emergency numbers are printed near all phones in house N/A     Kitchen   Items used most often are within easy reach on low shelves Yes   Step stool is present, is sturdy and has a handrail No   Floor mats are non-slip tread and secured to  floor N/A   Oven controls are within easy reach N/A   Kitchen lighting is adequate and easy to reach switches Yes   ABC fire extinguisher is located in kitchen Yes     Stairs   Carpet is properly secured to stairs and/or all wood is properly secured Yes   Handrail is present and  sturdy N/A   Stairs are free from any clutter N/A   Stairway is adequately lit N/A     Bathroom   Tub and shower have a non-slip surface No   Tub and/or shower have a grab bar for stability Yes   Toilet has a raised seat No   Grab bar is attached near toilet for assistance Yes   Pathway from bedroom to bathroom is free from clutter and well lit for ease of movement in the middle of the night Yes     Bedroom   Floor is free from clutter Yes   Light is near bed and is easy to turn on Yes   Phone is next to bed and within easy reach Yes   Flashlight is near bed in case of emergency No     General   Smoke detectors in all areas of the house (each floor) and tested Yes   CO detectors on each floor of house and tested No   Flashlights are handy throughout the home No   Resident has all medical information readily available and in an area emergency providers will easily find Yes   All heaters are away from any type of flammable material N/A     Overall Tips   Homeowner ha good non-skid shoes to move around house Yes   All assisted walking devices are readily accessible and in good condition N/A   There is a phone near the floor for ease of reach in case of a fall YES   All O2 tubing is less than 50 ft. and is not a trip hazard N/A   Resident has had an annual hearing and vision check by a physician Yes   Resident has the proper hearing and visual aids prescribed and are in good working order Yes   All medications are properly stored and labeled to avoid confusion on dosage, time to take, and avoidance of missed doses Yes       Future Appointments Date Time Provider Department Center  07/14/2016 1:30 PM  [facility] PA/NP [facility] None  07/27/2016 4:00 PM Marinus Maw, MD [facility]    ATF pt CAO x4 with no complaints.  Pt stated that he feels sob and fatigue after taking his medications.  Pt stated that the symptoms usually goes away.  Pt fills his own pill box and his rx bottles were verified.  Pt did not have a scale therefore he was given one during chp visit. Pt was asked to weigh himself daily and record his weights daily.   [employer] brings pt breakfast, lunch and dinner.  Pt stated that has his own food in his room to eat during the weekend.  BP 116/70 (BP Location: Left Arm, Patient Position: Sitting, Cuff Size: Normal)   Pulse 73   Resp 16   Wt 241 lb (109.3 kg)   SpO2 98%   BMI 31.80 kg/m   cbg120 Weight yesterday-didn't weigh Last visit weight-252 (clinic visit)    Sandrine Bloodsworth, EMT Paramedic 05/29/2016    ACTION: Home visit completed Next visit planned for 1 week

## 2016-05-29 NOTE — Progress Notes (Signed)
Paramedicine Encounter    Patient ID: Bradley Benitez, male    DOB: Dec 15, 1950, 66 y.o.   MRN: [id number]    Patient Care Team: Pearson Grippe, MD as PCP - General (Internal Medicine)  Patient Active Problem List   Diagnosis Date Noted  . ICD (implantable cardioverter-defibrillator), single, in situ 10/07/2015  . Ischemic cardiomyopathy 07/04/2015  . Chronic systolic CHF (congestive heart failure) (HCC) 03/20/2015  . Ventricular fibrillation (HCC) 02/26/2015  . 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 Acute respiratory failure with hypoxemia (HCC) 01/23/2015  . 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 Poorly controlled type 2 diabetes mellitus with peripheral neuropathy (HCC) 01/23/2015  . 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 Acute systolic congestive heart failure, NYHA class 4 (HCC) 01/23/2015  . NSTEMI (non-ST elevated myocardial infarction) (HCC) 01/23/2015  . Mural thrombus of cardiac apex 01/23/2015  . Dyslipidemia 01/23/2015    Current Outpatient Prescriptions:  .  apixaban (ELIQUIS) 5 MG TABS tablet, Take 1 tablet (5 mg total) by mouth 2 (two) times daily., Disp: 180 tablet, Rfl: 3 .  atorvastatin (LIPITOR) 80 MG tablet, Take 1 tablet (80 mg total) by mouth daily., Disp: 90 tablet, Rfl: 3 .  carvedilol (COREG) 12.5 MG tablet, Take 1 tablet (12.5 mg total) by mouth 2 (two) times daily with a meal., Disp: 180 tablet, Rfl: 3 .  furosemide (LASIX) 20 MG tablet, Take 1-2 tablets (20-40 mg total) by mouth 2 (two) times daily. Take 40 mg (2 tablets) in the morning and 20 mg (1 tablet) in the afternoon, Disp: 270 tablet, Rfl: 3 .  gabapentin (NEURONTIN) 300 MG capsule, Take 1 capsule (300 mg total) by mouth at bedtime., Disp: 90 capsule, Rfl: 3 .  insulin aspart (NOVOLOG) 100 UNIT/ML injection, Inject 5-10 Units into the skin 3 (three) times daily before meals. Per sliding scale:, Disp: , Rfl:  .   insulin detemir (LEVEMIR) 100 UNIT/ML injection, Inject 0.2 mLs (20 Units total) into the skin daily., Disp: 10 mL, Rfl: 11 .  Menthol, Topical Analgesic, (BIOFREEZE EX), Apply 1 application topically at bedtime., Disp: , Rfl:  .  OVER THE COUNTER MEDICATION, Place 1 drop into both eyes daily as needed (dry eyes). Over the counter lubricating eye drops from [employer], Disp: , Rfl:  .  sacubitril-valsartan (ENTRESTO) 49-51 MG, Take 1 tablet by mouth 2 (two) times daily., Disp: 180 tablet, Rfl: 3 .  spironolactone (ALDACTONE) 25 MG tablet, Take 1 tablet (25 mg total) by mouth daily., Disp: 90 tablet, Rfl: 3 Allergies  Allergen Reactions  . Codeine Other (See Comments)    Pt does not remember reaction     Social History   Social History  . Marital status: Single    Spouse name: N/A  . Number of children: N/A  . Years of education: N/A   Occupational History  . Not on file.   Social History Main Topics  . Smoking status: Former Smoker    Packs/day: 3.00    Years: 20.00    Types: Cigarettes    Quit date: 01/26/1994  . Smokeless tobacco: Never Used  . Alcohol use No  . Drug use: Yes     Comment: Previously smoked marijuana, none in 7 years  . Sexual activity: Not on file   Other Topics Concern  . Not on file   Social History Narrative  . No narrative on file    Physical Exam  Eyes: Pupils are equal, round, and reactive to light.  Abdominal: He exhibits no distension. There is no tenderness. There is no guarding.  Musculoskeletal: He exhibits edema.  Skin: Skin is warm and dry. He is not diaphoretic.        Future Appointments Date Time Provider Department Center  07/14/2016 1:30 PM [facility] PA/NP [facility] None  07/27/2016 4:00 PM Marinus MawGregg W Taylor, MD [facility]    ATF pt CAO x4 with no complaints today.  Pt stated that after taking his medications he feel a "little groggy".   Pt was given a scale during chp visit.  There were no vitals taken for this  visit.  cbg 120 Weight yesterday-didn't weigh due not having a scale Last visit weight-252    Shanasia Ibrahim, EMT Paramedic 05/29/2016    ACTION: Home visit completed

## 2016-06-09 ENCOUNTER — Telehealth: Payer: Self-pay | Admitting: Licensed Clinical Social Worker
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
## 2016-06-09 NOTE — Telephone Encounter (Signed)
CSW contacted patient to follow up and offer assistance if needed. Patient did not answer and message left on voicemail for return call. Lasandra BeechJackie Marena Witts, LCSW, CCSW-MCS [phone]

## 2016-06-15 ENCOUNTER — Other Ambulatory Visit (HOSPITAL_COMMUNITY): Payer: Self-pay

## 2016-06-15 NOTE — Progress Notes (Signed)
Paramedicine Encounter    Patient ID: Bradley PilaDavid M Benitez, male    DOB: 1951/01/01, 66 y.o.   MRN: [id number]   Patient Care Team: Pearson GrippeKim, James, MD as PCP - General (Internal Medicine)  Patient Active Problem List   Diagnosis Date Noted  . ICD (implantable cardioverter-defibrillator), single, in situ 10/07/2015  . Ischemic cardiomyopathy 07/04/2015  . Chronic systolic CHF (congestive heart failure) (HCC) 03/20/2015  . Ventricular fibrillation (HCC) 02/26/2015  . 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 Acute respiratory failure with hypoxemia (HCC) 01/23/2015  . 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 Poorly controlled type 2 diabetes mellitus with peripheral neuropathy (HCC) 01/23/2015  . 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 Acute systolic congestive heart failure, NYHA class 4 (HCC) 01/23/2015  . NSTEMI (non-ST elevated myocardial infarction) (HCC) 01/23/2015  . Mural thrombus of cardiac apex 01/23/2015  . Dyslipidemia 01/23/2015    Current Outpatient Prescriptions:  .  apixaban (ELIQUIS) 5 MG TABS tablet, Take 1 tablet (5 mg total) by mouth 2 (two) times daily., Disp: 180 tablet, Rfl: 3 .  atorvastatin (LIPITOR) 80 MG tablet, Take 1 tablet (80 mg total) by mouth daily., Disp: 90 tablet, Rfl: 3 .  carvedilol (COREG) 12.5 MG tablet, Take 1 tablet (12.5 mg total) by mouth 2 (two) times daily with a meal., Disp: 180 tablet, Rfl: 3 .  furosemide (LASIX) 20 MG tablet, Take 1-2 tablets (20-40 mg total) by mouth 2 (two) times daily. Take 40 mg (2 tablets) in the morning and 20 mg (1 tablet) in the afternoon, Disp: 270 tablet, Rfl: 3 .  gabapentin (NEURONTIN) 300 MG capsule, Take 1 capsule (300 mg total) by mouth at bedtime., Disp: 90 capsule, Rfl: 3 .  insulin aspart (NOVOLOG) 100 UNIT/ML injection, Inject 5-10 Units into the skin 3 (three) times daily before meals. Per sliding scale:, Disp: , Rfl:  .  insulin  detemir (LEVEMIR) 100 UNIT/ML injection, Inject 0.2 mLs (20 Units total) into the skin daily., Disp: 10 mL, Rfl: 11 .  Menthol, Topical Analgesic, (BIOFREEZE EX), Apply 1 application topically at bedtime., Disp: , Rfl:  .  OVER THE COUNTER MEDICATION, Place 1 drop into both eyes daily as needed (dry eyes). Over the counter lubricating eye drops from [employer], Disp: , Rfl:  .  sacubitril-valsartan (ENTRESTO) 49-51 MG, Take 1 tablet by mouth 2 (two) times daily., Disp: 180 tablet, Rfl: 3 .  spironolactone (ALDACTONE) 25 MG tablet, Take 1 tablet (25 mg total) by mouth daily., Disp: 90 tablet, Rfl: 3 Allergies  Allergen Reactions  . Codeine Other (See Comments)    Pt does not remember reaction     Social History   Social History  . Marital status: Single    Spouse name: N/A  . Number of children: N/A  . Years of education: N/A   Occupational History  . Not on file.   Social History Main Topics  . Smoking status: Former Smoker    Packs/day: 3.00    Years: 20.00    Types: Cigarettes    Quit date: 01/26/1994  . Smokeless tobacco: Never Used  . Alcohol use No  . Drug use: Yes     Comment: Previously smoked marijuana, none in 7 years  . Sexual activity: Not on file   Other Topics Concern  . Not on file   Social History Narrative  . No narrative on file    Physical Exam  Constitutional:  He is oriented to person, place, and time.  Neck: Normal range of motion.  Cardiovascular: Normal rate and regular rhythm.   Pulmonary/Chest: Effort normal and breath sounds normal. No respiratory distress. He has no wheezes. He has no rales.  Abdominal: Soft. He exhibits no distension.  Musculoskeletal: Normal range of motion. He exhibits no edema.  Neurological: He is alert and oriented to person, place, and time.  Skin: Skin is warm and dry.        SAFE - 05/29/16 1441      Situation   Admitting diagnosis CFH   Heart failure history Exisiting   Comorbidities Atrial  fibillation;DM;HTN;Hx MI/CAD   Readmitted within 30 days No   Hospital admission within past 12 months No   Target Weight 240 lbs     Assessment   Lives alone Yes   Primary support person Administrator, [employer]   Mode of transportation personal car   Other services involved None   Home equipement Cane     Weight   Weighs self daily Yes   Scale provided Yes   Records on weight chart Yes     Resources   Has "Living better w/heart failure" book Yes   Has HF Zone tool Yes   Able to identify yellow zone signs/when to call MD Yes   Records zone daily Yes     Medications   Uses a pill box Yes   Who stocks the pill box self   Pill box checked this visit Yes   Pill box refilled this visit No   Difficulty obtaining medications No   Mail order medications No   Missed one or more doses of medications per week No     Nutrition   Patient receives meals on wheels No   Patient follows low sodium diet Yes   Has foods at home that meet the current recommended diet Yes   Patient follows low sugar/card diet Yes   Nutritional concerns/issues no     Activity Level   ADL's/Mobility Independent   How many feet can patient ambulate 150   Typical activity level Moderate   Barriers none     Urine   Difficulty urinating No   Changes in urine None     Time spent with patient   Time spent with patient  45 Minutes        Future Appointments Date Time Provider Department Center  07/14/2016 1:30 PM [facility] PA/NP [facility] None  07/27/2016 4:00 PM Marinus Maw, MD [facility]   BP 110/78 (BP Location: Right Arm, Patient Position: Sitting, Cuff Size: Normal)   Pulse 66   Resp 16   Wt 238 lb 12.8 oz (108.3 kg)   SpO2 98%   BMI 31.51 kg/m  Weight yesterday- Did not weigh  Last visit weight- 241 lbs CBG 348  Bradley Benitez was seen at home today and reports that his medications make him feel tired. He stated this has been happening since he began taking them over a year and a half ago.  He stated he otherwise feels well but has a lot of complaints about his living situation, specifically the cleanliness. His medications were verified though he will be running out on Thursday. He said he will be calling the medications in and would pick them up when they are ready.   Jacqualine Code, EMT 06/15/16  ACTION:  Home visit completed Next visit planned for 1 week

## 2016-06-18 MED FILL — ELIQUIS 5 MG TABLET: 5 | 90 days supply | Qty: 180 | Fill #1

## 2016-06-18 MED FILL — GABAPENTIN 300 MG CAPSULE: 300 | 90 days supply | Qty: 90 | Fill #3

## 2016-06-18 MED FILL — ATORVASTATIN 80 MG TABLET: 80 | 90 days supply | Qty: 90 | Fill #1

## 2016-06-18 MED FILL — CARVEDILOL 12.5 MG TABLET: 12.5 | 90 days supply | Qty: 180 | Fill #1

## 2016-06-18 MED FILL — FUROSEMIDE 20 MG TABLET: 20 | 90 days supply | Qty: 270 | Fill #1

## 2016-06-18 MED FILL — ENTRESTO 49 MG-51 MG TABLET: 49-51 | 90 days supply | Qty: 180 | Fill #1

## 2016-06-18 MED FILL — SPIRONOLACTONE 25 MG TABLET: 25 | 90 days supply | Qty: 90 | Fill #1

## 2016-06-26 ENCOUNTER — Telehealth (HOSPITAL_COMMUNITY): Payer: Self-pay

## 2016-06-26 NOTE — Telephone Encounter (Signed)
Mr Bradley Benitez stated he did not have time to see me today because "he was I a crisis period but it is not medically related." He did not offer any further information when I asked if he needed our assistance and said he had to go.

## 2016-07-13 ENCOUNTER — Telehealth (HOSPITAL_COMMUNITY): Payer: Self-pay | Admitting: Pharmacist

## 2016-07-13 MED FILL — LEVEMIR FLEXTOUCH 100 UNITS: 100 | 90 days supply | Qty: 18 | Fill #3

## 2016-07-13 MED FILL — UNIFINE PENTIPS 31GX3/16: 31G X 5 MM | 90 days supply | Qty: 400 | Fill #3

## 2016-07-13 MED FILL — UNIFINE PENTIPS 31GX3/16": 31G X 5 MM | 90 days supply | Qty: 400 | Fill #3

## 2016-07-13 NOTE — Telephone Encounter (Signed)
[name] called stating that he is out of his Novolog since he has been requiring more lately 2/2 elevated BG. I have advised him that these refills should come from his PCP, Dr. Selena BattenKim. He stated that when he called them, they told him to ask the pharmacist to call in the refill. He was confused and thought that was me, but the refill request should come from the Outpatient Pharmacy. I have called the Outpatient Pharmacy for him and was told that they already received the refills and are filling the Novolog, Levemir and pen needles today.   Bradley Benitez, PharmD, BCPS, CPP Clinical Pharmacist Pager: [phone] Phone: [phone] 07/13/2016 12:10 PM

## 2016-07-14 ENCOUNTER — Ambulatory Visit (HOSPITAL_COMMUNITY)
Admission: RE | Admit: 2016-07-14 | Discharge: 2016-07-14 | Disposition: A | Discharge status: Home / Self Care | Payer: PPO | Source: Ambulatory Visit | Attending: Internal Medicine | Admitting: Internal Medicine

## 2016-07-14 VITALS — BP 152/80 | HR 71 | Wt 238.6 lb

## 2016-07-14 DIAGNOSIS — Z95810 Presence of automatic (implantable) cardiac defibrillator: Secondary | ICD-10-CM | POA: Diagnosis not present

## 2016-07-14 DIAGNOSIS — Z794 Long term (current) use of insulin: Secondary | ICD-10-CM | POA: Insufficient documentation

## 2016-07-14 DIAGNOSIS — I255 Ischemic cardiomyopathy: Secondary | ICD-10-CM | POA: Diagnosis not present

## 2016-07-14 DIAGNOSIS — I2510 Atherosclerotic heart disease of native coronary artery without angina pectoris: Secondary | ICD-10-CM | POA: Insufficient documentation

## 2016-07-14 DIAGNOSIS — Z7901 Long term (current) use of anticoagulants: Secondary | ICD-10-CM | POA: Insufficient documentation

## 2016-07-14 DIAGNOSIS — R079 Chest pain, unspecified: Secondary | ICD-10-CM | POA: Insufficient documentation

## 2016-07-14 DIAGNOSIS — I513 Intracardiac thrombosis, not elsewhere classified: Secondary | ICD-10-CM | POA: Diagnosis not present

## 2016-07-14 DIAGNOSIS — I4901 Ventricular fibrillation: Secondary | ICD-10-CM | POA: Diagnosis not present

## 2016-07-14 DIAGNOSIS — E785 Hyperlipidemia, unspecified: Secondary | ICD-10-CM | POA: Insufficient documentation

## 2016-07-14 DIAGNOSIS — Z87891 Personal history of nicotine dependence: Secondary | ICD-10-CM | POA: Insufficient documentation

## 2016-07-14 DIAGNOSIS — E1122 Type 2 diabetes mellitus with diabetic chronic kidney disease: Secondary | ICD-10-CM | POA: Diagnosis not present

## 2016-07-14 DIAGNOSIS — I5022 Chronic systolic (congestive) heart failure: Secondary | ICD-10-CM

## 2016-07-14 DIAGNOSIS — N183 Chronic kidney disease, stage 3 unspecified: Secondary | ICD-10-CM

## 2016-07-14 DIAGNOSIS — Z79899 Other long term (current) drug therapy: Secondary | ICD-10-CM | POA: Diagnosis not present

## 2016-07-14 DIAGNOSIS — Z951 Presence of aortocoronary bypass graft: Secondary | ICD-10-CM | POA: Diagnosis not present

## 2016-07-14 LAB — BASIC METABOLIC PANEL
Anion gap: 9 (ref 5–15)
BUN: 26 mg/dL — AB (ref 6–20)
CALCIUM: 9.1 mg/dL (ref 8.9–10.3)
CO2: 21 mmol/L — ABNORMAL LOW (ref 22–32)
CREATININE: 1.61 mg/dL — AB (ref 0.61–1.24)
Chloride: 102 mmol/L (ref 101–111)
GFR calc Af Amer: 49 mL/min — ABNORMAL LOW (ref >60)
GFR calc non Af Amer: 43 mL/min — ABNORMAL LOW (ref >60)
Glucose, Bld: 348 mg/dL — ABNORMAL HIGH (ref 65–99)
Potassium: 4.5 mmol/L (ref 3.5–5.1)
Sodium: 132 mmol/L — ABNORMAL LOW (ref 135–145)

## 2016-07-14 MED FILL — NOVOLOG FLEXPEN SYRINGE: 100 | 90 days supply | Qty: 27 | Fill #0

## 2016-07-14 NOTE — Patient Instructions (Signed)
Routine lab work today. Will notify you of abnormal results, otherwise no news is good news!  Follow up 3 months with echocardiogram and Dr. Shirlee LatchMcLean in 3 months. Take all medication as prescribed the day of your appointment. Bring all medications with you to your appointment.  Do the following things EVERYDAY: 1) Weigh yourself in the morning before breakfast. Write it down and keep it in a log. 2) Take your medicines as prescribed 3) Eat low salt foods-Limit salt (sodium) to 2000 mg per day.  4) Stay as active as you can everyday 5) Limit all fluids for the day to less than 2 liters

## 2016-07-14 NOTE — Progress Notes (Signed)
Advanced Heart Failure Medication Review by a Pharmacist  Does the patient  feel that his/her medications are working for him/her?  yes  Has the patient been experiencing any side effects to the medications prescribed?  Yes - feels "whacked out" after taking medication, has to lie down and take a nap  Does the patient measure his/her own blood pressure or blood glucose at home?  no   Does the patient have any problems obtaining medications due to transportation or finances?   Yes - has been skipping insulin some days because of limited finances  Understanding of regimen: fair Understanding of indications: fair Potential of compliance: fair Patient understands to avoid NSAIDs. Patient understands to avoid decongestants.  Issues to address at subsequent visits: none   Pharmacist comments: [name] is a 66 yo male presenting without medication bottles. Per patient, has very limited finances and has skipped some novolog doses to make his supply last longer. Patient states that he is planning to take 10 units of levemir with each meal instead of novolog when he runs out. I advised him NOT to do this and explained long-acting nature of levemir. There was also confusion as to whether patient was taking losartan with entresto. Upon further questioning and confirmation with patient's pharmacy, is NOT taking losartan. Patient does complain that he feels lethargic after taking medications. Otherwise, patient has no other med issues at present. Patient is receiving financial assistance through [employer] for HF meds.   Allena Katzaroline E Koichi Platte, Pharm.D. PGY1 Pharmacy Resident 6/19/20182:07 PM Pager [phone]  Time with patient: 15 mins Preparation and documentation time: 6 mins Total time: 21 mins
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
## 2016-07-15 IMAGING — CR DG CHEST 2V
2 series · 2 of 2 positions shown · non-contrast
Comparison: February 10, 2015

CLINICAL DATA: Shortness of breath. Recent coronary artery bypass
grafting

EXAM:
CHEST  2 VIEW

[w chest pa *]
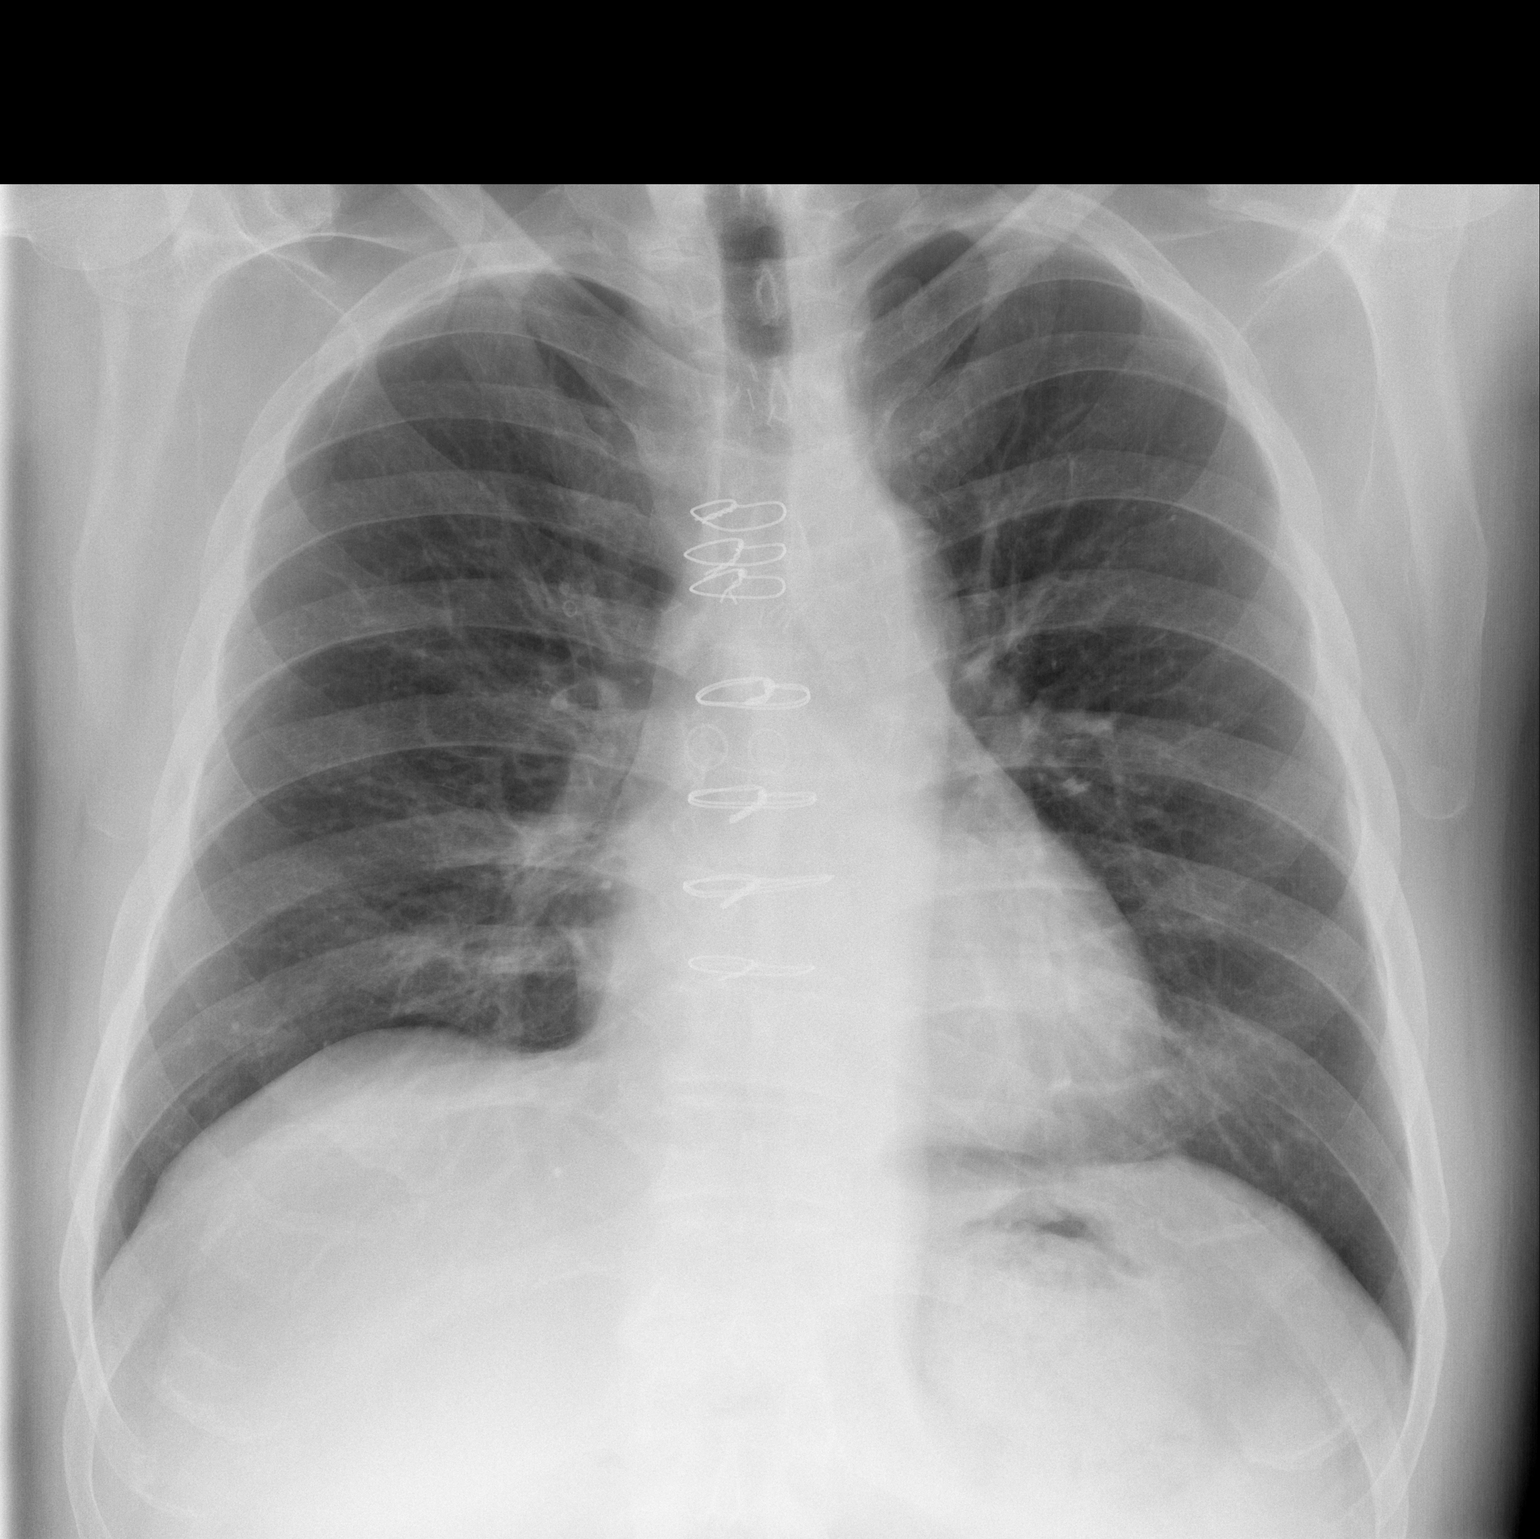

[w chest lat *]
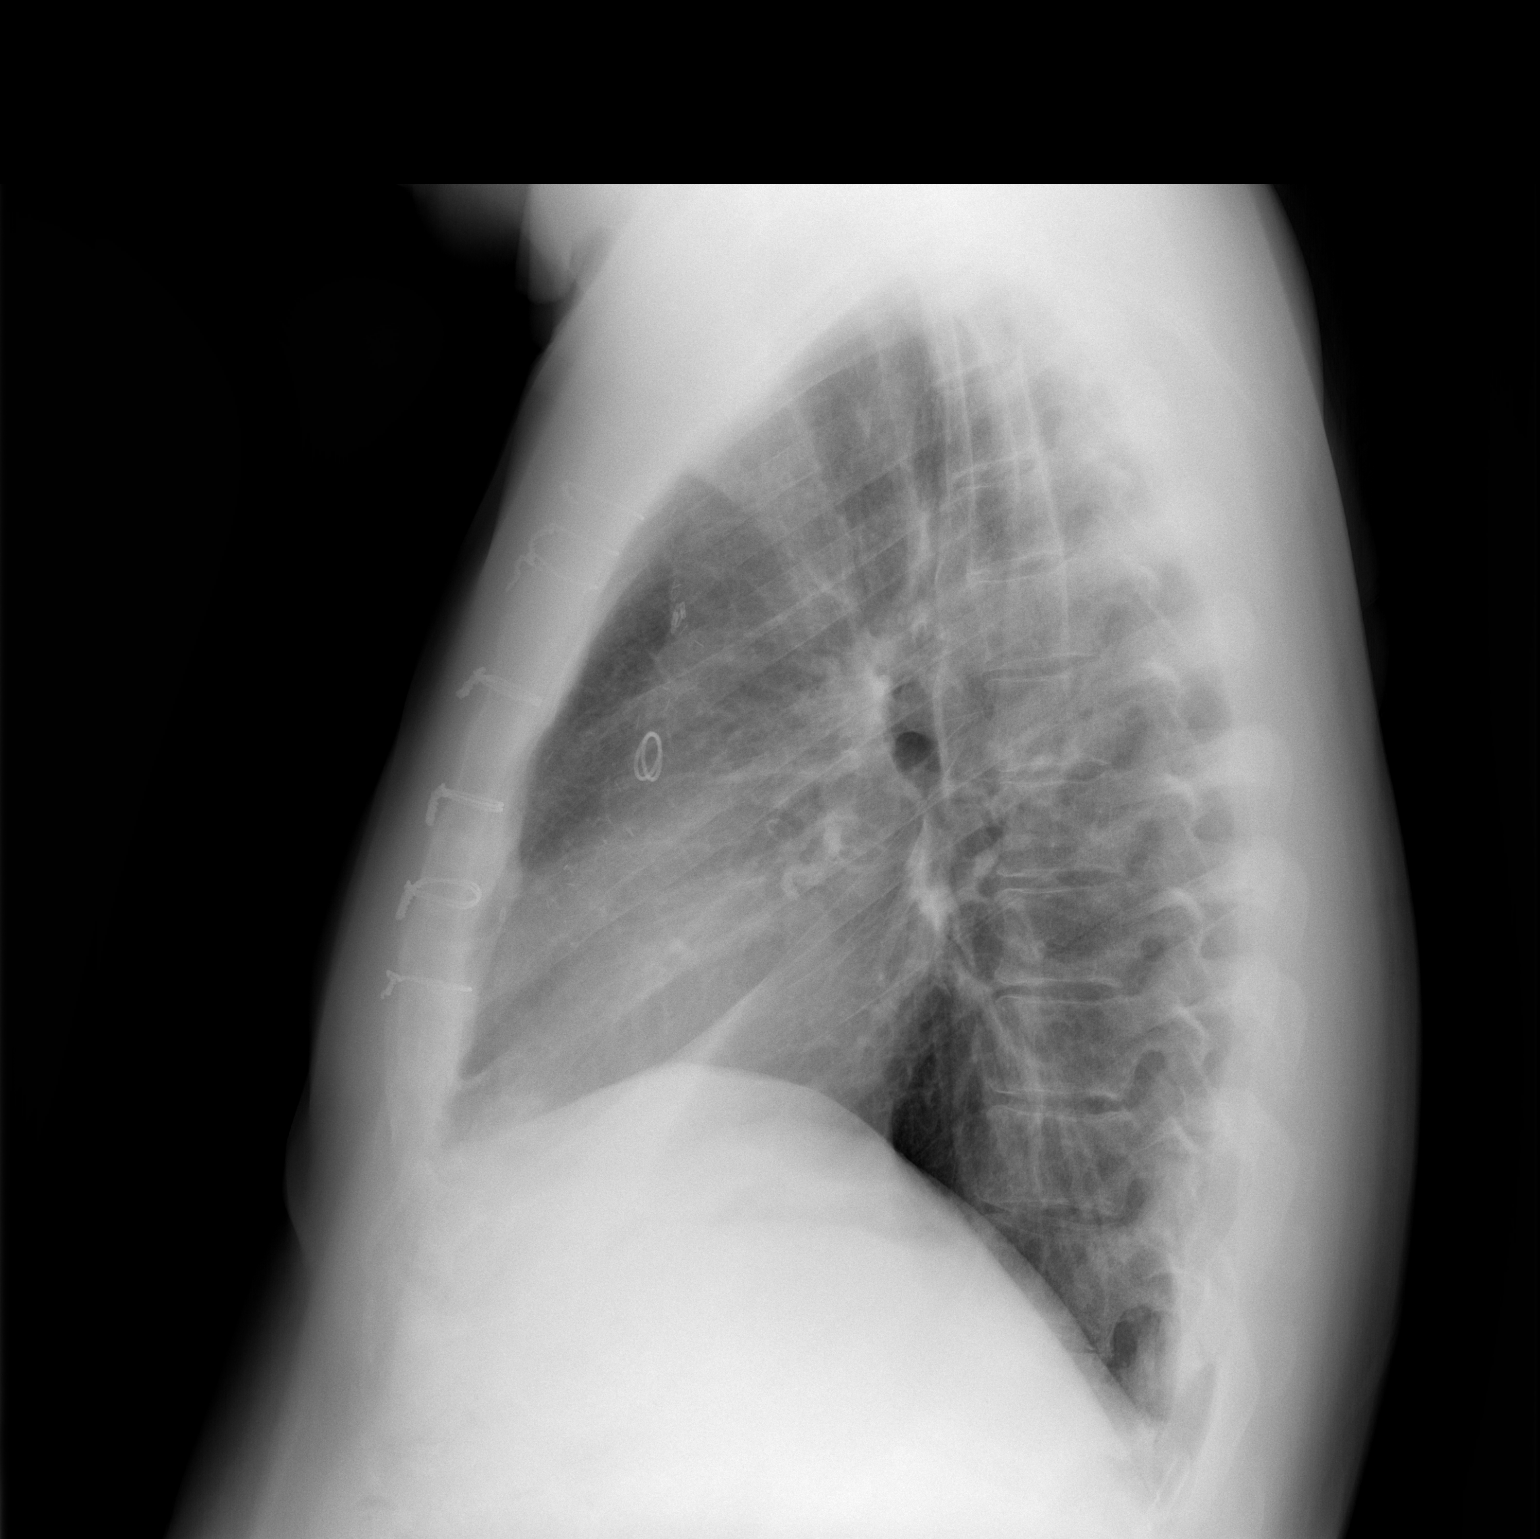

[2 of 2 positions shown; findings below may reference images not displayed]

FINDINGS: Patient is status post coronary artery bypass grafting. Lungs are
clear. Heart size and pulmonary vascularity are normal. No
adenopathy. No pneumothorax. No bone lesions.
IMPRESSION: No edema or consolidation.

## 2016-07-27 ENCOUNTER — Encounter (INDEPENDENT_AMBULATORY_CARE_PROVIDER_SITE_OTHER): Payer: Self-pay

## 2016-07-27 ENCOUNTER — Ambulatory Visit (INDEPENDENT_AMBULATORY_CARE_PROVIDER_SITE_OTHER): Payer: PPO | Admitting: Internal Medicine

## 2016-07-27 ENCOUNTER — Encounter: Payer: Self-pay | Admitting: Internal Medicine

## 2016-07-27 VITALS — BP 114/70 | HR 70 | Ht 72.0 in | Wt 243.6 lb

## 2016-07-27 DIAGNOSIS — I255 Ischemic cardiomyopathy: Secondary | ICD-10-CM | POA: Diagnosis not present

## 2016-07-27 DIAGNOSIS — I5022 Chronic systolic (congestive) heart failure: Secondary | ICD-10-CM

## 2016-07-27 DIAGNOSIS — Z95810 Presence of automatic (implantable) cardiac defibrillator: Secondary | ICD-10-CM

## 2016-07-27 DIAGNOSIS — I4901 Ventricular fibrillation: Secondary | ICD-10-CM | POA: Diagnosis not present
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
## 2016-07-27 NOTE — Patient Instructions (Signed)

## 2016-07-28 LAB — CUP PACEART INCLINIC DEVICE CHECK
Battery Remaining Longevity: 96 mo
Brady Statistic RV Percent Paced: 0.13 %
HighPow Impedance: 65.25 Ohm
Implantable Lead Implant Date: 20170608
Implantable Lead Location: 753860
Implantable Lead Model: 7122
Lead Channel Pacing Threshold Amplitude: 0.75 V
Lead Channel Pacing Threshold Amplitude: 0.75 V
Lead Channel Pacing Threshold Pulse Width: 0.5 ms
Lead Channel Setting Pacing Amplitude: 2.5 V
Lead Channel Setting Pacing Pulse Width: 0.5 ms
Lead Channel Setting Sensing Sensitivity: 0.5 mV
MDC IDC MSMT LEADCHNL RV IMPEDANCE VALUE: 475 Ohm
MDC IDC MSMT LEADCHNL RV PACING THRESHOLD PULSEWIDTH: 0.5 ms
MDC IDC MSMT LEADCHNL RV SENSING INTR AMPL: 11.9 mV
MDC IDC PG IMPLANT DT: 20170608
MDC IDC PG SERIAL: 7359513
MDC IDC SESS DTM: 20180702201826

## 2016-08-03 ENCOUNTER — Other Ambulatory Visit: Payer: Self-pay | Admitting: Internal Medicine

## 2016-09-22 MED FILL — GABAPENTIN 300 MG CAPSULE: 300 | 90 days supply | Qty: 90 | Fill #0

## 2016-09-22 MED FILL — CARVEDILOL 12.5 MG TABLET: 12.5 | 90 days supply | Qty: 180 | Fill #2

## 2016-09-22 MED FILL — FUROSEMIDE 20 MG TABLET: 20 | 90 days supply | Qty: 270 | Fill #2

## 2016-09-22 MED FILL — ELIQUIS 5 MG TABLET: 5 | 90 days supply | Qty: 180 | Fill #2

## 2016-09-22 MED FILL — ENTRESTO 49 MG-51 MG TABLET: 49-51 | 90 days supply | Qty: 180 | Fill #2

## 2016-09-22 MED FILL — ATORVASTATIN 80 MG TABLET: 80 | 90 days supply | Qty: 90 | Fill #2

## 2016-09-22 MED FILL — SPIRONOLACTONE 25 MG TABLET: 25 | 90 days supply | Qty: 90 | Fill #2

## 2016-10-13 ENCOUNTER — Encounter (HOSPITAL_COMMUNITY): Payer: Self-pay | Admitting: Cardiology

## 2016-10-13 ENCOUNTER — Ambulatory Visit (HOSPITAL_BASED_OUTPATIENT_CLINIC_OR_DEPARTMENT_OTHER)
Admission: RE | Admit: 2016-10-13 | Discharge: 2016-10-13 | Disposition: A | Discharge status: Home / Self Care | Payer: PPO | Source: Ambulatory Visit | Attending: Cardiology | Admitting: Cardiology

## 2016-10-13 ENCOUNTER — Ambulatory Visit (HOSPITAL_COMMUNITY)
Admission: RE | Admit: 2016-10-13 | Discharge: 2016-10-13 | Disposition: A | Discharge status: Home / Self Care | Payer: PPO | Source: Ambulatory Visit | Attending: Internal Medicine | Admitting: Internal Medicine

## 2016-10-13 VITALS — BP 113/72 | HR 63 | Wt 245.8 lb

## 2016-10-13 DIAGNOSIS — I255 Ischemic cardiomyopathy: Secondary | ICD-10-CM | POA: Diagnosis not present

## 2016-10-13 DIAGNOSIS — E785 Hyperlipidemia, unspecified: Secondary | ICD-10-CM | POA: Insufficient documentation

## 2016-10-13 DIAGNOSIS — Z79899 Other long term (current) drug therapy: Secondary | ICD-10-CM | POA: Diagnosis not present

## 2016-10-13 DIAGNOSIS — E1142 Type 2 diabetes mellitus with diabetic polyneuropathy: Secondary | ICD-10-CM | POA: Diagnosis not present

## 2016-10-13 DIAGNOSIS — E1122 Type 2 diabetes mellitus with diabetic chronic kidney disease: Secondary | ICD-10-CM | POA: Insufficient documentation

## 2016-10-13 DIAGNOSIS — Z794 Long term (current) use of insulin: Secondary | ICD-10-CM | POA: Diagnosis not present

## 2016-10-13 DIAGNOSIS — Z951 Presence of aortocoronary bypass graft: Secondary | ICD-10-CM | POA: Diagnosis not present

## 2016-10-13 DIAGNOSIS — I4901 Ventricular fibrillation: Secondary | ICD-10-CM | POA: Diagnosis not present

## 2016-10-13 DIAGNOSIS — I5022 Chronic systolic (congestive) heart failure: Secondary | ICD-10-CM | POA: Diagnosis not present

## 2016-10-13 DIAGNOSIS — Z95810 Presence of automatic (implantable) cardiac defibrillator: Secondary | ICD-10-CM | POA: Insufficient documentation

## 2016-10-13 DIAGNOSIS — E1165 Type 2 diabetes mellitus with hyperglycemia: Secondary | ICD-10-CM | POA: Diagnosis not present

## 2016-10-13 DIAGNOSIS — I110 Hypertensive heart disease with heart failure: Secondary | ICD-10-CM

## 2016-10-13 DIAGNOSIS — Z7901 Long term (current) use of anticoagulants: Secondary | ICD-10-CM | POA: Diagnosis not present

## 2016-10-13 DIAGNOSIS — I2510 Atherosclerotic heart disease of native coronary artery without angina pectoris: Secondary | ICD-10-CM | POA: Insufficient documentation

## 2016-10-13 DIAGNOSIS — Z87891 Personal history of nicotine dependence: Secondary | ICD-10-CM | POA: Insufficient documentation

## 2016-10-13 DIAGNOSIS — N183 Chronic kidney disease, stage 3 (moderate): Secondary | ICD-10-CM | POA: Insufficient documentation

## 2016-10-13 LAB — ECHOCARDIOGRAM COMPLETE: WEIGHTICAEL: 3932 [oz_av]

## 2016-10-13 LAB — BASIC METABOLIC PANEL
Anion gap: 10 (ref 5–15)
BUN: 19 mg/dL (ref 6–20)
CALCIUM: 9 mg/dL (ref 8.9–10.3)
CO2: 22 mmol/L (ref 22–32)
CREATININE: 1.38 mg/dL — AB (ref 0.61–1.24)
Chloride: 103 mmol/L (ref 101–111)
GFR calc Af Amer: 59 mL/min — ABNORMAL LOW (ref >60)
GFR, EST NON AFRICAN AMERICAN: 51 mL/min — AB (ref >60)
Glucose, Bld: 213 mg/dL — ABNORMAL HIGH (ref 65–99)
Potassium: 4.7 mmol/L (ref 3.5–5.1)
SODIUM: 135 mmol/L (ref 135–145)

## 2016-10-13 MED ORDER — FUROSEMIDE 40 MG PO TABS: 40.0000 mg | ORAL_TABLET | Freq: Two times a day (BID) | ORAL | 3 refills | Status: DC

## 2016-10-13 MED FILL — FUROSEMIDE 40 MG TABLET: 40 | 15 days supply | Qty: 30 | Fill #0
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
## 2016-10-13 NOTE — Progress Notes (Signed)
  Echocardiogram 2D Echocardiogram has been performed.  Bradley Benitez 10/13/2016, 2:20 PM

## 2016-10-13 NOTE — Patient Instructions (Signed)
Increase Furosamide (Lasix)  40 mg (1 tab) twice a day  Labs drawn today (if we do not contact you your labs were stable)  Your physician recommends that you return for lab work in: 10 days   Your physician recommends that you schedule a follow-up appointment in: 3 months

## 2016-10-23 ENCOUNTER — Ambulatory Visit (HOSPITAL_COMMUNITY)
Admission: RE | Admit: 2016-10-23 | Discharge: 2016-10-23 | Disposition: A | Discharge status: Home / Self Care | Payer: PPO | Source: Ambulatory Visit | Attending: Cardiology | Admitting: Cardiology

## 2016-10-23 DIAGNOSIS — I5022 Chronic systolic (congestive) heart failure: Secondary | ICD-10-CM | POA: Insufficient documentation

## 2016-10-23 LAB — BASIC METABOLIC PANEL
Anion gap: 6 (ref 5–15)
BUN: 24 mg/dL — AB (ref 6–20)
CHLORIDE: 103 mmol/L (ref 101–111)
CO2: 25 mmol/L (ref 22–32)
Calcium: 8.8 mg/dL — ABNORMAL LOW (ref 8.9–10.3)
Creatinine, Ser: 1.38 mg/dL — ABNORMAL HIGH (ref 0.61–1.24)
GFR, EST AFRICAN AMERICAN: 59 mL/min — AB (ref >60)
GFR, EST NON AFRICAN AMERICAN: 51 mL/min — AB (ref >60)
Glucose, Bld: 255 mg/dL — ABNORMAL HIGH (ref 65–99)
POTASSIUM: 4.8 mmol/L (ref 3.5–5.1)
SODIUM: 134 mmol/L — AB (ref 135–145)

## 2016-10-26 ENCOUNTER — Ambulatory Visit (INDEPENDENT_AMBULATORY_CARE_PROVIDER_SITE_OTHER): Payer: PPO | Admitting: *Deleted

## 2016-10-26 ENCOUNTER — Telehealth: Payer: Self-pay | Admitting: Cardiology

## 2016-10-26 DIAGNOSIS — I255 Ischemic cardiomyopathy: Secondary | ICD-10-CM | POA: Diagnosis not present

## 2016-10-26 NOTE — Telephone Encounter (Signed)
LMOVM reminding pt to send remote transmission.   

## 2016-10-27 NOTE — Progress Notes (Signed)
Remote ICD transmission.   

## 2016-10-28 ENCOUNTER — Encounter: Payer: Self-pay | Admitting: Cardiology

## 2016-10-29 LAB — CUP PACEART REMOTE DEVICE CHECK
Battery Remaining Percentage: 88 %
Brady Statistic RV Percent Paced: 1 %
HighPow Impedance: 64 Ohm
HighPow Impedance: 64 Ohm
Implantable Lead Location: 753860
Implantable Lead Model: 7122
Implantable Pulse Generator Implant Date: 20170608
Lead Channel Setting Pacing Amplitude: 2.5 V
Lead Channel Setting Pacing Pulse Width: 0.5 ms
Lead Channel Setting Sensing Sensitivity: 0.5 mV
MDC IDC LEAD IMPLANT DT: 20170608
MDC IDC MSMT BATTERY REMAINING LONGEVITY: 92 mo
MDC IDC MSMT BATTERY VOLTAGE: 3.05 V
MDC IDC MSMT LEADCHNL RV IMPEDANCE VALUE: 410 Ohm
MDC IDC MSMT LEADCHNL RV PACING THRESHOLD AMPLITUDE: 0.75 V
MDC IDC MSMT LEADCHNL RV PACING THRESHOLD PULSEWIDTH: 0.5 ms
MDC IDC MSMT LEADCHNL RV SENSING INTR AMPL: 11.9 mV
MDC IDC SESS DTM: 20181002080740
Pulse Gen Serial Number: 7359513

## 2016-10-30 ENCOUNTER — Other Ambulatory Visit: Payer: Self-pay | Admitting: Internal Medicine

## 2016-11-05 MED FILL — NOVOLOG FLEXPEN SYRINGE: 100 | 90 days supply | Qty: 27 | Fill #1

## 2016-11-05 MED FILL — UNIFINE PENTIPS 31GX3/16: 31G X 5 MM | 25 days supply | Qty: 100 | Fill #0

## 2016-11-05 MED FILL — UNIFINE PENTIPS 31GX3/16": 31G X 5 MM | 25 days supply | Qty: 100 | Fill #0

## 2016-11-05 MED FILL — LEVEMIR FLEXTOUCH 100 UNITS: 100 | 75 days supply | Qty: 15 | Fill #0

## 2016-11-06 IMAGING — DX DG CHEST 2V
2 series · 2 of 2 positions shown · non-contrast
Comparison: 03/13/2015 and earlier.

CLINICAL DATA: Implantable cardioverter-defibrillator placement.

EXAM:
CHEST  2 VIEW

[chest pa]
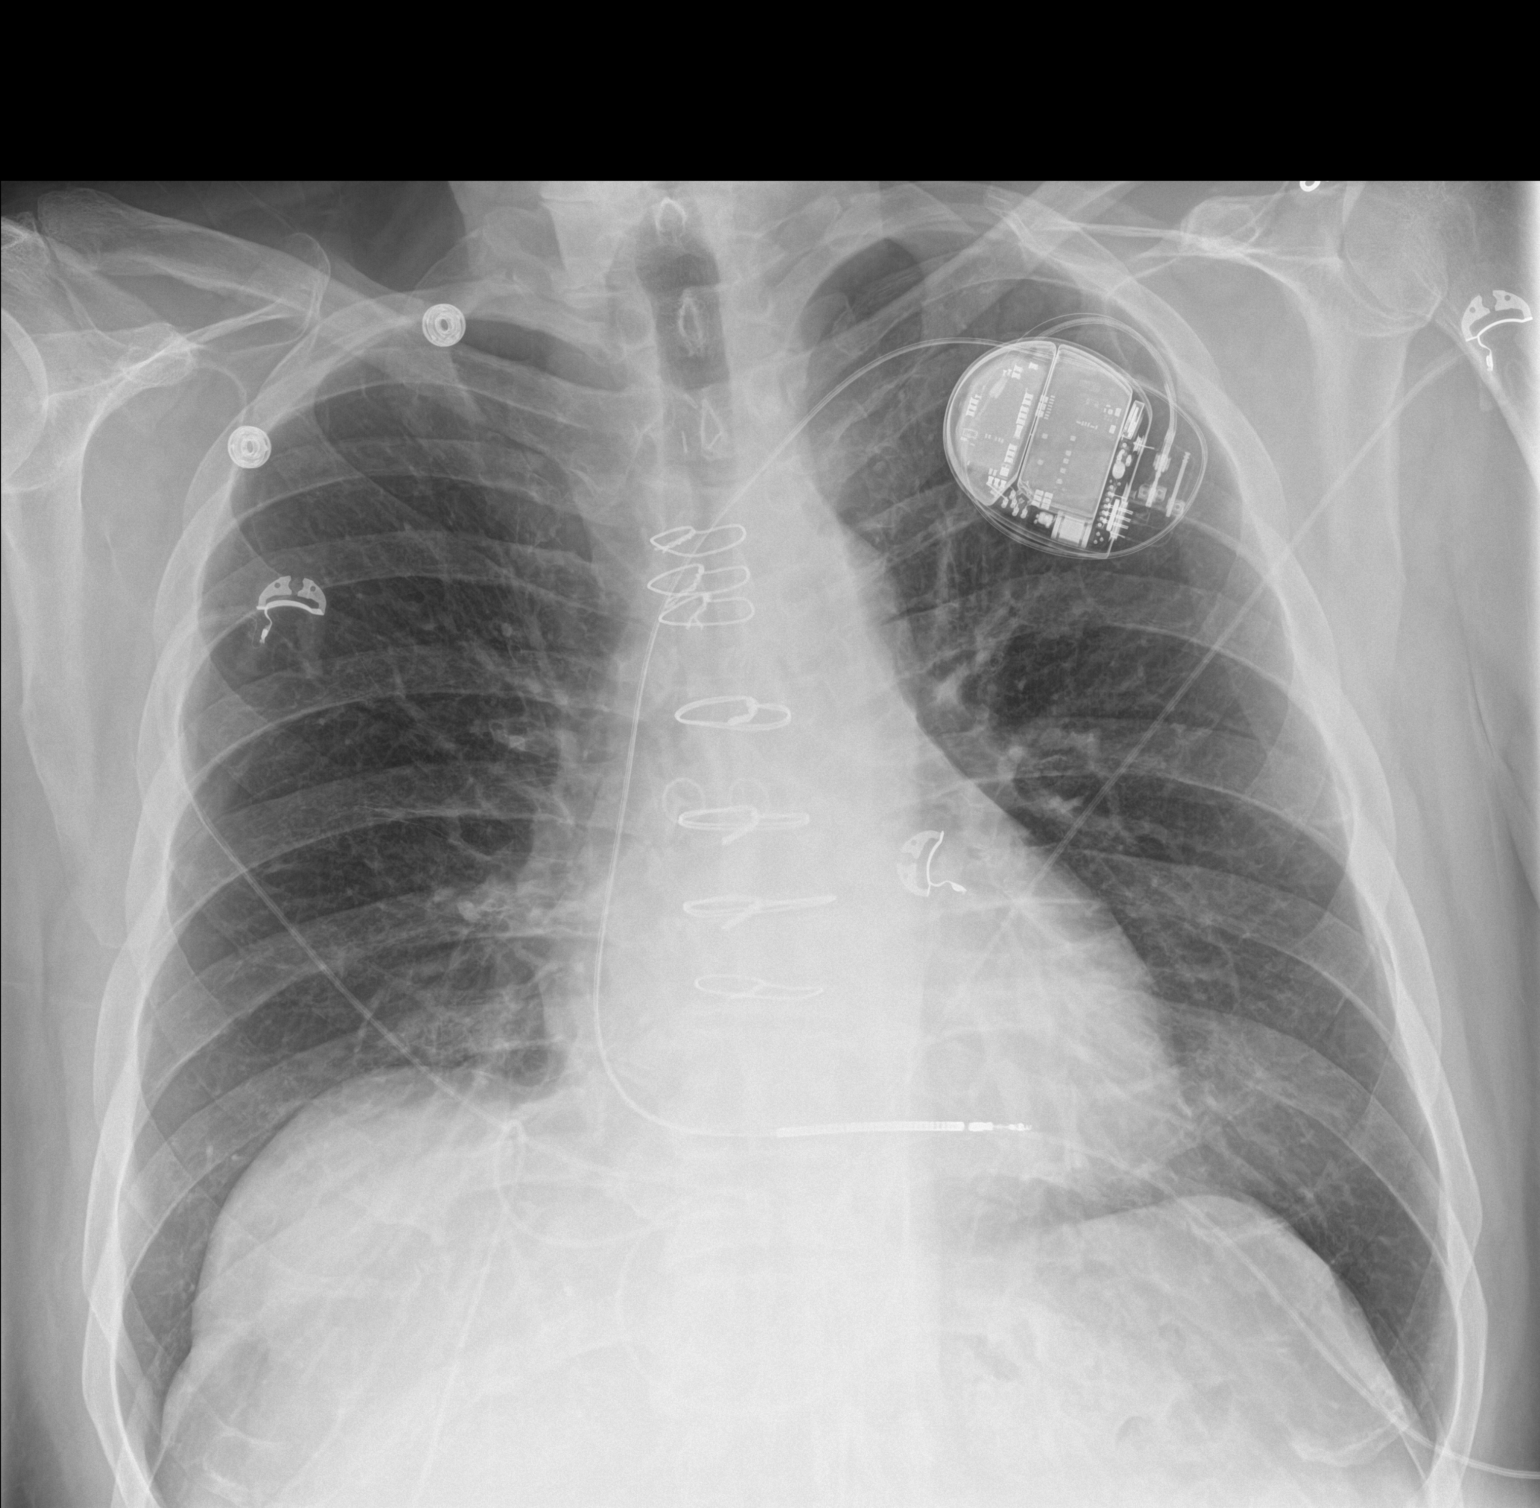

[chest lat]
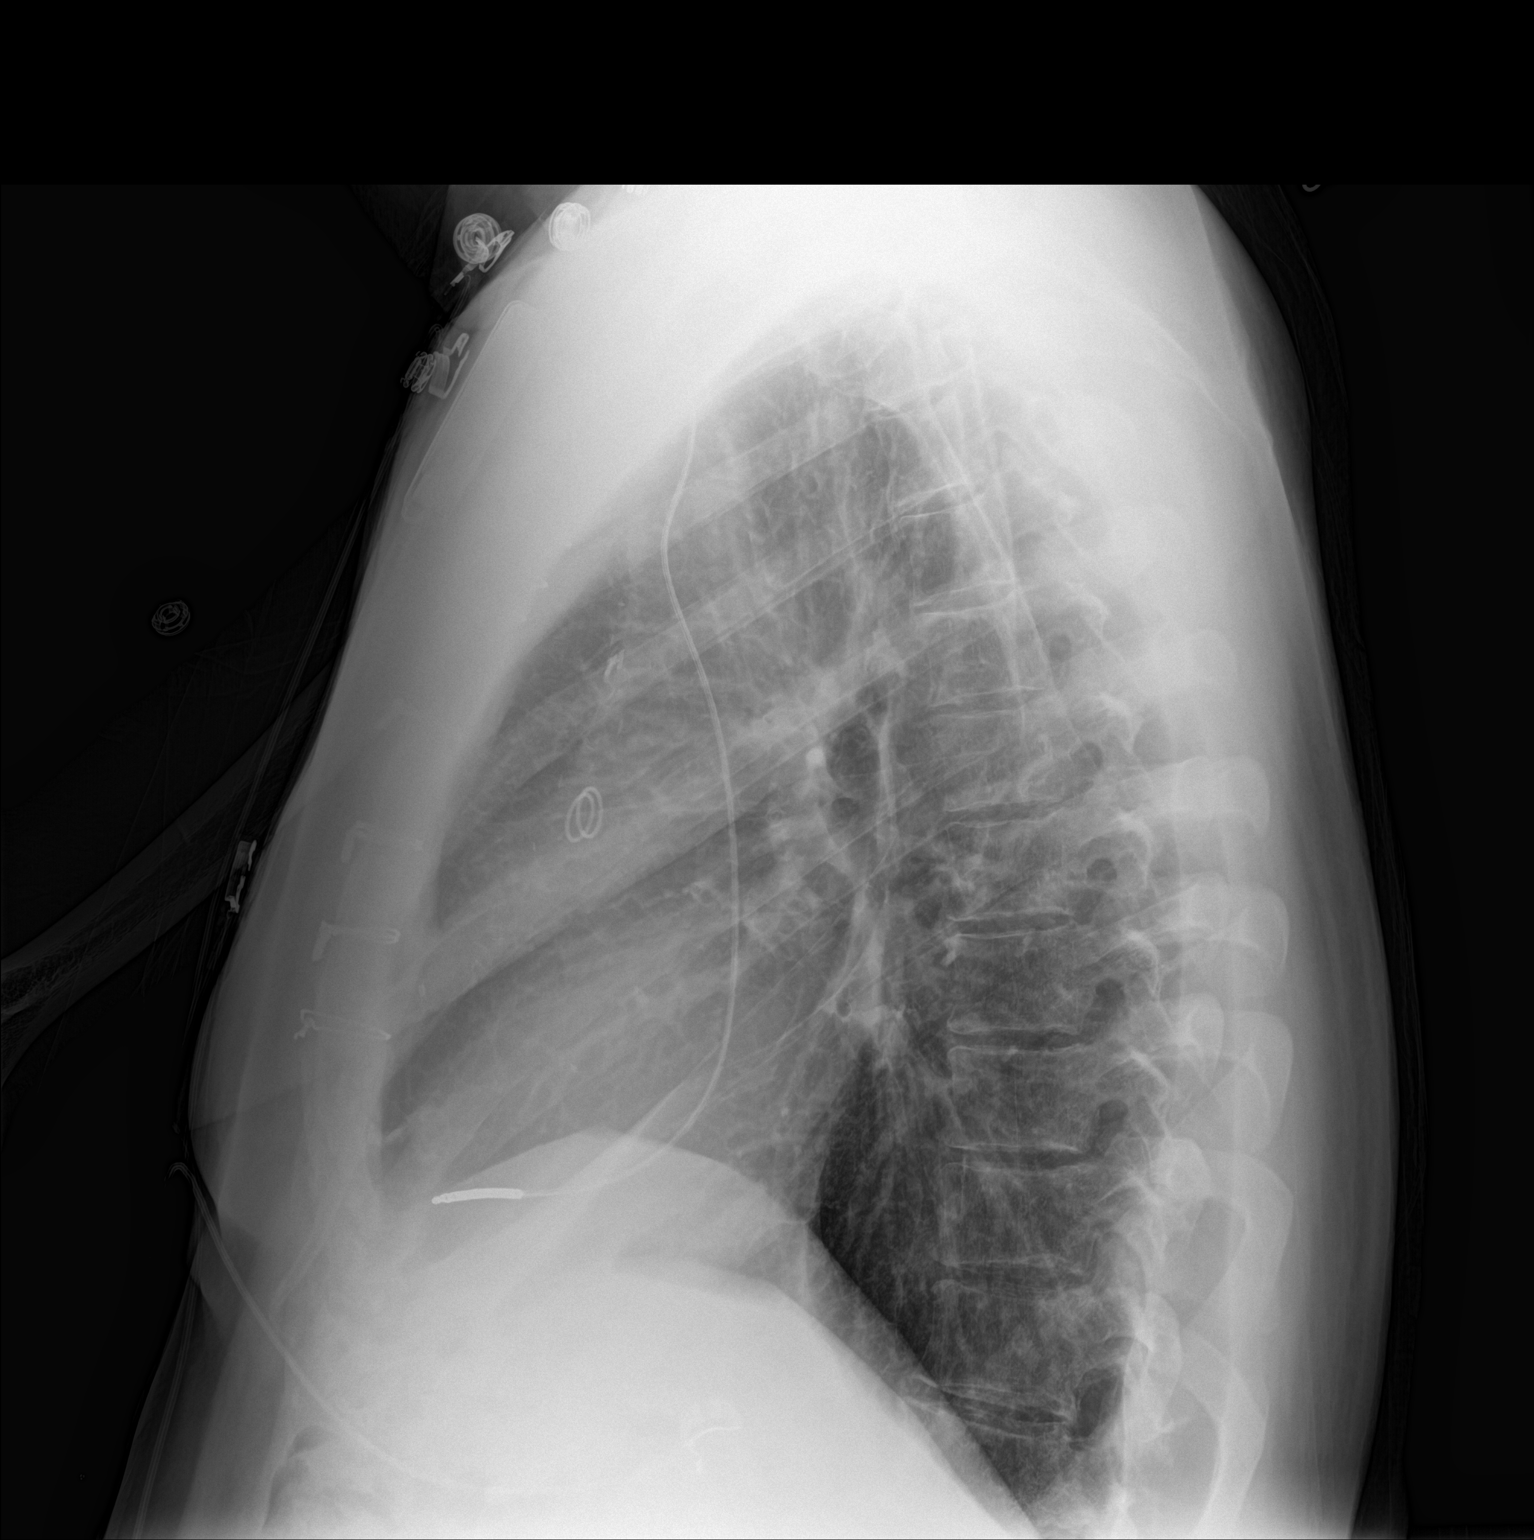

[2 of 2 positions shown; findings below may reference images not displayed]

FINDINGS: Left subclavian ICD with the lead tips at the expected location of
the right ventricular apex. No evidence of pneumothorax or
mediastinal hematoma. Prior sternotomy for CABG. Cardiac silhouette
mildly enlarged, unchanged. Lungs clear. Bronchovascular markings
normal. Pulmonary vascularity normal. No visible pleural effusions.
No pneumothorax. Visualized bony thorax intact.
IMPRESSION: 1. Left subclavian ICD appropriately positioned with no acute
complicating features.
2. Stable mild cardiomegaly.  No acute cardiopulmonary disease.

## 2016-11-24 DIAGNOSIS — E785 Hyperlipidemia, unspecified: Secondary | ICD-10-CM | POA: Diagnosis not present

## 2016-11-24 DIAGNOSIS — Z0000 Encounter for general adult medical examination without abnormal findings: Secondary | ICD-10-CM | POA: Diagnosis not present

## 2016-11-24 DIAGNOSIS — E119 Type 2 diabetes mellitus without complications: Secondary | ICD-10-CM | POA: Diagnosis not present

## 2016-11-24 DIAGNOSIS — Z125 Encounter for screening for malignant neoplasm of prostate: Secondary | ICD-10-CM | POA: Diagnosis not present

## 2016-12-01 DIAGNOSIS — Z0000 Encounter for general adult medical examination without abnormal findings: Secondary | ICD-10-CM | POA: Diagnosis not present

## 2016-12-01 DIAGNOSIS — E119 Type 2 diabetes mellitus without complications: Secondary | ICD-10-CM | POA: Diagnosis not present

## 2016-12-01 MED FILL — JANUVIA 50 MG TABLET: 50 | 30 days supply | Qty: 30 | Fill #0

## 2016-12-22 MED FILL — GABAPENTIN 300 MG CAPSULE: 300 | 90 days supply | Qty: 90 | Fill #1

## 2016-12-22 MED FILL — ENTRESTO 49 MG-51 MG TABLET: 49-51 | 90 days supply | Qty: 180 | Fill #3

## 2016-12-22 MED FILL — ATORVASTATIN 80 MG TABLET: 80 | 90 days supply | Qty: 90 | Fill #3

## 2016-12-22 MED FILL — FUROSEMIDE 20 MG TABS: 20 | 90 days supply | Qty: 270 | Fill #3

## 2016-12-22 MED FILL — SPIRONOLACTONE 25 MG TABLET: 25 | 90 days supply | Qty: 90 | Fill #3

## 2016-12-22 MED FILL — CARVEDILOL 12.5 MG TABLET: 12.5 | 90 days supply | Qty: 180 | Fill #3

## 2016-12-22 MED FILL — ELIQUIS 5 MG TABLET: 5 | 90 days supply | Qty: 180 | Fill #3

## 2017-01-01 MED FILL — JANUVIA 50 MG TABLET: 50 | 30 days supply | Qty: 30 | Fill #1

## 2017-01-01 MED FILL — UNIFINE PENTIPS 31GX3/16: 31G X 5 MM | 25 days supply | Qty: 100 | Fill #0

## 2017-01-01 MED FILL — UNIFINE PENTIPS 31GX3/16": 31G X 5 MM | 25 days supply | Qty: 100 | Fill #0

## 2017-01-01 MED FILL — LEVEMIR FLEXTOUCH 100 UNITS: 100 | 75 days supply | Qty: 15 | Fill #0

## 2017-01-12 ENCOUNTER — Ambulatory Visit (HOSPITAL_COMMUNITY)
Admission: RE | Admit: 2017-01-12 | Discharge: 2017-01-12 | Disposition: A | Discharge status: Home / Self Care | Payer: PPO | Source: Ambulatory Visit | Attending: Cardiology | Admitting: Cardiology

## 2017-01-12 VITALS — BP 86/50 | HR 73 | Wt 245.5 lb

## 2017-01-12 DIAGNOSIS — I255 Ischemic cardiomyopathy: Secondary | ICD-10-CM | POA: Insufficient documentation

## 2017-01-12 DIAGNOSIS — I2510 Atherosclerotic heart disease of native coronary artery without angina pectoris: Secondary | ICD-10-CM | POA: Diagnosis not present

## 2017-01-12 DIAGNOSIS — I5022 Chronic systolic (congestive) heart failure: Secondary | ICD-10-CM | POA: Diagnosis not present

## 2017-01-12 DIAGNOSIS — Z95810 Presence of automatic (implantable) cardiac defibrillator: Secondary | ICD-10-CM | POA: Insufficient documentation

## 2017-01-12 DIAGNOSIS — Z87891 Personal history of nicotine dependence: Secondary | ICD-10-CM | POA: Diagnosis not present

## 2017-01-12 DIAGNOSIS — E1122 Type 2 diabetes mellitus with diabetic chronic kidney disease: Secondary | ICD-10-CM | POA: Insufficient documentation

## 2017-01-12 DIAGNOSIS — Z79899 Other long term (current) drug therapy: Secondary | ICD-10-CM | POA: Diagnosis not present

## 2017-01-12 DIAGNOSIS — E785 Hyperlipidemia, unspecified: Secondary | ICD-10-CM | POA: Insufficient documentation

## 2017-01-12 DIAGNOSIS — Z951 Presence of aortocoronary bypass graft: Secondary | ICD-10-CM | POA: Diagnosis not present

## 2017-01-12 DIAGNOSIS — Z794 Long term (current) use of insulin: Secondary | ICD-10-CM | POA: Insufficient documentation

## 2017-01-12 DIAGNOSIS — I513 Intracardiac thrombosis, not elsewhere classified: Secondary | ICD-10-CM

## 2017-01-12 DIAGNOSIS — Z7901 Long term (current) use of anticoagulants: Secondary | ICD-10-CM | POA: Diagnosis not present

## 2017-01-12 DIAGNOSIS — N183 Chronic kidney disease, stage 3 (moderate): Secondary | ICD-10-CM | POA: Diagnosis not present

## 2017-01-12 DIAGNOSIS — I4901 Ventricular fibrillation: Secondary | ICD-10-CM | POA: Diagnosis not present

## 2017-01-12 LAB — BASIC METABOLIC PANEL
ANION GAP: 8 (ref 5–15)
BUN: 35 mg/dL — ABNORMAL HIGH (ref 6–20)
CHLORIDE: 106 mmol/L (ref 101–111)
CO2: 23 mmol/L (ref 22–32)
Calcium: 8.9 mg/dL (ref 8.9–10.3)
Creatinine, Ser: 1.99 mg/dL — ABNORMAL HIGH (ref 0.61–1.24)
GFR calc non Af Amer: 33 mL/min — ABNORMAL LOW (ref >60)
GFR, EST AFRICAN AMERICAN: 38 mL/min — AB (ref >60)
Glucose, Bld: 174 mg/dL — ABNORMAL HIGH (ref 65–99)
Potassium: 4.9 mmol/L (ref 3.5–5.1)
Sodium: 137 mmol/L (ref 135–145)

## 2017-01-12 NOTE — Patient Instructions (Addendum)
Routine lab work today. Will notify you of abnormal results, otherwise no news is good news!  EKG today.  Wear compression stockings daily. Remove for sleeping and bathing. Available at any medical supply store (take your prescription paper with you). [facility] (closes medical store from here): Address: [address], [place], [place] [address]  Phone: [phone]   Return later this week for BP check.  ________________________________________________________ [facility] Code: 8002  Follow up 2 months with Dr. Shirlee LatchMcLean.  ________________________________________________________ [facility] Code: 9001  Take all medication as prescribed the day of your appointment. Bring all medications with you to your appointment.  Do the following things EVERYDAY: 1) Weigh yourself in the morning before breakfast. Write it down and keep it in a log. 2) Take your medicines as prescribed 3) Eat low salt foods-Limit salt (sodium) to 2000 mg per day.  4) Stay as active as you can everyday 5) Limit all fluids for the day to less than 2 liters
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
## 2017-01-14 ENCOUNTER — Encounter (HOSPITAL_COMMUNITY): Payer: PPO

## 2017-01-14 ENCOUNTER — Other Ambulatory Visit (HOSPITAL_COMMUNITY): Payer: Self-pay | Admitting: *Deleted

## 2017-01-14 DIAGNOSIS — I5022 Chronic systolic (congestive) heart failure: Secondary | ICD-10-CM

## 2017-01-21 ENCOUNTER — Ambulatory Visit (HOSPITAL_COMMUNITY)
Admission: RE | Admit: 2017-01-21 | Discharge: 2017-01-21 | Disposition: A | Discharge status: Home / Self Care | Payer: PPO | Source: Ambulatory Visit | Attending: Internal Medicine | Admitting: Internal Medicine

## 2017-01-21 DIAGNOSIS — I5022 Chronic systolic (congestive) heart failure: Secondary | ICD-10-CM | POA: Diagnosis not present

## 2017-01-21 LAB — BASIC METABOLIC PANEL
Anion gap: 7 (ref 5–15)
BUN: 30 mg/dL — ABNORMAL HIGH (ref 6–20)
CHLORIDE: 108 mmol/L (ref 101–111)
CO2: 24 mmol/L (ref 22–32)
CREATININE: 1.69 mg/dL — AB (ref 0.61–1.24)
Calcium: 8.8 mg/dL — ABNORMAL LOW (ref 8.9–10.3)
GFR calc non Af Amer: 40 mL/min — ABNORMAL LOW (ref >60)
GFR, EST AFRICAN AMERICAN: 46 mL/min — AB (ref >60)
GLUCOSE: 128 mg/dL — AB (ref 65–99)
Potassium: 4.2 mmol/L (ref 3.5–5.1)
Sodium: 139 mmol/L (ref 135–145)

## 2017-01-25 ENCOUNTER — Ambulatory Visit (INDEPENDENT_AMBULATORY_CARE_PROVIDER_SITE_OTHER): Payer: PPO | Admitting: *Deleted

## 2017-01-25 DIAGNOSIS — I5022 Chronic systolic (congestive) heart failure: Secondary | ICD-10-CM

## 2017-01-25 NOTE — Progress Notes (Signed)
Remote ICD transmission.   

## 2017-01-28 LAB — CUP PACEART REMOTE DEVICE CHECK
Battery Remaining Longevity: 89 mo
Battery Remaining Percentage: 85 %
Date Time Interrogation Session: 20181231070020
HIGH POWER IMPEDANCE MEASURED VALUE: 64 Ohm
HIGH POWER IMPEDANCE MEASURED VALUE: 64 Ohm
Implantable Lead Model: 7122
Implantable Pulse Generator Implant Date: 20170608
Lead Channel Impedance Value: 430 Ohm
Lead Channel Pacing Threshold Pulse Width: 0.5 ms
Lead Channel Sensing Intrinsic Amplitude: 11.9 mV
Lead Channel Setting Pacing Amplitude: 2.5 V
Lead Channel Setting Pacing Pulse Width: 0.5 ms
MDC IDC LEAD IMPLANT DT: 20170608
MDC IDC LEAD LOCATION: 753860
MDC IDC MSMT BATTERY VOLTAGE: 3.04 V
MDC IDC MSMT LEADCHNL RV PACING THRESHOLD AMPLITUDE: 0.75 V
MDC IDC PG SERIAL: 7359513
MDC IDC SET LEADCHNL RV SENSING SENSITIVITY: 0.5 mV
MDC IDC STAT BRADY RV PERCENT PACED: 1 %

## 2017-01-29 ENCOUNTER — Encounter: Payer: Self-pay | Admitting: Cardiology

## 2017-02-24 DIAGNOSIS — I10 Essential (primary) hypertension: Secondary | ICD-10-CM | POA: Diagnosis not present

## 2017-02-24 DIAGNOSIS — E119 Type 2 diabetes mellitus without complications: Secondary | ICD-10-CM | POA: Diagnosis not present

## 2017-02-24 DIAGNOSIS — Z0000 Encounter for general adult medical examination without abnormal findings: Secondary | ICD-10-CM | POA: Diagnosis not present

## 2017-02-24 DIAGNOSIS — E785 Hyperlipidemia, unspecified: Secondary | ICD-10-CM | POA: Diagnosis not present

## 2017-02-26 MED FILL — UNIFINE PENTIPS 31GX3/16: 31G X 5 MM | 75 days supply | Qty: 300 | Fill #0

## 2017-02-26 MED FILL — UNIFINE PENTIPS 31GX3/16": 31G X 5 MM | 75 days supply | Qty: 300 | Fill #0

## 2017-02-26 MED FILL — NOVOLOG FLEXPEN SYRINGE: 100 | 90 days supply | Qty: 27 | Fill #2

## 2017-03-03 DIAGNOSIS — E785 Hyperlipidemia, unspecified: Secondary | ICD-10-CM | POA: Diagnosis not present

## 2017-03-03 DIAGNOSIS — E119 Type 2 diabetes mellitus without complications: Secondary | ICD-10-CM | POA: Diagnosis not present

## 2017-03-03 DIAGNOSIS — I10 Essential (primary) hypertension: Secondary | ICD-10-CM | POA: Diagnosis not present

## 2017-03-10 ENCOUNTER — Other Ambulatory Visit (HOSPITAL_COMMUNITY): Payer: Self-pay | Admitting: Cardiology

## 2017-03-12 MED FILL — ENTRESTO 49 MG-51 MG TABLET: 49-51 | 90 days supply | Qty: 180 | Fill #0

## 2017-03-12 MED FILL — ELIQUIS 5 MG TABLET: 5 | 90 days supply | Qty: 180 | Fill #0

## 2017-03-12 MED FILL — ATORVASTATIN 80 MG TABLET: 80 | 90 days supply | Qty: 90 | Fill #0

## 2017-03-12 MED FILL — SPIRONOLACTONE 25 MG TABLET: 25 | 90 days supply | Qty: 90 | Fill #0

## 2017-03-12 MED FILL — CARVEDILOL 12.5 MG TABLET: 12.5 | 90 days supply | Qty: 180 | Fill #0

## 2017-03-12 MED FILL — FUROSEMIDE 20 MG TABS: 20 | 90 days supply | Qty: 270 | Fill #0

## 2017-03-15 MED FILL — JANUVIA 50 MG TABLET: 50 | 90 days supply | Qty: 90 | Fill #0

## 2017-03-16 ENCOUNTER — Ambulatory Visit (HOSPITAL_COMMUNITY)
Admission: RE | Admit: 2017-03-16 | Discharge: 2017-03-16 | Disposition: A | Discharge status: Home / Self Care | Payer: PPO | Source: Ambulatory Visit | Attending: Cardiology | Admitting: Cardiology

## 2017-03-16 VITALS — BP 108/64 | HR 63 | Wt 243.4 lb

## 2017-03-16 DIAGNOSIS — Z951 Presence of aortocoronary bypass graft: Secondary | ICD-10-CM | POA: Diagnosis not present

## 2017-03-16 DIAGNOSIS — E1122 Type 2 diabetes mellitus with diabetic chronic kidney disease: Secondary | ICD-10-CM | POA: Insufficient documentation

## 2017-03-16 DIAGNOSIS — E785 Hyperlipidemia, unspecified: Secondary | ICD-10-CM | POA: Insufficient documentation

## 2017-03-16 DIAGNOSIS — I255 Ischemic cardiomyopathy: Secondary | ICD-10-CM | POA: Insufficient documentation

## 2017-03-16 DIAGNOSIS — Z794 Long term (current) use of insulin: Secondary | ICD-10-CM | POA: Insufficient documentation

## 2017-03-16 DIAGNOSIS — Z7901 Long term (current) use of anticoagulants: Secondary | ICD-10-CM | POA: Insufficient documentation

## 2017-03-16 DIAGNOSIS — Z79899 Other long term (current) drug therapy: Secondary | ICD-10-CM | POA: Diagnosis not present

## 2017-03-16 DIAGNOSIS — N183 Chronic kidney disease, stage 3 (moderate): Secondary | ICD-10-CM | POA: Insufficient documentation

## 2017-03-16 DIAGNOSIS — I2510 Atherosclerotic heart disease of native coronary artery without angina pectoris: Secondary | ICD-10-CM | POA: Insufficient documentation

## 2017-03-16 DIAGNOSIS — I4901 Ventricular fibrillation: Secondary | ICD-10-CM

## 2017-03-16 DIAGNOSIS — Z87891 Personal history of nicotine dependence: Secondary | ICD-10-CM | POA: Insufficient documentation

## 2017-03-16 DIAGNOSIS — I5022 Chronic systolic (congestive) heart failure: Secondary | ICD-10-CM | POA: Diagnosis not present

## 2017-03-16 LAB — BASIC METABOLIC PANEL
Anion gap: 9 (ref 5–15)
BUN: 17 mg/dL (ref 6–20)
CHLORIDE: 107 mmol/L (ref 101–111)
CO2: 23 mmol/L (ref 22–32)
Calcium: 8.9 mg/dL (ref 8.9–10.3)
Creatinine, Ser: 1.41 mg/dL — ABNORMAL HIGH (ref 0.61–1.24)
GFR calc Af Amer: 58 mL/min — ABNORMAL LOW (ref >60)
GFR calc non Af Amer: 50 mL/min — ABNORMAL LOW (ref >60)
GLUCOSE: 200 mg/dL — AB (ref 65–99)
POTASSIUM: 4.3 mmol/L (ref 3.5–5.1)
Sodium: 139 mmol/L (ref 135–145)

## 2017-03-16 MED FILL — GABAPENTIN 300 MG CAPSULE: 300 | 90 days supply | Qty: 90 | Fill #2

## 2017-03-16 NOTE — Progress Notes (Signed)
CSW referred to assist patient with resources. Patient reports he was previously on an "assistance program" for his Medicare premiums but "it was cut off". Patient also shared he only receives [place] monthly from [place]. 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 CS[facility] and discussion on Extra Help program. Also provided number for follow up with Social Security regarding monthly income and possible SSI. Patient verbalizes understanding and will follow up and return call to CSW if needed. Lasandra BeechJackie Morgana Rowley, LCSW, CCSW-MCS [phone]

## 2017-03-16 NOTE — Patient Instructions (Signed)
Labs drawn today (if we do not call you, then your lab work was stable)   Your physician recommends that you schedule a follow-up appointment in: 3 months with Dr. McLean    

## 2017-03-17 NOTE — Progress Notes (Signed)
Patient ID: Bradley Benitez, male   DOB: 11-24-1950, 67 y.o.   MRN: [id number]    Advanced Heart Failure Clinic Note   PCP: Dr Selena BattenKim Cardiologist: Dr Shirlee LatchMcLean   HPI: Mr Bradley Benitez is a 67 y.o. 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   [facility] with chest pain. LHC showed 3VD and he underwent CABG. Hospital course was complicated by V fib on 02/05/15 required 9 defibrillations. Loaded on amio. He was not placed on bb due to low output. Placed on eliquis for LV thrombus due to concerns about compliance with coumadin. Discharge weight 208 pounds.   Repeat echo in 5/17 with EF 30-35%.  He had St Jude ICD placed in 6/17.  Echo 9/18 with EF improved to 45%.   He presents today for followup of CHF.  He is generally doing ok.  No chest pain.  Dyspnea only with walking up a hill.  No PND or orthopnea.  He is taking Lasix 40 mg bid currently.  No palpitations, lightheadedness, or syncope.    ECG: NSR, left axis deviation, low voltage, old anterior MI, PACs.   Labs (2/18): K 4.4, creatinine 1.37 Labs (1/17): K 4.1, creatinine 1.14 Labs (2/17): LFTs normal, TSH normal Labs (04/01/2015): K 4.5 Creatinine 1.32. Dig level 1.7  Labs (5/17): K 4.5, creatinine 1.6 Labs (6/17): K 4.3, creatinine 1.49, LDL 54, HDL 33 Labs (08/2015): K 4.0 Creatinine 1.56 Labs (12/17): K 4.3, creatinine 1.28 Labs (2/18): K 4.4, creatinine 1.37, BNP 195 Labs (10/18): K 4.6, creatinine 1.44, LDL 52, HDL 26, hgb 13.8 Labs (12/18): K 4.2, creatinine 1.69  LHC/RHC (12/16): RA mean 11 PA 35/11 PCWP mean 23 CI 2.59   Ost RPDA lesion, 70% stenosed.  1st RPLB lesion, 80% stenosed.  Mid Cx lesion, 80% stenosed.  Prox LAD lesion, 60% stenosed.  Mid LAD lesion, 95% stenosed.  Ost 2nd Diag lesion, 70% stenosed.  Ost Ramus to Ramus lesion, 90% stenosed  Cardiac MRI (1/17): 1. Severe LV systolic dysfunction, EF 26%. Wall motion  abnormalities as above. 2. Normal RV size and systolic function . 3. LGE pattern suggestive of infarction in LAD territory. The mid anterior/anteroseptal and apical anterior/septal/inferior wall segments and the true apex do not appear viable (probably not likely to improve function with revascularization). The remainder of the left ventricle appears viable. 4. There is a small LV apical thrombus.  ECHO (1/17): EF 20-25% with apical thombus.  ECHO (5/17): EF 30-35%, no mention of apical thrombus, moderate MR Peripheral arterial dopplers (6/17): normal study.  Echo (9/18): EF 45%, mid-apical anteroseptal akinesis, normal RV size and systolic function.   Review of systems complete and found to be negative unless listed in HPI.    SH:  Social History   Socioeconomic History  . Marital status: Single    Spouse name: Not on file  . Number of children: Not on file  . Years of education: Not on file  . Highest education level: Not on file  Social Needs  . Financial resource strain: Not on file  . Food insecurity - worry: Not on file  . Food insecurity - inability: Not on file  . Transportation needs - medical: Not on file  . Transportation needs - non-medical: Not on file  Occupational History  . Not on file  Tobacco Use  . Smoking status: Former Smoker    Packs/day: 3.00  Years: 20.00    Pack years: 60.00    Types: Cigarettes    Last attempt to quit: 01/26/1994    Years since quitting: 23.1  . Smokeless tobacco: Never Used  Substance and Sexual Activity  . Alcohol use: No    Alcohol/week: 0.0 oz  . Drug use: Yes    Comment: Previously smoked marijuana, none in 7 years  . Sexual activity: Not on file  Other Topics Concern  . Not on file  Social History Narrative  . Not on file    FH:  Family History  Problem Relation Age of Onset  . Diabetes Mother   . Diabetes Father   . Heart failure Brother     Past Medical History:  Diagnosis Date  . DM2 (diabetes  mellitus, type 2) (HCC)   . Gangrene (HCC)    10+ years ago    Current Outpatient Medications  Medication Sig Dispense Refill  . atorvastatin (LIPITOR) 80 MG tablet TAKE 1 TABLET BY MOUTH DAILY. 90 tablet 3  . carvedilol (COREG) 12.5 MG tablet TAKE 1 TABLET BY MOUTH 2 TIMES DAILY WITH A MEAL. 180 tablet 3  . ELIQUIS 5 MG TABS tablet TAKE 1 TABLET BY MOUTH 2 TIMES DAILY. 180 tablet 3  . ENTRESTO 49-51 MG TAKE 1 TABLET BY MOUTH 2 (TWO) TIMES DAILY. 180 tablet 3  . furosemide (LASIX) 20 MG tablet TAKE 2 TABLETS BY MOUTH IN THE MORNING AND 1 TABLET IN THE AFTERNOON (Patient taking differently: Takes 2 Tablets twice daily) 270 tablet 3  . gabapentin (NEURONTIN) 300 MG capsule Take 1 capsule (300 mg total) by mouth at bedtime. 90 capsule 3  . insulin aspart (NOVOLOG) 100 UNIT/ML injection Inject 5-10 Units into the skin 3 (three) times daily before meals. Per sliding scale:    . insulin detemir (LEVEMIR) 100 UNIT/ML injection Inject 0.2 mLs (20 Units total) into the skin daily. 10 mL 11  . Menthol, Topical Analgesic, (BIOFREEZE EX) Apply 1 application topically at bedtime.    Marland Kitchen OVER THE COUNTER MEDICATION Place 1 drop into both eyes daily as needed (dry eyes). Over the counter lubricating eye drops from [employer]    . sitaGLIPtin (JANUVIA) 50 MG tablet Take 50 mg by mouth daily.    Marland Kitchen spironolactone (ALDACTONE) 25 MG tablet TAKE 1 TABLET BY MOUTH DAILY. 90 tablet 3   No current facility-administered medications for this encounter.     Vitals:   03/16/17 1357  BP: 108/64  Pulse: 63  SpO2: 98%  Weight: 243 lb 6.4 oz (110.4 kg)   Wt Readings from Last 3 Encounters:  03/16/17 243 lb 6.4 oz (110.4 kg)  01/12/17 245 lb 8 oz (111.4 kg)  10/13/16 245 lb 12 oz (111.5 kg)   PHYSICAL EXAM: General: NAD Neck: No JVD, no thyromegaly or thyroid nodule.  Lungs: Clear to auscultation bilaterally with normal respiratory effort. CV: Nondisplaced PMI.  Heart regular S1/S2, no S3/S4, no murmur.  1+  ankle edema.  No carotid bruit.  Normal pedal pulses.  Abdomen: Soft, nontender, no hepatosplenomegaly, no distention.  Skin: Intact without lesions or rashes.  Neurologic: Alert and oriented x 3.  Psych: Normal affect. Extremities: No clubbing or cyanosis.  HEENT: Normal.   ASSESSMENT & PLAN: 1. 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 S/p Medtronic ICD.  No longer on Amiodarone.   2. Chronic systolic CHF: EF 16% with regional WMAs by cardiac MRI, ischemic cardiomyopathy. S/P CABG x 4.Repeat echo in 5/17 with EF of 30-35%.  Echo in 9/18 with EF up to 45%. NYHA class II symptoms.  He is not volume overloaded on exam.   - Continue Lasix 40 mg bid, BMET today.  - Continue Coreg 12.5 mg BID.  - Continue Entresto 49/51mg  BID.  - Continue spironolactone 25 mg daily.  3. CAD: Diffuse 3 vessel disease, see cath report above. cMRI showed: LAD territory did not appear to have significant viability. Remainder of LV myocardium did appear viable. He had severe disease in LCx and RCA territory. s/p CABG x 4 in 1/17. Suspect ventricular fibrillation arrest 02/05/15 was scar-mediated as opposed to occlusion of graft, etc.  No chest pain.  - No ASA with apixaban.  - Continue statin.   4. LV mural thrombus: Seen on 1/17 cardiac MRI.  Not reported on most recent echo in 5/17.  - Continue apixaban 5 mg BID. Denies bleeding. 5. Hyperlipidemia: Good lipids in 10/18.  Continue statin.  6. CKD: Stage III - BMET today.   7. DM2: He is on sitagliptin.  Given CHF, would consider another agent.  If creatinine does not increase further, empagliflozin would be a good option.   Followup in 3 months.  [name] 03/17/2017

## 2017-04-26 ENCOUNTER — Ambulatory Visit (INDEPENDENT_AMBULATORY_CARE_PROVIDER_SITE_OTHER): Payer: PPO | Admitting: *Deleted

## 2017-04-26 DIAGNOSIS — I255 Ischemic cardiomyopathy: Secondary | ICD-10-CM | POA: Diagnosis not present

## 2017-04-26 NOTE — Progress Notes (Signed)
Remote ICD transmission.   

## 2017-04-27 ENCOUNTER — Encounter: Payer: Self-pay | Admitting: Cardiology

## 2017-05-11 LAB — CUP PACEART REMOTE DEVICE CHECK
Battery Remaining Longevity: 88 mo
Battery Remaining Percentage: 84 %
HIGH POWER IMPEDANCE MEASURED VALUE: 65 Ohm
HIGH POWER IMPEDANCE MEASURED VALUE: 65 Ohm
Implantable Lead Implant Date: 20170608
Implantable Lead Model: 7122
Implantable Pulse Generator Implant Date: 20170608
Lead Channel Impedance Value: 410 Ohm
Lead Channel Pacing Threshold Amplitude: 0.75 V
Lead Channel Setting Pacing Pulse Width: 0.5 ms
MDC IDC LEAD LOCATION: 753860
MDC IDC MSMT BATTERY VOLTAGE: 3.01 V
MDC IDC MSMT LEADCHNL RV PACING THRESHOLD PULSEWIDTH: 0.5 ms
MDC IDC MSMT LEADCHNL RV SENSING INTR AMPL: 11.9 mV
MDC IDC PG SERIAL: 7359513
MDC IDC SESS DTM: 20190401060016
MDC IDC SET LEADCHNL RV PACING AMPLITUDE: 2.5 V
MDC IDC SET LEADCHNL RV SENSING SENSITIVITY: 0.5 mV
MDC IDC STAT BRADY RV PERCENT PACED: 1 %

## 2017-05-31 DIAGNOSIS — E119 Type 2 diabetes mellitus without complications: Secondary | ICD-10-CM | POA: Diagnosis not present

## 2017-05-31 DIAGNOSIS — E785 Hyperlipidemia, unspecified: Secondary | ICD-10-CM | POA: Diagnosis not present

## 2017-05-31 DIAGNOSIS — I10 Essential (primary) hypertension: Secondary | ICD-10-CM | POA: Diagnosis not present

## 2017-06-07 ENCOUNTER — Other Ambulatory Visit (HOSPITAL_COMMUNITY): Payer: Self-pay | Admitting: Cardiology

## 2017-06-07 DIAGNOSIS — N189 Chronic kidney disease, unspecified: Secondary | ICD-10-CM | POA: Diagnosis not present

## 2017-06-07 DIAGNOSIS — I509 Heart failure, unspecified: Secondary | ICD-10-CM | POA: Diagnosis not present

## 2017-06-07 DIAGNOSIS — I10 Essential (primary) hypertension: Secondary | ICD-10-CM | POA: Diagnosis not present

## 2017-06-07 DIAGNOSIS — Z6833 Body mass index (BMI) 33.0-33.9, adult: Secondary | ICD-10-CM | POA: Diagnosis not present

## 2017-06-07 DIAGNOSIS — Z79899 Other long term (current) drug therapy: Secondary | ICD-10-CM | POA: Diagnosis not present

## 2017-06-07 MED FILL — ENTRESTO 49 MG-51 MG TABLET: 49-51 | 90 days supply | Qty: 180 | Fill #1

## 2017-06-07 MED FILL — FUROSEMIDE 20 MG TABS: 20 | 90 days supply | Qty: 270 | Fill #1

## 2017-06-07 MED FILL — ATORVASTATIN 80 MG TABLET: 80 | 90 days supply | Qty: 90 | Fill #1

## 2017-06-07 MED FILL — CARVEDILOL 12.5 MG TABLET: 12.5 | 90 days supply | Qty: 180 | Fill #1

## 2017-06-07 MED FILL — SPIRONOLACTONE 25 MG TABLET: 25 | 90 days supply | Qty: 90 | Fill #1

## 2017-06-07 MED FILL — JANUVIA 50 MG TABLET: 50 | 90 days supply | Qty: 90 | Fill #1

## 2017-06-07 MED FILL — ELIQUIS 5 MG TABLET: 5 | 90 days supply | Qty: 180 | Fill #1

## 2017-06-14 MED FILL — UNIFINE PENTIPS 31GX3/16: 31G X 5 MM | 75 days supply | Qty: 300 | Fill #1

## 2017-06-14 MED FILL — GABAPENTIN 300 MG CAPSULE: 300 | 90 days supply | Qty: 90 | Fill #0

## 2017-06-14 MED FILL — UNIFINE PENTIPS 31GX3/16": 31G X 5 MM | 75 days supply | Qty: 300 | Fill #1

## 2017-06-14 MED FILL — NOVOLOG FLEXPEN SYRINGE: 100 | 90 days supply | Qty: 27 | Fill #3

## 2017-06-15 ENCOUNTER — Encounter (HOSPITAL_COMMUNITY): Payer: Self-pay | Admitting: Cardiology

## 2017-06-15 ENCOUNTER — Ambulatory Visit (HOSPITAL_COMMUNITY)
Admission: RE | Admit: 2017-06-15 | Discharge: 2017-06-15 | Disposition: A | Discharge status: Home / Self Care | Payer: PPO | Source: Ambulatory Visit | Attending: Cardiology | Admitting: Cardiology

## 2017-06-15 ENCOUNTER — Other Ambulatory Visit: Payer: Self-pay

## 2017-06-15 VITALS — BP 109/69 | HR 66 | Wt 248.0 lb

## 2017-06-15 DIAGNOSIS — Z794 Long term (current) use of insulin: Secondary | ICD-10-CM | POA: Diagnosis not present

## 2017-06-15 DIAGNOSIS — I255 Ischemic cardiomyopathy: Secondary | ICD-10-CM | POA: Diagnosis not present

## 2017-06-15 DIAGNOSIS — E1122 Type 2 diabetes mellitus with diabetic chronic kidney disease: Secondary | ICD-10-CM | POA: Diagnosis not present

## 2017-06-15 DIAGNOSIS — Z79899 Other long term (current) drug therapy: Secondary | ICD-10-CM | POA: Insufficient documentation

## 2017-06-15 DIAGNOSIS — E785 Hyperlipidemia, unspecified: Secondary | ICD-10-CM | POA: Diagnosis not present

## 2017-06-15 DIAGNOSIS — N183 Chronic kidney disease, stage 3 (moderate): Secondary | ICD-10-CM | POA: Diagnosis not present

## 2017-06-15 DIAGNOSIS — Z87891 Personal history of nicotine dependence: Secondary | ICD-10-CM | POA: Diagnosis not present

## 2017-06-15 DIAGNOSIS — I5022 Chronic systolic (congestive) heart failure: Secondary | ICD-10-CM | POA: Diagnosis not present

## 2017-06-15 DIAGNOSIS — Z8249 Family history of ischemic heart disease and other diseases of the circulatory system: Secondary | ICD-10-CM | POA: Insufficient documentation

## 2017-06-15 DIAGNOSIS — Z7901 Long term (current) use of anticoagulants: Secondary | ICD-10-CM | POA: Insufficient documentation

## 2017-06-15 DIAGNOSIS — Z8674 Personal history of sudden cardiac arrest: Secondary | ICD-10-CM | POA: Diagnosis not present

## 2017-06-15 DIAGNOSIS — I2510 Atherosclerotic heart disease of native coronary artery without angina pectoris: Secondary | ICD-10-CM | POA: Diagnosis not present

## 2017-06-15 DIAGNOSIS — Z951 Presence of aortocoronary bypass graft: Secondary | ICD-10-CM

## 2017-06-15 DIAGNOSIS — Z833 Family history of diabetes mellitus: Secondary | ICD-10-CM | POA: Diagnosis not present

## 2017-06-15 DIAGNOSIS — Z95810 Presence of automatic (implantable) cardiac defibrillator: Secondary | ICD-10-CM | POA: Insufficient documentation

## 2017-06-15 LAB — BASIC METABOLIC PANEL
ANION GAP: 8 (ref 5–15)
BUN: 24 mg/dL — ABNORMAL HIGH (ref 6–20)
CALCIUM: 9.1 mg/dL (ref 8.9–10.3)
CHLORIDE: 105 mmol/L (ref 101–111)
CO2: 26 mmol/L (ref 22–32)
Creatinine, Ser: 1.55 mg/dL — ABNORMAL HIGH (ref 0.61–1.24)
GFR calc non Af Amer: 44 mL/min — ABNORMAL LOW (ref >60)
GFR, EST AFRICAN AMERICAN: 51 mL/min — AB (ref >60)
Glucose, Bld: 130 mg/dL — ABNORMAL HIGH (ref 65–99)
POTASSIUM: 4.3 mmol/L (ref 3.5–5.1)
Sodium: 139 mmol/L (ref 135–145)

## 2017-06-15 NOTE — Patient Instructions (Signed)
Labs drawn today (if we do not call you, then your lab work was stable)   Your physician recommends that you schedule a follow-up appointment in: 3 months with Dr. McLean    

## 2017-06-16 NOTE — Progress Notes (Signed)
Patient ID: [name], male   DOB: May 22, 1950, 67 y.o.   MRN: [id number]    Advanced Heart Failure Clinic Note   PCP: Dr Bradley Benitez Cardiologist: Dr Bradley Benitez   HPI: Bradley Benitez is a 67 y.o. 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   Admitted to [facility] the end of December with chest pain. LHC showed 3VD and he underwent CABG. Hospital course was complicated by V fib on 02/05/15 required 9 defibrillations. Loaded on amio. He was not placed on bb due to low output. Placed on eliquis for LV thrombus due to concerns about compliance with coumadin. Discharge weight 208 pounds.   Repeat echo in 5/17 with EF 30-35%.  He had St Jude ICD placed in 6/17.  Echo 9/18 with EF improved to 45%.   He presents today for followup of CHF.  No dyspnea walking on flat ground. Weight is up about 5 lbs but not getting a lot of exercise. No chest pain. No orthopnea/PND. Tolerating meds without problems.   ECG (personally reviewed): NSR, 1st degree AVB, poor RWP  Labs (2/18): K 4.4, creatinine 1.37 Labs (1/17): K 4.1, creatinine 1.14 Labs (2/17): LFTs normal, TSH normal Labs (04/01/2015): K 4.5 Creatinine 1.32. Dig level 1.7  Labs (5/17): K 4.5, creatinine 1.6 Labs (6/17): K 4.3, creatinine 1.49, LDL 54, HDL 33 Labs (08/2015): K 4.0 Creatinine 1.56 Labs (12/17): K 4.3, creatinine 1.28 Labs (2/18): K 4.4, creatinine 1.37, BNP 195 Labs (10/18): K 4.6, creatinine 1.44, LDL 52, HDL 26, hgb 13.8 Labs (12/18): K 4.2, creatinine 1.69 Labs (5/19): creatinine 2.09, LFTs normal, hgb 14.5, LDL 39, TSH normal  LHC/RHC (12/16): RA mean 11 PA 35/11 PCWP mean 23 CI 2.59   Ost RPDA lesion, 70% stenosed.  1st RPLB lesion, 80% stenosed.  Mid Cx lesion, 80% stenosed.  Prox LAD lesion, 60% stenosed.  Mid LAD lesion, 95% stenosed.  Ost 2nd Diag lesion, 70% stenosed.  Ost Ramus to Ramus lesion, 90% stenosed  Cardiac MRI (1/17): 1. Severe LV systolic dysfunction, EF  26%. Wall motion abnormalities as above. 2. Normal RV size and systolic function . 3. LGE pattern suggestive of infarction in LAD territory. The mid anterior/anteroseptal and apical anterior/septal/inferior wall segments and the true apex do not appear viable (probably not likely to improve function with revascularization). The remainder of the left ventricle appears viable. 4. There is a small LV apical thrombus.  ECHO (1/17): EF 20-25% with apical thombus.  ECHO (5/17): EF 30-35%, no mention of apical thrombus, moderate Bradley Peripheral arterial dopplers (6/17): normal study.  Echo (9/18): EF 45%, mid-apical anteroseptal akinesis, normal RV size and systolic function.   Review of systems complete and found to be negative unless listed in HPI.    SH:  Social History   Socioeconomic History  . Marital status: Single    Spouse name: Not on file  . Number of children: Not on file  . Years of education: Not on file  . Highest education level: Not on file  Occupational History  . Not on file  Social Needs  . Financial resource strain: Not on file  . Food insecurity:    Worry: Not on file    Inability: Not on file  . Transportation needs:    Medical: Not on file    Non-medical: Not on file  Tobacco Use  . Smoking status: Former Smoker    Packs/day: 3.00    Years:  20.00    Pack years: 60.00    Types: Cigarettes    Last attempt to quit: 01/26/1994    Years since quitting: 23.4  . Smokeless tobacco: Never Used  Substance and Sexual Activity  . Alcohol use: No    Alcohol/week: 0.0 oz  . Drug use: Yes    Comment: Previously smoked marijuana, none in 7 years  . Sexual activity: Not on file  Lifestyle  . Physical activity:    Days per week: Not on file    Minutes per session: Not on file  . Stress: Not on file  Relationships  . Social connections:    Talks on phone: Not on file    Gets together: Not on file    Attends religious service: Not on file    Active member of  club or organization: Not on file    Attends meetings of clubs or organizations: Not on file    Relationship status: Not on file  . Intimate partner violence:    Fear of current or ex partner: Not on file    Emotionally abused: Not on file    Physically abused: Not on file    Forced sexual activity: Not on file  Other Topics Concern  . Not on file  Social History Narrative  . Not on file    FH:  Family History  Problem Relation Age of Onset  . Diabetes Mother   . Diabetes Father   . Heart failure Brother     Past Medical History:  Diagnosis Date  . DM2 (diabetes mellitus, type 2) (HCC)   . Gangrene (HCC)    10+ years ago    Current Outpatient Medications  Medication Sig Dispense Refill  . atorvastatin (LIPITOR) 80 MG tablet TAKE 1 TABLET BY MOUTH DAILY. 90 tablet 3  . carvedilol (COREG) 12.5 MG tablet TAKE 1 TABLET BY MOUTH 2 TIMES DAILY WITH A MEAL. 180 tablet 3  . ELIQUIS 5 MG TABS tablet TAKE 1 TABLET BY MOUTH 2 TIMES DAILY. 180 tablet 3  . ENTRESTO 49-51 MG TAKE 1 TABLET BY MOUTH 2 (TWO) TIMES DAILY. 180 tablet 3  . furosemide (LASIX) 20 MG tablet TAKE 2 TABLETS BY MOUTH IN THE MORNING AND 1 TABLET IN THE AFTERNOON (Patient taking differently: Takes 2 Tablets twice daily) 270 tablet 3  . gabapentin (NEURONTIN) 300 MG capsule TAKE 1 CAPSULE BY MOUTH AT BEDTIME. 90 capsule 3  . insulin aspart (NOVOLOG) 100 UNIT/ML injection Inject 5-10 Units into the skin 3 (three) times daily before meals. Per sliding scale:    . insulin detemir (LEVEMIR) 100 UNIT/ML injection Inject 0.2 mLs (20 Units total) into the skin daily. 10 mL 11  . Menthol, Topical Analgesic, (BIOFREEZE EX) Apply 1 application topically at bedtime.    Marland Kitchen OVER THE COUNTER MEDICATION Place 1 drop into both eyes daily as needed (dry eyes). Over the counter lubricating eye drops from [employer]    . sitaGLIPtin (JANUVIA) 50 MG tablet Take 50 mg by mouth daily.    Marland Kitchen spironolactone (ALDACTONE) 25 MG tablet TAKE 1  TABLET BY MOUTH DAILY. 90 tablet 3   No current facility-administered medications for this encounter.     Vitals:   06/15/17 1404  BP: 109/69  Pulse: 66  SpO2: 100%  Weight: 248 lb (112.5 kg)   Wt Readings from Last 3 Encounters:  06/15/17 248 lb (112.5 kg)  03/16/17 243 lb 6.4 oz (110.4 kg)  01/12/17 245 lb 8 oz (111.4 kg)  PHYSICAL EXAM: General: NAD Neck: No JVD, no thyromegaly or thyroid nodule.  Lungs: Clear to auscultation bilaterally with normal respiratory effort. CV: Nondisplaced PMI.  Heart regular S1/S2, no S3/S4, no murmur.  Trace ankle edema.  No carotid bruit.  Normal pedal pulses.  Abdomen: Soft, nontender, no hepatosplenomegaly, no distention.  Skin: Intact without lesions or rashes.  Neurologic: Alert and oriented x 3.  Psych: Normal affect. Extremities: No clubbing or cyanosis.  HEENT: Normal.   ASSESSMENT & PLAN: 1. 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 S/p Medtronic ICD.  No longer on Amiodarone.   2. Chronic systolic CHF: EF 81% with regional WMAs by cardiac MRI, ischemic cardiomyopathy. S/P CABG x 4.Repeat echo in 5/17 with EF of 30-35%.  Echo in 9/18 with EF up to 45%. NYHA class II symptoms.  He is not volume overloaded on exam.   - Continue Lasix 40 mg bid, BMET today.  - Continue Coreg 12.5 mg BID.  - Continue Entresto 49/51mg  BID. I will not increase with creatinine higher recently.  - Continue spironolactone 25 mg daily.  3. CAD: Diffuse 3 vessel disease, see cath report above. cMRI showed: LAD territory did not appear to have significant viability. Remainder of LV myocardium did appear viable. He had severe disease in LCx and RCA territory. s/p CABG x 4 in 1/17. Suspect ventricular fibrillation arrest 02/05/15 was scar-mediated as opposed to occlusion of graft, etc.  No chest pain.  - No ASA with apixaban.  - Continue statin.   4. LV mural thrombus: Seen on 1/17 cardiac MRI.  Not reported on most recent echo in  5/17.  - Continue apixaban 5 mg BID. Denies bleeding. 5. Hyperlipidemia: Good lipids in 5/19.  Continue statin.  6. CKD: Stage III, creatinine recently up to 2.09.  - BMET today.   7. DM2: He is on sitagliptin.  Given CHF, would consider another agent.  If creatinine comes down, empagliflozin would be a good option.   Followup in 3 months.  [name] 06/16/2017

## 2017-06-17 MED FILL — LEVEMIR FLEXTOUCH 100 UNITS: 100 | 90 days supply | Qty: 18 | Fill #0

## 2017-06-29 DIAGNOSIS — I10 Essential (primary) hypertension: Secondary | ICD-10-CM | POA: Diagnosis not present

## 2017-07-01 ENCOUNTER — Ambulatory Visit (INDEPENDENT_AMBULATORY_CARE_PROVIDER_SITE_OTHER): Payer: PPO | Admitting: Internal Medicine

## 2017-07-01 ENCOUNTER — Encounter: Payer: Self-pay | Admitting: Internal Medicine

## 2017-07-01 DIAGNOSIS — I5022 Chronic systolic (congestive) heart failure: Secondary | ICD-10-CM

## 2017-07-01 DIAGNOSIS — I255 Ischemic cardiomyopathy: Secondary | ICD-10-CM | POA: Diagnosis not present

## 2017-07-01 DIAGNOSIS — Z95810 Presence of automatic (implantable) cardiac defibrillator: Secondary | ICD-10-CM

## 2017-07-01 LAB — CUP PACEART INCLINIC DEVICE CHECK
Brady Statistic RV Percent Paced: 1 %
HIGH POWER IMPEDANCE MEASURED VALUE: 58.5 Ohm
Implantable Lead Implant Date: 20170608
Implantable Lead Model: 7122
Implantable Pulse Generator Implant Date: 20170608
Lead Channel Pacing Threshold Amplitude: 0.75 V
Lead Channel Pacing Threshold Amplitude: 0.75 V
Lead Channel Pacing Threshold Pulse Width: 0.5 ms
Lead Channel Setting Pacing Pulse Width: 0.5 ms
MDC IDC LEAD LOCATION: 753860
MDC IDC MSMT BATTERY REMAINING LONGEVITY: 88 mo
MDC IDC MSMT LEADCHNL RV IMPEDANCE VALUE: 400 Ohm
MDC IDC MSMT LEADCHNL RV PACING THRESHOLD PULSEWIDTH: 0.5 ms
MDC IDC MSMT LEADCHNL RV SENSING INTR AMPL: 11.9 mV
MDC IDC PG SERIAL: 7359513
MDC IDC SESS DTM: 20190606144123
MDC IDC SET LEADCHNL RV PACING AMPLITUDE: 2.5 V
MDC IDC SET LEADCHNL RV SENSING SENSITIVITY: 0.5 mV

## 2017-07-01 NOTE — Progress Notes (Signed)
HPI  [name] returns today for ongoing followup, s/p ICD implant. He is a pleasant 67 yo man who sustained an AMI over 2 years ago. He had a stormy course with multiple external defib shocks and bypass surgery. He has persistent LV dysfunction. His EF is 30-35%. He has class 2 CHF symptoms. He is on maximal medical therapy. No syncope. Minimal edema. Since his ICD was placed, he has done well with no chest pain or sob. He has finally found another place to live but had to sell a bunch of his collectibles including 100's of books on paranormal behaviour.   Allergies  Allergen Reactions  . Codeine Other (See Comments)    Pt does not remember reaction     Current Outpatient Medications  Medication Sig Dispense Refill  . atorvastatin (LIPITOR) 80 MG tablet TAKE 1 TABLET BY MOUTH DAILY. 90 tablet 3  . carvedilol (COREG) 12.5 MG tablet TAKE 1 TABLET BY MOUTH 2 TIMES DAILY WITH A MEAL. 180 tablet 3  . ELIQUIS 5 MG TABS tablet TAKE 1 TABLET BY MOUTH 2 TIMES DAILY. 180 tablet 3  . ENTRESTO 49-51 MG TAKE 1 TABLET BY MOUTH 2 (TWO) TIMES DAILY. 180 tablet 3  . furosemide (LASIX) 20 MG tablet TAKE 2 TABLETS BY MOUTH IN THE MORNING AND 1 TABLET IN THE AFTERNOON (Patient taking differently: Takes 2 Tablets twice daily) 270 tablet 3  . gabapentin (NEURONTIN) 300 MG capsule TAKE 1 CAPSULE BY MOUTH AT BEDTIME. 90 capsule 3  . insulin aspart (NOVOLOG) 100 UNIT/ML injection Inject 5-10 Units into the skin 3 (three) times daily before meals. Per sliding scale:    . insulin detemir (LEVEMIR) 100 UNIT/ML injection Inject 0.2 mLs (20 Units total) into the skin daily. 10 mL 11  . Menthol, Topical Analgesic, (BIOFREEZE EX) Apply 1 application topically at bedtime.    Marland Kitchen. OVER THE COUNTER MEDICATION Place 1 drop into both eyes daily as needed (dry eyes). Over the counter lubricating eye drops from [employer]    . sitaGLIPtin (JANUVIA) 50 MG tablet Take 50 mg by mouth daily.    Marland Kitchen. spironolactone (ALDACTONE) 25  MG tablet TAKE 1 TABLET BY MOUTH DAILY. 90 tablet 3   No current facility-administered medications for this visit.      Past Medical History:  Diagnosis Date  . DM2 (diabetes mellitus, type 2) (HCC)   . Gangrene (HCC)    10+ years ago    ROS:   All systems reviewed and negative except as noted in the HPI.   Past Surgical History:  Procedure Laterality Date  . CARDIAC CATHETERIZATION N/A 01/25/2015   Procedure: Right/Left Heart Cath and Coronary Angiography;  Surgeon: Tonny BollmanMichael Cooper, MD;  [facility];  Service: Cardiovascular;  Laterality: N/A;  . CORONARY ARTERY BYPASS GRAFT N/A 02/01/2015   Procedure: CORONARY ARTERY BYPASS GRAFTING (CABG)x 4 using left internal mammary artery and right greater saphenous leg vein using endoscope.;  Surgeon: Kerin PernaPeter Van Trigt, MD;  Location: [facility] OR;  Service: Open Heart Surgery;  Laterality: N/A;  . EP IMPLANTABLE DEVICE N/A 07/04/2015   Procedure: ICD Implant;  Surgeon: Marinus MawGregg W Petro Talent, MD;  [facility];  Service: Cardiovascular;  Laterality: N/A;  . KNEE SURGERY Left   . PLACEMENT OF CENTRIMAG VENTRICULAR ASSIST DEVICE N/A 02/01/2015   Procedure:  CENTRIMAG VENTRICULAR ASSIST DEVICE, back up;  Surgeon: Kerin PernaPeter Van Trigt, MD;  [facility];  Service: Open Heart Surgery;  Laterality:  N/A;  . TEE WITHOUT CARDIOVERSION N/A 02/01/2015   Procedure: TRANSESOPHAGEAL ECHOCARDIOGRAM (TEE);  Surgeon: [name], MD;  Location: [facility] OR;  Service: Open Heart Surgery;  Laterality: N/A;     Family History  Problem Relation Age of Onset  . Diabetes Mother   . Diabetes Father   . Heart failure Brother      Social History   Socioeconomic History  . Marital status: Single    Spouse name: Not on file  . Number of children: Not on file  . Years of education: Not on file  . Highest education level: Not on file  Occupational History  . Not on file  Social Needs  . Financial resource strain: Not on file  . Food insecurity:     Worry: Not on file    Inability: Not on file  . Transportation needs:    Medical: Not on file    Non-medical: Not on file  Tobacco Use  . Smoking status: Former Smoker    Packs/day: 3.00    Years: 20.00    Pack years: 60.00    Types: Cigarettes    Last attempt to quit: 01/26/1994    Years since quitting: 23.4  . Smokeless tobacco: Never Used  Substance and Sexual Activity  . Alcohol use: No    Alcohol/week: 0.0 oz  . Drug use: Yes    Comment: Previously smoked marijuana, none in 7 years  . Sexual activity: Not on file  Lifestyle  . Physical activity:    Days per week: Not on file    Minutes per session: Not on file  . Stress: Not on file  Relationships  . Social connections:    Talks on phone: Not on file    Gets together: Not on file    Attends religious service: Not on file    Active member of club or organization: Not on file    Attends meetings of clubs or organizations: Not on file    Relationship status: Not on file  . Intimate partner violence:    Fear of current or ex partner: Not on file    Emotionally abused: Not on file    Physically abused: Not on file    Forced sexual activity: Not on file  Other Topics Concern  . Not on file  Social History Narrative  . Not on file     BP 122/74   Pulse 64   Ht 6' (1.829 m)   Wt 251 lb (113.9 kg)   BMI 34.04 kg/m   Physical Exam:  Well appearing 67 yo man, diskempt but NAD HEENT: Unremarkable Neck:  6 cm JVD, no thyromegally Lymphatics:  No adenopathy Back:  No CVA tenderness Lungs:  Clear with no wheezes HEART:  Regular rate rhythm, no murmurs, no rubs, no clicks Abd:  soft, positive bowel sounds, no organomegally, no rebound, no guarding Ext:  2 plus pulses, no edema, no cyanosis, no clubbing Skin:  No rashes no nodules Neuro:  CN II through XII intact, motor grossly intact  EKG - none  DEVICE  Normal device function.  See PaceArt for details.   Assess/Plan: 1. Chronic systolic heart failure - his  symptoms are class 2. He will continue his current meds. 2. ICD - he has had no ICD therapies. We will follow. 3. CAD - he denies anginal symptoms. We will follow.  Leonia Reeves.D.

## 2017-07-01 NOTE — Patient Instructions (Signed)

## 2017-07-05 DIAGNOSIS — I10 Essential (primary) hypertension: Secondary | ICD-10-CM | POA: Diagnosis not present

## 2017-07-05 DIAGNOSIS — I509 Heart failure, unspecified: Secondary | ICD-10-CM | POA: Diagnosis not present

## 2017-07-05 DIAGNOSIS — E785 Hyperlipidemia, unspecified: Secondary | ICD-10-CM | POA: Diagnosis not present

## 2017-07-05 DIAGNOSIS — E119 Type 2 diabetes mellitus without complications: Secondary | ICD-10-CM | POA: Diagnosis not present

## 2017-07-26 ENCOUNTER — Ambulatory Visit (INDEPENDENT_AMBULATORY_CARE_PROVIDER_SITE_OTHER): Payer: PPO | Admitting: *Deleted

## 2017-07-26 DIAGNOSIS — I255 Ischemic cardiomyopathy: Secondary | ICD-10-CM | POA: Diagnosis not present

## 2017-07-26 DIAGNOSIS — I5022 Chronic systolic (congestive) heart failure: Secondary | ICD-10-CM

## 2017-07-26 NOTE — Progress Notes (Signed)
Remote ICD transmission.   

## 2017-08-14 LAB — CUP PACEART REMOTE DEVICE CHECK
Battery Remaining Longevity: 84 mo
Battery Remaining Percentage: 82 %
Date Time Interrogation Session: 20190701080331
HIGH POWER IMPEDANCE MEASURED VALUE: 61 Ohm
HighPow Impedance: 61 Ohm
Implantable Lead Implant Date: 20170608
Implantable Lead Location: 753860
Implantable Lead Model: 7122
Implantable Pulse Generator Implant Date: 20170608
Lead Channel Impedance Value: 480 Ohm
Lead Channel Setting Pacing Amplitude: 2.5 V
Lead Channel Setting Sensing Sensitivity: 0.5 mV
MDC IDC MSMT BATTERY VOLTAGE: 2.99 V
MDC IDC MSMT LEADCHNL RV PACING THRESHOLD AMPLITUDE: 0.75 V
MDC IDC MSMT LEADCHNL RV PACING THRESHOLD PULSEWIDTH: 0.5 ms
MDC IDC MSMT LEADCHNL RV SENSING INTR AMPL: 11.9 mV
MDC IDC PG SERIAL: 7359513
MDC IDC SET LEADCHNL RV PACING PULSEWIDTH: 0.5 ms
MDC IDC STAT BRADY RV PERCENT PACED: 3.1 %

## 2017-08-31 MED FILL — NOVOLOG FLEXPEN SYRINGE: 100 | 90 days supply | Qty: 27 | Fill #0

## 2017-08-31 MED FILL — CARVEDILOL 12.5 MG TABLET: 12.5 | 90 days supply | Qty: 180 | Fill #2

## 2017-08-31 MED FILL — SPIRONOLACTONE 25 MG TABLET: 25 | 90 days supply | Qty: 90 | Fill #2

## 2017-08-31 MED FILL — FUROSEMIDE 20 MG TABS: 20 | 90 days supply | Qty: 270 | Fill #2

## 2017-08-31 MED FILL — ELIQUIS 5 MG TABLET: 5 | 90 days supply | Qty: 180 | Fill #2

## 2017-08-31 MED FILL — JANUVIA 50 MG TABLET: 50 | 90 days supply | Qty: 90 | Fill #0

## 2017-08-31 MED FILL — UNIFINE PENTIPS 31GX3/16: 31G X 5 MM | 75 days supply | Qty: 300 | Fill #2

## 2017-08-31 MED FILL — UNIFINE PENTIPS 31GX3/16": 31G X 5 MM | 75 days supply | Qty: 300 | Fill #2

## 2017-08-31 MED FILL — ATORVASTATIN 80 MG TABLET: 80 | 90 days supply | Qty: 90 | Fill #2

## 2017-08-31 MED FILL — ENTRESTO 49 MG-51 MG TABLET: 49-51 | 90 days supply | Qty: 180 | Fill #2

## 2017-08-31 MED FILL — LEVEMIR FLEXTOUCH 100 UNITS: 100 | 90 days supply | Qty: 18 | Fill #1

## 2017-09-10 MED FILL — GABAPENTIN 300 MG CAPSULE: 300 | 90 days supply | Qty: 90 | Fill #1

## 2017-09-24 ENCOUNTER — Other Ambulatory Visit: Payer: Self-pay

## 2017-09-24 ENCOUNTER — Encounter (HOSPITAL_COMMUNITY): Payer: Self-pay | Admitting: Cardiology

## 2017-09-24 ENCOUNTER — Ambulatory Visit (HOSPITAL_COMMUNITY)
Admission: RE | Admit: 2017-09-24 | Discharge: 2017-09-24 | Disposition: A | Discharge status: Home / Self Care | Payer: PPO | Source: Ambulatory Visit | Attending: Cardiology | Admitting: Cardiology

## 2017-09-24 VITALS — BP 111/74 | HR 71 | Wt 248.4 lb

## 2017-09-24 DIAGNOSIS — Z951 Presence of aortocoronary bypass graft: Secondary | ICD-10-CM | POA: Diagnosis not present

## 2017-09-24 DIAGNOSIS — Z95810 Presence of automatic (implantable) cardiac defibrillator: Secondary | ICD-10-CM | POA: Insufficient documentation

## 2017-09-24 DIAGNOSIS — F329 Major depressive disorder, single episode, unspecified: Secondary | ICD-10-CM | POA: Insufficient documentation

## 2017-09-24 DIAGNOSIS — Z87891 Personal history of nicotine dependence: Secondary | ICD-10-CM | POA: Insufficient documentation

## 2017-09-24 DIAGNOSIS — E1122 Type 2 diabetes mellitus with diabetic chronic kidney disease: Secondary | ICD-10-CM | POA: Insufficient documentation

## 2017-09-24 DIAGNOSIS — I255 Ischemic cardiomyopathy: Secondary | ICD-10-CM | POA: Insufficient documentation

## 2017-09-24 DIAGNOSIS — Z7901 Long term (current) use of anticoagulants: Secondary | ICD-10-CM | POA: Diagnosis not present

## 2017-09-24 DIAGNOSIS — Z794 Long term (current) use of insulin: Secondary | ICD-10-CM | POA: Diagnosis not present

## 2017-09-24 DIAGNOSIS — Z79899 Other long term (current) drug therapy: Secondary | ICD-10-CM | POA: Diagnosis not present

## 2017-09-24 DIAGNOSIS — I513 Intracardiac thrombosis, not elsewhere classified: Secondary | ICD-10-CM | POA: Diagnosis not present

## 2017-09-24 DIAGNOSIS — R9431 Abnormal electrocardiogram [ECG] [EKG]: Secondary | ICD-10-CM | POA: Insufficient documentation

## 2017-09-24 DIAGNOSIS — I2510 Atherosclerotic heart disease of native coronary artery without angina pectoris: Secondary | ICD-10-CM | POA: Diagnosis not present

## 2017-09-24 DIAGNOSIS — I5022 Chronic systolic (congestive) heart failure: Secondary | ICD-10-CM | POA: Diagnosis not present

## 2017-09-24 DIAGNOSIS — N183 Chronic kidney disease, stage 3 (moderate): Secondary | ICD-10-CM | POA: Diagnosis not present

## 2017-09-24 DIAGNOSIS — E785 Hyperlipidemia, unspecified: Secondary | ICD-10-CM | POA: Diagnosis not present

## 2017-09-24 DIAGNOSIS — Z833 Family history of diabetes mellitus: Secondary | ICD-10-CM | POA: Insufficient documentation

## 2017-09-24 DIAGNOSIS — I4901 Ventricular fibrillation: Secondary | ICD-10-CM

## 2017-09-24 LAB — BASIC METABOLIC PANEL
ANION GAP: 9 (ref 5–15)
BUN: 26 mg/dL — AB (ref 8–23)
CO2: 25 mmol/L (ref 22–32)
Calcium: 8.9 mg/dL (ref 8.9–10.3)
Chloride: 107 mmol/L (ref 98–111)
Creatinine, Ser: 1.7 mg/dL — ABNORMAL HIGH (ref 0.61–1.24)
GFR calc Af Amer: 46 mL/min — ABNORMAL LOW (ref >60)
GFR, EST NON AFRICAN AMERICAN: 39 mL/min — AB (ref >60)
Glucose, Bld: 173 mg/dL — ABNORMAL HIGH (ref 70–99)
POTASSIUM: 3.9 mmol/L (ref 3.5–5.1)
SODIUM: 141 mmol/L (ref 135–145)

## 2017-09-24 LAB — LIPID PANEL
Cholesterol: 90 mg/dL (ref 0–200)
HDL: 26 mg/dL — ABNORMAL LOW (ref >40)
LDL CALC: 49 mg/dL (ref 0–99)
Total CHOL/HDL Ratio: 3.5 RATIO
Triglycerides: 75 mg/dL (ref <150)
VLDL: 15 mg/dL (ref 0–40)

## 2017-09-24 LAB — CBC
HEMATOCRIT: 38.5 % — AB (ref 39.0–52.0)
Hemoglobin: 13.4 g/dL (ref 13.0–17.0)
MCH: 29.8 pg (ref 26.0–34.0)
MCHC: 34.8 g/dL (ref 30.0–36.0)
MCV: 85.6 fL (ref 78.0–100.0)
Platelets: 209 10*3/uL (ref 150–400)
RBC: 4.5 MIL/uL (ref 4.22–5.81)
RDW: 12.8 % (ref 11.5–15.5)
WBC: 7.8 10*3/uL (ref 4.0–10.5)

## 2017-09-24 NOTE — Patient Instructions (Signed)
Routine lab work today. Will notify you of abnormal results, otherwise no news is good news!  EKG today.  Follow up 3-4 months with Dr. Shirlee LatchMcLean and echocardiogram.  ________________________________________________________________ Vall[place] RidgeGarage Code: 1900  Take all medication as prescribed the day of your appointment. Bring all medications with you to your appointment.  Do the following things EVERYDAY: 1) Weigh yourself in the morning before breakfast. Write it down and keep it in a log. 2) Take your medicines as prescribed 3) Eat low salt foods-Limit salt (sodium) to 2000 mg per day.  4) Stay as active as you can everyday 5) Limit all fluids for the day to less than 2 liters

## 2017-09-24 NOTE — Progress Notes (Signed)
Patient ID: SAVANNAH ERBE, male   DOB: [birth year], 67 y.o.   MRN: [id number]    Advanced Heart Failure Clinic Note   PCP: Dr Selena Batten Cardiologist: Dr Shirlee Latch   HPI: Mr Verba is a 67 y.o. 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   Admitted to [facility] the end of December with chest pain. LHC showed 3VD and he underwent CABG. Hospital course was complicated by V fib on 02/05/15 required 9 defibrillations. Loaded on amio. He was not placed on bb due to low output. Placed on eliquis for LV thrombus due to concerns about compliance with coumadin. Discharge weight 208 pounds.   Repeat echo in 5/17 with EF 30-35%.  He had St Jude ICD placed in 6/17.  Echo 9/18 with EF improved to 45%.   He presents today for followup of CHF.  Seems to be doing well overall.  Gets lonely, not a lot of human interaction.  Not getting out much.  No dyspnea walking on flat ground.  No chest pain.  No PND.  Not getting lightheaded.  Weight stable.   St Jude ICD interrogated: thoracic impedance ok, 2.9% V-paced, no VT  ECG (personally reviewed): NSR with very small P waves, PVCs, LAFB, old inferior MI  Labs (2/18): K 4.4, creatinine 1.37 Labs (1/17): K 4.1, creatinine 1.14 Labs (2/17): LFTs normal, TSH normal Labs (04/01/2015): K 4.5 Creatinine 1.32. Dig level 1.7  Labs (5/17): K 4.5, creatinine 1.6 Labs (6/17): K 4.3, creatinine 1.49, LDL 54, HDL 33 Labs (08/2015): K 4.0 Creatinine 1.56 Labs (12/17): K 4.3, creatinine 1.28 Labs (2/18): K 4.4, creatinine 1.37, BNP 195 Labs (10/18): K 4.6, creatinine 1.44, LDL 52, HDL 26, hgb 13.8 Labs (12/18): K 4.2, creatinine 1.69 Labs (5/19): K 4.3, creatinine 2.09 => 1.55, LFTs normal, hgb 14.5, LDL 39, TSH normal  LHC/RHC (12/16): RA mean 11 PA 35/11 PCWP mean 23 CI 2.59   Ost RPDA lesion, 70% stenosed.  1st RPLB lesion, 80% stenosed.  Mid Cx lesion, 80% stenosed.  Prox LAD lesion, 60% stenosed.  Mid LAD lesion,  95% stenosed.  Ost 2nd Diag lesion, 70% stenosed.  Ost Ramus to Ramus lesion, 90% stenosed  Cardiac MRI (1/17): 1. Severe LV systolic dysfunction, EF 26%. Wall motion abnormalities as above. 2. Normal RV size and systolic function . 3. LGE pattern suggestive of infarction in LAD territory. The mid anterior/anteroseptal and apical anterior/septal/inferior wall segments and the true apex do not appear viable (probably not likely to improve function with revascularization). The remainder of the left ventricle appears viable. 4. There is a small LV apical thrombus.  ECHO (1/17): EF 20-25% with apical thombus.  ECHO (5/17): EF 30-35%, no mention of apical thrombus, moderate MR Peripheral arterial dopplers (6/17): normal study.  Echo (9/18): EF 45%, mid-apical anteroseptal akinesis, normal RV size and systolic function.   Review of systems complete and found to be negative unless listed in HPI.    SH:  Social History   Socioeconomic History  . Marital status: Single    Spouse name: Not on file  . Number of children: Not on file  . Years of education: Not on file  . Highest education level: Not on file  Occupational History  . Not on file  Social Needs  . Financial resource strain: Not on file  . Food insecurity:    Worry: Not on file    Inability: Not on file  .  Transportation needs:    Medical: Not on file    Non-medical: Not on file  Tobacco Use  . Smoking status: Former Smoker    Packs/day: 3.00    Years: 20.00    Pack years: 60.00    Types: Cigarettes    Last attempt to quit: 01/26/1994    Years since quitting: 23.6  . Smokeless tobacco: Never Used  Substance and Sexual Activity  . Alcohol use: No    Alcohol/week: 0.0 standard drinks  . Drug use: Yes    Comment: Previously smoked marijuana, none in 7 years  . Sexual activity: Not on file  Lifestyle  . Physical activity:    Days per week: Not on file    Minutes per session: Not on file  . Stress: Not on  file  Relationships  . Social connections:    Talks on phone: Not on file    Gets together: Not on file    Attends religious service: Not on file    Active member of club or organization: Not on file    Attends meetings of clubs or organizations: Not on file    Relationship status: Not on file  . Intimate partner violence:    Fear of current or ex partner: Not on file    Emotionally abused: Not on file    Physically abused: Not on file    Forced sexual activity: Not on file  Other Topics Concern  . Not on file  Social History Narrative  . Not on file    FH:  Family History  Problem Relation Age of Onset  . Diabetes Mother   . Diabetes Father   . Heart failure Brother     Past Medical History:  Diagnosis Date  . DM2 (diabetes mellitus, type 2) (HCC)   . Gangrene (HCC)    10+ years ago    Current Outpatient Medications  Medication Sig Dispense Refill  . atorvastatin (LIPITOR) 80 MG tablet TAKE 1 TABLET BY MOUTH DAILY. 90 tablet 3  . carvedilol (COREG) 12.5 MG tablet TAKE 1 TABLET BY MOUTH 2 TIMES DAILY WITH A MEAL. 180 tablet 3  . ELIQUIS 5 MG TABS tablet TAKE 1 TABLET BY MOUTH 2 TIMES DAILY. 180 tablet 3  . ENTRESTO 49-51 MG TAKE 1 TABLET BY MOUTH 2 (TWO) TIMES DAILY. 180 tablet 3  . furosemide (LASIX) 20 MG tablet Take 2 tablets in the AM and 1 tablet in the PM    . gabapentin (NEURONTIN) 300 MG capsule TAKE 1 CAPSULE BY MOUTH AT BEDTIME. 90 capsule 3  . insulin aspart (NOVOLOG) 100 UNIT/ML injection Inject 5-10 Units into the skin 3 (three) times daily before meals. Per sliding scale:    . insulin detemir (LEVEMIR) 100 UNIT/ML injection Inject 0.2 mLs (20 Units total) into the skin daily. 10 mL 11  . Menthol, Topical Analgesic, (BIOFREEZE EX) Apply 1 application topically at bedtime.    Marland Kitchen OVER THE COUNTER MEDICATION Place 1 drop into both eyes daily as needed (dry eyes). Over the counter lubricating eye drops from [employer]    . sitaGLIPtin (JANUVIA) 50 MG tablet  Take 50 mg by mouth daily.    Marland Kitchen spironolactone (ALDACTONE) 25 MG tablet TAKE 1 TABLET BY MOUTH DAILY. 90 tablet 3   No current facility-administered medications for this encounter.     Vitals:   09/24/17 1418  BP: 111/74  Pulse: 71  SpO2: 99%  Weight: 112.7 kg (248 lb 6 oz)   Wt Readings  from Last 3 Encounters:  09/24/17 112.7 kg (248 lb 6 oz)  07/01/17 113.9 kg (251 lb)  06/15/17 112.5 kg (248 lb)   PHYSICAL EXAM: General: NAD Neck: No JVD, no thyromegaly or thyroid nodule.  Lungs: Clear to auscultation bilaterally with normal respiratory effort. CV: Nondisplaced PMI.  Heart regular S1/S2, no S3/S4, no murmur.  No peripheral edema.  No carotid bruit.  Normal pedal pulses.  Abdomen: Soft, nontender, no hepatosplenomegaly, no distention.  Skin: Intact without lesions or rashes.  Neurologic: Alert and oriented x 3.  Psych: Normal affect. Extremities: No clubbing or cyanosis.  HEENT: Normal.   ASSESSMENT & PLAN: 1. 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 S/p St Jude ICD.  No longer on Amiodarone.   2. Chronic systolic CHF: EF 30%26% with regional WMAs by cardiac MRI, ischemic cardiomyopathy. S/P CABG x 4.Repeat echo in 5/17 with EF of 30-35%.  Echo in 9/18 with EF up to 45%. NYHA class II symptoms.  He is not volume overloaded on exam.   - Continue Lasix 40 qam/20 qpm, BMET today.  - Continue Coreg 12.5 mg BID.  - Continue Entresto 49/51mg  BID. I will not increase with creatinine higher recently.  - Continue spironolactone 25 mg daily.  - I will arrange for repeat echo.  3. CAD: Diffuse 3 vessel disease, see cath report above. cMRI showed that LAD territory did not appear to have significant viability. Remainder of LV myocardium did appear viable. He had severe disease in LCx and RCA territory. s/p CABG x 4 in 1/17. Suspect ventricular fibrillation arrest 02/05/15 was scar-mediated as opposed to occlusion of graft, etc.  No chest pain.  - No ASA with  apixaban.  - Continue statin.   4. LV mural thrombus: Seen on 1/17 cardiac MRI.  Not reported on most recent echo in 5/17.  - Continue apixaban 5 mg BID. Denies bleeding. 5. Hyperlipidemia: Good lipids in 5/19.  Continue statin.  6. CKD: Stage III, BMET today  7. DM2: He is on sitagliptin.  Given CHF, would consider another agent.  GFR > 30 is reasonable for empagliflozin, would consider transition from sitagliptin to empagliflozin.  8. Depression: Depressed mood, has little interaction with other people.  I will [facility] the [facility] exercise program for CHF patients.    Followup in 3 months.  Marca AnconaDalton Lachlan Pelto 09/27/2017

## 2017-09-29 DIAGNOSIS — E119 Type 2 diabetes mellitus without complications: Secondary | ICD-10-CM | POA: Diagnosis not present

## 2017-09-29 DIAGNOSIS — I10 Essential (primary) hypertension: Secondary | ICD-10-CM | POA: Diagnosis not present

## 2017-09-29 DIAGNOSIS — I509 Heart failure, unspecified: Secondary | ICD-10-CM | POA: Diagnosis not present

## 2017-10-06 DIAGNOSIS — E119 Type 2 diabetes mellitus without complications: Secondary | ICD-10-CM | POA: Diagnosis not present

## 2017-10-06 DIAGNOSIS — I5030 Unspecified diastolic (congestive) heart failure: Secondary | ICD-10-CM | POA: Diagnosis not present

## 2017-10-06 DIAGNOSIS — I10 Essential (primary) hypertension: Secondary | ICD-10-CM | POA: Diagnosis not present

## 2017-10-06 DIAGNOSIS — Z7901 Long term (current) use of anticoagulants: Secondary | ICD-10-CM | POA: Diagnosis not present

## 2017-10-25 ENCOUNTER — Ambulatory Visit (INDEPENDENT_AMBULATORY_CARE_PROVIDER_SITE_OTHER): Payer: PPO | Admitting: *Deleted

## 2017-10-25 DIAGNOSIS — I255 Ischemic cardiomyopathy: Secondary | ICD-10-CM

## 2017-10-26 ENCOUNTER — Other Ambulatory Visit: Payer: Self-pay | Admitting: Cardiology

## 2017-10-26 NOTE — Progress Notes (Signed)
Remote ICD transmission.   

## 2017-11-18 LAB — CUP PACEART REMOTE DEVICE CHECK
Battery Remaining Longevity: 85 mo
Battery Voltage: 2.99 V
Brady Statistic RV Percent Paced: 2.5 %
HighPow Impedance: 59 Ohm
HighPow Impedance: 59 Ohm
Implantable Lead Implant Date: 20170608
Implantable Lead Location: 753860
Lead Channel Impedance Value: 440 Ohm
Lead Channel Pacing Threshold Amplitude: 0.75 V
Lead Channel Pacing Threshold Pulse Width: 0.5 ms
Lead Channel Setting Sensing Sensitivity: 0.5 mV
MDC IDC MSMT BATTERY REMAINING PERCENTAGE: 80 %
MDC IDC MSMT LEADCHNL RV SENSING INTR AMPL: 11.9 mV
MDC IDC PG IMPLANT DT: 20170608
MDC IDC SESS DTM: 20190930063323
MDC IDC SET LEADCHNL RV PACING AMPLITUDE: 2.5 V
MDC IDC SET LEADCHNL RV PACING PULSEWIDTH: 0.5 ms
Pulse Gen Serial Number: 7359513

## 2017-11-30 MED FILL — JANUVIA 50 MG TABLET: 50 | 90 days supply | Qty: 90 | Fill #0

## 2017-12-06 MED FILL — ENTRESTO 49 MG-51 MG TABLET: 49-51 | 90 days supply | Qty: 180 | Fill #3

## 2017-12-06 MED FILL — UNIFINE PENTIPS 31GX3/16": 31G X 5 MM | 75 days supply | Qty: 300 | Fill #3

## 2017-12-06 MED FILL — ELIQUIS 5 MG TABLET: 5 | 90 days supply | Qty: 180 | Fill #3

## 2017-12-06 MED FILL — SPIRONOLACTONE 25 MG TABLET: 25 | 90 days supply | Qty: 90 | Fill #3

## 2017-12-06 MED FILL — UNIFINE PENTIPS 31GX3/16: 31G X 5 MM | 75 days supply | Qty: 300 | Fill #3

## 2017-12-06 MED FILL — FUROSEMIDE 20 MG TABS: 20 | 90 days supply | Qty: 270 | Fill #3

## 2017-12-06 MED FILL — NOVOLOG FLEXPEN SYRINGE: 100 | 90 days supply | Qty: 27 | Fill #1

## 2017-12-06 MED FILL — CARVEDILOL 12.5 MG TABLET: 12.5 | 90 days supply | Qty: 180 | Fill #3

## 2017-12-06 MED FILL — ATORVASTATIN 80 MG TABLET: 80 | 90 days supply | Qty: 90 | Fill #3

## 2017-12-06 MED FILL — LEVEMIR FLEXTOUCH 100 UNITS: 100 | 90 days supply | Qty: 18 | Fill #2

## 2017-12-07 MED FILL — GABAPENTIN 300 MG CAPSULE: 300 | 90 days supply | Qty: 90 | Fill #2

## 2017-12-31 ENCOUNTER — Encounter (HOSPITAL_COMMUNITY): Payer: PPO | Admitting: Cardiology

## 2017-12-31 ENCOUNTER — Ambulatory Visit (HOSPITAL_BASED_OUTPATIENT_CLINIC_OR_DEPARTMENT_OTHER)
Admission: RE | Admit: 2017-12-31 | Discharge: 2017-12-31 | Disposition: A | Discharge status: Home / Self Care | Payer: PPO | Source: Ambulatory Visit | Attending: Cardiology | Admitting: Cardiology

## 2017-12-31 ENCOUNTER — Encounter (HOSPITAL_COMMUNITY): Payer: Self-pay | Admitting: Cardiology

## 2017-12-31 ENCOUNTER — Ambulatory Visit (HOSPITAL_COMMUNITY)
Admission: RE | Admit: 2017-12-31 | Discharge: 2017-12-31 | Disposition: A | Discharge status: Home / Self Care | Payer: PPO | Source: Ambulatory Visit | Attending: Cardiology | Admitting: Cardiology

## 2017-12-31 VITALS — BP 120/74 | HR 66 | Wt 250.6 lb

## 2017-12-31 DIAGNOSIS — Z833 Family history of diabetes mellitus: Secondary | ICD-10-CM | POA: Insufficient documentation

## 2017-12-31 DIAGNOSIS — Z95810 Presence of automatic (implantable) cardiac defibrillator: Secondary | ICD-10-CM | POA: Insufficient documentation

## 2017-12-31 DIAGNOSIS — Z79899 Other long term (current) drug therapy: Secondary | ICD-10-CM | POA: Diagnosis not present

## 2017-12-31 DIAGNOSIS — E1122 Type 2 diabetes mellitus with diabetic chronic kidney disease: Secondary | ICD-10-CM | POA: Diagnosis not present

## 2017-12-31 DIAGNOSIS — E785 Hyperlipidemia, unspecified: Secondary | ICD-10-CM | POA: Insufficient documentation

## 2017-12-31 DIAGNOSIS — Z7901 Long term (current) use of anticoagulants: Secondary | ICD-10-CM | POA: Insufficient documentation

## 2017-12-31 DIAGNOSIS — F329 Major depressive disorder, single episode, unspecified: Secondary | ICD-10-CM | POA: Diagnosis not present

## 2017-12-31 DIAGNOSIS — I5022 Chronic systolic (congestive) heart failure: Secondary | ICD-10-CM

## 2017-12-31 DIAGNOSIS — I2510 Atherosclerotic heart disease of native coronary artery without angina pectoris: Secondary | ICD-10-CM | POA: Diagnosis not present

## 2017-12-31 DIAGNOSIS — E1142 Type 2 diabetes mellitus with diabetic polyneuropathy: Secondary | ICD-10-CM | POA: Diagnosis not present

## 2017-12-31 DIAGNOSIS — Z8674 Personal history of sudden cardiac arrest: Secondary | ICD-10-CM | POA: Insufficient documentation

## 2017-12-31 DIAGNOSIS — Z951 Presence of aortocoronary bypass graft: Secondary | ICD-10-CM | POA: Insufficient documentation

## 2017-12-31 DIAGNOSIS — Z794 Long term (current) use of insulin: Secondary | ICD-10-CM | POA: Insufficient documentation

## 2017-12-31 DIAGNOSIS — Z87891 Personal history of nicotine dependence: Secondary | ICD-10-CM | POA: Insufficient documentation

## 2017-12-31 DIAGNOSIS — I255 Ischemic cardiomyopathy: Secondary | ICD-10-CM | POA: Diagnosis not present

## 2017-12-31 DIAGNOSIS — Z8249 Family history of ischemic heart disease and other diseases of the circulatory system: Secondary | ICD-10-CM | POA: Insufficient documentation

## 2017-12-31 DIAGNOSIS — N183 Chronic kidney disease, stage 3 (moderate): Secondary | ICD-10-CM | POA: Diagnosis not present

## 2017-12-31 LAB — BASIC METABOLIC PANEL
ANION GAP: 9 (ref 5–15)
BUN: 22 mg/dL (ref 8–23)
CALCIUM: 9.3 mg/dL (ref 8.9–10.3)
CO2: 24 mmol/L (ref 22–32)
CREATININE: 1.59 mg/dL — AB (ref 0.61–1.24)
Chloride: 104 mmol/L (ref 98–111)
GFR calc non Af Amer: 44 mL/min — ABNORMAL LOW (ref >60)
GFR, EST AFRICAN AMERICAN: 51 mL/min — AB (ref >60)
Glucose, Bld: 134 mg/dL — ABNORMAL HIGH (ref 70–99)
Potassium: 4.1 mmol/L (ref 3.5–5.1)
SODIUM: 137 mmol/L (ref 135–145)

## 2017-12-31 MED ORDER — PERFLUTREN LIPID MICROSPHERE
1.0000 mL | INTRAVENOUS | Status: AC | PRN
  Administered 2017-12-31: 3 mL via INTRAVENOUS
  Filled 2017-12-31: qty 10

## 2017-12-31 NOTE — Progress Notes (Signed)
  Echocardiogram 2D Echocardiogram has been performed.  Bradley Benitez, [name] 12/31/2017, 3:35 PM

## 2017-12-31 NOTE — Patient Instructions (Signed)
Labs today We will only contact you if something comes back abnormal or we need to make some changes. Otherwise no news is good news!   Call office when your Alma FriendlyJanuvia is running out to call in prescription for Jardiance 10mg  daily.  Your physician recommends that you schedule a follow-up appointment in: 3 month

## 2018-01-02 NOTE — Progress Notes (Signed)
Patient ID: Bradley Benitez, male   DOB: [birth year], 67 y.o.   MRN: [id number]    Advanced Heart Failure Clinic Note   PCP: Dr Selena BattenKim Cardiologist: Dr Shirlee LatchMcLean   HPI: Mr Bradley OremOldham is a 67 y.o. 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   [facility] end of [facility] with chest pain. LHC showed 3VD and he underwent CABG. Hospital course was complicated by V fib on 02/05/15 required 9 defibrillations. Loaded on amio. He was not placed on bb due to low output. Placed on eliquis for LV thrombus due to concerns about compliance with coumadin. Discharge weight 208 pounds.   Repeat echo in 5/17 with EF 30-35%.  He had St Jude ICD placed in 6/17.  Echo 9/18 with EF improved to 45%.   He presents today for followup of CHF.  Stable symptomatically.  Occasional dyspnea with heavy exertion but generally able to walk around without dyspnea.  Main limitation is peripheral neuropathy.  No chest pain.  No orthopnea/PND.  He does not have close family and lives alone, gets lonely.   Labs (2/18): K 4.4, creatinine 1.37 Labs (1/17): K 4.1, creatinine 1.14 Labs (2/17): LFTs normal, TSH normal Labs (04/01/2015): K 4.5 Creatinine 1.32. Dig level 1.7  Labs (5/17): K 4.5, creatinine 1.6 Labs (6/17): K 4.3, creatinine 1.49, LDL 54, HDL 33 Labs (08/2015): K 4.0 Creatinine 1.56 Labs (12/17): K 4.3, creatinine 1.28 Labs (2/18): K 4.4, creatinine 1.37, BNP 195 Labs (10/18): K 4.6, creatinine 1.44, LDL 52, HDL 26, hgb 13.8 Labs (12/18): K 4.2, creatinine 1.69 Labs (5/19): K 4.3, creatinine 2.09 => 1.55, LFTs normal, hgb 14.5, LDL 39, TSH normal Labs (8/19): K 3.9, creatinine 1.7, LDL 49, hgb 13.4  LHC/RHC (12/16): RA mean 11 PA 35/11 PCWP mean 23 CI 2.59   Ost RPDA lesion, 70% stenosed.  1st RPLB lesion, 80% stenosed.  Mid Cx lesion, 80% stenosed.  Prox LAD lesion, 60% stenosed.  Mid LAD lesion, 95% stenosed.  Ost 2nd Diag lesion, 70%  stenosed.  Ost Ramus to Ramus lesion, 90% stenosed  Cardiac MRI (1/17): 1. Severe LV systolic dysfunction, EF 26%. Wall motion abnormalities as above. 2. Normal RV size and systolic function . 3. LGE pattern suggestive of infarction in LAD territory. The mid anterior/anteroseptal and apical anterior/septal/inferior wall segments and the true apex do not appear viable (probably not likely to improve function with revascularization). The remainder of the left ventricle appears viable. 4. There is a small LV apical thrombus.  ECHO (1/17): EF 20-25% with apical thombus.  ECHO (5/17): EF 30-35%, no mention of apical thrombus, moderate MR Peripheral arterial dopplers (6/17): normal study.  Echo (9/18): EF 45%, mid-apical anteroseptal akinesis, normal RV size and systolic function.  Echo (12/19): EF 30-35%, severe anteroseptal, mid-apical anterior, apical inferior, and apical hypokinesis, normal RV size/systolic function.   Review of systems complete and found to be negative unless listed in HPI.    Social History   Socioeconomic History  . Marital status: Single    Spouse name: Not on file  . Number of children: Not on file  . Years of education: Not on file  . Highest education level: Not on file  Occupational History  . Not on file  Social Needs  . Financial resource strain: Not on file  . Food insecurity:    Worry: Not on file    Inability: Not on file  . Transportation  needs:    Medical: Not on file    Non-medical: Not on file  Tobacco Use  . Smoking status: Former Smoker    Packs/day: 3.00    Years: 20.00    Pack years: 60.00    Types: Cigarettes    Last attempt to quit: 01/26/1994    Years since quitting: 23.9  . Smokeless tobacco: Never Used  Substance and Sexual Activity  . Alcohol use: No    Alcohol/week: 0.0 standard drinks  . Drug use: Yes    Comment: Previously smoked marijuana, none in 7 years  . Sexual activity: Not on file  Lifestyle  . Physical  activity:    Days per week: Not on file    Minutes per session: Not on file  . Stress: Not on file  Relationships  . Social connections:    Talks on phone: Not on file    Gets together: Not on file    Attends religious service: Not on file    Active member of club or organization: Not on file    Attends meetings of clubs or organizations: Not on file    Relationship status: Not on file  . Intimate partner violence:    Fear of current or ex partner: Not on file    Emotionally abused: Not on file    Physically abused: Not on file    Forced sexual activity: Not on file  Other Topics Concern  . Not on file  Social History Narrative  . Not on file   Family History  Problem Relation Age of Onset  . Diabetes Mother   . Diabetes Father   . Heart failure Brother     Past Medical History:  Diagnosis Date  . DM2 (diabetes mellitus, type 2) (HCC)   . Gangrene (HCC)    10+ years ago    Current Outpatient Medications  Medication Sig Dispense Refill  . atorvastatin (LIPITOR) 80 MG tablet TAKE 1 TABLET BY MOUTH DAILY. 90 tablet 3  . carvedilol (COREG) 12.5 MG tablet TAKE 1 TABLET BY MOUTH 2 TIMES DAILY WITH A MEAL. 180 tablet 3  . ELIQUIS 5 MG TABS tablet TAKE 1 TABLET BY MOUTH 2 TIMES DAILY. 180 tablet 3  . ENTRESTO 49-51 MG TAKE 1 TABLET BY MOUTH 2 (TWO) TIMES DAILY. 180 tablet 3  . furosemide (LASIX) 20 MG tablet Take 2 tablets in the AM and 1 tablet in the PM    . gabapentin (NEURONTIN) 300 MG capsule TAKE 1 CAPSULE BY MOUTH AT BEDTIME. 90 capsule 3  . insulin aspart (NOVOLOG) 100 UNIT/ML injection Inject 5-10 Units into the skin 3 (three) times daily before meals. Per sliding scale:    . insulin detemir (LEVEMIR) 100 UNIT/ML injection Inject 0.2 mLs (20 Units total) into the skin daily. 10 mL 11  . OVER THE COUNTER MEDICATION Place 1 drop into both eyes daily as needed (dry eyes). Over the counter lubricating eye drops from [employer]    . sitaGLIPtin (JANUVIA) 50 MG tablet Take  50 mg by mouth daily.    Marland Kitchen spironolactone (ALDACTONE) 25 MG tablet TAKE 1 TABLET BY MOUTH DAILY. 90 tablet 3  . Menthol, Topical Analgesic, (BIOFREEZE EX) Apply 1 application topically at bedtime.     No current facility-administered medications for this encounter.     Vitals:   12/31/17 1542  BP: 120/74  Pulse: 66  SpO2: 99%  Weight: 113.7 kg (250 lb 9.6 oz)   Wt Readings from Last 3 Encounters:  12/31/17 113.7 kg (250 lb 9.6 oz)  09/24/17 112.7 kg (248 lb 6 oz)  07/01/17 113.9 kg (251 lb)   PHYSICAL EXAM: General: NAD Neck: No JVD, no thyromegaly or thyroid nodule.  Lungs: Clear to auscultation bilaterally with normal respiratory effort. CV: Nondisplaced PMI.  Heart regular S1/S2, no S3/S4, no murmur.  1+ ankle edema.  No carotid bruit.  Normal pedal pulses.  Abdomen: Soft, nontender, no hepatosplenomegaly, no distention.  Skin: Intact without lesions or rashes.  Neurologic: Alert and oriented x 3.  Psych: Normal affect. Extremities: No clubbing or cyanosis.  HEENT: Normal.   ASSESSMENT & PLAN: 1. 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 S/p St Jude ICD.  No longer on Amiodarone.   2. Chronic systolic CHF: EF 16% with regional WMAs by cardiac MRI, ischemic cardiomyopathy. S/P CABG x 4.Echo today was reviewed, EF 30-35% with LAD-territory WMAs.  NYHA class II symptoms.  He is not volume overloaded on exam.   - Continue Lasix 40 qam/20 qpm, BMET today.  - Continue Coreg 12.5 mg BID.  - Continue Entresto 49/51mg  BID.   - Continue spironolactone 25 mg daily.  - I would like to see him on empagliflozin for his diabetes rather than sitagliptin.  When he runs out of current prescription of sitagliptin, he will stop it and start empagliflozin 10 mg daily.  3. CAD: Diffuse 3 vessel disease, see cath report above. cMRI showed that LAD territory did not appear to have significant viability. Remainder of LV myocardium did appear viable. He had severe disease  in LCx and RCA territory. s/p CABG x 4 in 1/17. Suspect ventricular fibrillation arrest 02/05/15 was scar-mediated as opposed to occlusion of graft, etc.  No chest pain.  - No ASA with apixaban use.  - Continue statin, good lipids 8/19.    4. LV mural thrombus: Seen on 1/17 cardiac MRI.  Not reported on most recent echo in 5/17.  - Continue apixaban 5 mg BID. Denies bleeding. 5. Hyperlipidemia: Good lipids in 5/19.  Continue statin.  6. CKD: Stage III, BMET today  7. DM2: He is on sitagliptin.  Given CHF, would consider another agent.  GFR > 30 is reasonable for empagliflozin, will arrange transition when he finishes current sitagliptin prescription.  8. Depression: Depressed mood, has little interaction with other people.  Not interested in antidepressant at this time.   Followup in 3 months.  [name] 01/02/2018

## 2018-01-11 ENCOUNTER — Telehealth: Payer: Self-pay | Admitting: Physician Assistant

## 2018-01-11 NOTE — Telephone Encounter (Signed)
Paged by answering service.  Patient started to having on and off shortness of breath with and without exertion since this morning.  He denies associated palpitation, chest pain/pressure, dizziness, lower extremity edema, melena or blood in his stool or urine.  He is states that he has gained 10 pounds since seen by Dr. Shirlee LatchMcLean about 10 days ago.  However, he denies lower extremity edema.  Symptoms not similar when he had a CABG.  Hard to determine etiology. Advised to take extra Lasix 20 mg this evening if no improvement come to ER for further evaluation. He denies ICD shock.  Patient agree with plan.

## 2018-01-24 ENCOUNTER — Ambulatory Visit (INDEPENDENT_AMBULATORY_CARE_PROVIDER_SITE_OTHER): Payer: PPO

## 2018-01-24 DIAGNOSIS — I255 Ischemic cardiomyopathy: Secondary | ICD-10-CM | POA: Diagnosis not present

## 2018-01-24 NOTE — Progress Notes (Signed)
Remote ICD transmission.   

## 2018-01-25 LAB — CUP PACEART REMOTE DEVICE CHECK
Battery Remaining Percentage: 78 %
Battery Voltage: 2.98 V
Brady Statistic RV Percent Paced: 2.8 %
HighPow Impedance: 55 Ohm
HighPow Impedance: 55 Ohm
Implantable Lead Location: 753860
Implantable Lead Model: 7122
Implantable Pulse Generator Implant Date: 20170608
Lead Channel Sensing Intrinsic Amplitude: 11.9 mV
Lead Channel Setting Pacing Amplitude: 2.5 V
Lead Channel Setting Pacing Pulse Width: 0.5 ms
Lead Channel Setting Sensing Sensitivity: 0.5 mV
MDC IDC LEAD IMPLANT DT: 20170608
MDC IDC MSMT BATTERY REMAINING LONGEVITY: 82 mo
MDC IDC MSMT LEADCHNL RV IMPEDANCE VALUE: 430 Ohm
MDC IDC MSMT LEADCHNL RV PACING THRESHOLD AMPLITUDE: 0.75 V
MDC IDC MSMT LEADCHNL RV PACING THRESHOLD PULSEWIDTH: 0.5 ms
MDC IDC PG SERIAL: 7359513
MDC IDC SESS DTM: 20191230070014

## 2018-02-14 ENCOUNTER — Other Ambulatory Visit (HOSPITAL_COMMUNITY): Payer: Self-pay | Admitting: Cardiology

## 2018-02-14 MED ORDER — EMPAGLIFLOZIN 10 MG PO TABS: 10.0000 mg | ORAL_TABLET | Freq: Every day | ORAL | 5 refills | Status: DC

## 2018-02-14 MED FILL — UNIFINE PENTIPS 31GX3/16": 31G X 5 MM | 75 days supply | Qty: 300 | Fill #4

## 2018-02-14 MED FILL — UNIFINE PENTIPS 31GX3/16: 31G X 5 MM | 75 days supply | Qty: 300 | Fill #4

## 2018-02-18 ENCOUNTER — Other Ambulatory Visit (HOSPITAL_COMMUNITY): Payer: Self-pay

## 2018-02-18 MED ORDER — EMPAGLIFLOZIN 10 MG PO TABS: 10.0000 mg | ORAL_TABLET | Freq: Every day | ORAL | 3 refills | Status: AC

## 2018-02-18 MED FILL — SPIRONOLACTONE 25 MG TABLET: 25 | 90 days supply | Qty: 90 | Fill #0

## 2018-02-18 MED FILL — LEVEMIR FLEXTOUCH 100 UNITS: 100 | 90 days supply | Qty: 18 | Fill #0

## 2018-02-18 MED FILL — ENTRESTO 49 MG-51 MG TABLET: 49-51 | 90 days supply | Qty: 180 | Fill #0

## 2018-02-18 MED FILL — CARVEDILOL 12.5 MG TABLET: 12.5 | 90 days supply | Qty: 180 | Fill #0

## 2018-02-18 MED FILL — ATORVASTATIN 80 MG TABLET: 80 | 90 days supply | Qty: 90 | Fill #0

## 2018-02-18 MED FILL — FUROSEMIDE 20 MG TABS: 20 | 90 days supply | Qty: 270 | Fill #0

## 2018-02-18 MED FILL — JARDIANCE 10 MG TABLET: 10 | 90 days supply | Qty: 90 | Fill #0

## 2018-02-18 MED FILL — ELIQUIS 5 MG TABLET: 5 | 90 days supply | Qty: 180 | Fill #0

## 2018-03-02 ENCOUNTER — Telehealth: Payer: Self-pay

## 2018-03-02 DIAGNOSIS — R404 Transient alteration of awareness: Secondary | ICD-10-CM | POA: Diagnosis not present

## 2018-03-02 DIAGNOSIS — I469 Cardiac arrest, cause unspecified: Secondary | ICD-10-CM | POA: Diagnosis not present

## 2018-03-02 NOTE — Telephone Encounter (Signed)
Call received from EMS.  Pt died at home from cardiac arrest (per EMS).  EMS unable to move body without signing physician for death certificate.  Pt has been seen by Dr. Ladona Ridgel and Heart Failure clinic.  Advised Dr. Ladona Ridgel would sign death certificate.  EMS will move body to nursing home.  Nursing home will send death certificate.

## 2018-03-03 DIAGNOSIS — I469 Cardiac arrest, cause unspecified: Secondary | ICD-10-CM | POA: Diagnosis not present

## 2018-03-03 DIAGNOSIS — R404 Transient alteration of awareness: Secondary | ICD-10-CM | POA: Diagnosis not present

## 2018-03-16 ENCOUNTER — Telehealth: Payer: Self-pay | Admitting: Internal Medicine

## 2018-03-16 NOTE — Telephone Encounter (Signed)
New Message   Marchelle Folks is calling because she needs the death certificate from Dr Ladona Ridgel.  Please call

## 2018-03-16 NOTE — Telephone Encounter (Signed)
Spoke with Marchelle Folks at [facility]. Informed her that Dr. Ladona Ridgel is in clinic and will sign as soon as he can.

## 2018-03-27 DIAGNOSIS — 419620001 Death: Secondary | SNOMED CT | POA: Diagnosis not present

## 2018-03-27 DEATH — deceased

## 2018-04-01 ENCOUNTER — Encounter (HOSPITAL_COMMUNITY): Payer: PPO | Admitting: Cardiology
